# Patient Record
Sex: Male | Born: 1940
Health system: Southern US, Community
[De-identification: ages and names within clinical notes are randomized; demographics above are authoritative.]

## PROBLEM LIST (undated history)

## (undated) DIAGNOSIS — M1990 Unspecified osteoarthritis, unspecified site: Secondary | ICD-10-CM

## (undated) DIAGNOSIS — N200 Calculus of kidney: Secondary | ICD-10-CM

## (undated) DIAGNOSIS — C801 Malignant (primary) neoplasm, unspecified: Secondary | ICD-10-CM

## (undated) DIAGNOSIS — R112 Nausea with vomiting, unspecified: Secondary | ICD-10-CM

## (undated) DIAGNOSIS — I639 Cerebral infarction, unspecified: Secondary | ICD-10-CM

## (undated) DIAGNOSIS — E789 Disorder of lipoprotein metabolism, unspecified: Secondary | ICD-10-CM

## (undated) DIAGNOSIS — R4701 Aphasia: Secondary | ICD-10-CM

## (undated) DIAGNOSIS — Z9889 Other specified postprocedural states: Secondary | ICD-10-CM

## (undated) HISTORY — PX: EYE SURGERY: SHX253

---

## 2003-07-16 ENCOUNTER — Ambulatory Visit (HOSPITAL_COMMUNITY): Admission: RE | Admit: 2003-07-16 | Discharge: 2003-07-16 | Payer: Self-pay | Admitting: Gastroenterology

## 2004-12-30 ENCOUNTER — Ambulatory Visit (HOSPITAL_COMMUNITY): Admission: RE | Admit: 2004-12-30 | Discharge: 2004-12-30 | Payer: Self-pay | Admitting: Neurosurgery

## 2009-11-25 ENCOUNTER — Encounter: Admission: RE | Admit: 2009-11-25 | Discharge: 2009-11-25 | Payer: Self-pay | Admitting: Family Medicine

## 2010-09-18 NOTE — Op Note (Signed)
NAME:  Antonio Manning, Antonio Manning                            ACCOUNT NO.:  [id number]   MEDICAL RECORD NO.:  [id number]                   PATIENT TYPE:  AMB   LOCATION:  ENDO                                 FACILITY:  [facility]   PHYSICIAN:  John C. Madilyn Fireman, M.D.                 DATE OF BIRTH:  1940-05-26   DATE OF PROCEDURE:  07/16/2003  DATE OF DISCHARGE:                                 OPERATIVE REPORT   PROCEDURE:  Colonoscopy.   INDICATIONS FOR PROCEDURE:  History of adenomatous colon polyps, due for  surveillance with last colonoscopy in 1998.   DESCRIPTION OF PROCEDURE:  The patient was placed in the left lateral  decubitus position and placed on the pulse monitor with continuous low-flow  oxygen delivered by nasal cannula.  He was sedated with 50 mcg IV fentanyl  and 6 mg IV Versed.  The Olympus video colonoscope was inserted into the  rectum and advanced to the cecum, confirmed by transillumination of  McBurney's point and visualization of the ileocecal valve and appendiceal  orifice.  Prep was excellent.  The cecum, ascending, transverse, descending,  and sigmoid colon all appeared normal with no masses, polyps, diverticula,  or other mucosal abnormalities.  The rectum likewise appeared normal and  retroflexed view of the anus revealed no obvious internal hemorrhoids.  The  scope was then withdrawn and the patient returned to the recovery room in  stable condition.  He tolerated the procedure well and there were no  immediate complications.   IMPRESSION:  Normal colonoscopy.   PLAN:  Repeat next surveillance colonoscopy in five years.                                               John C. Madilyn Fireman, M.D.    JCH/MEDQ  D:  07/16/2003  T:  07/16/2003  Job:  [id number]   cc:   Caryn Bee Manning. Little, M.D.  [address]  [place]  [place] [address]  Fax: [phone]

## 2010-12-09 ENCOUNTER — Ambulatory Visit: Payer: Medicare Other | Attending: Orthopedic Surgery | Admitting: Physical Therapy

## 2010-12-09 DIAGNOSIS — M545 Low back pain, unspecified: Secondary | ICD-10-CM | POA: Insufficient documentation

## 2010-12-09 DIAGNOSIS — M25559 Pain in unspecified hip: Secondary | ICD-10-CM | POA: Insufficient documentation

## 2010-12-09 DIAGNOSIS — M25659 Stiffness of unspecified hip, not elsewhere classified: Secondary | ICD-10-CM | POA: Insufficient documentation

## 2010-12-09 DIAGNOSIS — IMO0001 Reserved for inherently not codable concepts without codable children: Secondary | ICD-10-CM | POA: Insufficient documentation

## 2010-12-09 DIAGNOSIS — R262 Difficulty in walking, not elsewhere classified: Secondary | ICD-10-CM | POA: Insufficient documentation

## 2010-12-15 ENCOUNTER — Encounter: Payer: Self-pay | Admitting: Physical Therapy

## 2010-12-18 ENCOUNTER — Ambulatory Visit: Payer: Medicare Other | Admitting: Physical Therapy

## 2010-12-22 ENCOUNTER — Ambulatory Visit: Payer: Medicare Other | Admitting: Physical Therapy

## 2010-12-24 ENCOUNTER — Ambulatory Visit: Payer: Medicare Other | Admitting: Physical Therapy

## 2010-12-29 ENCOUNTER — Ambulatory Visit: Payer: Medicare Other | Admitting: Physical Therapy

## 2011-01-01 ENCOUNTER — Encounter: Payer: Self-pay | Admitting: Physical Therapy

## 2011-01-05 ENCOUNTER — Encounter: Payer: Self-pay | Admitting: Physical Therapy

## 2011-01-08 ENCOUNTER — Encounter: Payer: Self-pay | Admitting: Physical Therapy

## 2011-05-05 ENCOUNTER — Encounter (HOSPITAL_COMMUNITY): Payer: Self-pay | Admitting: Pharmacy Technician

## 2011-05-13 ENCOUNTER — Encounter (HOSPITAL_COMMUNITY): Payer: Self-pay

## 2011-05-13 ENCOUNTER — Encounter (HOSPITAL_COMMUNITY)
Admission: RE | Admit: 2011-05-13 | Discharge: 2011-05-13 | Disposition: A | Discharge status: Home / Self Care | Payer: Medicare Other | Source: Ambulatory Visit | Attending: Orthopedic Surgery | Admitting: Orthopedic Surgery

## 2011-05-13 DIAGNOSIS — M169 Osteoarthritis of hip, unspecified: Secondary | ICD-10-CM | POA: Diagnosis not present

## 2011-05-13 DIAGNOSIS — N200 Calculus of kidney: Secondary | ICD-10-CM

## 2011-05-13 DIAGNOSIS — C801 Malignant (primary) neoplasm, unspecified: Secondary | ICD-10-CM

## 2011-05-13 DIAGNOSIS — M25559 Pain in unspecified hip: Secondary | ICD-10-CM | POA: Diagnosis not present

## 2011-05-13 DIAGNOSIS — M1990 Unspecified osteoarthritis, unspecified site: Secondary | ICD-10-CM

## 2011-05-13 DIAGNOSIS — R112 Nausea with vomiting, unspecified: Secondary | ICD-10-CM

## 2011-05-13 DIAGNOSIS — E789 Disorder of lipoprotein metabolism, unspecified: Secondary | ICD-10-CM

## 2011-05-13 HISTORY — DX: Nausea with vomiting, unspecified: R11.2

## 2011-05-13 HISTORY — DX: Malignant (primary) neoplasm, unspecified: C80.1

## 2011-05-13 HISTORY — DX: Unspecified osteoarthritis, unspecified site: M19.90

## 2011-05-13 HISTORY — PX: BACK SURGERY: SHX140

## 2011-05-13 HISTORY — DX: Calculus of kidney: N20.0

## 2011-05-13 HISTORY — DX: Disorder of lipoprotein metabolism, unspecified: E78.9

## 2011-05-13 HISTORY — DX: Other specified postprocedural states: Z98.890

## 2011-05-13 HISTORY — PX: CATARACT EXTRACTION W/ INTRAOCULAR LENS IMPLANT: SHX1309

## 2011-05-13 LAB — DIFFERENTIAL
Basophils Absolute: 0 10*3/uL (ref 0.0–0.1)
Basophils Relative: 0 % (ref 0–1)
Eosinophils Absolute: 0.1 10*3/uL (ref 0.0–0.7)
Eosinophils Relative: 2 % (ref 0–5)
Lymphocytes Relative: 28 % (ref 12–46)

## 2011-05-13 LAB — APTT: aPTT: 32 seconds (ref 24–37)

## 2011-05-13 LAB — SURGICAL PCR SCREEN
MRSA, PCR: NEGATIVE
Staphylococcus aureus: POSITIVE — AB

## 2011-05-13 LAB — URINALYSIS, ROUTINE W REFLEX MICROSCOPIC
Bilirubin Urine: NEGATIVE
Glucose, UA: NEGATIVE mg/dL
Hgb urine dipstick: NEGATIVE
Ketones, ur: NEGATIVE mg/dL
Protein, ur: NEGATIVE mg/dL
Urobilinogen, UA: 0.2 mg/dL (ref 0.0–1.0)

## 2011-05-13 LAB — CBC
MCHC: 33.3 g/dL (ref 30.0–36.0)
MCV: 89.1 fL (ref 78.0–100.0)
Platelets: 258 10*3/uL (ref 150–400)
RDW: 13.2 % (ref 11.5–15.5)
WBC: 4.5 10*3/uL (ref 4.0–10.5)

## 2011-05-13 LAB — BASIC METABOLIC PANEL
CO2: 27 mEq/L (ref 19–32)
Calcium: 9.7 mg/dL (ref 8.4–10.5)
Glucose, Bld: 106 mg/dL — ABNORMAL HIGH (ref 70–99)

## 2011-05-13 LAB — PROTIME-INR: Prothrombin Time: 13 seconds (ref 11.6–15.2)

## 2011-05-13 MED ORDER — CHLORHEXIDINE GLUCONATE 4 % EX LIQD: 60.0000 mL | Freq: Once | CUTANEOUS | Status: DC

## 2011-05-13 NOTE — Patient Instructions (Signed)
20 Antonio Manning  05/13/2011   Your procedure is scheduled on:  05-20-11  Report to [facility] at  0515 AM.  Call this number if you have problems the morning of surgery: [phone]   Remember:   Do not eat food:After Midnight.  May have clear liquids:until Midnight .  Clear liquids include soda, tea, black coffee, apple or grape juice, broth.  Take these medicines the morning of surgery with A SIP OF WATER: none  Do not wear jewelry, make-up or nail polish.  Do not wear lotions, powders, or perfumes. You may wear deodorant.  Do not shave 48 hours prior to surgery.  Do not bring valuables to the hospital.  Contacts, dentures or bridgework may not be worn into surgery.  Leave suitcase in the car. After surgery it may be brought to your room.  For patients admitted to the hospital, checkout time is 11:00 AM the day of discharge.   Patients discharged the day of surgery will not be allowed to drive home.  Name and phone number of your driver: Antonio Manning ZOXW,[phone]  Special Instructions: Incentive Spirometry - Practice and bring it with you on the day of surgery. and CHG Shower Use Special Wash: 1/2 bottle night before surgery and 1/2 bottle morning of surgery.   Please read over the following fact sheets that you were given: Blood Transfusion Information and MRSA Information

## 2011-05-13 NOTE — Pre-Procedure Instructions (Signed)
05-13-11 EKG 05-06-11 with chart, surgical clearance note with chart-Dr. Kirtland Bouchard. Little.

## 2011-05-13 NOTE — H&P (Signed)
Antonio Manning is an 71 y.o. male.    Chief Complaint: left hip OA and pain   HPI: Pt is a 71 y.o. male complaining of left hip pain for a couple of years years. Pain had continually increased since the beginning. X-rays in the clinic show end-stage arthritic changes of the left hip. Pt has tried various conservative treatments which have failed to alleviate their symptoms including PT and NSAIDs. Various options are discussed with the patient. Risks, benefits and expectations were discussed with the patient. Patient understand the risks, benefits and expectations and wishes to proceed with surgery.   PCP:  Dr. Catha Gosselin  D/C Plans:  Home with HHPT  Post-op Meds:  Rx given for  ASA, Robaxin, Celebrex, Iron, Colace and MiraLax  Tranexamic Acid:  To be given  PMH: Past Medical History  Diagnosis Date  . Cholesterol serum elevated 2011-05-23    tx. Lipitor  . Cataract May 23, 2011    right  . PONV (postoperative nausea and vomiting) May 23, 2011    with cataract surgery  . Kidney calculi 05/23/2011    past hx. x1-passed on own, mild prostate issues-no meds  . Arthritis 05-23-2011    osteoarthritis,lt. hip, and neck area  . Cancer 05-23-2011    skin cancer lesions-multiple  . Hearing loss 05/23/11    wears hearing aids bilaterally    PSH: Past Surgical History  Procedure Date  . Back surgery 23-May-2011    Lumbar disc surgery-no hardware  . Cataract extraction w/ intraocular lens implant 2011-05-23    left eye    Social History:  reports that he quit smoking about 23 years ago. His smoking use included Cigarettes. He does not have any smokeless tobacco history on file. He reports that he does not drink alcohol or use illicit drugs.  Allergies:  Allergies  Allergen Reactions  . Codeine Nausea And Vomiting    Medications: 1. Lipitor 10 mg one PO daily 2. Celebrex 200 mg one PO daily 3. Aspirin 81 mg one PO daily 4. Multivitamin one PO daily 5. Calcium +D one PO daily 6. Glucosamine and  chondroitin one PO daily     Results for orders placed during the hospital encounter of 2011-05-23 (from the past 48 hour(s))  SURGICAL PCR SCREEN     Status: Abnormal   Collection Time   05-23-11  9:29 AM      Component Value Range Comment   MRSA, PCR NEGATIVE  NEGATIVE     Staphylococcus aureus POSITIVE (*) NEGATIVE    URINALYSIS, ROUTINE W REFLEX MICROSCOPIC     Status: Normal   Collection Time   2011/05/23  9:30 AM      Component Value Range Comment   Color, Urine YELLOW  YELLOW     APPearance CLEAR  CLEAR     Specific Gravity, Urine 1.021  1.005 - 1.030     pH 5.0  5.0 - 8.0     Glucose, UA NEGATIVE  NEGATIVE (mg/dL)    Hgb urine dipstick NEGATIVE  NEGATIVE     Bilirubin Urine NEGATIVE  NEGATIVE     Ketones, ur NEGATIVE  NEGATIVE (mg/dL)    Protein, ur NEGATIVE  NEGATIVE (mg/dL)    Urobilinogen, UA 0.2  0.0 - 1.0 (mg/dL)    Nitrite NEGATIVE  NEGATIVE     Leukocytes, UA NEGATIVE  NEGATIVE  MICROSCOPIC NOT DONE ON URINES WITH NEGATIVE PROTEIN, BLOOD, LEUKOCYTES, NITRITE, OR GLUCOSE <1000 mg/dL.  APTT     Status: Normal  Collection Time   05/13/11 10:10 AM      Component Value Range Comment   aPTT 32  24 - 37 (seconds)   BASIC METABOLIC PANEL     Status: Abnormal   Collection Time   05/13/11 10:10 AM      Component Value Range Comment   Sodium 139  135 - 145 (mEq/L)    Potassium 4.4  3.5 - 5.1 (mEq/L)    Chloride 104  96 - 112 (mEq/L)    CO2 27  19 - 32 (mEq/L)    Glucose, Bld 106 (*) 70 - 99 (mg/dL)    BUN 18  6 - 23 (mg/dL)    Creatinine, Ser 9.60  0.50 - 1.35 (mg/dL)    Calcium 9.7  8.4 - 10.5 (mg/dL)    GFR calc non Af Amer 87 (*) >90 (mL/min)    GFR calc Af Amer >90  >90 (mL/min)   CBC     Status: Normal   Collection Time   05/13/11 10:10 AM      Component Value Range Comment   WBC 4.5  4.0 - 10.5 (K/uL)    RBC 4.59  4.22 - 5.81 (MIL/uL)    Hemoglobin 13.6  13.0 - 17.0 (g/dL)    HCT 45.4  09.8 - 11.9 (%)    MCV 89.1  78.0 - 100.0 (fL)    MCH 29.6  26.0 - 34.0  (pg)    MCHC 33.3  30.0 - 36.0 (g/dL)    RDW 14.7  82.9 - 56.2 (%)    Platelets 258  150 - 400 (K/uL)   DIFFERENTIAL     Status: Normal   Collection Time   05/13/11 10:10 AM      Component Value Range Comment   Neutrophils Relative 62  43 - 77 (%)    Neutro Abs 2.8  1.7 - 7.7 (K/uL)    Lymphocytes Relative 28  12 - 46 (%)    Lymphs Abs 1.2  0.7 - 4.0 (K/uL)    Monocytes Relative 8  3 - 12 (%)    Monocytes Absolute 0.4  0.1 - 1.0 (K/uL)    Eosinophils Relative 2  0 - 5 (%)    Eosinophils Absolute 0.1  0.0 - 0.7 (K/uL)    Basophils Relative 0  0 - 1 (%)    Basophils Absolute 0.0  0.0 - 0.1 (K/uL)   PROTIME-INR     Status: Normal   Collection Time   05/13/11 10:10 AM      Component Value Range Comment   Prothrombin Time 13.0  11.6 - 15.2 (seconds)    INR 0.96  0.00 - 1.49       ROS: Review of Systems  Constitutional: Negative.   HENT: Positive for hearing loss.   Eyes: Negative.   Respiratory: Positive for shortness of breath (on exertion).   Cardiovascular: Negative.   Gastrointestinal: Positive for heartburn and diarrhea.  Genitourinary: Positive for urgency and frequency.  Musculoskeletal: Positive for myalgias, back pain and joint pain. Negative for falls.  Skin: Negative.   Neurological: Positive for dizziness and headaches.  Endo/Heme/Allergies: Negative.   Psychiatric/Behavioral: Negative.      Physican Exam: BP : 130/84 ; Pulse : 60 ; Resp : 14 ; Physical Exam  Constitutional: He is oriented to person, place, and time and well-developed, well-nourished, and in no distress.  HENT:  Head: Normocephalic and atraumatic.  Nose: Nose normal.  Mouth/Throat: Oropharynx is clear and moist.  Eyes: Pupils  are equal, round, and reactive to light.  Neck: Neck supple. No JVD present. No tracheal deviation present. No thyromegaly present.  Cardiovascular: Normal rate, regular rhythm, normal heart sounds and intact distal pulses.   Pulmonary/Chest: Effort normal and breath  sounds normal. No stridor. No respiratory distress. He has no wheezes. He has no rales. He exhibits no tenderness.  Abdominal: Soft. There is no tenderness. There is no guarding.  Musculoskeletal:       Right shoulder: He exhibits decreased range of motion (mild decrease with flexion and abduction; significant decrease with internal and external rotation), bony tenderness, pain and decreased strength. He exhibits no tenderness, no swelling, no effusion, no deformity, no laceration and normal pulse.  Lymphadenopathy:    He has no cervical adenopathy.  Neurological: He is alert and oriented to person, place, and time.  Skin: Skin is warm and dry. No rash noted. No erythema. No pallor.  Psychiatric: Affect normal.      Assessment/Plan Assessment: left hip OA and pain   Plan: Patient will undergo a left total hip arthroplasty, anterior approach on 05/20/2011. Risks benefits and expectation were discussed with the patient. Patient understand risks, benefits and expectation and wishes to proceed.   Anastasio Auerbach Chesney Suares   PAC  05/13/2011, 4:37 PM

## 2011-05-20 ENCOUNTER — Encounter (HOSPITAL_COMMUNITY)
Admission: RE | Disposition: A | Discharge status: Home Health | Payer: Self-pay | Source: Ambulatory Visit | Attending: Orthopedic Surgery

## 2011-05-20 ENCOUNTER — Inpatient Hospital Stay (HOSPITAL_COMMUNITY)
Admission: RE | Admit: 2011-05-20 | Discharge: 2011-05-22 | DRG: 470 | Disposition: A | Discharge status: Home Health | Payer: Medicare Other | Source: Ambulatory Visit | Attending: Orthopedic Surgery | Admitting: Orthopedic Surgery

## 2011-05-20 ENCOUNTER — Inpatient Hospital Stay (HOSPITAL_COMMUNITY): Payer: Medicare Other | Admitting: Anesthesiology

## 2011-05-20 ENCOUNTER — Encounter (HOSPITAL_COMMUNITY): Payer: Self-pay

## 2011-05-20 ENCOUNTER — Inpatient Hospital Stay (HOSPITAL_COMMUNITY): Payer: Medicare Other

## 2011-05-20 ENCOUNTER — Encounter (HOSPITAL_COMMUNITY): Payer: Self-pay | Admitting: Anesthesiology

## 2011-05-20 DIAGNOSIS — M25559 Pain in unspecified hip: Secondary | ICD-10-CM | POA: Diagnosis not present

## 2011-05-20 DIAGNOSIS — E7800 Pure hypercholesterolemia, unspecified: Secondary | ICD-10-CM | POA: Diagnosis not present

## 2011-05-20 DIAGNOSIS — M169 Osteoarthritis of hip, unspecified: Secondary | ICD-10-CM | POA: Diagnosis not present

## 2011-05-20 DIAGNOSIS — M1610 Unilateral primary osteoarthritis, unspecified hip: Principal | ICD-10-CM | POA: Diagnosis present

## 2011-05-20 DIAGNOSIS — Z01812 Encounter for preprocedural laboratory examination: Secondary | ICD-10-CM | POA: Diagnosis not present

## 2011-05-20 DIAGNOSIS — Z471 Aftercare following joint replacement surgery: Secondary | ICD-10-CM | POA: Diagnosis not present

## 2011-05-20 DIAGNOSIS — Z96649 Presence of unspecified artificial hip joint: Secondary | ICD-10-CM | POA: Diagnosis not present

## 2011-05-20 HISTORY — PX: TOTAL HIP ARTHROPLASTY: SHX124

## 2011-05-20 LAB — ABO/RH: ABO/RH(D): A POS

## 2011-05-20 SURGERY — ARTHROPLASTY, HIP, TOTAL, ANTERIOR APPROACH
Anesthesia: General | Site: Hip | Laterality: Left | Wound class: Clean

## 2011-05-20 MED ORDER — RIVAROXABAN 10 MG PO TABS
10.0000 mg | ORAL_TABLET | Freq: Every day | ORAL | Status: DC
  Administered 2011-05-21 – 2011-05-22 (×2): 10 mg via ORAL
  Filled 2011-05-20 (×2): qty 1

## 2011-05-20 MED ORDER — TRANEXAMIC ACID 100 MG/ML IV SOLN
1110.0000 mg | Freq: Once | INTRAVENOUS | Status: AC
  Administered 2011-05-20: 1110 mg via INTRAVENOUS
  Filled 2011-05-20: qty 11.1

## 2011-05-20 MED ORDER — GLYCOPYRROLATE 0.2 MG/ML IJ SOLN
INTRAMUSCULAR | Status: DC | PRN
  Administered 2011-05-20: .8 mg via INTRAVENOUS

## 2011-05-20 MED ORDER — NEOSTIGMINE METHYLSULFATE 1 MG/ML IJ SOLN
INTRAMUSCULAR | Status: DC | PRN
  Administered 2011-05-20: 5 mg via INTRAVENOUS

## 2011-05-20 MED ORDER — METOCLOPRAMIDE HCL 5 MG/ML IJ SOLN
5.0000 mg | Freq: Three times a day (TID) | INTRAMUSCULAR | Status: DC | PRN
  Administered 2011-05-20: 10 mg via INTRAVENOUS

## 2011-05-20 MED ORDER — PHENOL 1.4 % MT LIQD
1.0000 | OROMUCOSAL | Status: DC | PRN
  Filled 2011-05-20: qty 177

## 2011-05-20 MED ORDER — ACETAMINOPHEN 650 MG RE SUPP: 650.0000 mg | Freq: Four times a day (QID) | RECTAL | Status: DC | PRN

## 2011-05-20 MED ORDER — ACETAMINOPHEN 325 MG PO TABS
650.0000 mg | ORAL_TABLET | Freq: Four times a day (QID) | ORAL | Status: DC | PRN
  Administered 2011-05-21 (×2): 650 mg via ORAL
  Filled 2011-05-20 (×2): qty 2

## 2011-05-20 MED ORDER — SIMVASTATIN 20 MG PO TABS
20.0000 mg | ORAL_TABLET | Freq: Every day | ORAL | Status: DC
  Administered 2011-05-21 – 2011-05-22 (×2): 20 mg via ORAL
  Filled 2011-05-20 (×3): qty 1

## 2011-05-20 MED ORDER — POLYETHYLENE GLYCOL 3350 17 G PO PACK
17.0000 g | PACK | Freq: Every day | ORAL | Status: DC
  Administered 2011-05-22: 17 g via ORAL
  Filled 2011-05-20 (×3): qty 1

## 2011-05-20 MED ORDER — FENTANYL CITRATE 0.05 MG/ML IJ SOLN
INTRAMUSCULAR | Status: DC | PRN
  Administered 2011-05-20 (×2): 100 ug via INTRAVENOUS
  Administered 2011-05-20: 50 ug via INTRAVENOUS

## 2011-05-20 MED ORDER — SCOPOLAMINE 1 MG/3DAYS TD PT72
MEDICATED_PATCH | TRANSDERMAL | Status: DC | PRN
  Administered 2011-05-20: 1.5 mg via TRANSDERMAL

## 2011-05-20 MED ORDER — CALCIUM CARBONATE-VITAMIN D 500-200 MG-UNIT PO TABS
1.0000 | ORAL_TABLET | Freq: Two times a day (BID) | ORAL | Status: DC
  Administered 2011-05-20 – 2011-05-22 (×4): 1 via ORAL
  Filled 2011-05-20 (×6): qty 1

## 2011-05-20 MED ORDER — PROPOFOL 10 MG/ML IV EMUL
INTRAVENOUS | Status: DC | PRN
  Administered 2011-05-20: 150 mg via INTRAVENOUS

## 2011-05-20 MED ORDER — CEFAZOLIN SODIUM 1-5 GM-% IV SOLN
1.0000 g | INTRAVENOUS | Status: AC
  Administered 2011-05-20: 1 g via INTRAVENOUS

## 2011-05-20 MED ORDER — CEFAZOLIN SODIUM 1-5 GM-% IV SOLN
1.0000 g | Freq: Four times a day (QID) | INTRAVENOUS | Status: AC
  Administered 2011-05-20 – 2011-05-21 (×3): 1 g via INTRAVENOUS
  Filled 2011-05-20 (×3): qty 50

## 2011-05-20 MED ORDER — HYDROCODONE-ACETAMINOPHEN 5-325 MG PO TABS
1.0000 | ORAL_TABLET | ORAL | Status: DC | PRN
  Administered 2011-05-20 (×2): 1 via ORAL
  Filled 2011-05-20: qty 2
  Filled 2011-05-20: qty 1

## 2011-05-20 MED ORDER — ACETAMINOPHEN 10 MG/ML IV SOLN
INTRAVENOUS | Status: DC | PRN
  Administered 2011-05-20: 1000 mg via INTRAVENOUS

## 2011-05-20 MED ORDER — ALUM & MAG HYDROXIDE-SIMETH 200-200-20 MG/5ML PO SUSP
30.0000 mL | ORAL | Status: DC | PRN
  Administered 2011-05-21: 30 mL via ORAL
  Filled 2011-05-20: qty 30

## 2011-05-20 MED ORDER — ROCURONIUM BROMIDE 100 MG/10ML IV SOLN
INTRAVENOUS | Status: DC | PRN
  Administered 2011-05-20: 50 mg via INTRAVENOUS
  Administered 2011-05-20: 20 mg via INTRAVENOUS
  Administered 2011-05-20: 10 mg via INTRAVENOUS

## 2011-05-20 MED ORDER — LIDOCAINE HCL (CARDIAC) 20 MG/ML IV SOLN
INTRAVENOUS | Status: DC | PRN
  Administered 2011-05-20: 100 mg via INTRAVENOUS

## 2011-05-20 MED ORDER — HYDROMORPHONE HCL PF 1 MG/ML IJ SOLN
INTRAMUSCULAR | Status: DC | PRN
Start: 2011-05-20 — End: 2011-05-20
  Administered 2011-05-20 (×2): 1 mg via INTRAVENOUS

## 2011-05-20 MED ORDER — PROMETHAZINE HCL 25 MG/ML IJ SOLN: 6.2500 mg | INTRAMUSCULAR | Status: DC | PRN

## 2011-05-20 MED ORDER — ADULT MULTIVITAMIN W/MINERALS CH
1.0000 | ORAL_TABLET | Freq: Every day | ORAL | Status: DC
  Administered 2011-05-21 – 2011-05-22 (×2): 1 via ORAL
  Filled 2011-05-20 (×3): qty 1

## 2011-05-20 MED ORDER — DEXAMETHASONE SODIUM PHOSPHATE 10 MG/ML IJ SOLN: 10.0000 mg | Freq: Once | INTRAMUSCULAR | Status: DC

## 2011-05-20 MED ORDER — METHOCARBAMOL 100 MG/ML IJ SOLN
500.0000 mg | Freq: Four times a day (QID) | INTRAMUSCULAR | Status: DC | PRN
  Administered 2011-05-20: 500 mg via INTRAVENOUS
  Filled 2011-05-20 (×2): qty 5

## 2011-05-20 MED ORDER — ASPIRIN EC 81 MG PO TBEC
81.0000 mg | DELAYED_RELEASE_TABLET | Freq: Every day | ORAL | Status: DC
  Administered 2011-05-20 – 2011-05-21 (×2): 81 mg via ORAL
  Filled 2011-05-20 (×3): qty 1

## 2011-05-20 MED ORDER — LACTATED RINGERS IV SOLN: INTRAVENOUS | Status: DC

## 2011-05-20 MED ORDER — HETASTARCH-ELECTROLYTES 6 % IV SOLN
INTRAVENOUS | Status: DC | PRN
  Administered 2011-05-20: 09:00:00 via INTRAVENOUS

## 2011-05-20 MED ORDER — ONDANSETRON HCL 4 MG/2ML IJ SOLN
INTRAMUSCULAR | Status: DC | PRN
  Administered 2011-05-20: 4 mg via INTRAVENOUS

## 2011-05-20 MED ORDER — HYDROMORPHONE HCL PF 1 MG/ML IJ SOLN
0.2500 mg | INTRAMUSCULAR | Status: DC | PRN
  Administered 2011-05-20 (×3): 0.25 mg via INTRAVENOUS

## 2011-05-20 MED ORDER — ONDANSETRON HCL 4 MG PO TABS
4.0000 mg | ORAL_TABLET | Freq: Four times a day (QID) | ORAL | Status: DC | PRN
  Administered 2011-05-22: 4 mg via ORAL
  Filled 2011-05-20: qty 1

## 2011-05-20 MED ORDER — METHOCARBAMOL 500 MG PO TABS
500.0000 mg | ORAL_TABLET | Freq: Four times a day (QID) | ORAL | Status: DC | PRN
  Administered 2011-05-20: 500 mg via ORAL
  Filled 2011-05-20: qty 1

## 2011-05-20 MED ORDER — MENTHOL 3 MG MT LOZG
1.0000 | LOZENGE | OROMUCOSAL | Status: DC | PRN
  Filled 2011-05-20: qty 9

## 2011-05-20 MED ORDER — METOCLOPRAMIDE HCL 5 MG PO TABS
5.0000 mg | ORAL_TABLET | Freq: Three times a day (TID) | ORAL | Status: DC | PRN
  Filled 2011-05-20: qty 2

## 2011-05-20 MED ORDER — SODIUM CHLORIDE 0.9 % IV SOLN
INTRAVENOUS | Status: DC
  Administered 2011-05-20: 22:00:00 via INTRAVENOUS
  Filled 2011-05-20 (×7): qty 1000

## 2011-05-20 MED ORDER — FERROUS SULFATE 325 (65 FE) MG PO TABS
325.0000 mg | ORAL_TABLET | Freq: Three times a day (TID) | ORAL | Status: DC
  Administered 2011-05-21 – 2011-05-22 (×4): 325 mg via ORAL
  Filled 2011-05-20 (×8): qty 1

## 2011-05-20 MED ORDER — MIDAZOLAM HCL 5 MG/5ML IJ SOLN
INTRAMUSCULAR | Status: DC | PRN
  Administered 2011-05-20: 2 mg via INTRAVENOUS

## 2011-05-20 MED ORDER — DOCUSATE SODIUM 100 MG PO CAPS
100.0000 mg | ORAL_CAPSULE | Freq: Two times a day (BID) | ORAL | Status: DC
  Administered 2011-05-20 – 2011-05-22 (×4): 100 mg via ORAL
  Filled 2011-05-20 (×6): qty 1

## 2011-05-20 MED ORDER — METOCLOPRAMIDE HCL 5 MG/ML IJ SOLN
INTRAMUSCULAR | Status: AC
  Filled 2011-05-20: qty 2

## 2011-05-20 MED ORDER — ONDANSETRON HCL 4 MG/2ML IJ SOLN
4.0000 mg | Freq: Four times a day (QID) | INTRAMUSCULAR | Status: DC | PRN
  Administered 2011-05-21: 4 mg via INTRAVENOUS
  Filled 2011-05-20: qty 2

## 2011-05-20 MED ORDER — LACTATED RINGERS IV SOLN
INTRAVENOUS | Status: DC | PRN
  Administered 2011-05-20 (×2): via INTRAVENOUS

## 2011-05-20 MED ORDER — DEXAMETHASONE SODIUM PHOSPHATE 10 MG/ML IJ SOLN
INTRAMUSCULAR | Status: DC | PRN
  Administered 2011-05-20: 10 mg via INTRAVENOUS

## 2011-05-20 SURGICAL SUPPLY — 42 items
ADH SKN CLS APL DERMABOND .7 (GAUZE/BANDAGES/DRESSINGS) ×1
BAG SPEC THK2 15X12 ZIP CLS (MISCELLANEOUS) ×1
BAG ZIPLOCK 12X15 (MISCELLANEOUS) ×3 IMPLANT
BLADE SAW SGTL 18X1.27X75 (BLADE) ×2 IMPLANT
CELLS DAT CNTRL 66122 CELL SVR (MISCELLANEOUS) ×1 IMPLANT
CLOTH BEACON ORANGE TIMEOUT ST (SAFETY) ×2 IMPLANT
DERMABOND ADVANCED (GAUZE/BANDAGES/DRESSINGS) ×1
DERMABOND ADVANCED .7 DNX12 (GAUZE/BANDAGES/DRESSINGS) ×1 IMPLANT
DRAPE C-ARM 42X72 X-RAY (DRAPES) ×2 IMPLANT
DRAPE STERI IOBAN 125X83 (DRAPES) ×2 IMPLANT
DRAPE U-SHAPE 47X51 STRL (DRAPES) ×6 IMPLANT
DRSG AQUACEL AG ADV 3.5X10 (GAUZE/BANDAGES/DRESSINGS) ×2 IMPLANT
DRSG TEGADERM 4X4.75 (GAUZE/BANDAGES/DRESSINGS) ×1 IMPLANT
DURAPREP 26ML APPLICATOR (WOUND CARE) ×2 IMPLANT
ELECT BLADE TIP CTD 4 INCH (ELECTRODE) ×2 IMPLANT
ELECT REM PT RETURN 9FT ADLT (ELECTROSURGICAL) ×2
ELECTRODE REM PT RTRN 9FT ADLT (ELECTROSURGICAL) ×1 IMPLANT
EVACUATOR 1/8 PVC DRAIN (DRAIN) ×1 IMPLANT
FACESHIELD LNG OPTICON STERILE (SAFETY) ×8 IMPLANT
GAUZE SPONGE 2X2 8PLY STRL LF (GAUZE/BANDAGES/DRESSINGS) ×1 IMPLANT
GLOVE BIOGEL PI IND STRL 7.5 (GLOVE) ×1 IMPLANT
GLOVE BIOGEL PI IND STRL 8 (GLOVE) ×1 IMPLANT
GLOVE BIOGEL PI INDICATOR 7.5 (GLOVE) ×1
GLOVE BIOGEL PI INDICATOR 8 (GLOVE) ×1
GLOVE ECLIPSE 8.0 STRL XLNG CF (GLOVE) ×2 IMPLANT
GLOVE ORTHO TXT STRL SZ7.5 (GLOVE) ×4 IMPLANT
GOWN STRL NON-REIN LRG LVL3 (GOWN DISPOSABLE) ×2 IMPLANT
IMMOBILIZER KNEE 20 (SOFTGOODS) ×2
IMMOBILIZER KNEE 20 THIGH 36 (SOFTGOODS) IMPLANT
KIT BASIN OR (CUSTOM PROCEDURE TRAY) ×2 IMPLANT
PACK TOTAL JOINT (CUSTOM PROCEDURE TRAY) ×2 IMPLANT
PADDING CAST COTTON 6X4 STRL (CAST SUPPLIES) ×2 IMPLANT
RETRACTOR WND ALEXIS 18 MED (MISCELLANEOUS) ×1 IMPLANT
RTRCTR WOUND ALEXIS 18CM MED (MISCELLANEOUS) ×2
SPONGE GAUZE 2X2 STER 10/PKG (GAUZE/BANDAGES/DRESSINGS) ×1
SUCTION FRAZIER 12FR DISP (SUCTIONS) ×2 IMPLANT
SUT MNCRL AB 4-0 PS2 18 (SUTURE) ×2 IMPLANT
SUT VIC AB 1 CT1 36 (SUTURE) ×9 IMPLANT
SUT VIC AB 2-0 CT1 27 (SUTURE) ×4
SUT VIC AB 2-0 CT1 TAPERPNT 27 (SUTURE) ×2 IMPLANT
TOWEL OR 17X26 10 PK STRL BLUE (TOWEL DISPOSABLE) ×4 IMPLANT
TRAY FOLEY CATH 14FRSI W/METER (CATHETERS) ×2 IMPLANT

## 2011-05-20 NOTE — Op Note (Signed)
NAME:  JOSEL KEO                ACCOUNT NO.: [id number]      MEDICAL RECORD NO.: [id number]      FACILITY:  [facility]      PHYSICIAN:  Durene Romans D  DATE OF BIRTH:  [birth year]     DATE OF PROCEDURE:  05/20/2011                                 OPERATIVE REPORT         PREOPERATIVE DIAGNOSIS: Left  hip osteoarthritis.      POSTOPERATIVE DIAGNOSIS:  Left osteoarthritis.      PROCEDURE:  Left total hip replacement through an anterior approach   utilizing DePuy THR system, component size 58 pinnacle cup, a size 36+4 neutral   Altrex liner, a size 6Hi Tri Lock stem with a 36+1.5 delta ceramic   ball.      SURGEON:  Madlyn Frankel. Charlann Boxer, M.D.      ASSISTANT:  Lanney Gins, PA      ANESTHESIA:  Regional.      SPECIMENS:  None.      COMPLICATIONS:  None.      BLOOD LOSS:  500 cc     DRAINS:  One Hemovac.      INDICATION OF THE PROCEDURE:  CAP MASSI is a 71 y.o. male who had   presented to office for evaluation of left hip pain.  Radiographs revealed   progressive degenerative changes with bone-on-bone   articulation to the  hip joint.  The patient had painful limited range of   motion significantly affecting their overall quality of life.  The patient was failing to    respond to conservative measures, and at this point was ready   to proceed with more definitive measures.  The patient has noted progressive   degenerative changes in his hip, progressive problems and dysfunction   with regarding the hip prior to surgery.  Consent was obtained for   benefit of pain relief.  Specific risk of infection, DVT, component   failure, dislocation, need for revision surgery, as well discussion of   the anterior versus posterior approach were reviewed.  Consent was   obtained for benefit of anterior pain relief through an anterior   approach.      PROCEDURE IN DETAIL:  The patient was brought to operative theater.   Once adequate anesthesia, preoperative antibiotics, 2 gm Ancef  administered.   The patient was positioned supine on the OSI Hanna table.  Once adequate   padding of boney process was carried out, we had predraped out the hip, and  used fluoroscopy to confirm orientation of the pelvis and position.      The left hip was then prepped and draped from proximal iliac crest to   mid thigh with shower curtain technique.      Time-out was performed identifying the patient, planned procedure, and   extremity.     An incision was then made 2 cm distal and lateral to the   anterior superior iliac spine extending over the orientation of the   tensor fascia lata muscle and sharp dissection was carried down to the   fascia of the muscle and protractor placed in the soft tissues.      The fascia was then incised.  The muscle belly was identified and swept  laterally and retractor placed along the superior neck.  Following   cauterization of the circumflex vessels and removing some pericapsular   fat, a second cobra retractor was placed on the inferior neck.  A third   retractor was placed on the anterior acetabulum after elevating the   anterior rectus.  A L-capsulotomy was along the line of the   superior neck to the trochanteric fossa, then extended proximally and   distally.  Tag sutures were placed and the retractors were then placed   intracapsular.  We then identified the trochanteric fossa and   orientation of my neck cut, confirmed this radiographically   and then made a neck osteotomy with the femur on traction.  The femoral   head was removed without difficulty or complication.  Traction was let   off and retractors were placed posterior and anterior around the   acetabulum.      The labrum and foveal tissue were debrided.  I began reaming with a 48mm   reamer and reamed up to 57mm reamer with good bony bed preparation and a 58   cup was chosen.  The final 58 Pinnacle cup was then impacted under fluoroscopy  to confirm the depth of penetration and  orientation with respect to   abduction.  A screw was placed followed by the hole eliminator.  The final   36+4 Altrex liner was impacted with good visualized rim fit.  The cup was positioned anatomically within the acetabular portion of the pelvis.      At this point, the femur was rolled at 80 degrees.  Further capsule was   released off the inferior aspect of the femoral neck.  I then   released the superior capsule proximally.  The hook was placed laterally   along the femur and elevated manually and held in position with the bed   hook.  The leg was then extended and adducted with the leg rolled to 100   degrees of external rotation.  Once the proximal femur was fully   exposed, I used a box osteotome to set orientation.  I then began   broaching with the starting chili pepper broach and passed this by hand and then broached up to 6.  With the 6 broach in place I chose a high offset neck and did a trial reduction.  The offset was appropriate, leg lengths   appeared to be equal, confirmed radiographically.   Given these findings, I went ahead and dislocated the hip, repositioned all   retractors and positioned the right hip in the extended and abducted position.  The final 6Hi  Tri Lock stem was   chosen and it was impacted down to the level of neck cut.  Based on this   and the trial reduction, a 36+1.5 delta ceramic ball was chosen and   impacted onto a clean and dry trunnion, and the hip was reduced.  The   hip had been irrigated throughout the case again at this point.  I did   reapproximate the superior capsular leaflet to the anterior leaflet   using #1 Vicryl, placed a medium Hemovac drain deep.  The fascia of the   tensor fascia lata muscle was then reapproximated using #1 Vicryl.  The   remaining wound was closed with 2-0 Vicryl and running 4-0 Monocryl.   The hip was cleaned, dried, and dressed sterilely using Dermabond and   Aquacel dressing.  Drain site dressed separately.   She was then  brought   to recovery room in stable condition tolerating the procedure well.    Lanney Gins, PA-C was present for the entirety of the case involved from   preoperative positioning, perioperative retractor management, general   facilitation of the case, as well as primary wound closure as assistant.            Madlyn Frankel Charlann Boxer, M.D.            MDO/MEDQ  D:  02/23/2011  T:  02/23/2011  Job:  [id number]      Electronically Signed by Durene Romans M.D. on 03/01/2011 09:15:38 AM

## 2011-05-20 NOTE — Anesthesia Postprocedure Evaluation (Signed)
  Anesthesia Post-op Note  Patient: Antonio Manning  Procedure(s) Performed:  TOTAL HIP ARTHROPLASTY ANTERIOR APPROACH  Patient Location: PACU  Anesthesia Type: General  Level of Consciousness: oriented and sedated  Airway and Oxygen Therapy: Patient Spontanous Breathing and Patient connected to nasal cannula oxygen  Post-op Pain: mild  Post-op Assessment: Post-op Vital signs reviewed, Patient's Cardiovascular Status Stable, Respiratory Function Stable, Patent Airway and No signs of Nausea or vomiting  Post-op Vital Signs: stable  Complications: No apparent anesthesia complications

## 2011-05-20 NOTE — Anesthesia Preprocedure Evaluation (Addendum)
Anesthesia Evaluation  Patient identified by MRN, date of birth, ID band Patient awake    Reviewed: Allergy & Precautions, H&P , NPO status , Patient's Chart, lab work & pertinent test results, reviewed documented beta blocker date and time   History of Anesthesia Complications (+) PONV  Airway Mallampati: II  Neck ROM: Full  Mouth opening: Limited Mouth Opening  Dental  (+) Upper Dentures and Partial Lower   Pulmonary neg pulmonary ROS,  clear to auscultation        Cardiovascular neg cardio ROS Regular Normal Denies cardiac symptoms Given CV clearance   Neuro/Psych Negative Neurological ROS  Negative Psych ROS   GI/Hepatic negative GI ROS, Neg liver ROS,   Endo/Other  Negative Endocrine ROS  Renal/GU negative Renal ROS  Genitourinary negative   Musculoskeletal negative musculoskeletal ROS (+)   Abdominal   Peds negative pediatric ROS (+)  Hematology negative hematology ROS (+)   Anesthesia Other Findings   Reproductive/Obstetrics negative OB ROS                          Anesthesia Physical Anesthesia Plan  ASA: I  Anesthesia Plan: General   Post-op Pain Management:    Induction: Intravenous  Airway Management Planned: Oral ETT  Additional Equipment:   Intra-op Plan:   Post-operative Plan: Extubation in OR  Informed Consent: I have reviewed the patients History and Physical, chart, labs and discussed the procedure including the risks, benefits and alternatives for the proposed anesthesia with the patient or authorized representative who has indicated his/her understanding and acceptance.   Dental advisory given  Plan Discussed with: CRNA and Surgeon  Anesthesia Plan Comments:         Anesthesia Quick Evaluation

## 2011-05-20 NOTE — Progress Notes (Signed)
X-RAY RESULTS NOTED- LEFT HIP PROSTHESIS IN ANATOMIC ALIGNMENT.

## 2011-05-20 NOTE — Preoperative (Signed)
Beta Blockers   Reason not to administer Beta Blockers:Not Applicable 

## 2011-05-20 NOTE — Progress Notes (Signed)
PORTABLE AP PELVIS AND LATERAL LEFT HIP X-RAYS DONE

## 2011-05-20 NOTE — Transfer of Care (Signed)
Immediate Anesthesia Transfer of Care Note  Patient: Antonio Manning  Procedure(s) Performed:  TOTAL HIP ARTHROPLASTY ANTERIOR APPROACH  Patient Location: PACU  Anesthesia Type: General  Level of Consciousness: awake and confused  Airway & Oxygen Therapy: Patient Spontanous Breathing and Patient connected to face mask oxygen  Post-op Assessment: Report given to PACU RN and Post -op Vital signs reviewed and stable  Post vital signs: Reviewed and stable Filed Vitals:   05/20/11 0527  BP: 136/91  Pulse: 88  Temp: 36.1 C  Resp: 20    Complications: No apparent anesthesia complications

## 2011-05-20 NOTE — Interval H&P Note (Signed)
History and Physical Interval Note:  05/20/2011 6:51 AM  Antonio Manning  has presented today for surgery, with the diagnosis of left hip osteoarthritis  The various methods of treatment have been discussed with the patient and family. After consideration of risks, benefits and other options for treatment, the patient has consented to  Procedure(s): LEFT TOTAL HIP ARTHROPLASTY ANTERIOR APPROACH as a surgical intervention .  The patients' history has been reviewed, patient examined, no change in status, stable for surgery.  I have reviewed the patients' chart and labs.  Questions were answered to the patient's satisfaction.     [name]

## 2011-05-21 DIAGNOSIS — Z96649 Presence of unspecified artificial hip joint: Secondary | ICD-10-CM

## 2011-05-21 LAB — CBC
HCT: 28.5 % — ABNORMAL LOW (ref 39.0–52.0)
Hemoglobin: 9.7 g/dL — ABNORMAL LOW (ref 13.0–17.0)
RBC: 3.21 MIL/uL — ABNORMAL LOW (ref 4.22–5.81)
RDW: 13.2 % (ref 11.5–15.5)
WBC: 10 10*3/uL (ref 4.0–10.5)

## 2011-05-21 LAB — BASIC METABOLIC PANEL
BUN: 14 mg/dL (ref 6–23)
CO2: 27 mEq/L (ref 19–32)
Chloride: 104 mEq/L (ref 96–112)
GFR calc Af Amer: >90 mL/min (ref >90)
Glucose, Bld: 109 mg/dL — ABNORMAL HIGH (ref 70–99)
Potassium: 4 mEq/L (ref 3.5–5.1)

## 2011-05-21 MED ORDER — POLYETHYLENE GLYCOL 3350 17 G PO PACK: 17.0000 g | PACK | Freq: Every day | ORAL | Status: AC

## 2011-05-21 MED ORDER — FERROUS SULFATE 325 (65 FE) MG PO TABS: 325.0000 mg | ORAL_TABLET | Freq: Three times a day (TID) | ORAL | Status: DC

## 2011-05-21 MED ORDER — ACETAMINOPHEN 325 MG PO TABS: 650.0000 mg | ORAL_TABLET | Freq: Four times a day (QID) | ORAL | Status: AC | PRN

## 2011-05-21 MED ORDER — METHOCARBAMOL 500 MG PO TABS: 500.0000 mg | ORAL_TABLET | Freq: Four times a day (QID) | ORAL | Status: AC | PRN

## 2011-05-21 MED ORDER — ASPIRIN EC 325 MG PO TBEC: 325.0000 mg | DELAYED_RELEASE_TABLET | Freq: Two times a day (BID) | ORAL | Status: AC

## 2011-05-21 MED ORDER — DSS 100 MG PO CAPS: 100.0000 mg | ORAL_CAPSULE | Freq: Two times a day (BID) | ORAL | Status: AC

## 2011-05-21 MED ORDER — TRAMADOL HCL 50 MG PO TABS: 50.0000 mg | ORAL_TABLET | Freq: Four times a day (QID) | ORAL | Status: AC | PRN

## 2011-05-21 MED ORDER — TRAMADOL HCL 50 MG PO TABS
50.0000 mg | ORAL_TABLET | Freq: Four times a day (QID) | ORAL | Status: DC | PRN
  Administered 2011-05-21 – 2011-05-22 (×3): 50 mg via ORAL
  Filled 2011-05-21 (×3): qty 1

## 2011-05-21 NOTE — Progress Notes (Signed)
  CARE MANAGEMENT NOTE 05/21/2011  Patient:  Antonio Manning, Antonio Manning   Account Number:  [id number]  Date Initiated:  05/21/2011  Documentation initiated by:  Colleen Can  Subjective/Objective Assessment:   DX osteoarthritis hip-left; Total hip replacemnt-anterior approach     Action/Plan:   CM spoke with patient and spouse regarding discharge plans. Patient plans to go to his home in [place] [facility] where his spouse will be cargeiver.   Anticipated DC Date:  05/23/2011   Anticipated DC Plan:  HOME W HOME HEALTH SERVICES  In-house referral  NA      DC Planning Services  CM consult      [facility]  HOME HEALTH   Choice offered to / List presented to:  C-1 Patient   DME arranged  NA      DME agency  NA     HH arranged  HH-2 PT      [facility] agency  [facility]   Status of service:  In process, will continue to follow Medicare Important Message given?  NA - LOS <3 / Initial given by admissions (If response is "NO", the following Medicare IM given date fields will be blank) Date Medicare IM given:   Date Additional Medicare IM given:    Discharge Disposition:    Per UR Regulation:    Comments:  05/21/2011 Raynelle Bring BSN CCM [phone] Pt will need RW and 3N1 equipment. Choice offered for [facility] agency; spouse chose [place] for hh services. Copy of HH agencies placed in shadow chart. [facility] Norlander will provide HHpt and will arrrange for patient's needed DME. -

## 2011-05-21 NOTE — Clinical Documentation Improvement (Signed)
Anemia Blood Loss Clarification  THIS DOCUMENT IS NOT A PERMANENT PART OF THE MEDICAL RECORD  RESPOND TO THE THIS QUERY, FOLLOW THE INSTRUCTIONS BELOW:  1. If needed, update documentation for the patient's encounter via the notes activity.  2. Access this query again and click edit on the In [name].  3. After updating, or not, click F2 to complete all highlighted (required) fields concerning your review. Select "additional documentation in the medical record" OR "no additional documentation provided".  4. Click Sign note button.  5. The deficiency will fall out of your In Basket *Please let us know if you are not able to complete this workflow by phone or e-mail (listed below).        05/21/11  Dear Dr. Charlann Boxer or Humberto Leep PA Marton Redwood  In an effort to better capture your patient's severity of illness, reflect appropriate length of stay and utilization of resources, a review of the patient medical record has revealed the following indicators.    Based on your clinical judgment, please clarify and document in a progress note and/or discharge summary the clinical condition associated with the following supporting information:  In responding to this query please exercise your independent judgment.  The fact that a query is asked, does not imply that any particular answer is desired or expected.  According to lab pt's post op H/H=9.7/28.5. Please clarify based on abnormal H/H whether or not post op H/H can be further specified as one of the diagnoses listed below and document in pn or d/c summary.   Possible Clinical Conditions?   " Expected Acute Blood Loss Anemia  " Acute Blood Loss Anemia  " Acute on chronic blood loss anemia   " Other Condition________________  " Cannot Clinically Determine  Risk Factors: (recent surgery, pre op anemia, EBL in OR)  Supporting Information: L THR,   Signs and Symptoms  Abn H/H  Diagnostics: Op note : 05/20/11 BLOOD LOSS:  500  cc   Component     Latest Ref Rng 05/21/2011  HGB     13.0 - 17.0 g/dL 9.7 (L)  HCT     96.0 - 52.0 % 28.5 (L)  Treatments: IV fluids lactated ringers infusion  Serial H&H monitoring Medications  ferrous sulfate tablet    Reviewed:  no additional documentation provided 1/21/2013ljh  Thank You,  Enis Slipper  RN, BSN, CCDS Clinical Documentation Specialist [facility] Dept Pager: [phone] / E-mail: [email or web address]  Health Information Management [place]

## 2011-05-21 NOTE — Progress Notes (Signed)
Physical Therapy Treatment Patient Details Name: SUTTER AHLGREN MRN: [id number] DOB: 25-Jul-1940 Today's Date: 05/21/2011 1340 - 1355; GT PT Assessment/Plan  PT - Assessment/Plan PT Plan: Discharge plan remains appropriate PT Frequency: 7X/week Recommendations for Other Services: OT consult Follow Up Recommendations: Home health PT Equipment Recommended: Rolling walker with 5" wheels PT Goals  Acute Rehab PT Goals PT Goal Formulation: With patient Time For Goal Achievement: 7 days Pt will go Supine/Side to Sit: with supervision PT Goal: Supine/Side to Sit - Progress: Progressing toward goal Pt will go Sit to Supine/Side: with supervision PT Goal: Sit to Supine/Side - Progress: Progressing toward goal Pt will go Sit to Stand: with supervision PT Goal: Sit to Stand - Progress: Progressing toward goal Pt will go Stand to Sit: with supervision PT Goal: Stand to Sit - Progress: Progressing toward goal Pt will Ambulate: 51 - 150 feet;with supervision;with rolling walker PT Goal: Ambulate - Progress: Goal set today  PT Treatment Precautions/Restrictions  Precautions Precautions: Anterior Hip Restrictions Weight Bearing Restrictions: No Other Position/Activity Restrictions: WBAT Mobility (including Balance) Bed Mobility Sit to Supine: 5: Supervision;4: Min assist Sit to Supine - Details (indicate cue type and reason): cues for technique Transfers Sit to Stand: 4: Min assist;With armrests;From chair/3-in-1;With upper extremity assist Sit to Stand Details (indicate cue type and reason): cues for UE use and LE position Stand to Sit: 4: Min assist;With upper extremity assist;To bed Stand to Sit Details: cues for UE use and LE position Ambulation/Gait Ambulation/Gait Assistance: 4: Min assist;5: Supervision Ambulation/Gait Assistance Details (indicate cue type and reason): cues for position from RW and posture Ambulation Distance (Feet): 200 Feet Assistive device: Rolling walker Gait  Pattern: Step-to pattern    Exercise    End of Session PT - End of Session Activity Tolerance: Patient tolerated treatment well Patient left: in bed;with call bell in reach Nurse Communication: Mobility status for transfers;Mobility status for ambulation General Behavior During Session: [facility] for tasks performed Cognition: [facility] for tasks performed  Phenix Grein 05/21/2011, 2:04 PM

## 2011-05-21 NOTE — Progress Notes (Signed)
Physical Therapy Evaluation Patient Details Name: Antonio Manning MRN: [id number] DOB: 05/23/1940 Today's Date: 05/21/2011 0945 - 1010; EVAL Problem List: There is no problem list on file for this patient.   Past Medical History:  Past Medical History  Diagnosis Date  . Cholesterol serum elevated 05-26-2011    tx. Lipitor  . Cataract May 26, 2011    right  . PONV (postoperative nausea and vomiting) 2011/05/26    with cataract surgery  . Kidney calculi 2011/05/26    past hx. x1-passed on own, mild prostate issues-no meds  . Arthritis 05/26/11    osteoarthritis,lt. hip, and neck area  . Cancer May 26, 2011    skin cancer lesions-multiple  . Hearing loss 05/26/2011    wears hearing aids bilaterally   Past Surgical History:  Past Surgical History  Procedure Date  . Back surgery 2011-05-26    Lumbar disc surgery-no hardware  . Cataract extraction w/ intraocular lens implant May 26, 2011    left eye    PT Assessment/Plan/Recommendation PT Assessment Clinical Impression Statement: Pt with L THR presents with decreased L LE ROM/strength and decreased functional mobility.  PT will benefit from skilled PT intervention to maximize IND for d/c home PT Recommendation/Assessment: Patient will need skilled PT in the acute care venue PT Problem List: Decreased strength;Decreased range of motion;Decreased activity tolerance;Decreased mobility;Decreased knowledge of use of DME;Pain PT Therapy Diagnosis : Difficulty walking PT Plan PT Frequency: 7X/week PT Treatment/Interventions: DME instruction;Gait training;Stair training;Functional mobility training;Therapeutic activities;Therapeutic exercise;Patient/family education PT Recommendation Recommendations for Other Services: OT consult Follow Up Recommendations: Home health PT Equipment Recommended: Rolling walker with 5" wheels PT Goals  Acute Rehab PT Goals PT Goal Formulation: With patient Time For Goal Achievement: 7 days Pt will go Supine/Side to Sit: with  supervision PT Goal: Supine/Side to Sit - Progress: Goal set today Pt will go Sit to Supine/Side: with supervision PT Goal: Sit to Supine/Side - Progress: Goal set today Pt will go Sit to Stand: with supervision PT Goal: Sit to Stand - Progress: Goal set today Pt will go Stand to Sit: with supervision PT Goal: Stand to Sit - Progress: Goal set today Pt will Ambulate: 51 - 150 feet;with supervision;with rolling walker PT Goal: Ambulate - Progress: Goal set today Pt will Go Up / Down Stairs: 1-2 stairs;with min assist;with least restrictive assistive device PT Goal: Up/Down Stairs - Progress: Goal set today  PT Evaluation Precautions/Restrictions  Precautions Precautions: Anterior Hip Restrictions Weight Bearing Restrictions: No Other Position/Activity Restrictions: WBAT Prior Functioning  Home Living Lives With: Spouse Receives Help From: Family Type of Home: House Home Layout: One level Home Access: Stairs to enter Entrance Stairs-Rails: None Entrance Stairs-Number of Steps: 1 (X2) Prior Function Level of Independence: Independent with basic ADLs;Independent with gait;Independent with transfers Able to Take Stairs?: Yes Driving: Yes Cognition Cognition Arousal/Alertness: Awake/alert Overall Cognitive Status: Appears within functional limits for tasks assessed Orientation Level: Oriented X4 Sensation/Coordination Coordination Gross Motor Movements are Fluid and Coordinated: Yes Extremity Assessment RUE Assessment RUE Assessment: Within Functional Limits LUE Assessment LUE Assessment: Within Functional Limits RLE Assessment RLE Assessment: Within Functional Limits LLE Assessment LLE Assessment: Exceptions to [facility] Mobility (including Balance) Bed Mobility Bed Mobility: Yes Supine to Sit: 4: Min assist Supine to Sit Details (indicate cue type and reason): Cues for sequence, min assist for L LE Transfers Transfers: Yes Sit to Stand: 4: Min assist;From bed;With upper  extremity assist Sit to Stand Details (indicate cue type and reason): cues for use of UEs and for LE  position Stand to Sit: 4: Min assist Stand to Sit Details: cues for use of UEs and for LE position Ambulation/Gait Ambulation/Gait: Yes Ambulation/Gait Assistance: 4: Min assist Ambulation/Gait Assistance Details (indicate cue type and reason): cues for posture, sequence, and position from RW Ambulation Distance (Feet): 100 Feet Assistive device: Rolling walker Gait Pattern: Step-to pattern    Exercise  Total Joint Exercises Ankle Circles/Pumps: AROM;10 reps;Supine;Both Heel Slides: AAROM;10 reps;Supine;Left Hip ABduction/ADduction: AAROM;10 reps;Left;Supine End of Session PT - End of Session Equipment Utilized During Treatment: Gait belt Activity Tolerance: Patient tolerated treatment well Patient left: in chair Nurse Communication: Mobility status for transfers;Mobility status for ambulation General Behavior During Session: [facility] for tasks performed Cognition: [facility] for tasks performed  Antonio Manning 05/21/2011, 1:10 PM

## 2011-05-21 NOTE — Progress Notes (Signed)
Patient ID: Antonio Manning, male   DOB: 1941/02/02, 71 y.o.   MRN: [id number] Subjective: 1 Day Post-Op Procedure(s) (LRB): TOTAL HIP ARTHROPLASTY ANTERIOR APPROACH (Left)    Patient reports pain as mild. No events, not much pain, feels hydrocodone may be cause of some nausea Objective:   VITALS:   Filed Vitals:   05/21/11 0424  BP: 126/69  Pulse: 63  Temp: 98.8 F (37.1 C)  Resp: 14    Neurovascular intact Left hip dressing dry, HV drain removed   LABS  Basename 05/21/11 0436  HGB 9.7*  HCT 28.5*  WBC 10.0  PLT 98*     Basename 05/21/11 0436  NA 138  K 4.0  BUN 14  CREATININE 0.83  GLUCOSE 109*    No results found for this basename: LABPT:2,INR:2 in the last 72 hours   Assessment/Plan: 1 Day Post-Op Procedure(s) (LRB): TOTAL HIP ARTHROPLASTY ANTERIOR APPROACH (Left)   Advance diet Up with therapy, WBAT LLE Home tomorrow with HHPT Change to tramadol for pain control, may be ok with just tylenol and robaxin

## 2011-05-22 LAB — BASIC METABOLIC PANEL
BUN: 13 mg/dL (ref 6–23)
Chloride: 102 mEq/L (ref 96–112)
GFR calc Af Amer: >90 mL/min (ref >90)
GFR calc non Af Amer: 84 mL/min — ABNORMAL LOW (ref >90)
Potassium: 3.7 mEq/L (ref 3.5–5.1)
Sodium: 136 mEq/L (ref 135–145)

## 2011-05-22 LAB — CBC
MCHC: 34.2 g/dL (ref 30.0–36.0)
Platelets: 108 10*3/uL — ABNORMAL LOW (ref 150–400)
RDW: 13.2 % (ref 11.5–15.5)
WBC: 8 10*3/uL (ref 4.0–10.5)

## 2011-05-22 NOTE — Progress Notes (Signed)
Physical Therapy Treatment Patient Details Name: Antonio Manning MRN: [id number] DOB: 1941-02-28 Today's Date: 05/22/2011 0845 - 0915; TE, TA PT Assessment/Plan  PT - Assessment/Plan Comments on Treatment Session: Pt ltd by nausea - RN aware PT Plan: Discharge plan remains appropriate PT Frequency: 7X/week Follow Up Recommendations: Home health PT Equipment Recommended: None recommended by OT PT Goals  Acute Rehab PT Goals Time For Goal Achievement: 7 days Pt will go Supine/Side to Sit: with supervision PT Goal: Supine/Side to Sit - Progress: Progressing toward goal Pt will go Sit to Supine/Side: with supervision PT Goal: Sit to Supine/Side - Progress: Progressing toward goal Pt will go Sit to Stand: with supervision PT Goal: Sit to Stand - Progress: Progressing toward goal Pt will go Stand to Sit: with supervision PT Goal: Stand to Sit - Progress: Progressing toward goal  PT Treatment Precautions/Restrictions  Precautions Precautions: Anterior Hip Restrictions Weight Bearing Restrictions: No Other Position/Activity Restrictions: WBAT Mobility (including Balance) Bed Mobility Supine to Sit: 4: Min assist Supine to Sit Details (indicate cue type and reason): cues for sequence and options for assist Transfers Sit to Stand: Other (comment);With upper extremity assist;From chair/3-in-1 (Minguard A w/RW) Sit to Stand Details (indicate cue type and reason): cues for use of UEs Stand to Sit: To chair/3-in-1;With upper extremity assist;With armrests;Other (comment) (Minguard A w/RW) Stand to Sit Details: cues for use of UES and LE position Ambulation/Gait Ambulation/Gait Assistance: 4: Min assist;5: Supervision Ambulation/Gait Assistance Details (indicate cue type and reason): cues for position from RW Ambulation Distance (Feet): 5 Feet Assistive device: Rolling walker Gait Pattern: Step-to pattern    Exercise  Total Joint Exercises Ankle Circles/Pumps: AROM;20  reps;Both;Supine Gluteal Sets: AROM;10 reps;Both;Supine Short Arc Quad: AAROM;AROM;10 reps;5 reps;Supine;Left Heel Slides: AAROM;20 reps;Supine;Left Hip ABduction/ADduction: AAROM;20 reps;Left;Supine End of Session PT - End of Session Activity Tolerance: Other (comment) (pt ltd by nausea with OOB activity - RN alerted) Patient left: with call bell in reach;in chair;with family/visitor present Nurse Communication: Mobility status for transfers;Mobility status for ambulation (pt nausea) General Behavior During Session: [facility] for tasks performed Cognition: [facility] for tasks performed  Antonio Manning 05/22/2011, 11:36 AM

## 2011-05-22 NOTE — Progress Notes (Signed)
Occupational Therapy Evaluation Patient Details Name: Antonio Manning MRN: [id number] DOB: 07/20/1940 Today's Date: 05/22/2011 EV2 [id number] Problem List:  Patient Active Problem List  Diagnoses  . S/P left hip replacement    Past Medical History:  Past Medical History  Diagnosis Date  . Cholesterol serum elevated 05-17-2011    tx. Lipitor  . Cataract 05/17/2011    right  . PONV (postoperative nausea and vomiting) 05/17/11    with cataract surgery  . Kidney calculi 05/17/2011    past hx. x1-passed on own, mild prostate issues-no meds  . Arthritis 2011-05-17    osteoarthritis,lt. hip, and neck area  . Cancer 17-May-2011    skin cancer lesions-multiple  . Hearing loss 2011/05/17    wears hearing aids bilaterally   Past Surgical History:  Past Surgical History  Procedure Date  . Back surgery 05-17-2011    Lumbar disc surgery-no hardware  . Cataract extraction w/ intraocular lens implant May 17, 2011    left eye    OT Assessment/Plan/Recommendation OT Assessment Clinical Impression Statement: All education completed. Pt will have necessary level of A from family upon d/c. OT Recommendation/Assessment: Patient does not need any further OT services OT Recommendation Follow Up Recommendations: No OT follow up Equipment Recommended: None recommended by OT OT Goals    OT Evaluation Precautions/Restrictions  Precautions Precautions: Anterior Hip Restrictions Weight Bearing Restrictions: No Other Position/Activity Restrictions: WBAT Prior Functioning Home Living Lives With: Spouse Receives Help From: Family Type of Home: House Home Layout: One level Home Access: Stairs to enter Entrance Stairs-Rails: None Entrance Stairs-Number of Steps: 1 x2 Bathroom Shower/Tub: Health visitor: Standard Home Adaptive Equipment: Shower chair without back;Bedside commode/3-in-1;Walker - rolling Prior Function Level of Independence: Independent with basic ADLs;Independent with transfers;Needs  assistance with gait Driving: Yes Vocation: Full time employment ADL ADL Grooming: Simulated;Supervision/safety Where Assessed - Grooming: Standing at sink Upper Body Bathing: Simulated;Supervision/safety Where Assessed - Upper Body Bathing: Standing at sink Lower Body Bathing: Simulated;Minimal assistance Where Assessed - Lower Body Bathing: Sit to stand from chair Upper Body Dressing: Simulated;Supervision/safety Where Assessed - Upper Body Dressing: Standing Lower Body Dressing: Simulated;Minimal assistance Where Assessed - Lower Body Dressing: Sit to stand from chair Toilet Transfer: Performed;Supervision/safety Toilet Transfer Method: Proofreader: Raised toilet seat with arms (or 3-in-1 over toilet) Toileting - Clothing Manipulation: Simulated;Supervision/safety Where Assessed - Toileting Clothing Manipulation: Sit to stand from 3-in-1 or toilet Toileting - Hygiene: Simulated;Supervision/safety Where Assessed - Toileting Hygiene: Sit to stand from 3-in-1 or toilet Tub/Shower Transfer: Performed;Other (comment) (Minguard A posterior method.) Equipment Used: Rolling walker;Other (comment) (3:1) ADL Comments: Pt w/ c/o nausea Vision/Perception  Vision - History Patient Visual Report: No change from baseline Vision - Assessment Vision Assessment: Vision not tested Cognition Cognition Arousal/Alertness: Awake/alert Overall Cognitive Status: Appears within functional limits for tasks assessed Orientation Level: Oriented X4 Sensation/Coordination   Extremity Assessment RUE Assessment RUE Assessment: Within Functional Limits LUE Assessment LUE Assessment: Within Functional Limits Mobility  Transfers Transfers: Yes Sit to Stand: Other (comment);With upper extremity assist;From chair/3-in-1 (Minguard A w/RW) Stand to Sit: To chair/3-in-1;With upper extremity assist;With armrests;Other (comment) (Minguard A w/RW) Exercises   End of Session OT - End  of Session Equipment Utilized During Treatment: Other (comment) (RW, 3:1) Activity Tolerance: Patient tolerated treatment well Patient left: in chair;with call bell in reach;with family/visitor present General Behavior During Session: [facility] for tasks performed Cognition: [facility] for tasks performed   Barret Esquivel A, OTR/L [phone] 05/22/2011, 10:53 AM

## 2011-05-22 NOTE — Progress Notes (Signed)
Physical Therapy Treatment Patient Details Name: Antonio Manning MRN: [id number] DOB: 03-27-1941 Today's Date: 05/22/2011 1100 - 1125; GT< TA PT Assessment/Plan  PT - Assessment/Plan Comments on Treatment Session: Reviewed car transfers with pt and wife PT Plan: Discharge plan remains appropriate PT Frequency: 7X/week Follow Up Recommendations: Home health PT Equipment Recommended: None recommended by OT PT Goals  Acute Rehab PT Goals PT Goal Formulation: With patient Time For Goal Achievement: 7 days Pt will go Supine/Side to Sit: with supervision PT Goal: Supine/Side to Sit - Progress: Progressing toward goal Pt will go Sit to Supine/Side: with supervision PT Goal: Sit to Supine/Side - Progress: Progressing toward goal Pt will go Sit to Stand: with supervision PT Goal: Sit to Stand - Progress: Met Pt will go Stand to Sit: with supervision PT Goal: Stand to Sit - Progress: Met Pt will Ambulate: 51 - 150 feet;with supervision;with rolling walker PT Goal: Ambulate - Progress: Progressing toward goal Pt will Go Up / Down Stairs: 1-2 stairs;with min assist;with least restrictive assistive device PT Goal: Up/Down Stairs - Progress: Progressing toward goal  PT Treatment Precautions/Restrictions  Precautions Precautions: Anterior Hip Restrictions Weight Bearing Restrictions: No Other Position/Activity Restrictions: WBAT Mobility (including Balance) Bed Mobility Supine to Sit: 4: Min assist Supine to Sit Details (indicate cue type and reason): cues for sequence and options for assist Transfers Sit to Stand: 5: Supervision;With upper extremity assist;With armrests;From chair/3-in-1 Sit to Stand Details (indicate cue type and reason): cues for use of UEs Stand to Sit: 5: Supervision;With armrests;With upper extremity assist;To chair/3-in-1 Stand to Sit Details: cues for LE positioni Ambulation/Gait Ambulation/Gait Assistance: 5: Supervision Ambulation/Gait Assistance Details  (indicate cue type and reason): cues for stride length, posture and position from RW. Ambulation Distance (Feet): 100 Feet Assistive device: Rolling walker Gait Pattern: Step-to pattern Stairs: Yes Stairs Assistance: 4: Min assist Stairs Assistance Details (indicate cue type and reason): cues for sequence and foot/RW placement Stair Management Technique: With walker;Backwards;Step to pattern Number of Stairs: 1  (x2)     End of Session PT - End of Session Equipment Utilized During Treatment: Gait belt Activity Tolerance: Patient tolerated treatment well (Initially tolerated well but increased nausea with activity) Patient left: with call bell in reach;in chair;with family/visitor present Nurse Communication: Mobility status for transfers;Mobility status for ambulation General Behavior During Session: [facility] for tasks performed Cognition: [facility] for tasks performed  Antonio Manning 05/22/2011, 12:04 PM

## 2011-05-22 NOTE — Progress Notes (Signed)
Cm spoke with pt concerning d/c planning. Pt has DME in room. Per pt choice [facility] to provide HHPT. Wife at bedside. No other needs stated. Pt agrees with plan.  Erskine Speed [phone]

## 2011-05-22 NOTE — Progress Notes (Signed)
Pt stable; d/c instructions, equipment, and scripts given.  No questions/concerns voiced by patient/wife.  Pt transported out via wheelchair by NT and wife to private vehicle.

## 2011-05-22 NOTE — Progress Notes (Signed)
Patient ID: Antonio Manning, male   DOB: 07-10-1940, 71 y.o.   MRN: [id number] Subjective: 2 Days Post-Op Procedure(s) (LRB): TOTAL HIP ARTHROPLASTY ANTERIOR APPROACH (Left)    Patient reports pain as mild. Notes some discomfort at drain site he thinks  Objective:   VITALS:   Filed Vitals:   05/22/11 0619  BP: 121/74  Pulse: 58  Temp: 99.5 F (37.5 C)  Resp: 14    Neurovascular intact Incision: dressing C/D/I Compartment soft  No obvious findings at the drain site, no drainage, no erythema, non tender, no fluctuance  LABS  Basename 05/22/11 0446 05/21/11 0436  HGB 9.5* 9.7*  HCT 27.8* 28.5*  WBC 8.0 10.0  PLT 108* 98*     Basename 05/22/11 0446 05/21/11 0436  NA 136 138  K 3.7 4.0  BUN 13 14  CREATININE 0.87 0.83  GLUCOSE 96 109*    No results found for this basename: LABPT:2,INR:2 in the last 72 hours   Assessment/Plan: 2 Days Post-Op Procedure(s) (LRB): TOTAL HIP ARTHROPLASTY ANTERIOR APPROACH (Left)   Up with therapy D/C IV D/C to home today with HHPT May shower Reviewed dressing care, give gauze and tape at D/C

## 2011-05-23 DIAGNOSIS — Z96649 Presence of unspecified artificial hip joint: Secondary | ICD-10-CM | POA: Diagnosis not present

## 2011-05-23 DIAGNOSIS — M5137 Other intervertebral disc degeneration, lumbosacral region: Secondary | ICD-10-CM | POA: Diagnosis not present

## 2011-05-23 DIAGNOSIS — IMO0001 Reserved for inherently not codable concepts without codable children: Secondary | ICD-10-CM | POA: Diagnosis not present

## 2011-05-23 DIAGNOSIS — Z471 Aftercare following joint replacement surgery: Secondary | ICD-10-CM | POA: Diagnosis not present

## 2011-05-23 DIAGNOSIS — M6281 Muscle weakness (generalized): Secondary | ICD-10-CM | POA: Diagnosis not present

## 2011-05-23 DIAGNOSIS — Z7901 Long term (current) use of anticoagulants: Secondary | ICD-10-CM | POA: Diagnosis not present

## 2011-05-23 NOTE — Discharge Summary (Signed)
Physician Discharge Summary  Patient ID: Antonio Manning MRN: [id number] DOB/AGE: August 15, 1940 71 y.o.  Admit date: 05/20/2011 Discharge date: 05/22/2011  Procedures:  Procedure(s) (LRB): TOTAL HIP ARTHROPLASTY ANTERIOR APPROACH (Left)  Attending Physician: Dr. Charlann Boxer  Admission Diagnoses: Left hip OA and pain    Discharge Diagnoses:  Active Problems:  S/P left hip replacement Cataract Arthritis Hypercholesteremia Hearing loss Cancer - multiple skin lesions  HPI: Pt is a 71 y.o. male complaining of left hip pain for a couple of years years. Pain had continually increased since the beginning. X-rays in the clinic show end-stage arthritic changes of the left hip. Pt has tried various conservative treatments which have failed to alleviate their symptoms including PT and NSAIDs. Various options are discussed with the patient. Risks, benefits and expectations were discussed with the patient. Patient understand the risks, benefits and expectations and wishes to proceed with surgery.   PCP: No primary provider on file.   Discharged Condition: good  Hospital Course:  Patient underwent the above stated procedure on 05/20/2011. Patient tolerated the procedure well and brought to the recovery room in good condition and subsequently to the floor.  POD #1 BP: 126/69 ; Pulse: 63 ; Temp: 98.8 F (37.1 C) ; Resp: 14  Pt's foley was removed, as well as the hemovac drain removed. IV was changed to a saline lock. Patient reports pain as mild. No events, not much pain, feels hydrocodone may be cause of some nausea. Neurovascular intact, dorsiflexion/plantar flexion intact, incision: dressing C/D/I, no cellulitis present and compartment soft.   LABS  Basename  05/21/11 0436   HGB  9.7*   HCT  28.5*    POD #2  BP: 121/74 ; Pulse: 58 ; Temp: 99.5 F (37.5 C) ; Resp: 14  Patient reports pain as mild. Notes some discomfort at drain site he thinks No obvious findings at the drain site, no drainage, no  erythema, non tender, no fluctuance   LABS  Basename  05/22/11 0446    HGB  9.5*    HCT  27.8*      Discharge Exam: Extremities: Homans sign is negative, no sign of DVT, no edema, redness or tenderness in the calves or thighs and no ulcers, gangrene or trophic changes  Disposition: Home-Health Care Svc. Follow up in 2 weeks at [facility]  Discharge Orders    Future Orders Please Complete By Expires   Call MD / Call 911      Comments:   If you experience chest pain or shortness of breath, CALL 911 and be transported to the hospital emergency room.  If you develope a fever above 101 F, pus (white drainage) or increased drainage or redness at the wound, or calf pain, call your surgeon's office.   Discharge instructions      Comments:   Maintain surgical dressing for 8 days, then replace with gauze and tape. Keep the area dry and clean until follow up. Follow up in 2 weeks at [facility]. Call with any questions or concerns.     Constipation Prevention      Comments:   Drink plenty of fluids.  Prune juice may be helpful.  You may use a stool softener, such as Colace (over the counter) 100 mg twice a day.  Use MiraLax (over the counter) for constipation as needed.   Increase activity slowly as tolerated      Weight Bearing as taught in Physical Therapy      Comments:   Use a  walker or crutches as instructed.   Driving restrictions      Comments:   No driving for 4 weeks   Change dressing      Comments:   Maintain surgical dressing for 8 days, then replace with 4x4 guaze and tape. Keep the area dry and clean.   TED hose      Comments:   Use stockings (TED hose) for 2 weeks on both leg(s).  You may remove them at night for sleeping.      Discharge Medication List as of 05/22/2011 12:00 PM    START taking these medications   Details  acetaminophen (TYLENOL) 325 MG tablet Take 2 tablets (650 mg total) by mouth every 6 (six) hours as needed (or Fever >/=  101)., Starting 05/21/2011, Until Sat 05/20/12, No Print    docusate sodium 100 MG CAPS Take 100 mg by mouth 2 (two) times daily., Starting 05/21/2011, Until Mon 05/31/11, No Print    ferrous sulfate 325 (65 FE) MG tablet Take 1 tablet (325 mg total) by mouth 3 (three) times daily after meals., Starting 05/21/2011, Until Sat 05/20/12, No Print    methocarbamol (ROBAXIN) 500 MG tablet Take 1 tablet (500 mg total) by mouth every 6 (six) hours as needed., Starting 05/21/2011, Until Mon 05/31/11, No Print    polyethylene glycol (MIRALAX / GLYCOLAX) packet Take 17 g by mouth daily., Starting 05/21/2011, Until Mon 05/24/11, No Print    traMADol (ULTRAM) 50 MG tablet Take 1-2 tablets (50-100 mg total) by mouth every 6 (six) hours as needed., Starting 05/21/2011, Until Mon 05/31/11, Print      CONTINUE these medications which have CHANGED   Details  aspirin EC 325 MG tablet Take 1 tablet (325 mg total) by mouth 2 (two) times daily. X 4 weeks, Starting 05/21/2011, Until Mon 05/31/11, No Print      CONTINUE these medications which have NOT CHANGED   Details  atorvastatin (LIPITOR) 10 MG tablet Take 10 mg by mouth at bedtime. , Until Discontinued, Historical Med    Calcium Carbonate-Vitamin D (CALCIUM + D PO) Take 1 tablet by mouth 2 (two) times daily.  , Until Discontinued, Historical Med    celecoxib (CELEBREX) 100 MG capsule Take 100 mg by mouth at bedtime.  , Until Discontinued, Historical Med    Glucosamine HCl-Glucosamin SO4 (GLUCOSAMINE COMPLEX PO) Take 1 tablet by mouth 2 (two) times daily.  , Until Discontinued, Historical Med    Multiple Vitamin (MULITIVITAMIN WITH MINERALS) TABS Take 1 tablet by mouth daily.  , Until Discontinued, Historical Med      STOP taking these medications     acetaminophen (TYLENOL) 650 MG CR tablet Comments:  Reason for Stopping:          Follow-up Information    Follow up with [facility]. (Home Physical Therapy)    Contact information:   [phone]          Signed: Anastasio Auerbach. Chaska Hagger   PAC  05/23/2011, 12:49 PM

## 2011-05-24 ENCOUNTER — Encounter (HOSPITAL_COMMUNITY): Payer: Self-pay | Admitting: Orthopedic Surgery

## 2011-05-25 DIAGNOSIS — Z471 Aftercare following joint replacement surgery: Secondary | ICD-10-CM | POA: Diagnosis not present

## 2011-05-25 DIAGNOSIS — M6281 Muscle weakness (generalized): Secondary | ICD-10-CM | POA: Diagnosis not present

## 2011-05-25 DIAGNOSIS — Z96649 Presence of unspecified artificial hip joint: Secondary | ICD-10-CM | POA: Diagnosis not present

## 2011-05-25 DIAGNOSIS — IMO0001 Reserved for inherently not codable concepts without codable children: Secondary | ICD-10-CM | POA: Diagnosis not present

## 2011-05-25 DIAGNOSIS — Z7901 Long term (current) use of anticoagulants: Secondary | ICD-10-CM | POA: Diagnosis not present

## 2011-05-26 DIAGNOSIS — Z96649 Presence of unspecified artificial hip joint: Secondary | ICD-10-CM | POA: Diagnosis not present

## 2011-05-26 DIAGNOSIS — IMO0001 Reserved for inherently not codable concepts without codable children: Secondary | ICD-10-CM | POA: Diagnosis not present

## 2011-05-26 DIAGNOSIS — Z7901 Long term (current) use of anticoagulants: Secondary | ICD-10-CM | POA: Diagnosis not present

## 2011-05-26 DIAGNOSIS — Z471 Aftercare following joint replacement surgery: Secondary | ICD-10-CM | POA: Diagnosis not present

## 2011-05-26 DIAGNOSIS — M6281 Muscle weakness (generalized): Secondary | ICD-10-CM | POA: Diagnosis not present

## 2011-05-28 DIAGNOSIS — IMO0001 Reserved for inherently not codable concepts without codable children: Secondary | ICD-10-CM | POA: Diagnosis not present

## 2011-05-28 DIAGNOSIS — M6281 Muscle weakness (generalized): Secondary | ICD-10-CM | POA: Diagnosis not present

## 2011-05-28 DIAGNOSIS — Z96649 Presence of unspecified artificial hip joint: Secondary | ICD-10-CM | POA: Diagnosis not present

## 2011-05-28 DIAGNOSIS — Z471 Aftercare following joint replacement surgery: Secondary | ICD-10-CM | POA: Diagnosis not present

## 2011-05-28 DIAGNOSIS — Z7901 Long term (current) use of anticoagulants: Secondary | ICD-10-CM | POA: Diagnosis not present

## 2011-05-31 DIAGNOSIS — Z96649 Presence of unspecified artificial hip joint: Secondary | ICD-10-CM | POA: Diagnosis not present

## 2011-05-31 DIAGNOSIS — Z7901 Long term (current) use of anticoagulants: Secondary | ICD-10-CM | POA: Diagnosis not present

## 2011-05-31 DIAGNOSIS — IMO0001 Reserved for inherently not codable concepts without codable children: Secondary | ICD-10-CM | POA: Diagnosis not present

## 2011-05-31 DIAGNOSIS — M6281 Muscle weakness (generalized): Secondary | ICD-10-CM | POA: Diagnosis not present

## 2011-05-31 DIAGNOSIS — Z471 Aftercare following joint replacement surgery: Secondary | ICD-10-CM | POA: Diagnosis not present

## 2011-06-02 DIAGNOSIS — IMO0001 Reserved for inherently not codable concepts without codable children: Secondary | ICD-10-CM | POA: Diagnosis not present

## 2011-06-02 DIAGNOSIS — Z471 Aftercare following joint replacement surgery: Secondary | ICD-10-CM | POA: Diagnosis not present

## 2011-06-02 DIAGNOSIS — Z96649 Presence of unspecified artificial hip joint: Secondary | ICD-10-CM | POA: Diagnosis not present

## 2011-06-02 DIAGNOSIS — Z7901 Long term (current) use of anticoagulants: Secondary | ICD-10-CM | POA: Diagnosis not present

## 2011-06-02 DIAGNOSIS — M6281 Muscle weakness (generalized): Secondary | ICD-10-CM | POA: Diagnosis not present

## 2011-06-03 DIAGNOSIS — M6281 Muscle weakness (generalized): Secondary | ICD-10-CM | POA: Diagnosis not present

## 2011-06-03 DIAGNOSIS — Z7901 Long term (current) use of anticoagulants: Secondary | ICD-10-CM | POA: Diagnosis not present

## 2011-06-03 DIAGNOSIS — IMO0001 Reserved for inherently not codable concepts without codable children: Secondary | ICD-10-CM | POA: Diagnosis not present

## 2011-06-03 DIAGNOSIS — Z471 Aftercare following joint replacement surgery: Secondary | ICD-10-CM | POA: Diagnosis not present

## 2011-06-03 DIAGNOSIS — Z96649 Presence of unspecified artificial hip joint: Secondary | ICD-10-CM | POA: Diagnosis not present

## 2011-06-11 DIAGNOSIS — L578 Other skin changes due to chronic exposure to nonionizing radiation: Secondary | ICD-10-CM | POA: Diagnosis not present

## 2011-06-11 DIAGNOSIS — L570 Actinic keratosis: Secondary | ICD-10-CM | POA: Diagnosis not present

## 2011-06-30 DIAGNOSIS — H2510 Age-related nuclear cataract, unspecified eye: Secondary | ICD-10-CM | POA: Diagnosis not present

## 2011-06-30 DIAGNOSIS — H524 Presbyopia: Secondary | ICD-10-CM | POA: Diagnosis not present

## 2011-06-30 DIAGNOSIS — H35019 Changes in retinal vascular appearance, unspecified eye: Secondary | ICD-10-CM | POA: Diagnosis not present

## 2011-07-07 DIAGNOSIS — M169 Osteoarthritis of hip, unspecified: Secondary | ICD-10-CM | POA: Diagnosis not present

## 2011-08-10 DIAGNOSIS — M5137 Other intervertebral disc degeneration, lumbosacral region: Secondary | ICD-10-CM | POA: Diagnosis not present

## 2011-08-11 DIAGNOSIS — M5137 Other intervertebral disc degeneration, lumbosacral region: Secondary | ICD-10-CM | POA: Diagnosis not present

## 2011-08-13 DIAGNOSIS — M5137 Other intervertebral disc degeneration, lumbosacral region: Secondary | ICD-10-CM | POA: Diagnosis not present

## 2011-08-16 DIAGNOSIS — M5137 Other intervertebral disc degeneration, lumbosacral region: Secondary | ICD-10-CM | POA: Diagnosis not present

## 2011-08-16 DIAGNOSIS — H353190 Nonexudative age-related macular degeneration, unspecified eye, stage unspecified: Secondary | ICD-10-CM | POA: Diagnosis not present

## 2011-08-16 DIAGNOSIS — H5340 Unspecified visual field defects: Secondary | ICD-10-CM | POA: Diagnosis not present

## 2011-08-19 DIAGNOSIS — M5137 Other intervertebral disc degeneration, lumbosacral region: Secondary | ICD-10-CM | POA: Diagnosis not present

## 2011-08-23 DIAGNOSIS — M5137 Other intervertebral disc degeneration, lumbosacral region: Secondary | ICD-10-CM | POA: Diagnosis not present

## 2011-08-26 DIAGNOSIS — M5137 Other intervertebral disc degeneration, lumbosacral region: Secondary | ICD-10-CM | POA: Diagnosis not present

## 2011-09-28 DIAGNOSIS — H2510 Age-related nuclear cataract, unspecified eye: Secondary | ICD-10-CM | POA: Diagnosis not present

## 2011-10-04 DIAGNOSIS — H269 Unspecified cataract: Secondary | ICD-10-CM | POA: Diagnosis not present

## 2011-10-04 DIAGNOSIS — H2510 Age-related nuclear cataract, unspecified eye: Secondary | ICD-10-CM | POA: Diagnosis not present

## 2012-02-08 DIAGNOSIS — R509 Fever, unspecified: Secondary | ICD-10-CM | POA: Diagnosis not present

## 2012-02-08 DIAGNOSIS — J029 Acute pharyngitis, unspecified: Secondary | ICD-10-CM | POA: Diagnosis not present

## 2012-03-10 DIAGNOSIS — R7301 Impaired fasting glucose: Secondary | ICD-10-CM | POA: Diagnosis not present

## 2012-03-10 DIAGNOSIS — E7800 Pure hypercholesterolemia, unspecified: Secondary | ICD-10-CM | POA: Diagnosis not present

## 2012-03-10 DIAGNOSIS — Z79899 Other long term (current) drug therapy: Secondary | ICD-10-CM | POA: Diagnosis not present

## 2012-06-07 DIAGNOSIS — Z96649 Presence of unspecified artificial hip joint: Secondary | ICD-10-CM | POA: Diagnosis not present

## 2012-10-27 DIAGNOSIS — D1801 Hemangioma of skin and subcutaneous tissue: Secondary | ICD-10-CM | POA: Diagnosis not present

## 2012-10-27 DIAGNOSIS — D239 Other benign neoplasm of skin, unspecified: Secondary | ICD-10-CM | POA: Diagnosis not present

## 2012-10-27 DIAGNOSIS — L819 Disorder of pigmentation, unspecified: Secondary | ICD-10-CM | POA: Diagnosis not present

## 2012-10-27 DIAGNOSIS — L820 Inflamed seborrheic keratosis: Secondary | ICD-10-CM | POA: Diagnosis not present

## 2012-10-27 DIAGNOSIS — D692 Other nonthrombocytopenic purpura: Secondary | ICD-10-CM | POA: Diagnosis not present

## 2012-10-27 DIAGNOSIS — L821 Other seborrheic keratosis: Secondary | ICD-10-CM | POA: Diagnosis not present

## 2012-10-27 DIAGNOSIS — L578 Other skin changes due to chronic exposure to nonionizing radiation: Secondary | ICD-10-CM | POA: Diagnosis not present

## 2012-12-05 DIAGNOSIS — M722 Plantar fascial fibromatosis: Secondary | ICD-10-CM | POA: Diagnosis not present

## 2013-01-23 DIAGNOSIS — D692 Other nonthrombocytopenic purpura: Secondary | ICD-10-CM | POA: Diagnosis not present

## 2013-01-23 DIAGNOSIS — Z85828 Personal history of other malignant neoplasm of skin: Secondary | ICD-10-CM | POA: Diagnosis not present

## 2013-01-23 DIAGNOSIS — L570 Actinic keratosis: Secondary | ICD-10-CM | POA: Diagnosis not present

## 2013-01-23 DIAGNOSIS — L821 Other seborrheic keratosis: Secondary | ICD-10-CM | POA: Diagnosis not present

## 2013-08-17 DIAGNOSIS — M79609 Pain in unspecified limb: Secondary | ICD-10-CM | POA: Diagnosis not present

## 2013-08-22 DIAGNOSIS — M25539 Pain in unspecified wrist: Secondary | ICD-10-CM | POA: Diagnosis not present

## 2013-09-13 DIAGNOSIS — R5381 Other malaise: Secondary | ICD-10-CM | POA: Diagnosis not present

## 2013-09-13 DIAGNOSIS — R079 Chest pain, unspecified: Secondary | ICD-10-CM | POA: Diagnosis not present

## 2013-10-02 DIAGNOSIS — H02409 Unspecified ptosis of unspecified eyelid: Secondary | ICD-10-CM | POA: Diagnosis not present

## 2013-10-25 ENCOUNTER — Ambulatory Visit: Payer: Medicare Other | Admitting: Cardiology

## 2013-10-30 ENCOUNTER — Ambulatory Visit (INDEPENDENT_AMBULATORY_CARE_PROVIDER_SITE_OTHER): Payer: Medicare Other | Admitting: Internal Medicine

## 2013-10-30 ENCOUNTER — Encounter: Payer: Self-pay | Admitting: Internal Medicine

## 2013-10-30 ENCOUNTER — Encounter (HOSPITAL_COMMUNITY): Payer: Self-pay | Admitting: *Deleted

## 2013-10-30 VITALS — BP 132/72 | HR 52 | Ht 71.0 in | Wt 166.0 lb

## 2013-10-30 DIAGNOSIS — R001 Bradycardia, unspecified: Secondary | ICD-10-CM | POA: Insufficient documentation

## 2013-10-30 DIAGNOSIS — R5381 Other malaise: Secondary | ICD-10-CM

## 2013-10-30 DIAGNOSIS — R5383 Other fatigue: Secondary | ICD-10-CM | POA: Insufficient documentation

## 2013-10-30 DIAGNOSIS — I498 Other specified cardiac arrhythmias: Secondary | ICD-10-CM | POA: Diagnosis not present

## 2013-10-30 NOTE — Patient Instructions (Signed)
Your physician has requested that you have an exercise tolerance test. For further information please visit [email or web address]. Please also follow instruction sheet, as given.  Your physician recommends that you schedule a follow-up appointment after your stress test with Dr. Debara Pickett.

## 2013-10-30 NOTE — Progress Notes (Signed)
OFFICE NOTE  Chief Complaint:  Fatigue  Primary Care Physician: Gennette Pac, MD  HPI:  Antonio Manning is a pleasant 73 year old male who is kindly referred to me by Dr. Rex Kras. He's been complaining of significant fatigue which is worse over the winter. He reports his symptoms have gotten somewhat better. He denies any chest pain or shortness of breath however he does tire very easily. 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 He was not uncommon for him to do 10 hour days however now he has much difficulty doing only a few hours of work outside in the hot weather. He does have a family history of heart disease although no personal history of heart disease. He reports he probably had a exercise treadmill stress test about 5 years ago which was normal. He is noted to be bradycardic today with an unclear whether he has a history of bradycardia. He does have dyslipidemia as well. He reports his primary care doctor has done numerous other tests evaluating his fatigue and there is apparently some concern for sleep apnea. His wife reports that he has had some trouble with snoring at night and occasionally stops breathing, she thinks, and this may been going on for several years. It is unusual, however that he is now having fatigue symptoms if he's been having significant apnea for years. These are not temporally related.  PMHx:  Past Medical History  Diagnosis Date  . Cholesterol serum elevated 05/20/2011    tx. Lipitor  . Cataract 2011/05/20    right  . PONV (postoperative nausea and vomiting) 2011/05/20    with cataract surgery  . Kidney calculi 05-20-11    past hx. x1-passed on own, mild prostate issues-no meds  . Arthritis 05-20-2011    osteoarthritis,lt. hip, and neck area  . Cancer 05-20-11    skin cancer lesions-multiple  . Hearing loss May 20, 2011    wears hearing aids bilaterally    Past Surgical History  Procedure Laterality Date  . Back surgery  05-20-11    Lumbar  disc surgery-no hardware  . Cataract extraction w/ intraocular lens implant  05-20-11    left eye  . Total hip arthroplasty  05/20/2011    Procedure: TOTAL HIP ARTHROPLASTY ANTERIOR APPROACH;  Surgeon: Mauri Pole, MD;  Location: [facility];  Service: Orthopedics;  Laterality: Left;    FAMHx:  Family History  Problem Relation Age of Onset  . Heart Problems Mother   . Cancer Father   . Cancer Brother   . Heart Problems Brother   . Cancer Brother   . Heart Problems Brother   . Heart Problems Sister   . Hyperlipidemia Sister   . Birth defects Child   . Other Child     one kidney    SOCHx:   reports that he quit smoking about 25 years ago. His smoking use included Cigarettes. He smoked 0.00 packs per day for 25 years. He has never used smokeless tobacco. He reports that he does not drink alcohol or use illicit drugs.  ALLERGIES:  Allergies  Allergen Reactions  . Codeine Nausea And Vomiting    ROS: A comprehensive review of systems was negative except for: Constitutional: positive for fatigue Cardiovascular: positive for bradycardia  HOME MEDS: Current Outpatient Prescriptions  Medication Sig Dispense Refill  . aspirin 81 MG tablet Take 81 mg by mouth daily.      Marland Kitchen atorvastatin (LIPITOR) 10 MG tablet Take 10 mg by  mouth at bedtime.       . Calcium Carbonate-Vitamin D (CALCIUM + D PO) Take 1 tablet by mouth 2 (two) times daily.        . Glucosamine HCl-Glucosamin SO4 (GLUCOSAMINE COMPLEX PO) Take 1 tablet by mouth 2 (two) times daily.        . Multiple Vitamin (MULITIVITAMIN WITH MINERALS) TABS Take 1 tablet by mouth daily.        . nabumetone (RELAFEN) 500 MG tablet Take 500 mg by mouth daily as needed.       No current facility-administered medications for this visit.    LABS/IMAGING: No results found for this or any previous visit (from the past 48 hour(s)). No results found.  VITALS: BP 132/72  Pulse 52  Ht 5\' 11"  (1.803 m)  Wt 166 lb (75.297 kg)  BMI 23.16  kg/m2  EXAM: General appearance: alert and no distress Neck: no carotid bruit and no JVD Lungs: clear to auscultation bilaterally Heart: regular rate and rhythm, S1, S2 normal, no murmur, click, rub or gallop Abdomen: soft, non-tender; bowel sounds normal; no masses,  no organomegaly Extremities: extremities normal, atraumatic, no cyanosis or edema Pulses: 2+ and symmetric Skin: Skin color, texture, turgor normal. No rashes or lesions Neurologic: Grossly normal Psych: Normal mood, affect  EKG: Sinus bradycardia 52, nonspecific interventricular conduction delay  ASSESSMENT: 1. Fatigue 2. Bradycardia  PLAN: 1.   Mr. Segal has fatigue which is possibly multifactorial.  Workup so far has been negative. It would be reasonable to consider sleep testing however it does not seem to temporally relate with his symptoms. He says that his fatigue is improving and was worse this winter. He denies any chest pain or shortness of breath with exertion. It is noted that his heart rate is low at rest. I wonder if he could have chronotropic incompetence. I think it would be reasonable for him to undergo exercise treadmill stress testing to see if he has any signs of ischemia and/or if he has a normal response to exercise with regards to his heart rate.  Thank you again for the kind referral. I will be in contact with the results of his treadmill stress test.  Pixie Casino, MD, [facility] Attending Cardiologist [facility]  HILTY,Kenneth C 10/30/2013, 9:19 AM

## 2013-11-01 ENCOUNTER — Telehealth (HOSPITAL_COMMUNITY): Payer: Self-pay

## 2013-11-01 NOTE — Telephone Encounter (Signed)
Pt wife given detailed instructions for ETT on Wednesday. Pt wife did RBV same.

## 2013-11-07 ENCOUNTER — Ambulatory Visit (HOSPITAL_COMMUNITY)
Admission: RE | Admit: 2013-11-07 | Discharge: 2013-11-07 | Disposition: A | Discharge status: Home / Self Care | Payer: Medicare Other | Source: Ambulatory Visit | Attending: Cardiology | Admitting: Cardiology

## 2013-11-07 DIAGNOSIS — R5383 Other fatigue: Secondary | ICD-10-CM | POA: Diagnosis not present

## 2013-11-07 DIAGNOSIS — I498 Other specified cardiac arrhythmias: Secondary | ICD-10-CM | POA: Insufficient documentation

## 2013-11-07 DIAGNOSIS — R001 Bradycardia, unspecified: Secondary | ICD-10-CM

## 2013-11-07 DIAGNOSIS — R5381 Other malaise: Secondary | ICD-10-CM | POA: Insufficient documentation

## 2013-11-07 NOTE — Procedures (Signed)
Exercise Treadmill Test  Test  Exercise Tolerance Test Ordering MD: Pixie Casino    Unique Test No: 1   Treadmill:  1  Indication for ETT: fatigue  Contraindication to ETT: No   Stress Modality: exercise - treadmill  Cardiac Imaging Performed: non   Protocol: standard Bruce - maximal  Max BP:  194/98  Max MPHR (bpm):  145 85% MPR (bpm):  123  MPHR obtained (bpm):  150 % MPHR obtained:  103  Reached 85% MPHR (min:sec):  4:30 Total Exercise Time (min-sec):  8  Workload in METS:  10.1 Borg Scale: 15  Reason ETT Terminated:  fatigue    ST Segment Analysis At Rest: Sinus rhythm, biatrial enlargement, first degree AV block; no ST changes. With Exercise: no evidence of significant ST depression  Other Information Arrhythmia:  Occasional PVC  Angina during ETT:  absent (0) Quality of ETT:  diagnostic  ETT Interpretation:  normal - no evidence of ischemia by ST analysis  Comments: ETT with good exercise tolerance; no chest pain; normal BP response; no ST changes; negative adequate ETT. [name]'

## 2013-11-23 DIAGNOSIS — Z09 Encounter for follow-up examination after completed treatment for conditions other than malignant neoplasm: Secondary | ICD-10-CM | POA: Diagnosis not present

## 2013-11-23 DIAGNOSIS — Z86010 Personal history of colonic polyps: Secondary | ICD-10-CM | POA: Diagnosis not present

## 2013-11-29 ENCOUNTER — Ambulatory Visit: Payer: Medicare Other | Admitting: Internal Medicine

## 2013-12-27 DIAGNOSIS — Z961 Presence of intraocular lens: Secondary | ICD-10-CM | POA: Diagnosis not present

## 2013-12-27 DIAGNOSIS — H02409 Unspecified ptosis of unspecified eyelid: Secondary | ICD-10-CM | POA: Diagnosis not present

## 2013-12-28 DIAGNOSIS — L570 Actinic keratosis: Secondary | ICD-10-CM | POA: Diagnosis not present

## 2013-12-28 DIAGNOSIS — C44319 Basal cell carcinoma of skin of other parts of face: Secondary | ICD-10-CM | POA: Diagnosis not present

## 2013-12-28 DIAGNOSIS — C4441 Basal cell carcinoma of skin of scalp and neck: Secondary | ICD-10-CM | POA: Diagnosis not present

## 2013-12-28 DIAGNOSIS — D485 Neoplasm of uncertain behavior of skin: Secondary | ICD-10-CM | POA: Diagnosis not present

## 2013-12-28 DIAGNOSIS — Z85828 Personal history of other malignant neoplasm of skin: Secondary | ICD-10-CM | POA: Diagnosis not present

## 2013-12-31 DIAGNOSIS — M25559 Pain in unspecified hip: Secondary | ICD-10-CM | POA: Diagnosis not present

## 2013-12-31 DIAGNOSIS — Z471 Aftercare following joint replacement surgery: Secondary | ICD-10-CM | POA: Diagnosis not present

## 2014-01-16 DIAGNOSIS — M76899 Other specified enthesopathies of unspecified lower limb, excluding foot: Secondary | ICD-10-CM | POA: Diagnosis not present

## 2014-01-23 DIAGNOSIS — M76899 Other specified enthesopathies of unspecified lower limb, excluding foot: Secondary | ICD-10-CM | POA: Diagnosis not present

## 2014-01-25 DIAGNOSIS — M76899 Other specified enthesopathies of unspecified lower limb, excluding foot: Secondary | ICD-10-CM | POA: Diagnosis not present

## 2014-01-30 DIAGNOSIS — M76899 Other specified enthesopathies of unspecified lower limb, excluding foot: Secondary | ICD-10-CM | POA: Diagnosis not present

## 2014-02-01 DIAGNOSIS — M7062 Trochanteric bursitis, left hip: Secondary | ICD-10-CM | POA: Diagnosis not present

## 2014-02-21 DIAGNOSIS — H02403 Unspecified ptosis of bilateral eyelids: Secondary | ICD-10-CM | POA: Diagnosis not present

## 2014-02-21 DIAGNOSIS — Z23 Encounter for immunization: Secondary | ICD-10-CM | POA: Diagnosis not present

## 2014-04-03 DIAGNOSIS — M545 Low back pain: Secondary | ICD-10-CM | POA: Diagnosis not present

## 2014-04-03 DIAGNOSIS — R0602 Shortness of breath: Secondary | ICD-10-CM | POA: Diagnosis not present

## 2014-04-03 DIAGNOSIS — R61 Generalized hyperhidrosis: Secondary | ICD-10-CM | POA: Diagnosis not present

## 2014-04-04 ENCOUNTER — Encounter: Payer: Self-pay | Admitting: Cardiovascular Disease

## 2014-04-04 ENCOUNTER — Ambulatory Visit: Payer: Medicare Other | Admitting: Cardiovascular Disease

## 2014-04-04 ENCOUNTER — Ambulatory Visit (INDEPENDENT_AMBULATORY_CARE_PROVIDER_SITE_OTHER): Payer: Medicare Other | Admitting: Cardiovascular Disease

## 2014-04-04 VITALS — BP 122/70 | HR 65 | Resp 16 | Ht 72.0 in | Wt 169.0 lb

## 2014-04-04 DIAGNOSIS — E785 Hyperlipidemia, unspecified: Secondary | ICD-10-CM | POA: Diagnosis not present

## 2014-04-04 DIAGNOSIS — R0602 Shortness of breath: Secondary | ICD-10-CM | POA: Diagnosis not present

## 2014-04-04 DIAGNOSIS — I700 Atherosclerosis of aorta: Secondary | ICD-10-CM

## 2014-04-04 NOTE — Progress Notes (Signed)
Patient ID: Antonio Manning, male   DOB: December 28, 1940, 73 y.o.   MRN: [id number]      Reason for office visit Diaphoresis, back pain  Antonio Manning does not have known cardiac disease in his only coronary risk factor is hyperlipidemia which is satisfactorily treated with a statin. Earlier this year he had a normal treadmill stress test (ordered to evaluate for chronotropic incompetence/bradycardia rather than chest pain).  He just saw his primary care provider after waking up in the middle of the night and experiencing severe diaphoresis associated with a "stinging" pain in his lower back and a very dry mouth. He denies any chest pain but had a hard time catching his breath. The episode lasted for about 10-15 minutes and resolve spontaneously. He felt better after drinking water. He has not had recent limb injuries, immobilization or prolonged car/plane trips. He does not have a history of pulmonary embolism. He has not been coughing and has not had hemoptysis or pleuritic chest pain.  Prior to the acute symptoms he was very active, mowing several lawns. He appears to be very physically fit. He has planned surgery for eyelid ptosis at [facility] next week.  Right now he is completely asymptomatic.  Lab tests and a chest x-ray ordered after he was seen yesterday. His white blood cell count is normal as is his d-dimer. The chest x-ray was just interpreted early this morning and shows "findings worrisome for developing left lower lung pneumonia superimposed on emphysematous change". His primary care provider has not had a chance to contact him about this result yet.  His electrocardiogram in the office today shows sinus rhythm with left axis deviation in a pattern consistent with left anterior fascicular block. This is very similar to the June electrocardiogram A single PVC is noted.   Allergies  Allergen Reactions  . Codeine Nausea And Vomiting    Current Outpatient Prescriptions    Medication Sig Dispense Refill  . aspirin 81 MG tablet Take 81 mg by mouth daily.    Marland Kitchen atorvastatin (LIPITOR) 10 MG tablet Take 10 mg by mouth at bedtime.     . Calcium Carbonate-Vitamin D (CALCIUM + D PO) Take 1 tablet by mouth 2 (two) times daily.      . Glucosamine HCl-Glucosamin SO4 (GLUCOSAMINE COMPLEX PO) Take 1 tablet by mouth 2 (two) times daily.      . Multiple Vitamin (MULITIVITAMIN WITH MINERALS) TABS Take 1 tablet by mouth daily.       No current facility-administered medications for this visit.    Past Medical History  Diagnosis Date  . Cholesterol serum elevated 06-10-2011    tx. Lipitor  . Cataract 06-10-2011    right  . PONV (postoperative nausea and vomiting) 06-10-2011    with cataract surgery  . Kidney calculi 06/10/2011    past hx. x1-passed on own, mild prostate issues-no meds  . Arthritis 2011/06/10    osteoarthritis,lt. hip, and neck area  . Cancer Jun 10, 2011    skin cancer lesions-multiple  . Hearing loss 2011-06-10    wears hearing aids bilaterally    Past Surgical History  Procedure Laterality Date  . Back surgery  06-10-2011    Lumbar disc surgery-no hardware  . Cataract extraction w/ intraocular lens implant  06-10-2011    left eye  . Total hip arthroplasty  05/20/2011    Procedure: TOTAL HIP ARTHROPLASTY ANTERIOR APPROACH;  Surgeon: Mauri Pole, MD;  Location: [facility];  Service: Orthopedics;  Laterality: Left;  Family History  Problem Relation Age of Onset  . Heart Problems Mother   . Cancer Father   . Cancer Brother   . Heart Problems Brother   . Cancer Brother   . Heart Problems Brother   . Heart Problems Sister   . Hyperlipidemia Sister   . Birth defects Child   . Other Child     one kidney    History   Social History  . Marital Status: Married    Spouse Name: N/A    Number of Children: 3  . Years of Education: 12   Occupational History  . lawn service - retired    Social History Main Topics  . Smoking status: Former Smoker -- 25 years     Types: Cigarettes    Quit date: 05/12/1988  . Smokeless tobacco: Never Used  . Alcohol Use: No  . Drug Use: No  . Sexual Activity: Yes   Other Topics Concern  . Not on file   Social History Narrative    Review of systems: Other than described above, he denies any chest pain at rest or with exertion, dyspnea at rest or with exertion, orthopnea, paroxysmal nocturnal dyspnea, syncope, palpitations, focal neurological deficits, intermittent claudication, lower extremity edema, unexplained weight gain, cough, hemoptysis or wheezing.  The patient also denies abdominal pain, nausea, vomiting, dysphagia, diarrhea, constipation, polyuria, polydipsia, dysuria, hematuria, frequency, urgency, abnormal bleeding or bruising, fever, chills, unexpected weight changes, mood swings, change in skin or hair texture, change in voice quality, auditory or visual problems, allergic reactions or rashes, new musculoskeletal complaints other than usual "aches and pains".   PHYSICAL EXAM BP 122/70 mmHg  Pulse 65  Resp 16  Ht 6' (1.829 m)  Wt 169 lb (76.658 kg)  BMI 22.92 kg/m2  General: Alert, oriented x3, no distress. Appears younger than the stated age Head: no evidence of trauma, PERRL, EOMI, no exophtalmos or lid lag, no myxedema, no xanthelasma; normal ears, nose and oropharynx Neck: normal jugular venous pulsations and no hepatojugular reflux; brisk carotid pulses without delay and no carotid bruits Chest: clear to auscultation, no signs of consolidation by percussion or palpation, normal fremitus, symmetrical and full respiratory excursions Cardiovascular: normal position and quality of the apical impulse, regular rhythm, normal first and second heart sounds, no murmurs, rubs or gallops Abdomen: no tenderness or distention, no masses by palpation, no abnormal pulsatility or arterial bruits, normal bowel sounds, no hepatosplenomegaly Extremities: no clubbing, cyanosis or edema; 2+ radial, ulnar and  brachial pulses bilaterally; 2+ right femoral, posterior tibial and dorsalis pedis pulses; 2+ left femoral, posterior tibial and dorsalis pedis pulses; no subclavian or femoral bruits Neurological: grossly nonfocal   EKG: Normal sinus rhythm with a single PVC, left anterior fascicular block, otherwise normal  Lipid Panel  No results found for: CHOL, TRIG, HDL, CHOLHDL, VLDL, LDLCALC, LDLDIRECT  BMET    Component Value Date/Time   NA 136 05/22/2011 0446   K 3.7 05/22/2011 0446   CL 102 05/22/2011 0446   CO2 30 05/22/2011 0446   GLUCOSE 96 05/22/2011 0446   BUN 13 05/22/2011 0446   CREATININE 0.87 05/22/2011 0446   CALCIUM 8.7 05/22/2011 0446   GFRNONAA 84* 05/22/2011 0446   GFRAA >90 05/22/2011 0446     ASSESSMENT AND PLAN  I don't think his symptoms are an expression of an acute coronary event. He does have hyperlipidemia and his chest x-ray documents aortic atherosclerosis, but otherwise he has no signs or symptoms of coronary disease.  His chest x-rays concerning for pneumonia. I think Dr. Eddie Dibbles office is trying to contact him with treatment recommendations. I recommended that he call me back in case he has any difficulty getting in touch with Dr. Eddie Dibbles office.  It may be worthwhile delaying his eyelid surgery until he recovers from, although today he looks great. From a cardiac point of view I think his risk of serious complications with the eyelid surgery is very low. He will follow-up in the cardiology office as needed.  Orders Placed This Encounter  Procedures  . EKG 12-Lead   No orders of the defined types were placed in this encounter.    Holli Humbles, MD, [place] [phone] office [phone] pager

## 2014-04-04 NOTE — Patient Instructions (Signed)
Your physician recommends that you schedule a follow-up appointment in: AS NEEDED  

## 2014-04-09 DIAGNOSIS — M25562 Pain in left knee: Secondary | ICD-10-CM | POA: Diagnosis not present

## 2014-04-15 DIAGNOSIS — J189 Pneumonia, unspecified organism: Secondary | ICD-10-CM | POA: Diagnosis not present

## 2014-05-01 DIAGNOSIS — Z9841 Cataract extraction status, right eye: Secondary | ICD-10-CM | POA: Diagnosis not present

## 2014-05-01 DIAGNOSIS — E785 Hyperlipidemia, unspecified: Secondary | ICD-10-CM | POA: Diagnosis not present

## 2014-05-01 DIAGNOSIS — Z96649 Presence of unspecified artificial hip joint: Secondary | ICD-10-CM | POA: Diagnosis not present

## 2014-05-01 DIAGNOSIS — Z7982 Long term (current) use of aspirin: Secondary | ICD-10-CM | POA: Diagnosis not present

## 2014-05-01 DIAGNOSIS — Z9842 Cataract extraction status, left eye: Secondary | ICD-10-CM | POA: Diagnosis not present

## 2014-05-01 DIAGNOSIS — H02403 Unspecified ptosis of bilateral eyelids: Secondary | ICD-10-CM | POA: Diagnosis not present

## 2014-05-01 DIAGNOSIS — Z885 Allergy status to narcotic agent status: Secondary | ICD-10-CM | POA: Diagnosis not present

## 2014-05-01 DIAGNOSIS — Z9889 Other specified postprocedural states: Secondary | ICD-10-CM | POA: Diagnosis not present

## 2014-05-01 DIAGNOSIS — Z87891 Personal history of nicotine dependence: Secondary | ICD-10-CM | POA: Diagnosis not present

## 2014-05-01 DIAGNOSIS — Z85828 Personal history of other malignant neoplasm of skin: Secondary | ICD-10-CM | POA: Diagnosis not present

## 2014-07-02 DIAGNOSIS — C44311 Basal cell carcinoma of skin of nose: Secondary | ICD-10-CM | POA: Diagnosis not present

## 2014-07-02 DIAGNOSIS — Z85828 Personal history of other malignant neoplasm of skin: Secondary | ICD-10-CM | POA: Diagnosis not present

## 2014-07-02 DIAGNOSIS — C44319 Basal cell carcinoma of skin of other parts of face: Secondary | ICD-10-CM | POA: Diagnosis not present

## 2014-07-02 DIAGNOSIS — L570 Actinic keratosis: Secondary | ICD-10-CM | POA: Diagnosis not present

## 2014-07-02 DIAGNOSIS — L308 Other specified dermatitis: Secondary | ICD-10-CM | POA: Diagnosis not present

## 2014-07-02 DIAGNOSIS — D170 Benign lipomatous neoplasm of skin and subcutaneous tissue of head, face and neck: Secondary | ICD-10-CM | POA: Diagnosis not present

## 2014-07-02 DIAGNOSIS — L438 Other lichen planus: Secondary | ICD-10-CM | POA: Diagnosis not present

## 2014-07-02 DIAGNOSIS — D485 Neoplasm of uncertain behavior of skin: Secondary | ICD-10-CM | POA: Diagnosis not present

## 2014-12-02 DIAGNOSIS — R05 Cough: Secondary | ICD-10-CM | POA: Diagnosis not present

## 2015-01-03 DIAGNOSIS — Z0000 Encounter for general adult medical examination without abnormal findings: Secondary | ICD-10-CM | POA: Diagnosis not present

## 2015-02-12 DIAGNOSIS — I714 Abdominal aortic aneurysm, without rupture: Secondary | ICD-10-CM | POA: Diagnosis not present

## 2015-02-12 DIAGNOSIS — Z23 Encounter for immunization: Secondary | ICD-10-CM | POA: Diagnosis not present

## 2015-02-12 DIAGNOSIS — R001 Bradycardia, unspecified: Secondary | ICD-10-CM | POA: Diagnosis not present

## 2015-02-12 DIAGNOSIS — R9431 Abnormal electrocardiogram [ECG] [EKG]: Secondary | ICD-10-CM | POA: Diagnosis not present

## 2015-03-11 DIAGNOSIS — M7541 Impingement syndrome of right shoulder: Secondary | ICD-10-CM | POA: Diagnosis not present

## 2015-05-07 DIAGNOSIS — R350 Frequency of micturition: Secondary | ICD-10-CM | POA: Diagnosis not present

## 2015-05-07 DIAGNOSIS — K469 Unspecified abdominal hernia without obstruction or gangrene: Secondary | ICD-10-CM | POA: Diagnosis not present

## 2015-07-16 DIAGNOSIS — H16223 Keratoconjunctivitis sicca, not specified as Sjogren's, bilateral: Secondary | ICD-10-CM | POA: Diagnosis not present

## 2015-08-04 DIAGNOSIS — L821 Other seborrheic keratosis: Secondary | ICD-10-CM | POA: Diagnosis not present

## 2015-08-04 DIAGNOSIS — L812 Freckles: Secondary | ICD-10-CM | POA: Diagnosis not present

## 2015-08-04 DIAGNOSIS — L723 Sebaceous cyst: Secondary | ICD-10-CM | POA: Diagnosis not present

## 2015-08-04 DIAGNOSIS — C44519 Basal cell carcinoma of skin of other part of trunk: Secondary | ICD-10-CM | POA: Diagnosis not present

## 2015-08-04 DIAGNOSIS — D1801 Hemangioma of skin and subcutaneous tissue: Secondary | ICD-10-CM | POA: Diagnosis not present

## 2015-08-04 DIAGNOSIS — L570 Actinic keratosis: Secondary | ICD-10-CM | POA: Diagnosis not present

## 2015-08-04 DIAGNOSIS — Z85828 Personal history of other malignant neoplasm of skin: Secondary | ICD-10-CM | POA: Diagnosis not present

## 2015-08-04 DIAGNOSIS — D0472 Carcinoma in situ of skin of left lower limb, including hip: Secondary | ICD-10-CM | POA: Diagnosis not present

## 2015-08-04 DIAGNOSIS — D170 Benign lipomatous neoplasm of skin and subcutaneous tissue of head, face and neck: Secondary | ICD-10-CM | POA: Diagnosis not present

## 2015-08-04 DIAGNOSIS — D485 Neoplasm of uncertain behavior of skin: Secondary | ICD-10-CM | POA: Diagnosis not present

## 2015-08-04 DIAGNOSIS — C44319 Basal cell carcinoma of skin of other parts of face: Secondary | ICD-10-CM | POA: Diagnosis not present

## 2015-08-13 DIAGNOSIS — H1013 Acute atopic conjunctivitis, bilateral: Secondary | ICD-10-CM | POA: Diagnosis not present

## 2015-09-04 DIAGNOSIS — S46111A Strain of muscle, fascia and tendon of long head of biceps, right arm, initial encounter: Secondary | ICD-10-CM | POA: Diagnosis not present

## 2015-09-22 DIAGNOSIS — S46112D Strain of muscle, fascia and tendon of long head of biceps, left arm, subsequent encounter: Secondary | ICD-10-CM | POA: Diagnosis not present

## 2015-10-02 DIAGNOSIS — R4701 Aphasia: Secondary | ICD-10-CM

## 2015-10-02 DIAGNOSIS — I639 Cerebral infarction, unspecified: Secondary | ICD-10-CM

## 2015-10-02 HISTORY — DX: Aphasia: R47.01

## 2015-10-02 HISTORY — DX: Cerebral infarction, unspecified: I63.9

## 2015-10-27 ENCOUNTER — Observation Stay (HOSPITAL_BASED_OUTPATIENT_CLINIC_OR_DEPARTMENT_OTHER): Payer: Medicare Other

## 2015-10-27 ENCOUNTER — Emergency Department (HOSPITAL_COMMUNITY): Payer: Medicare Other

## 2015-10-27 ENCOUNTER — Inpatient Hospital Stay (HOSPITAL_COMMUNITY)
Admission: EM | Admit: 2015-10-27 | Discharge: 2015-10-29 | DRG: 041 | Disposition: A | Discharge status: Home Health | Payer: Medicare Other | Attending: Internal Medicine | Admitting: Internal Medicine

## 2015-10-27 ENCOUNTER — Other Ambulatory Visit: Payer: Self-pay

## 2015-10-27 ENCOUNTER — Observation Stay (HOSPITAL_COMMUNITY): Payer: Medicare Other

## 2015-10-27 ENCOUNTER — Encounter (HOSPITAL_COMMUNITY): Payer: Self-pay | Admitting: Radiology

## 2015-10-27 DIAGNOSIS — Q2112 Patent foramen ovale: Secondary | ICD-10-CM

## 2015-10-27 DIAGNOSIS — Z85828 Personal history of other malignant neoplasm of skin: Secondary | ICD-10-CM

## 2015-10-27 DIAGNOSIS — Q211 Atrial septal defect: Secondary | ICD-10-CM | POA: Diagnosis not present

## 2015-10-27 DIAGNOSIS — Z96642 Presence of left artificial hip joint: Secondary | ICD-10-CM | POA: Diagnosis present

## 2015-10-27 DIAGNOSIS — G459 Transient cerebral ischemic attack, unspecified: Secondary | ICD-10-CM | POA: Diagnosis not present

## 2015-10-27 DIAGNOSIS — I5032 Chronic diastolic (congestive) heart failure: Secondary | ICD-10-CM | POA: Diagnosis present

## 2015-10-27 DIAGNOSIS — H9190 Unspecified hearing loss, unspecified ear: Secondary | ICD-10-CM | POA: Diagnosis present

## 2015-10-27 DIAGNOSIS — Z87891 Personal history of nicotine dependence: Secondary | ICD-10-CM

## 2015-10-27 DIAGNOSIS — Z7982 Long term (current) use of aspirin: Secondary | ICD-10-CM

## 2015-10-27 DIAGNOSIS — Z885 Allergy status to narcotic agent status: Secondary | ICD-10-CM

## 2015-10-27 DIAGNOSIS — I6330 Cerebral infarction due to thrombosis of unspecified cerebral artery: Secondary | ICD-10-CM | POA: Diagnosis not present

## 2015-10-27 DIAGNOSIS — I639 Cerebral infarction, unspecified: Secondary | ICD-10-CM | POA: Diagnosis not present

## 2015-10-27 DIAGNOSIS — Z809 Family history of malignant neoplasm, unspecified: Secondary | ICD-10-CM | POA: Diagnosis not present

## 2015-10-27 DIAGNOSIS — I340 Nonrheumatic mitral (valve) insufficiency: Secondary | ICD-10-CM | POA: Diagnosis not present

## 2015-10-27 DIAGNOSIS — R4701 Aphasia: Secondary | ICD-10-CM | POA: Diagnosis present

## 2015-10-27 DIAGNOSIS — I6340 Cerebral infarction due to embolism of unspecified cerebral artery: Secondary | ICD-10-CM | POA: Diagnosis not present

## 2015-10-27 DIAGNOSIS — I712 Thoracic aortic aneurysm, without rupture: Secondary | ICD-10-CM | POA: Diagnosis present

## 2015-10-27 DIAGNOSIS — R29704 NIHSS score 4: Secondary | ICD-10-CM | POA: Diagnosis present

## 2015-10-27 DIAGNOSIS — E785 Hyperlipidemia, unspecified: Secondary | ICD-10-CM | POA: Diagnosis not present

## 2015-10-27 DIAGNOSIS — Z79899 Other long term (current) drug therapy: Secondary | ICD-10-CM | POA: Diagnosis not present

## 2015-10-27 DIAGNOSIS — R29810 Facial weakness: Secondary | ICD-10-CM | POA: Diagnosis not present

## 2015-10-27 DIAGNOSIS — Z8673 Personal history of transient ischemic attack (TIA), and cerebral infarction without residual deficits: Secondary | ICD-10-CM | POA: Diagnosis present

## 2015-10-27 DIAGNOSIS — R2990 Unspecified symptoms and signs involving the nervous system: Secondary | ICD-10-CM

## 2015-10-27 DIAGNOSIS — R4781 Slurred speech: Secondary | ICD-10-CM | POA: Diagnosis not present

## 2015-10-27 DIAGNOSIS — I6789 Other cerebrovascular disease: Secondary | ICD-10-CM | POA: Diagnosis not present

## 2015-10-27 HISTORY — DX: Aphasia: R47.01

## 2015-10-27 HISTORY — DX: Cerebral infarction, unspecified: I63.9

## 2015-10-27 LAB — CBC
HCT: 39.9 % (ref 39.0–52.0)
HCT: 38.4 % — ABNORMAL LOW (ref 39.0–52.0)
HEMOGLOBIN: 13 g/dL (ref 13.0–17.0)
Hemoglobin: 12.8 g/dL — ABNORMAL LOW (ref 13.0–17.0)
MCH: 29.3 pg (ref 26.0–34.0)
MCH: 30.3 pg (ref 26.0–34.0)
MCHC: 32.6 g/dL (ref 30.0–36.0)
MCHC: 33.3 g/dL (ref 30.0–36.0)
MCV: 90.1 fL (ref 78.0–100.0)
MCV: 91 fL (ref 78.0–100.0)
PLATELETS: 205 10*3/uL (ref 150–400)
Platelets: 229 10*3/uL (ref 150–400)
RBC: 4.43 MIL/uL (ref 4.22–5.81)
RBC: 4.22 MIL/uL (ref 4.22–5.81)
RDW: 13.7 % (ref 11.5–15.5)
RDW: 13.7 % (ref 11.5–15.5)
WBC: 5.9 10*3/uL (ref 4.0–10.5)
WBC: 4.6 10*3/uL (ref 4.0–10.5)

## 2015-10-27 LAB — LIPID PANEL
CHOL/HDL RATIO: 3 ratio
CHOLESTEROL: 151 mg/dL (ref 0–200)
HDL: 50 mg/dL (ref >40)
LDL Cholesterol: 88 mg/dL (ref 0–99)
Triglycerides: 66 mg/dL (ref <150)
VLDL: 13 mg/dL (ref 0–40)

## 2015-10-27 LAB — CBG MONITORING, ED: GLUCOSE-CAPILLARY: 96 mg/dL (ref 65–99)

## 2015-10-27 LAB — GLUCOSE, CAPILLARY: Glucose-Capillary: 109 mg/dL — ABNORMAL HIGH (ref 65–99)

## 2015-10-27 LAB — ECHOCARDIOGRAM COMPLETE
EERAT: 7.33
EWDT: 289 ms
FS: 32 % (ref 28–44)
Height: 72 in
IVS/LV PW RATIO, ED: 0.92
LA ID, A-P, ES: 43 mm
LA diam end sys: 43 mm
LA diam index: 2.22 cm/m2
LA vol A4C: 49.4 ml
LV E/e'average: 7.33
LV TDI E'LATERAL: 7.29
LV e' LATERAL: 7.29 cm/s
LVEEMED: 7.33
LVOT area: 4.91 cm2
LVOTD: 25 mm
MV Dec: 289
MV pk A vel: 96 m/s
MVPKEVEL: 53.4 m/s
PW: 13 mm — AB (ref 0.6–1.1)
RV TAPSE: 22.2 mm
Reg peak vel: 225 cm/s
TDI e' medial: 3.48
TR max vel: 225 cm/s
Weight: 2560 oz

## 2015-10-27 LAB — COMPREHENSIVE METABOLIC PANEL
ALBUMIN: 4 g/dL (ref 3.5–5.0)
ALT: 16 U/L — ABNORMAL LOW (ref 17–63)
ANION GAP: 6 (ref 5–15)
AST: 21 U/L (ref 15–41)
Alkaline Phosphatase: 56 U/L (ref 38–126)
BILIRUBIN TOTAL: 1.2 mg/dL (ref 0.3–1.2)
BUN: 25 mg/dL — ABNORMAL HIGH (ref 6–20)
CHLORIDE: 109 mmol/L (ref 101–111)
CO2: 27 mmol/L (ref 22–32)
Calcium: 9.4 mg/dL (ref 8.9–10.3)
Creatinine, Ser: 1.07 mg/dL (ref 0.61–1.24)
GFR calc Af Amer: >60 mL/min (ref >60)
Glucose, Bld: 104 mg/dL — ABNORMAL HIGH (ref 65–99)
POTASSIUM: 4 mmol/L (ref 3.5–5.1)
Sodium: 142 mmol/L (ref 135–145)
TOTAL PROTEIN: 6.4 g/dL — AB (ref 6.5–8.1)

## 2015-10-27 LAB — DIFFERENTIAL
BASOS ABS: 0 10*3/uL (ref 0.0–0.1)
BASOS PCT: 0 %
EOS PCT: 4 %
Eosinophils Absolute: 0.2 10*3/uL (ref 0.0–0.7)
LYMPHS ABS: 2.4 10*3/uL (ref 0.7–4.0)
Lymphocytes Relative: 40 %
MONOS PCT: 11 %
Monocytes Absolute: 0.7 10*3/uL (ref 0.1–1.0)
NEUTROS PCT: 45 %
Neutro Abs: 2.6 10*3/uL (ref 1.7–7.7)

## 2015-10-27 LAB — I-STAT CHEM 8, ED
BUN: 29 mg/dL — ABNORMAL HIGH (ref 6–20)
CALCIUM ION: 1.14 mmol/L (ref 1.13–1.30)
Chloride: 106 mmol/L (ref 101–111)
Creatinine, Ser: 1 mg/dL (ref 0.61–1.24)
Glucose, Bld: 98 mg/dL (ref 65–99)
HEMATOCRIT: 40 % (ref 39.0–52.0)
HEMOGLOBIN: 13.6 g/dL (ref 13.0–17.0)
Potassium: 3.9 mmol/L (ref 3.5–5.1)
SODIUM: 143 mmol/L (ref 135–145)
TCO2: 28 mmol/L (ref 0–100)

## 2015-10-27 LAB — I-STAT TROPONIN, ED: TROPONIN I, POC: 0 ng/mL (ref 0.00–0.08)

## 2015-10-27 LAB — CREATININE, SERUM: CREATININE: 0.89 mg/dL (ref 0.61–1.24)

## 2015-10-27 LAB — TROPONIN I: Troponin I: <0.03 ng/mL (ref <0.031)

## 2015-10-27 LAB — APTT: APTT: 29 s (ref 24–37)

## 2015-10-27 LAB — PROTIME-INR
INR: 1.06 (ref 0.00–1.49)
Prothrombin Time: 14 seconds (ref 11.6–15.2)

## 2015-10-27 MED ORDER — SENNOSIDES-DOCUSATE SODIUM 8.6-50 MG PO TABS: 1.0000 | ORAL_TABLET | Freq: Every evening | ORAL | Status: DC | PRN

## 2015-10-27 MED ORDER — ENOXAPARIN SODIUM 40 MG/0.4ML ~~LOC~~ SOLN
40.0000 mg | SUBCUTANEOUS | Status: DC
  Administered 2015-10-27 – 2015-10-29 (×3): 40 mg via SUBCUTANEOUS
  Filled 2015-10-27 (×3): qty 0.4

## 2015-10-27 MED ORDER — LORAZEPAM 2 MG/ML IJ SOLN
INTRAMUSCULAR | Status: AC
  Filled 2015-10-27: qty 1

## 2015-10-27 MED ORDER — ASPIRIN 300 MG RE SUPP: 300.0000 mg | Freq: Every day | RECTAL | Status: DC

## 2015-10-27 MED ORDER — LORAZEPAM 2 MG/ML IJ SOLN
1.0000 mg | Freq: Once | INTRAMUSCULAR | Status: AC
  Administered 2015-10-27: 1 mg via INTRAVENOUS

## 2015-10-27 MED ORDER — LORAZEPAM 2 MG/ML IJ SOLN: 1.0000 mg | Freq: Once | INTRAMUSCULAR | Status: DC

## 2015-10-27 MED ORDER — ACETAMINOPHEN 325 MG PO TABS
650.0000 mg | ORAL_TABLET | ORAL | Status: DC | PRN
  Filled 2015-10-27: qty 2

## 2015-10-27 MED ORDER — STROKE: EARLY STAGES OF RECOVERY BOOK
Freq: Once | Status: DC
  Filled 2015-10-27 (×2): qty 1

## 2015-10-27 MED ORDER — ASPIRIN 325 MG PO TABS
325.0000 mg | ORAL_TABLET | Freq: Every day | ORAL | Status: DC
  Administered 2015-10-27 – 2015-10-29 (×3): 325 mg via ORAL
  Filled 2015-10-27 (×4): qty 1

## 2015-10-27 MED ORDER — ATORVASTATIN CALCIUM 10 MG PO TABS
10.0000 mg | ORAL_TABLET | Freq: Every day | ORAL | Status: DC
Start: 2015-10-27 — End: 2015-10-29
  Administered 2015-10-27 – 2015-10-28 (×2): 10 mg via ORAL
  Filled 2015-10-27 (×2): qty 1

## 2015-10-27 MED ORDER — LORAZEPAM 2 MG/ML IJ SOLN
1.0000 mg | Freq: Once | INTRAMUSCULAR | Status: AC
  Administered 2015-10-27: 1 mg via INTRAVENOUS
  Filled 2015-10-27: qty 1

## 2015-10-27 MED ORDER — ACETAMINOPHEN 650 MG RE SUPP: 650.0000 mg | RECTAL | Status: DC | PRN

## 2015-10-27 NOTE — Care Management Note (Signed)
Case Management Note  Patient Details  Name: Antonio Manning MRN: [id number] Date of Birth: 19-Aug-1940  Subjective/Objective:   Pt admitted with CVA. He is from home with his spouse.                  Action/Plan: Awaiting PT/OT recommendations. CM following for discharge disposition.   Expected Discharge Date:                  Expected Discharge Plan:     In-House Referral:     Discharge planning Services     Post Acute Care Choice:    Choice offered to:     DME Arranged:    DME Agency:     HH Arranged:    HH Agency:     Status of Service:  In process, will continue to follow  If discussed at Long Length of Stay Meetings, dates discussed:    Additional Comments:  Pollie Friar, RN 10/27/2015, 11:36 AM

## 2015-10-27 NOTE — Consult Note (Signed)
Admission H&P    Chief Complaint: Acute onset of speech difficulty.  HPI: Antonio Manning is an 75 y.o. male with a history of hyperlipidemia, arthritis and hearing loss, brought to the emergency room and code stroke status following acute onset of expressive aphasia. Patient was last seen well at 9:30 PM. He woke up at 2 AM and was unable to express himself verbally. He can understand well what was being said to him. Right facial droop was noted by his wife. No weakness of extremities was noted. CT scan of his head showed no acute intracranial abnormality. Patient has been taking aspirin daily. NIH stroke score was 4. Patient was beyond time window for treatment with IV TPA when he arrived in the emergency room.  LSN: 9:30 PM on 10/26/2015 tPA Given: No: Beyond time window for treatment mRankin:  Past Medical History  Diagnosis Date  . Cholesterol serum elevated 2011-06-06    tx. Lipitor  . Cataract 06-06-11    right  . PONV (postoperative nausea and vomiting) 06/06/2011    with cataract surgery  . Kidney calculi 06-Jun-2011    past hx. x1-passed on own, mild prostate issues-no meds  . Arthritis 2011-06-06    osteoarthritis,lt. hip, and neck area  . Cancer ([place]) 2011-06-06    skin cancer lesions-multiple  . Hearing loss Jun 06, 2011    wears hearing aids bilaterally    Past Surgical History  Procedure Laterality Date  . Back surgery  Jun 06, 2011    Lumbar disc surgery-no hardware  . Cataract extraction w/ intraocular lens implant  06/06/2011    left eye  . Total hip arthroplasty  05/20/2011    Procedure: TOTAL HIP ARTHROPLASTY ANTERIOR APPROACH;  Surgeon: Mauri Pole, MD;  Location: [facility];  Service: Orthopedics;  Laterality: Left;    Family History  Problem Relation Age of Onset  . Heart Problems Mother   . Cancer Father   . Cancer Brother   . Heart Problems Brother   . Cancer Brother   . Heart Problems Brother   . Heart Problems Sister   . Hyperlipidemia Sister   . Birth defects Child   . Other  Child     one kidney   Social History:  reports that he quit smoking about 27 years ago. His smoking use included Cigarettes. He quit after 25 years of use. He has never used smokeless tobacco. He reports that he does not drink alcohol or use illicit drugs.  Allergies:  Allergies  Allergen Reactions  . Codeine Nausea And Vomiting    Medications: Preadmission medications were reviewed by me.  ROS: History obtained from spouse  General ROS: negative for - chills, fatigue, fever, night sweats, weight gain or weight loss Psychological ROS: negative for - behavioral disorder, hallucinations, memory difficulties, mood swings or suicidal ideation Ophthalmic ROS: negative for - blurry vision, double vision, eye pain or loss of vision ENT ROS: negative for - epistaxis, nasal discharge, oral lesions, sore throat, tinnitus or vertigo Allergy and Immunology ROS: negative for - hives or itchy/watery eyes Hematological and Lymphatic ROS: negative for - bleeding problems, bruising or swollen lymph nodes Endocrine ROS: negative for - galactorrhea, hair pattern changes, polydipsia/polyuria or temperature intolerance Respiratory ROS: negative for - cough, hemoptysis, shortness of breath or wheezing Cardiovascular ROS: negative for - chest pain, dyspnea on exertion, edema or irregular heartbeat Gastrointestinal ROS: negative for - abdominal pain, diarrhea, hematemesis, nausea/vomiting or stool incontinence Genito-Urinary ROS: negative for - dysuria, hematuria, incontinence or urinary frequency/urgency  Musculoskeletal ROS: negative for - joint swelling or muscular weakness Neurological ROS: as noted in HPI Dermatological ROS: negative for rash and skin lesion changes  Physical Examination: Blood pressure 149/101, pulse 68, temperature 98.2 F (36.8 C), temperature source Oral, resp. rate 13, height 6' (1.829 m), weight 72.576 kg (160 lb), SpO2 97 %.  HEENT-  Normocephalic, no lesions, without obvious  abnormality.  Normal external eye and conjunctiva.  Normal TM's bilaterally.  Normal auditory canals and external ears. Normal external nose, mucus membranes and septum.  Normal pharynx. Neck supple with no masses, nodes, nodules or enlargement. Cardiovascular - regular rate and rhythm, S1, S2 normal, no murmur, click, rub or gallop Lungs - chest clear, no wheezing, rales, normal symmetric air entry Abdomen - soft, non-tender; bowel sounds normal; no masses,  no organomegaly Extremities - no joint deformities, effusion, or inflammation  Neurologic Examination: Mental Status: Alert, active expressive aphasia with severe naming and word finding difficulty. Patient appeared to have no receptive aphasia. Able to follow commands without difficulty. Cranial Nerves: II-Visual fields were normal. III/IV/VI-Pupils were equal and reacted normally to light. Extraocular movements were full and conjugate.    V/VII-no facial numbness; mild right lower facial weakness. VIII-normal. X-normal speech and symmetrical palatal movement. XI: trapezius strength/neck flexion strength normal bilaterally XII-midline tongue extension with normal strength. Motor: 5/5 bilaterally with normal tone and bulk Sensory: Normal throughout. Deep Tendon Reflexes: 2+ and symmetric. Plantars: Flexor bilaterally Cerebellar: Normal finger-to-nose testing. Carotid auscultation: Normal  Results for orders placed or performed during the hospital encounter of 10/27/15 (from the past 48 hour(s))  CBC     Status: None   Collection Time: 10/27/15  2:35 AM  Result Value Ref Range   WBC 5.9 4.0 - 10.5 K/uL   RBC 4.43 4.22 - 5.81 MIL/uL   Hemoglobin 13.0 13.0 - 17.0 g/dL   HCT 39.9 39.0 - 52.0 %   MCV 90.1 78.0 - 100.0 fL   MCH 29.3 26.0 - 34.0 pg   MCHC 32.6 30.0 - 36.0 g/dL   RDW 13.7 11.5 - 15.5 %   Platelets 229 150 - 400 K/uL  Differential     Status: None   Collection Time: 10/27/15  2:35 AM  Result Value Ref Range    Neutrophils Relative % 45 %   Neutro Abs 2.6 1.7 - 7.7 K/uL   Lymphocytes Relative 40 %   Lymphs Abs 2.4 0.7 - 4.0 K/uL   Monocytes Relative 11 %   Monocytes Absolute 0.7 0.1 - 1.0 K/uL   Eosinophils Relative 4 %   Eosinophils Absolute 0.2 0.0 - 0.7 K/uL   Basophils Relative 0 %   Basophils Absolute 0.0 0.0 - 0.1 K/uL  I-Stat Chem 8, ED     Status: Abnormal   Collection Time: 10/27/15  2:42 AM  Result Value Ref Range   Sodium 143 135 - 145 mmol/L   Potassium 3.9 3.5 - 5.1 mmol/L   Chloride 106 101 - 111 mmol/L   BUN 29 (H) 6 - 20 mg/dL   Creatinine, Ser 1.00 0.61 - 1.24 mg/dL   Glucose, Bld 98 65 - 99 mg/dL   Calcium, Ion 1.14 1.13 - 1.30 mmol/L   TCO2 28 0 - 100 mmol/L   Hemoglobin 13.6 13.0 - 17.0 g/dL   HCT 40.0 39.0 - 52.0 %   Ct Head Code Stroke W/o Cm  10/27/2015  CLINICAL DATA:  Slurred speech. EXAM: CT HEAD WITHOUT CONTRAST TECHNIQUE: Contiguous axial images were obtained from  the base of the skull through the vertex without intravenous contrast. COMPARISON:  11/25/2009 FINDINGS: Negative for acute intracranial hemorrhage, mass or evidence of acute infarction. Multifocal patchy white matter hypodensities are present, likely due to chronic small vessel disease. Mild generalized atrophy. No extra-axial fluid collection. No acute findings are evident. No significant bony abnormality. The visible paranasal sinuses are clear. IMPRESSION: Chronic white matter hypodensities and mild generalized atrophy. No acute findings are evident. These results were called by telephone at the time of interpretation on 10/27/2015 at 2:47 am to Dr. Wallie Char, who verbally acknowledged these results. Electronically Signed   By: Andreas Newport M.D.   On: 10/27/2015 02:47    Assessment: 75 y.o. male with multiple risk factors for stroke presenting with probable acute left MCA territory ischemic infarction. However, it's unclear whether patient is left-handed a right-handed. As such, acute stroke  could I'll involve his right hemisphere rather than the left.  Stroke Risk Factors - family history and hyperlipidemia  Plan: 1. HgbA1c, fasting lipid panel 2. MRI, MRA  of the brain without contrast 3. Speech consult 4. Echocardiogram 5. Carotid dopplers 6. Prophylactic therapy-Antiplatelet med: Aspirin  7. Risk factor modification 8. Telemetry monitoring  C.R. Nicole Kindred, MD [facility] [phone]  10/27/2015, 3:01 AM

## 2015-10-27 NOTE — ED Provider Notes (Signed)
CSN: [id number]     Arrival date & time 10/27/15  [id number] History  By signing my name below, I, Antonio Manning, attest that this documentation has been prepared under the direction and in the presence of Jola Schmidt, MD.  Electronically Signed: Julien Nordmann, ED Scribe. 10/27/2015. 3:15 AM.    Chief Complaint  Patient presents with  . Code Stroke      The history is provided by the spouse and the EMS personnel. No language interpreter was used.   HPI Comments: Antonio Manning is a 75 y.o. male brought in by ambulance, who has a PMHx of PONV, skin cancer and hearing loss presents to the Emergency Department presenting with stroke-like symptoms onset around 0200. According to pt's wife, pt's last known normal was around 2130 last night. EMS states pt woke up at 0200 with obvious aphasia and slurred speech. His wife notes she did not notice any facial droop on pt. EMS states pt is able to respond to commands and has no weakness or motor deficits. Pt has no prior hx of strokes.    Past Medical History  Diagnosis Date  . Cholesterol serum elevated June 12, 2011    tx. Lipitor  . Cataract 06/12/2011    right  . PONV (postoperative nausea and vomiting) June 12, 2011    with cataract surgery  . Kidney calculi Jun 12, 2011    past hx. x1-passed on own, mild prostate issues-no meds  . Arthritis June 12, 2011    osteoarthritis,lt. hip, and neck area  . Cancer 06/12/2011    skin cancer lesions-multiple  . Hearing loss 12-Jun-2011    wears hearing aids bilaterally   Past Surgical History  Procedure Laterality Date  . Back surgery  06/12/11    Lumbar disc surgery-no hardware  . Cataract extraction w/ intraocular lens implant  2011-06-12    left eye  . Total hip arthroplasty  05/20/2011    Procedure: TOTAL HIP ARTHROPLASTY ANTERIOR APPROACH;  Surgeon: Mauri Pole, MD;  Location: [facility];  Service: Orthopedics;  Laterality: Left;   Family History  Problem Relation Age of Onset  . Heart Problems Mother   . Cancer Father   .  Cancer Brother   . Heart Problems Brother   . Cancer Brother   . Heart Problems Brother   . Heart Problems Sister   . Hyperlipidemia Sister   . Birth defects Child   . Other Child     one kidney   Social History  Substance Use Topics  . Smoking status: Former Smoker -- 25 years    Types: Cigarettes    Quit date: 1988-06-11  . Smokeless tobacco: Never Used  . Alcohol Use: No    Review of Systems  A complete 10 system review of systems was obtained and all systems are negative except as noted in the HPI and PMH.    Allergies  Codeine  Home Medications   Prior to Admission medications   Medication Sig Start Date End Date Taking? Authorizing Provider  aspirin 81 MG tablet Take 81 mg by mouth daily.    Historical Provider, MD  atorvastatin (LIPITOR) 10 MG tablet Take 10 mg by mouth at bedtime.     Historical Provider, MD  Calcium Carbonate-Vitamin D (CALCIUM + D PO) Take 1 tablet by mouth 2 (two) times daily.      Historical Provider, MD  Glucosamine HCl-Glucosamin SO4 (GLUCOSAMINE COMPLEX PO) Take 1 tablet by mouth 2 (two) times daily.      Historical Provider, MD  Multiple  Vitamin (MULITIVITAMIN WITH MINERALS) TABS Take 1 tablet by mouth daily.      Historical Provider, MD   BP 149/101 mmHg  Pulse 68  Temp(Src) 98.2 F (36.8 C) (Oral)  Resp 13  Ht 6' (1.829 m)  Wt 160 lb (72.576 kg)  BMI 21.70 kg/m2  SpO2 97% Physical Exam  Constitutional: He appears well-developed and well-nourished.  HENT:  Head: Normocephalic and atraumatic.  Eyes: EOM are normal.  Neck: Normal range of motion.  Cardiovascular: Normal rate, regular rhythm, normal heart sounds and intact distal pulses.   Pulmonary/Chest: Effort normal and breath sounds normal. No respiratory distress.  Abdominal: Soft. He exhibits no distension. There is no tenderness.  Musculoskeletal: Normal range of motion.  Neurological: He is alert.  Expressive aphasia.  Moves all 4 extremities equally.  No gross deficit in  upper or lower extremity major muscle groups  Skin: Skin is warm and dry.  Psychiatric: He has a normal mood and affect. Judgment normal.  Nursing note and vitals reviewed.   ED Course  Procedures  DIAGNOSTIC STUDIES: Oxygen Saturation is 97% on RA, normal by my interpretation.  COORDINATION OF CARE:  3:15 AM Discussed treatment plan with pt at bedside and pt agreed to plan.   +++++++++++++++++++++++++++++++++++++++++++++++++++++++++  CRITICAL CARE Performed by: Hoy Morn Total critical care time: 30 minutes Critical care time was exclusive of separately billable procedures and treating other patients. Critical care was necessary to treat or prevent imminent or life-threatening deterioration. Critical care was time spent personally by me on the following activities: development of treatment plan with patient and/or surrogate as well as nursing, discussions with consultants, evaluation of patient's response to treatment, examination of patient, obtaining history from patient or surrogate, ordering and performing treatments and interventions, ordering and review of laboratory studies, ordering and review of radiographic studies, pulse oximetry and re-evaluation of patient's condition.   +++++++++++++++++++++++++++++++++++++++++++++++++++++++++++++  Labs Review  Labs Reviewed  COMPREHENSIVE METABOLIC PANEL - Abnormal; Notable for the following:    Glucose, Bld 104 (*)    BUN 25 (*)    Total Protein 6.4 (*)    ALT 16 (*)    All other components within normal limits  I-STAT CHEM 8, ED - Abnormal; Notable for the following:    BUN 29 (*)    All other components within normal limits  PROTIME-INR  APTT  CBC  DIFFERENTIAL  I-STAT TROPOININ, ED  CBG MONITORING, ED    Imaging Review Ct Head Code Stroke W/o Cm  10/27/2015  CLINICAL DATA:  Slurred speech. EXAM: CT HEAD WITHOUT CONTRAST TECHNIQUE: Contiguous axial images were obtained from the base of the skull through the  vertex without intravenous contrast. COMPARISON:  11/25/2009 FINDINGS: Negative for acute intracranial hemorrhage, mass or evidence of acute infarction. Multifocal patchy white matter hypodensities are present, likely due to chronic small vessel disease. Mild generalized atrophy. No extra-axial fluid collection. No acute findings are evident. No significant bony abnormality. The visible paranasal sinuses are clear. IMPRESSION: Chronic white matter hypodensities and mild generalized atrophy. No acute findings are evident. These results were called by telephone at the time of interpretation on 10/27/2015 at 2:47 am to Dr. Wallie Char, who verbally acknowledged these results. Electronically Signed   By: Andreas Newport M.D.   On: 10/27/2015 02:47   I have personally reviewed and evaluated these images and lab results as part of my medical decision-making.   EKG Interpretation   Date/Time:  Monday October 27 2015 02:51:17 EDT Ventricular Rate:  73 PR Interval:    QRS Duration: 109 QT Interval:  413 QTC Calculation: 456 R Axis:   -78 Text Interpretation:  Sinus rhythm Multiform ventricular premature  complexes Prolonged PR interval Left anterior fascicular block  Anteroseptal infarct, age indeterminate No old tracing to compare  Confirmed by Cieanna Stormes  MD, Lennette Bihari ([id number]) on 10/27/2015 3:23:28 AM      MDM   Final diagnoses:  Stroke-like symptoms  Expressive aphasia    Patient presents to emergency department as an acute code stroke.  Last known well at 9:30 PM.  Patient with expressive aphasia.  No obvious deficit of his arms or legs at this time.  Seen emergently on arrival to emergency department.  Airway clear.  Stat head CT.  Seen by neurology in consultation.  At this time the patient is not a candidate for TPA per neurology.  Will admit to medicine for ongoing workup.  Will need an MRI   I personally performed the services described in this documentation, which was scribed in my presence.  The recorded information has been reviewed and is accurate.      Jola Schmidt, MD 10/27/15 [phone]

## 2015-10-27 NOTE — Progress Notes (Signed)
Patient seen and examined  75 y.o. male with a history of hyperlipidemia, arthritis and hearing loss, brought to the emergency room and code stroke status following acute onset of expressive aphasia, with probable acute left MCA territory ischemic infarction.  Assessment and plan 1. Acute Stroke in patient with expressive aphasia:  This is new. MRI Small acute LEFT frontal lobe nonhemorrhagic infarct -Continue telemetry-shows normal sinus rhythm -Neuro checks, NIHSS per protocol -Daily aspirin 325 mg from 81 mg , patient on aspirin 81 mg a day at home -Lipids LDL 88, triglycerides 66, hemoglobin A1c pending -Carotid doppler, MRA per Neurology, does not show any significant extracranial stenosis -Echocardiogram pending -PT/SLP consultation -pending -Consult to Neurology, appreciate recommendations    2. Hyperlipidemia:  -Continue statin, LDL 88

## 2015-10-27 NOTE — ED Notes (Signed)
CBG is 96.

## 2015-10-27 NOTE — Progress Notes (Signed)
  Echocardiogram 2D Echocardiogram has been performed.  Donata Clay 10/27/2015, 10:56 AM

## 2015-10-27 NOTE — H&P (Signed)
History and Physical  Patient Name: Antonio Manning     [id number]    DOB: November 12, 1940    DOA: 10/27/2015 PCP: Gennette Pac, MD   Patient coming from: Home     Chief Complaint: Aphasia  HPI: Antonio Manning is a 75 y.o. male with a past medical history significant for hyperlipidemia only who presents with aphasia.  The patient was in his normal state of health until tonight, went to bed around 9:30 PM, and woke up at 2 AM unable to speak. His wife brought him straight to the emergency room where code stroke was called, but TPA was not administered because of last known normal greater than 5 hours prior to arrival.  Patient was evaluated by neurology, had no weakness, numbness, cranial nerve deficits, and was able to write short sentences, but could not speak  ED course: -Afebrile, hemodynamically stable, mildly hypertensive -Sodium 142, potassium 4.0, creatinine 1.07, WBC 6K, hemoglobin 13, CT head normal, coags normal, troponin negative, CBG normal -ECG showed sinus rhythm, rate 73, QTC 456, old LAFB, first-degree AV block, and PVCs -Likely stroke,  TRH were asked to admit for stroke workup     Review of Systems:  All other systems negative except as just noted or noted in the history of present illness.  Past Medical History  Diagnosis Date  . Cholesterol serum elevated May 27, 2011    tx. Lipitor  . Cataract 05/27/11    right  . PONV (postoperative nausea and vomiting) May 27, 2011    with cataract surgery  . Kidney calculi 2011/05/27    past hx. x1-passed on own, mild prostate issues-no meds  . Arthritis May 27, 2011    osteoarthritis,lt. hip, and neck area  . Cancer ([place]) 05-27-11    skin cancer lesions-multiple  . Hearing loss 05-27-2011    wears hearing aids bilaterally    Past Surgical History  Procedure Laterality Date  . Back surgery  27-May-2011    Lumbar disc surgery-no hardware  . Cataract extraction w/ intraocular lens implant  05/27/11    left eye  . Total hip arthroplasty   05/20/2011    Procedure: TOTAL HIP ARTHROPLASTY ANTERIOR APPROACH;  Surgeon: Mauri Pole, MD;  Location: [facility];  Service: Orthopedics;  Laterality: Left;    Social History: Patient lives with his wife.  Patient walks unassisted.  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  Plans to golf in father-son invitational in July with his son in [place].  Former smoker, quit many years ago.  From [place].  Went to [employer]. Denies alcohol.   Allergies  Allergen Reactions  . Codeine Nausea And Vomiting    Family history: family history includes Birth defects in his child; Cancer in his brother, brother, and father; Heart Problems in his brother, brother, mother, and sister; Hyperlipidemia in his sister; Other in his child; Stroke in his brother.  Prior to Admission medications   Medication Sig Start Date End Date Taking? Authorizing Provider  aspirin 81 MG tablet Take 81 mg by mouth daily.    Historical Provider, MD  atorvastatin (LIPITOR) 10 MG tablet Take 10 mg by mouth at bedtime.     Historical Provider, MD  Calcium Carbonate-Vitamin D (CALCIUM + D PO) Take 1 tablet by mouth 2 (two) times daily.      Historical Provider, MD  Glucosamine HCl-Glucosamin SO4 (GLUCOSAMINE COMPLEX PO) Take 1 tablet by mouth 2 (two) times daily.      Historical Provider,  MD  Multiple Vitamin (MULITIVITAMIN WITH MINERALS) TABS Take 1 tablet by mouth daily.      Historical Provider, MD     Physical Exam: BP 131/89 mmHg  Pulse 57  Temp(Src) 98.2 F (36.8 C) (Oral)  Resp 16  Ht 6' (1.829 m)  Wt 72.576 kg (160 lb)  BMI 21.70 kg/m2  SpO2 98% General appearance: Well-developed, adult male, alert and in no acute distress.   Eyes: Anicteric, conjunctiva pink, lids and lashes normal.     ENT: No nasal deformity, discharge, or epistaxis.  OP moist without lesions.   Lymph: No cervical, supraclavicular or axillary lymphadenopathy. Skin: Warm and dry.  No jaundice.  No suspicious  rashes or lesions. Cardiac: RRR, nl S1-S2, no murmurs appreciated.  Capillary refill is brisk.  JVP normal.  No LE edema.  Radial and DP pulses 2+ and symmetric. Respiratory: Normal respiratory rate and rhythm.  CTAB without rales or wheezes. Abdomen: Abdomen soft without rigidity.  No TTP. No ascites, distension.   MSK: No deformities or effusions. Neuro: Pupils are 4 mm and reactive to 3 mm. Extraocular movements are intact, without nystagmus. Cranial nerve 5 is within normal limits. Cranial nerve 7 is symmetrical. Cranial nerve 8 is within normal limits. Cranial nerves 9 and 10 reveal equal palate elevation. Cranial nerve 11 reveals sternocleidomastoid strong. Cranial nerve 12 is midline. I do not note a deficit in motor strength testing in the upper and lower extremities bilaterally with normal motor, tone and bulk.  Gait testing deferred. Finger-to-nose testing is within normal limits. The patient is oriented to time, place and person. Speech is slurred and most words he cannot get out.  Receptive language is intact and he is able to write single words or short phrases. Oriented.  Attention span and concentration are within normal limits.   Psych: Behavior appropriate.  Affect pleasant.  No evidence of aural or visual hallucinations or delusions.       Labs on Admission:  I have personally reviewed following labs and imaging studies: CBC:  Recent Labs Lab 10/27/15 0235 10/27/15 0242  WBC 5.9  --   NEUTROABS 2.6  --   HGB 13.0 13.6  HCT 39.9 40.0  MCV 90.1  --   PLT 229  --    Basic Metabolic Panel:  Recent Labs Lab 10/27/15 0235 10/27/15 0242  NA 142 143  K 4.0 3.9  CL 109 106  CO2 27  --   GLUCOSE 104* 98  BUN 25* 29*  CREATININE 1.07 1.00  CALCIUM 9.4  --    GFR: Estimated Creatinine Clearance: 63.5 mL/min (by C-G formula based on Cr of 1).  Liver Function Tests:  Recent Labs Lab 10/27/15 0235  AST 21  ALT 16*  ALKPHOS 56  BILITOT 1.2  PROT 6.4*    ALBUMIN 4.0   Coagulation Profile:  Recent Labs Lab 10/27/15 0235  INR 1.06    CBG:  Recent Labs Lab 10/27/15 0310  GLUCAP 96       Radiological Exams on Admission: CT head without contrast 10/27/2015  NAICP    EKG: Independently reviewed. Rate 73, QTc 453, LAFB and 1st deg AVB, PVCs present, no ST changes.    Assessment/Plan 1. Acute Stroke or TIA in patient with expressive aphasia:  This is new.  MRI pending.   -Admit to telemetry -Neuro checks, NIHSS per protocol -Daily aspirin 325 mg from 81 mg -Lipids, hemoglobin A1c ordered -Carotid doppler, MRA per Neurology, ordered -Echocardiogram ordered -PT/SLP consultation -  Consult to Neurology, appreciate recommendations   2. Hyperlipidemia:  -Continue statin       DVT prophylaxis: Lovenox  Code Status: FULL  Family Communication: Wife at bedside  Disposition Plan: Anticipate Stroke work up as above and consult to ancillary services.  Expect discharge within 1-2 days, depending on findings on MRI and resolution of symptoms. Consults called: Neurology, Dr. Nicole Kindred has seen patient. Admission status: Telemetry, OBS status at present  Core measures: -VTE prophylaxis ordered at time of admission -Aspirin ordered at admission -Atrial fibrillation: not present -tPA not given because of outside TPA window -Dysphagia screen completed in ER -Lipids ordered -PT eval ordered -Former smoker, not presently    Medical decision making: Patient seen at 3:53 AM on 10/27/2015.  The patient was discussed with Dr. Venora Maples. What exists of the patient's chart and outside records from [facility] was reviewed in depth.  Clinical condition: stable.       Edwin Dada [facility] Pager [phone]

## 2015-10-27 NOTE — ED Notes (Signed)
PER [facility]: Patient to ED from home - per wife, patient woke up at 0200 this morning with expressive aphasia and slurred speech. No facial droop or musculoskeletal deficits noted. Patient went to sleep at 2130 last night and was normal. EMS VS: 160/76, HR 68, 97% RA, CBG 87. Patient A&O x 4.

## 2015-10-27 NOTE — Progress Notes (Signed)
STROKE TEAM PROGRESS NOTE   HISTORY OF PRESENT ILLNESS (per record) Antonio Manning is an 75 y.o. male with a history of hyperlipidemia, arthritis and hearing loss, brought to the emergency room and code stroke status following acute onset of expressive aphasia. Patient was last known well at 9:30 PM 10/26/2015. He woke up at 2 AM and was unable to express himself verbally. He can understand well what was being said to him. Right facial droop was noted by his wife. No weakness of extremities was noted. CT scan of his head showed no acute intracranial abnormality. Patient has been taking aspirin daily. NIH stroke score was 4. Patient was beyond time window for treatment with IV TPA when he arrived in the emergency room. He was admitted for further evaluation and treatment.   SUBJECTIVE (INTERVAL HISTORY) His wife is at the bedside.  Overall he feels his aphasia is improving but not back to baseline.    OBJECTIVE Temp:  [98.2 F (36.8 C)] 98.2 F (36.8 C) (06/26 0353) Pulse Rate:  [54-68] 60 (06/26 0945) Cardiac Rhythm:  [-] Heart block (06/26 0749) Resp:  [11-23] 18 (06/26 0945) BP: (107-156)/(60-101) 110/60 mmHg (06/26 0945) SpO2:  [95 %-99 %] 98 % (06/26 0945) Weight:  [72.576 kg (160 lb)] 72.576 kg (160 lb) (06/26 0257)  CBC:  Recent Labs Lab 10/27/15 0235 10/27/15 0242 10/27/15 0831  WBC 5.9  --  4.6  NEUTROABS 2.6  --   --   HGB 13.0 13.6 12.8*  HCT 39.9 40.0 38.4*  MCV 90.1  --  91.0  PLT 229  --  99991111    Basic Metabolic Panel:  Recent Labs Lab 10/27/15 0235 10/27/15 0242 10/27/15 0831  NA 142 143  --   K 4.0 3.9  --   CL 109 106  --   CO2 27  --   --   GLUCOSE 104* 98  --   BUN 25* 29*  --   CREATININE 1.07 1.00 0.89  CALCIUM 9.4  --   --     Lipid Panel:    Component Value Date/Time   CHOL 151 10/27/2015 0831   TRIG 66 10/27/2015 0831   HDL 50 10/27/2015 0831   CHOLHDL 3.0 10/27/2015 0831   VLDL 13 10/27/2015 0831   LDLCALC 88 10/27/2015 0831   HgbA1c: No  results found for: HGBA1C Urine Drug Screen: No results found for: LABOPIA, COCAINSCRNUR, LABBENZ, AMPHETMU, THCU, LABBARB    IMAGING  [name]  10/27/2015  CLINICAL DATA:  Expressive aphasia, last seen normal at 9:30 p.m. RIGHT facial droop. History of cancer. EXAM: MRI HEAD WITHOUT CONTRAST MRA HEAD WITHOUT CONTRAST TECHNIQUE: Multiplanar, multiecho pulse sequences of the brain and surrounding structures were obtained without intravenous contrast. Angiographic images of the head were obtained using MRA technique without contrast. COMPARISON:  CT HEAD October 27, 2015 at 0236 hours FINDINGS: MRI HEAD FINDINGS INTRACRANIAL CONTENTS: Patchy reduced diffusion LEFT posterior frontal lobe measuring up to 13 x 10 mm lobe corresponding low ADC value. No susceptibility artifact to suggest hemorrhage. The ventricles and sulci are normal for patient's age. No suspicious parenchymal signal, masses or mass effect. Patchy supratentorial white matter FLAIR T2 hyperintensities. No abnormal extra-axial fluid collections. No extra-axial masses though, contrast enhanced sequences would be more sensitive. Normal major intracranial vascular flow voids present at skull base. ORBITS: The included ocular globes and orbital contents are non-suspicious. Status post bilateral ocular lens implant. SINUSES: The mastoid air-cells and included paranasal  sinuses are well-aerated. SKULL/SOFT TISSUES: No abnormal sellar expansion. No suspicious calvarial bone marrow signal. Craniocervical junction maintained. MRA HEAD FINDINGS (Moderately motion degraded examination) ANTERIOR CIRCULATION: Normal flow related enhancement of the included cervical, petrous, cavernous and supraclinoid internal carotid arteries. Patent anterior communicating artery. Normal flow related enhancement of the anterior and middle cerebral arteries, including distal segments. No large vessel occlusion, high-grade stenosis,  aneurysm. POSTERIOR  CIRCULATION: Codominant vertebral arteries. Basilar artery is patent, with normal flow related enhancement of the main branch vessels. Normal flow related enhancement of the posterior cerebral arteries. No large vessel occlusion, high-grade stenosis, aneurysm. IMPRESSION: MRI HEAD: Small acute LEFT frontal lobe nonhemorrhagic infarct. Mild to moderate white matter changes compatible with chronic small vessel ischemic disease. MRA HEAD: Moderately motion degraded examination without emergent large vessel occlusion though, limited assessment of mid to distal vessels. Electronically Signed   By: Elon Alas M.D.   On: 10/27/2015 06:29   [name]  10/27/2015  CLINICAL DATA:  Expressive aphasia, last seen normal at 9:30 p.m. RIGHT facial droop. History of cancer. EXAM: MRI HEAD WITHOUT CONTRAST MRA HEAD WITHOUT CONTRAST TECHNIQUE: Multiplanar, multiecho pulse sequences of the brain and surrounding structures were obtained without intravenous contrast. Angiographic images of the head were obtained using MRA technique without contrast. COMPARISON:  CT HEAD October 27, 2015 at 0236 hours FINDINGS: MRI HEAD FINDINGS INTRACRANIAL CONTENTS: Patchy reduced diffusion LEFT posterior frontal lobe measuring up to 13 x 10 mm lobe corresponding low ADC value. No susceptibility artifact to suggest hemorrhage. The ventricles and sulci are normal for patient's age. No suspicious parenchymal signal, masses or mass effect. Patchy supratentorial white matter FLAIR T2 hyperintensities. No abnormal extra-axial fluid collections. No extra-axial masses though, contrast enhanced sequences would be more sensitive. Normal major intracranial vascular flow voids present at skull base. ORBITS: The included ocular globes and orbital contents are non-suspicious. Status post bilateral ocular lens implant. SINUSES: The mastoid air-cells and included paranasal sinuses are well-aerated. SKULL/SOFT TISSUES: No abnormal sellar expansion.  No suspicious calvarial bone marrow signal. Craniocervical junction maintained. MRA HEAD FINDINGS (Moderately motion degraded examination) ANTERIOR CIRCULATION: Normal flow related enhancement of the included cervical, petrous, cavernous and supraclinoid internal carotid arteries. Patent anterior communicating artery. Normal flow related enhancement of the anterior and middle cerebral arteries, including distal segments. No large vessel occlusion, high-grade stenosis,  aneurysm. POSTERIOR CIRCULATION: Codominant vertebral arteries. Basilar artery is patent, with normal flow related enhancement of the main branch vessels. Normal flow related enhancement of the posterior cerebral arteries. No large vessel occlusion, high-grade stenosis, aneurysm. IMPRESSION: MRI HEAD: Small acute LEFT frontal lobe nonhemorrhagic infarct. Mild to moderate white matter changes compatible with chronic small vessel ischemic disease. MRA HEAD: Moderately motion degraded examination without emergent large vessel occlusion though, limited assessment of mid to distal vessels. Electronically Signed   By: Elon Alas M.D.   On: 10/27/2015 06:29   Ct Head Code Stroke W/o Cm  10/27/2015  CLINICAL DATA:  Slurred speech. EXAM: CT HEAD WITHOUT CONTRAST TECHNIQUE: Contiguous axial images were obtained from the base of the skull through the vertex without intravenous contrast. COMPARISON:  11/25/2009 FINDINGS: Negative for acute intracranial hemorrhage, mass or evidence of acute infarction. Multifocal patchy white matter hypodensities are present, likely due to chronic small vessel disease. Mild generalized atrophy. No extra-axial fluid collection. No acute findings are evident. No significant bony abnormality. The visible paranasal sinuses are clear. IMPRESSION: Chronic white matter hypodensities and mild generalized atrophy. No acute findings are  evident. These results were called by telephone at the time of interpretation on 10/27/2015 at  2:47 am to Dr. Wallie Char, who verbally acknowledged these results. Electronically Signed   By: Andreas Newport M.D.   On: 10/27/2015 02:47   2D Echocardiogram  - Left ventricle: The cavity size was normal. There was mild concentric hypertrophy. Systolic function was vigorous. The estimated ejection fraction was in the range of 65% to 70%. Wall motion was normal; there were no regional wall motion abnormalities. Doppler parameters are consistent with abnormal left ventricular relaxation (grade 1 diastolic dysfunction). Doppler parameters are consistent with elevated ventricular end-diastolic filling pressure. - Aortic valve: Trileaflet; normal thickness leaflets. There was mild regurgitation. - Mitral valve: Structurally normal valve. There was mild regurgitation. - Left atrium: The atrium was mildly dilated. - Right ventricle: Systolic function was normal. - Tricuspid valve: There was mild regurgitation. - Pulmonic valve: There was no regurgitation. - Pulmonary arteries: Systolic pressure was within the normal range. - Inferior vena cava: The vessel was normal in size. Impressions:   No cardiac source of emboli was indentified.   PHYSICAL EXAM Pleasant elderly male currently not in distress. . Afebrile. Head is nontraumatic. Neck is supple without bruit.    Cardiac exam no murmur or gallop. Lungs are clear to auscultation. Distal pulses are well felt. Neurological Exam ;  Awake alert mild expressive aphasia with paraphasic errors and word finding difficulties. Good comprehension and naming and repetition. Follows three-step commands. Pupils equal and reactive. Vision acuity and fields seem adequate. Fundi were not visualized. Extraocular moments are full range without nystagmus. Face is symmetric without weakness. Tongue midline. Motor system exam no upper or lower extremity drift. Symmetric and equal strength in all 4 extremities. No focal weakness. Sensation is intact. Coordination is  slow but accurate. Gait was not tested. ASSESSMENT/PLAN [name] is a 75 y.o. male with history of hyperlipidemia, arthritis and hearing loss presenting with expressive aphasia. He did not receive IV t-PA due to delay in arrival.   Stroke:  Small left frontal lobe infarct embolic secondary to unknown source  Resultant  Expressive aphaisa  MRI  Small L frontal lobe infarct. small vessel disease   MRA  No emergent large vessel occlusion  Carotid Doppler  pending   2D Echo  EF 65-70%. No source of embolus   TEE to look for embolic source. Arranged with [facility] for Wednesday (schedule full tomorrow).  If positive for PFO (patent foramen ovale), check bilateral lower extremity venous dopplers to rule out DVT as possible source of stroke. (I have made patient NPO after midnight Wednesday).   If TEE negative, a [place] electrophysiologist will consult and consider placement of an implantable loop recorder to evaluate for atrial fibrillation as etiology of stroke. This has been explained to patient/family by Dr. Leonie Man and they are agreeable.   LDL 88  HgbA1c pending  Lovenox 40 mg sq daily for VTE prophylaxis  Diet Heart Room service appropriate?: Yes; Fluid consistency:: Thin  aspirin 81 mg daily prior to admission, now on aspirin 325 mg daily. Patient concerned about bruising and thin skin. Discussed, but will not change to plavix at this time.   Patient counseled to be compliant with his antithrombotic medications  Ongoing aggressive stroke risk factor management  Therapy recommendations:  No PT needs, OP ST  Disposition:  Return home with wife  Hyperlipidemia  Home meds:  lipitor 10, resumed in hospital  LDL 88, goal < 70  Continue statin at discharge  Other Stroke Risk Factors  Advanced age  Former Cigarette smoker  Hospital day #   [place] for Pager  information 10/27/2015 2:01 PM  I have personally examined this patient, reviewed notes, independently viewed imaging studies, participated in medical decision making and plan of care. I have made any additions or clarifications directly to the above note. Agree with note above. He presented with expressive aphasia due to left MCA branch infarct likely of embolic etiology. He remains at risk for neurological worsening, recurrent stroke, TIA and needs ongoing stroke evaluation and aggressive risk factor modification. Recommend TEE and loop recorder insertion. Long discussion with the bedside with the patient and his wife and answered questions. Greater than 50% time during this 35 minute visit was spent on counseling and coordination of care about stroke risk and risk reduction and stroke prevention  Antony Contras, MD Medical Director [facility] Pager: [phone] 10/27/2015 4:18 PM    To contact Stroke Continuity provider, please refer to [email or web address] After hours, contact General Neurology

## 2015-10-27 NOTE — Evaluation (Signed)
Physical Therapy Evaluation Patient Details Name: Antonio Manning MRN: [id number] DOB: 1940-06-14 Today's Date: 10/27/2015   History of Present Illness  Antonio Manning is a 75 y.o. male brought in by ambulance, who has a PMHx of PONV, skin cancer and hearing loss presents to the Emergency Department presenting with stroke-like symptoms onset around 0200. According to pt's wife, pt's last known normal was around 2130 last night. EMS states pt woke up at 0200 with obvious aphasia and slurred speech.  MRI shows small acute L frontal lobe infarct.  Clinical Impression  Pt scored 22 on Dynamic Gait Index and was able to ambulate in hall without difficulty.  Pt attempting to speak throughout session and was able to get several slurred statements out.  Pt does report intermittent double vision which was not present at beginning of session, but was at end.  Feel pt could benefit from OT consult for more in depth vision assessment.  At this time, from a PT standpoint he does not need skilled services and will d/c.  If this changes, please re-order.    Follow Up Recommendations No PT follow up    Equipment Recommendations  None recommended by PT    Recommendations for Other Services OT consult (more indepth vision assessment)     Precautions / Restrictions Precautions Precautions: None Precaution Comments: expressive aphasia Restrictions Weight Bearing Restrictions: No      Mobility  Bed Mobility Overal bed mobility: Independent                Transfers Overall transfer level: Independent                  Ambulation/Gait Ambulation/Gait assistance: Modified independent (Device/Increase time) Ambulation Distance (Feet): 240 Feet Assistive device: None Gait Pattern/deviations: Step-through pattern     General Gait Details: Pt ambulated hallway without difficulty and performed DGI with score of 22.  In room, pt ambulating backwards.  Tendency to stay on R side of hall, almost  bumping into items (but didn't), but with further assessment, feel this was more due to just wanting to stay on side of hall and [facility] when PT cued him.  Stairs Stairs: Yes Stairs assistance: Supervision Stair Management: Alternating pattern;One rail Left;Forwards Number of Stairs: 10 General stair comments: S only due to pt only has 2 small steps to enter.  Wheelchair Mobility    Modified Rankin (Stroke Patients Only) Modified Rankin (Stroke Patients Only) Pre-Morbid Rankin Score: No symptoms Modified Rankin: Slight disability     Balance                                 Standardized Balance Assessment Standardized Balance Assessment : Dynamic Gait Index   Dynamic Gait Index Level Surface: Normal Change in Gait Speed: Normal Gait with Horizontal Head Turns: Mild Impairment Gait with Vertical Head Turns: Normal Gait and Pivot Turn: Normal Step Over Obstacle: Normal Step Around Obstacles: Normal Steps:  (uses rail) Total Score: 22       Pertinent Vitals/Pain Pain Assessment: No/denies pain    Home Living Family/patient expects to be discharged to:: Private residence Living Arrangements: Spouse/significant other Available Help at Discharge: Family;Available 24 hours/day Type of Home: House Home Access: Stairs to enter Entrance Stairs-Rails: None Entrance Stairs-Number of Steps: 2 (small) Home Layout: One level Home Equipment: Bedside commode;Walker - 2 wheels;Shower seat      Prior Function Level of Independence: Independent  Comments: works some days 7 days/week with [employer]. Enjoys golf.     Hand Dominance        Extremity/Trunk Assessment   Upper Extremity Assessment: Overall WFL for tasks assessed (Pt able to do thumb to finger tip opposition without difficulty)           Lower Extremity Assessment: Overall WFL for tasks assessed      Cervical / Trunk Assessment: Normal  Communication   Communication:  Expressive difficulties;HOH  Cognition Arousal/Alertness: Awake/alert Behavior During Therapy: WFL for tasks assessed/performed Overall Cognitive Status: Within Functional Limits for tasks assessed                      General Comments General comments (skin integrity, edema, etc.): vision screen with peripheral vision and finger to finger and finger to nose without any difficulty. Pt c/o intermittent double vision.  None at beginning of session, but at end reports having double vision. When covered one eye it goes away.      Exercises        Assessment/Plan    PT Assessment Patent does not need any further PT services  PT Diagnosis Difficulty walking   PT Problem List    PT Treatment Interventions     PT Goals (Current goals can be found in the Care Plan section) Acute Rehab PT Goals Patient Stated Goal: To play golf PT Goal Formulation: All assessment and education complete, DC therapy    Frequency     Barriers to discharge        Co-evaluation               End of Session Equipment Utilized During Treatment: Gait belt Activity Tolerance: Patient tolerated treatment well Patient left: in chair;with call bell/phone within reach;with chair alarm set Nurse Communication: Mobility status    Functional Assessment Tool Used: clinical judgement and objective findings Functional Limitation: Mobility: Walking and moving around Mobility: Walking and Moving Around Current Status [phone]): 0 percent impaired, limited or restricted Mobility: Walking and Moving Around Goal Status [phone]): 0 percent impaired, limited or restricted Mobility: Walking and Moving Around Discharge Status [phone]): 0 percent impaired, limited or restricted    Time: 1101-1135 PT Time Calculation (min) (ACUTE ONLY): 34 min   Charges:   PT Evaluation $PT Eval Low Complexity: 1 Procedure PT Treatments $Gait Training: 8-22 mins   PT G Codes:   PT G-Codes **NOT FOR INPATIENT  CLASS** Functional Assessment Tool Used: clinical judgement and objective findings Functional Limitation: Mobility: Walking and moving around Mobility: Walking and Moving Around Current Status (H4742): 0 percent impaired, limited or restricted Mobility: Walking and Moving Around Goal Status (V9563): 0 percent impaired, limited or restricted Mobility: Walking and Moving Around Discharge Status (O7564): 0 percent impaired, limited or restricted    Elania Crowl LUBECK 10/27/2015, 11:58 AM

## 2015-10-27 NOTE — ED Notes (Signed)
Hospitalist at bedside 

## 2015-10-27 NOTE — Evaluation (Signed)
Speech Language Pathology Evaluation Patient Details Name: Antonio Manning MRN: [id number] DOB: [birth year] Today's Date: 10/27/2015 Time: [id number] SLP Time Calculation (min) (ACUTE ONLY): 22 min  Problem List:  Patient Active Problem List   Diagnosis Date Noted  . Expressive aphasia 10/27/2015  . Cerebral thrombosis with cerebral infarction 10/27/2015  . Aphasia 10/27/2015  . Thoracic aorta atherosclerosis ([place]) 04/04/2014  . Hyperlipidemia 04/04/2014  . Fatigue 10/30/2013  . Bradycardia 10/30/2013  . S/P left hip replacement 05/21/2011   Past Medical History:  Past Medical History  Diagnosis Date  . Cholesterol serum elevated 05-17-11    tx. Lipitor  . Cataract 2011-05-17    right  . PONV (postoperative nausea and vomiting) 05-17-11    with cataract surgery  . Kidney calculi 05-17-2011    past hx. x1-passed on own, mild prostate issues-no meds  . Arthritis 05-17-2011    osteoarthritis,lt. hip, and neck area  . Cancer ([place]) May 17, 2011    skin cancer lesions-multiple  . Hearing loss May 17, 2011    wears hearing aids bilaterally  . Expressive aphasia 10/2015  . Stroke [facility]) 10/2015   Past Surgical History:  Past Surgical History  Procedure Laterality Date  . Back surgery  2011-05-17    Lumbar disc surgery-no hardware  . Cataract extraction w/ intraocular lens implant  May 17, 2011    left eye  . Total hip arthroplasty  05/20/2011    Procedure: TOTAL HIP ARTHROPLASTY ANTERIOR APPROACH;  Surgeon: Mauri Pole, MD;  Location: [facility] ORS;  Service: Orthopedics;  Laterality: Left;  Mar[facility] Kitchen Eye surgery      eye lid lift    HPI:  Pt is a 75 y.o. male with PMH significant for hyperlipidemia, presented to ED 6/26 with aphasia. In ED pt was without weakness, able to write short sentences but unable to speak; when able to get words out speech is slurred. MRI showed small acute L posterior frontal lobe infarct. Pt passed RN stroke swallow screen but bedside swallow eval still ordered along with speech/ language eval  as part of stroke workup.    Assessment / Plan / Recommendation Clinical Impression  Pt currently presenting with a moderate expressive aphasia with additional speech characteristics consistent with an oral motor apraxia. Pt demonstrated some oral motor groping/ difficulties initiating speech for automatic speech tasks. Once speech initiated, pt was noted to omit some sounds/ syllables in multisyllable words. Pt able to name 4 out of 6 items in room independently for confrontation naming tasks. Word finding difficulties persist in conversation which consisted of mostly short phrases but is including grammatical markers. Once speech initiated, pt had a tendency to start sentences but then get "stuck" midway through. Pt using writing as a compensatory strategy and is occasionally able to write to respond to questions when unable to find words. Pt benefited from phonemic/ articulatory cues to assist with word finding. No oral motor weakness or sensory deficits noted. Cognition seemed within functional limits for tasks assessed, although difficult to thoroughly assess given aphasia. Pt will benefit from continued speech therapy at next level of care to increase functional communication skills. Pt concerned about how speech/ language skills will impact ability to return to work and very motivated to improve. Wife requesting home health at this time, reporting that it may be difficult to drive to an outpatient rehab. Will continue to follow while pt is in acute care.  Noted that bedside swallow eval was ordered; however, pt passed RN stroke swallow screen. Pt expressed not having difficulties swallowing  and family at bedside confirm; pt currently on regular diet/ thin liquids. No motor/ sensory deficits present to suggest a dysphagia. Please contact SLP if felt that bedside swallow evaluation still needs to be completed.    SLP Assessment  Patient needs continued Speech Lanaguage Pathology Services    Follow Up  Recommendations  Home health SLP     Frequency and Duration min 1 x/week  2 weeks      SLP Evaluation Prior Functioning  Cognitive/Linguistic Baseline: Within functional limits  Lives With: Spouse Available Help at Discharge: Family Vocation:  (lawn care service)   Cognition  Overall Cognitive Status: Within Functional Limits for tasks assessed Arousal/Alertness: Awake/alert Orientation Level: Oriented to person;Oriented to place;Other (comment) (unable to answer time/ situation questions due to aphasia) Attention: Selective;Alternating Selective Attention: Appears intact Alternating Attention: Appears intact Memory: Appears intact Awareness: Appears intact Problem Solving: Appears intact Safety/Judgment: Appears intact    Comprehension  Auditory Comprehension Overall Auditory Comprehension: Appears within functional limits for tasks assessed Yes/No Questions: Within Functional Limits Commands: Within Functional Limits Conversation: Simple    Expression Expression Primary Mode of Expression: Verbal Verbal Expression Overall Verbal Expression: Impaired Initiation: Impaired Automatic Speech: Counting;Month of year;Name Level of Generative/Spontaneous Verbalization: Sentence Repetition: No impairment Naming: Impairment Confrontation: Impaired Convergent: 50-74% accurate Verbal Errors: Phonemic paraphasias;Neologisms;Aware of errors Pragmatics: No impairment Effective Techniques: Phonemic cues;Articulatory cues;Sentence completion Non-Verbal Means of Communication: Writing Other Verbal Expression Comments: sometimes able to write responses, other times same expressive language deficits existed in written modality Written Expression Written Expression: Exceptions to [facility] Self Formulation Ability: Phrase   Oral / Motor  Oral Motor/Sensory Function Overall Oral Motor/Sensory Function: Within functional limits Motor Speech Overall Motor Speech: Impaired Respiration:  Within functional limits Phonation: Normal Resonance: Within functional limits Articulation: Impaired Level of Impairment: Sentence Intelligibility: Intelligibility reduced Word: 75-100% accurate Sentence: 50-74% accurate Conversation: 50-74% accurate Motor Planning: Impaired Level of Impairment: Word Motor Speech Errors: Aware;Groping for words;Inconsistent Effective Techniques: Pacing   GO          Functional Assessment Tool Used: clinical judgment Functional Limitations: Spoken language expressive Spoken Language Expression Current Status [phone]): At least 40 percent but less than 60 percent impaired, limited or restricted Spoken Language Expression Goal Status [phone]): At least 20 percent but less than 40 percent impaired, limited or restricted         Kern Reap, [place], CCC-SLP 10/27/2015, 10:26 AM [phone]

## 2015-10-27 NOTE — ED Notes (Signed)
Hearing aid device given to patients wife.

## 2015-10-27 NOTE — Progress Notes (Signed)
  Echocardiogram 2D Echocardiogram has been performed.  Antonio Manning 10/27/2015, 10:55 AM

## 2015-10-28 ENCOUNTER — Inpatient Hospital Stay (HOSPITAL_COMMUNITY): Payer: Medicare Other

## 2015-10-28 DIAGNOSIS — G459 Transient cerebral ischemic attack, unspecified: Secondary | ICD-10-CM

## 2015-10-28 LAB — HEMOGLOBIN A1C
Hgb A1c MFr Bld: 5.6 % (ref 4.8–5.6)
Mean Plasma Glucose: 114 mg/dL

## 2015-10-28 LAB — GLUCOSE, CAPILLARY
GLUCOSE-CAPILLARY: 124 mg/dL — AB (ref 65–99)
Glucose-Capillary: 103 mg/dL — ABNORMAL HIGH (ref 65–99)
Glucose-Capillary: 91 mg/dL (ref 65–99)
Glucose-Capillary: 89 mg/dL (ref 65–99)

## 2015-10-28 LAB — VAS US CAROTID
LCCADDIAS: -28 cm/s
LCCADSYS: -86 cm/s
LEFT ECA DIAS: -23 cm/s
LEFT VERTEBRAL DIAS: -21 cm/s
LICADSYS: -96 cm/s
Left CCA prox dias: 24 cm/s
Left CCA prox sys: 75 cm/s
Left ICA dist dias: -40 cm/s
Left ICA prox dias: -16 cm/s
Left ICA prox sys: -48 cm/s
RCCADSYS: -78 cm/s
RCCAPDIAS: -23 cm/s
RCCAPSYS: -84 cm/s
RIGHT ECA DIAS: -17 cm/s
RIGHT VERTEBRAL DIAS: -21 cm/s

## 2015-10-28 NOTE — Progress Notes (Addendum)
[facility] PROGRESS NOTE  Antonio Manning [id number] DOB: 25-Aug-1940 DOA: 10/27/2015   PCP: Gennette Pac, MD     Assessment/Plan: Principal Problem:   Cerebral thrombosis with cerebral infarction Active Problems:   Hyperlipidemia   Expressive aphasia   Aphasia   Acute CVA (cerebrovascular accident) [facility])   75 y.o. male with a history of hyperlipidemia, arthritis and hearing loss, brought to the emergency room and code stroke status following acute onset of expressive aphasia. Patient was last known well at 9:30 PM 10/26/2015. He woke up at 2 AM and was unable to express himself verbally. He can understand well what was being said to him. Right facial droop was noted by his wife. No weakness of extremities was noted. CT scan of his head showed no acute intracranial abnormality. Patient has been taking aspirin daily   Assessment and plan 1. Acute Stroke in patient with expressive aphasia:  This is new. MRI Small acute LEFT frontal lobe nonhemorrhagic infarct  -Continue telemetry-shows normal sinus rhythm -Neuro checks, NIHSS per protocol -Continue Daily aspirin 325 mg   , patient on aspirin 81 mg a day at home -Lipids LDL 88, triglycerides 66, hemoglobin A1c  5.6, discontinue Accu-Cheks -Carotid doppler, MRA per Neurology, does not show any significant extracranial stenosis -Echocardiogram EF [id number], grade 1 diastolic dysfunction -PT/SLP consultation -no PT follow-up needed -Neurology Recommend TEE and loop recorder insertion, TEE to be done on Wednesday, venous Doppler to rule out DVT,consider placement of an implantable loop recorder   2. Hyperlipidemia:  -Continue statin, LDL 88  3. Chronic diastolic heart failure without any signs of exacerbation   DVT prophylaxsis Lovenox  Code Status:  Full code     Family Communication: Discussed in detail with the patient, all imaging results, lab results explained to the patient   Disposition Plan:   TEE/ILR tomorrow      Consultants:  Neurology  Cardiology  Procedures:  None  Antibiotics: Anti-infectives    None         HPI/Subjective: No new gross focal neurologic deficits  Objective: Filed Vitals:   10/27/15 2046 10/28/15 0142 10/28/15 0534 10/28/15 0821  BP: 129/85 116/70 121/81 122/74  Pulse: 56 62 56 71  Temp:  98.2 F (36.8 C) 98.6 F (37 C) 98.6 F (37 C)  TempSrc:  Axillary Oral Oral  Resp:  16 18 16   Height:      Weight:      SpO2: 99% 96% 95% 98%    Intake/Output Summary (Last 24 hours) at 10/28/15 1025 Last data filed at 10/28/15 0759  Gross per 24 hour  Intake    240 ml  Output      0 ml  Net    240 ml    Exam:  Examination:  General exam: Appears calm and comfortable  Respiratory system: Clear to auscultation. Respiratory effort normal. Cardiovascular system: S1 & S2 heard, RRR. No JVD, murmurs, rubs, gallops or clicks. No pedal edema. Gastrointestinal system: Abdomen is nondistended, soft and nontender. No organomegaly or masses felt. Normal bowel sounds heard. Central nervous system: Alert and oriented. No focal neurological deficits. Extremities: Symmetric 5 x 5 power. Skin: No rashes, lesions or ulcers Psychiatry: Judgement and insight appear normal. Mood & affect appropriate.     Data Reviewed: I have personally reviewed following labs and imaging studies  Micro Results No results found for this or any previous visit (from the past 240 hour(s)).  Radiology Reports Mr Angiogram Head  Wo Contrast  10/27/2015  CLINICAL DATA:  Expressive aphasia, last seen normal at 9:30 p.m. RIGHT facial droop. History of cancer. EXAM: MRI HEAD WITHOUT CONTRAST MRA HEAD WITHOUT CONTRAST TECHNIQUE: Multiplanar, multiecho pulse sequences of the brain and surrounding structures were obtained without intravenous contrast. Angiographic images of the head were obtained using MRA technique without contrast. COMPARISON:  CT HEAD October 27, 2015 at 0236  hours FINDINGS: MRI HEAD FINDINGS INTRACRANIAL CONTENTS: Patchy reduced diffusion LEFT posterior frontal lobe measuring up to 13 x 10 mm lobe corresponding low ADC value. No susceptibility artifact to suggest hemorrhage. The ventricles and sulci are normal for patient's age. No suspicious parenchymal signal, masses or mass effect. Patchy supratentorial white matter FLAIR T2 hyperintensities. No abnormal extra-axial fluid collections. No extra-axial masses though, contrast enhanced sequences would be more sensitive. Normal major intracranial vascular flow voids present at skull base. ORBITS: The included ocular globes and orbital contents are non-suspicious. Status post bilateral ocular lens implant. SINUSES: The mastoid air-cells and included paranasal sinuses are well-aerated. SKULL/SOFT TISSUES: No abnormal sellar expansion. No suspicious calvarial bone marrow signal. Craniocervical junction maintained. MRA HEAD FINDINGS (Moderately motion degraded examination) ANTERIOR CIRCULATION: Normal flow related enhancement of the included cervical, petrous, cavernous and supraclinoid internal carotid arteries. Patent anterior communicating artery. Normal flow related enhancement of the anterior and middle cerebral arteries, including distal segments. No large vessel occlusion, high-grade stenosis,  aneurysm. POSTERIOR CIRCULATION: Codominant vertebral arteries. Basilar artery is patent, with normal flow related enhancement of the main branch vessels. Normal flow related enhancement of the posterior cerebral arteries. No large vessel occlusion, high-grade stenosis, aneurysm. IMPRESSION: MRI HEAD: Small acute LEFT frontal lobe nonhemorrhagic infarct. Mild to moderate white matter changes compatible with chronic small vessel ischemic disease. MRA HEAD: Moderately motion degraded examination without emergent large vessel occlusion though, limited assessment of mid to distal vessels. Electronically Signed   By: Elon Alas M.D.   On: 10/27/2015 06:29   [name]  10/27/2015  CLINICAL DATA:  Expressive aphasia, last seen normal at 9:30 p.m. RIGHT facial droop. History of cancer. EXAM: MRI HEAD WITHOUT CONTRAST MRA HEAD WITHOUT CONTRAST TECHNIQUE: Multiplanar, multiecho pulse sequences of the brain and surrounding structures were obtained without intravenous contrast. Angiographic images of the head were obtained using MRA technique without contrast. COMPARISON:  CT HEAD October 27, 2015 at 0236 hours FINDINGS: MRI HEAD FINDINGS INTRACRANIAL CONTENTS: Patchy reduced diffusion LEFT posterior frontal lobe measuring up to 13 x 10 mm lobe corresponding low ADC value. No susceptibility artifact to suggest hemorrhage. The ventricles and sulci are normal for patient's age. No suspicious parenchymal signal, masses or mass effect. Patchy supratentorial white matter FLAIR T2 hyperintensities. No abnormal extra-axial fluid collections. No extra-axial masses though, contrast enhanced sequences would be more sensitive. Normal major intracranial vascular flow voids present at skull base. ORBITS: The included ocular globes and orbital contents are non-suspicious. Status post bilateral ocular lens implant. SINUSES: The mastoid air-cells and included paranasal sinuses are well-aerated. SKULL/SOFT TISSUES: No abnormal sellar expansion. No suspicious calvarial bone marrow signal. Craniocervical junction maintained. MRA HEAD FINDINGS (Moderately motion degraded examination) ANTERIOR CIRCULATION: Normal flow related enhancement of the included cervical, petrous, cavernous and supraclinoid internal carotid arteries. Patent anterior communicating artery. Normal flow related enhancement of the anterior and middle cerebral arteries, including distal segments. No large vessel occlusion, high-grade stenosis,  aneurysm. POSTERIOR CIRCULATION: Codominant vertebral arteries. Basilar artery is patent, with normal flow related enhancement of the  main branch vessels. Normal  flow related enhancement of the posterior cerebral arteries. No large vessel occlusion, high-grade stenosis, aneurysm. IMPRESSION: MRI HEAD: Small acute LEFT frontal lobe nonhemorrhagic infarct. Mild to moderate white matter changes compatible with chronic small vessel ischemic disease. MRA HEAD: Moderately motion degraded examination without emergent large vessel occlusion though, limited assessment of mid to distal vessels. Electronically Signed   By: Elon Alas M.D.   On: 10/27/2015 06:29   Ct Head Code Stroke W/o Cm  10/27/2015  CLINICAL DATA:  Slurred speech. EXAM: CT HEAD WITHOUT CONTRAST TECHNIQUE: Contiguous axial images were obtained from the base of the skull through the vertex without intravenous contrast. COMPARISON:  11/25/2009 FINDINGS: Negative for acute intracranial hemorrhage, mass or evidence of acute infarction. Multifocal patchy white matter hypodensities are present, likely due to chronic small vessel disease. Mild generalized atrophy. No extra-axial fluid collection. No acute findings are evident. No significant bony abnormality. The visible paranasal sinuses are clear. IMPRESSION: Chronic white matter hypodensities and mild generalized atrophy. No acute findings are evident. These results were called by telephone at the time of interpretation on 10/27/2015 at 2:47 am to Dr. Wallie Char, who verbally acknowledged these results. Electronically Signed   By: Andreas Newport M.D.   On: 10/27/2015 02:47     CBC  Recent Labs Lab 10/27/15 0235 10/27/15 0242 10/27/15 0831  WBC 5.9  --  4.6  HGB 13.0 13.6 12.8*  HCT 39.9 40.0 38.4*  PLT 229  --  205  MCV 90.1  --  91.0  MCH 29.3  --  30.3  MCHC 32.6  --  33.3  RDW 13.7  --  13.7  LYMPHSABS 2.4  --   --   MONOABS 0.7  --   --   EOSABS 0.2  --   --   BASOSABS 0.0  --   --     Chemistries   Recent Labs Lab 10/27/15 0235 10/27/15 0242 10/27/15 0831  NA 142 143  --   K 4.0 3.9  --    CL 109 106  --   CO2 27  --   --   GLUCOSE 104* 98  --   BUN 25* 29*  --   CREATININE 1.07 1.00 0.89  CALCIUM 9.4  --   --   AST 21  --   --   ALT 16*  --   --   ALKPHOS 56  --   --   BILITOT 1.2  --   --    ------------------------------------------------------------------------------------------------------------------ estimated creatinine clearance is 71.4 mL/min (by C-G formula based on Cr of 0.89). ------------------------------------------------------------------------------------------------------------------  Recent Labs  10/27/15 0831  HGBA1C 5.6   ------------------------------------------------------------------------------------------------------------------  Recent Labs  10/27/15 0831  CHOL 151  HDL 50  LDLCALC 88  TRIG 66  CHOLHDL 3.0   ------------------------------------------------------------------------------------------------------------------ No results for input(s): TSH, T4TOTAL, T3FREE, THYROIDAB in the last 72 hours.  Invalid input(s): FREET3 ------------------------------------------------------------------------------------------------------------------ No results for input(s): VITAMINB12, FOLATE, FERRITIN, TIBC, IRON, RETICCTPCT in the last 72 hours.  Coagulation profile  Recent Labs Lab 10/27/15 0235  INR 1.06    No results for input(s): DDIMER in the last 72 hours.  Cardiac Enzymes  Recent Labs Lab 10/27/15 1000  TROPONINI <0.03   ------------------------------------------------------------------------------------------------------------------ Invalid input(s): POCBNP   CBG:  Recent Labs Lab 10/27/15 0310 10/27/15 2150 10/28/15 0627  GLUCAP 96 109* 103*       Studies: [name]  10/27/2015  CLINICAL DATA:  Expressive aphasia, last seen normal at 9:30  p.m. RIGHT facial droop. History of cancer. EXAM: MRI HEAD WITHOUT CONTRAST MRA HEAD WITHOUT CONTRAST TECHNIQUE: Multiplanar, multiecho pulse  sequences of the brain and surrounding structures were obtained without intravenous contrast. Angiographic images of the head were obtained using MRA technique without contrast. COMPARISON:  CT HEAD October 27, 2015 at 0236 hours FINDINGS: MRI HEAD FINDINGS INTRACRANIAL CONTENTS: Patchy reduced diffusion LEFT posterior frontal lobe measuring up to 13 x 10 mm lobe corresponding low ADC value. No susceptibility artifact to suggest hemorrhage. The ventricles and sulci are normal for patient's age. No suspicious parenchymal signal, masses or mass effect. Patchy supratentorial white matter FLAIR T2 hyperintensities. No abnormal extra-axial fluid collections. No extra-axial masses though, contrast enhanced sequences would be more sensitive. Normal major intracranial vascular flow voids present at skull base. ORBITS: The included ocular globes and orbital contents are non-suspicious. Status post bilateral ocular lens implant. SINUSES: The mastoid air-cells and included paranasal sinuses are well-aerated. SKULL/SOFT TISSUES: No abnormal sellar expansion. No suspicious calvarial bone marrow signal. Craniocervical junction maintained. MRA HEAD FINDINGS (Moderately motion degraded examination) ANTERIOR CIRCULATION: Normal flow related enhancement of the included cervical, petrous, cavernous and supraclinoid internal carotid arteries. Patent anterior communicating artery. Normal flow related enhancement of the anterior and middle cerebral arteries, including distal segments. No large vessel occlusion, high-grade stenosis,  aneurysm. POSTERIOR CIRCULATION: Codominant vertebral arteries. Basilar artery is patent, with normal flow related enhancement of the main branch vessels. Normal flow related enhancement of the posterior cerebral arteries. No large vessel occlusion, high-grade stenosis, aneurysm. IMPRESSION: MRI HEAD: Small acute LEFT frontal lobe nonhemorrhagic infarct. Mild to moderate white matter changes compatible with  chronic small vessel ischemic disease. MRA HEAD: Moderately motion degraded examination without emergent large vessel occlusion though, limited assessment of mid to distal vessels. Electronically Signed   By: Elon Alas M.D.   On: 10/27/2015 06:29   [name]  10/27/2015  CLINICAL DATA:  Expressive aphasia, last seen normal at 9:30 p.m. RIGHT facial droop. History of cancer. EXAM: MRI HEAD WITHOUT CONTRAST MRA HEAD WITHOUT CONTRAST TECHNIQUE: Multiplanar, multiecho pulse sequences of the brain and surrounding structures were obtained without intravenous contrast. Angiographic images of the head were obtained using MRA technique without contrast. COMPARISON:  CT HEAD October 27, 2015 at 0236 hours FINDINGS: MRI HEAD FINDINGS INTRACRANIAL CONTENTS: Patchy reduced diffusion LEFT posterior frontal lobe measuring up to 13 x 10 mm lobe corresponding low ADC value. No susceptibility artifact to suggest hemorrhage. The ventricles and sulci are normal for patient's age. No suspicious parenchymal signal, masses or mass effect. Patchy supratentorial white matter FLAIR T2 hyperintensities. No abnormal extra-axial fluid collections. No extra-axial masses though, contrast enhanced sequences would be more sensitive. Normal major intracranial vascular flow voids present at skull base. ORBITS: The included ocular globes and orbital contents are non-suspicious. Status post bilateral ocular lens implant. SINUSES: The mastoid air-cells and included paranasal sinuses are well-aerated. SKULL/SOFT TISSUES: No abnormal sellar expansion. No suspicious calvarial bone marrow signal. Craniocervical junction maintained. MRA HEAD FINDINGS (Moderately motion degraded examination) ANTERIOR CIRCULATION: Normal flow related enhancement of the included cervical, petrous, cavernous and supraclinoid internal carotid arteries. Patent anterior communicating artery. Normal flow related enhancement of the anterior and middle cerebral  arteries, including distal segments. No large vessel occlusion, high-grade stenosis,  aneurysm. POSTERIOR CIRCULATION: Codominant vertebral arteries. Basilar artery is patent, with normal flow related enhancement of the main branch vessels. Normal flow related enhancement of the posterior cerebral arteries. No large vessel occlusion, high-grade stenosis, aneurysm.  IMPRESSION: MRI HEAD: Small acute LEFT frontal lobe nonhemorrhagic infarct. Mild to moderate white matter changes compatible with chronic small vessel ischemic disease. MRA HEAD: Moderately motion degraded examination without emergent large vessel occlusion though, limited assessment of mid to distal vessels. Electronically Signed   By: Elon Alas M.D.   On: 10/27/2015 06:29   Ct Head Code Stroke W/o Cm  10/27/2015  CLINICAL DATA:  Slurred speech. EXAM: CT HEAD WITHOUT CONTRAST TECHNIQUE: Contiguous axial images were obtained from the base of the skull through the vertex without intravenous contrast. COMPARISON:  11/25/2009 FINDINGS: Negative for acute intracranial hemorrhage, mass or evidence of acute infarction. Multifocal patchy white matter hypodensities are present, likely due to chronic small vessel disease. Mild generalized atrophy. No extra-axial fluid collection. No acute findings are evident. No significant bony abnormality. The visible paranasal sinuses are clear. IMPRESSION: Chronic white matter hypodensities and mild generalized atrophy. No acute findings are evident. These results were called by telephone at the time of interpretation on 10/27/2015 at 2:47 am to Dr. Wallie Char, who verbally acknowledged these results. Electronically Signed   By: Andreas Newport M.D.   On: 10/27/2015 02:47      Lab Results  Component Value Date   HGBA1C 5.6 10/27/2015   Lab Results  Component Value Date   LDLCALC 88 10/27/2015   CREATININE 0.89 10/27/2015       Scheduled Meds: .  stroke: mapping our early stages of recovery  book   Does not apply Once  . aspirin  300 mg Rectal Daily   Or  . aspirin  325 mg Oral Daily  . atorvastatin  10 mg Oral QHS  . enoxaparin (LOVENOX) injection  40 mg Subcutaneous Q24H   Continuous Infusions:    LOS: 1 day    Time spent: >30 MINS    [facility]  [facility] Pager [phone]. If 7PM-7AM, please contact night-coverage at [email or web address], password [email or web address] 10/28/2015, 10:25 AM  LOS: 1 day

## 2015-10-28 NOTE — Progress Notes (Signed)
Preliminary results by tech - Carotid Duplex Completed. No evidence of a significant stenosis in bilateral carotid arteries. Vertebral arteries demonstrate antegrade flow.  Oda Cogan, BS, RDMS, RVT

## 2015-10-28 NOTE — Progress Notes (Signed)
STROKE TEAM PROGRESS NOTE   SUBJECTIVE (INTERVAL HISTORY) His wife is at the bedside.  His language is improving, able to name better today than yesterday   OBJECTIVE Temp:  [98.2 F (36.8 C)-98.6 F (37 C)] 98.6 F (37 C) (06/27 0821) Pulse Rate:  [52-71] 71 (06/27 0821) Cardiac Rhythm:  [-] Heart block (06/27 0700) Resp:  [16-18] 16 (06/27 0821) BP: (102-129)/(70-85) 122/74 mmHg (06/27 0821) SpO2:  [95 %-100 %] 98 % (06/27 0821)  CBC:   Recent Labs Lab 10/27/15 0235 10/27/15 0242 10/27/15 0831  WBC 5.9  --  4.6  NEUTROABS 2.6  --   --   HGB 13.0 13.6 12.8*  HCT 39.9 40.0 38.4*  MCV 90.1  --  91.0  PLT 229  --  99991111    Basic Metabolic Panel:   Recent Labs Lab 10/27/15 0235 10/27/15 0242 10/27/15 0831  NA 142 143  --   K 4.0 3.9  --   CL 109 106  --   CO2 27  --   --   GLUCOSE 104* 98  --   BUN 25* 29*  --   CREATININE 1.07 1.00 0.89  CALCIUM 9.4  --   --     Lipid Panel:     Component Value Date/Time   CHOL 151 10/27/2015 0831   TRIG 66 10/27/2015 0831   HDL 50 10/27/2015 0831   CHOLHDL 3.0 10/27/2015 0831   VLDL 13 10/27/2015 0831   LDLCALC 88 10/27/2015 0831   HgbA1c:  Lab Results  Component Value Date   HGBA1C 5.6 10/27/2015   Urine Drug Screen: No results found for: LABOPIA, COCAINSCRNUR, LABBENZ, AMPHETMU, THCU, LABBARB    IMAGING  [name]  10/27/2015  CLINICAL DATA:  Expressive aphasia, last seen normal at 9:30 p.m. RIGHT facial droop. History of cancer. EXAM: MRI HEAD WITHOUT CONTRAST MRA HEAD WITHOUT CONTRAST TECHNIQUE: Multiplanar, multiecho pulse sequences of the brain and surrounding structures were obtained without intravenous contrast. Angiographic images of the head were obtained using MRA technique without contrast. COMPARISON:  CT HEAD October 27, 2015 at 0236 hours FINDINGS: MRI HEAD FINDINGS INTRACRANIAL CONTENTS: Patchy reduced diffusion LEFT posterior frontal lobe measuring up to 13 x 10 mm lobe corresponding  low ADC value. No susceptibility artifact to suggest hemorrhage. The ventricles and sulci are normal for patient's age. No suspicious parenchymal signal, masses or mass effect. Patchy supratentorial white matter FLAIR T2 hyperintensities. No abnormal extra-axial fluid collections. No extra-axial masses though, contrast enhanced sequences would be more sensitive. Normal major intracranial vascular flow voids present at skull base. ORBITS: The included ocular globes and orbital contents are non-suspicious. Status post bilateral ocular lens implant. SINUSES: The mastoid air-cells and included paranasal sinuses are well-aerated. SKULL/SOFT TISSUES: No abnormal sellar expansion. No suspicious calvarial bone marrow signal. Craniocervical junction maintained. MRA HEAD FINDINGS (Moderately motion degraded examination) ANTERIOR CIRCULATION: Normal flow related enhancement of the included cervical, petrous, cavernous and supraclinoid internal carotid arteries. Patent anterior communicating artery. Normal flow related enhancement of the anterior and middle cerebral arteries, including distal segments. No large vessel occlusion, high-grade stenosis,  aneurysm. POSTERIOR CIRCULATION: Codominant vertebral arteries. Basilar artery is patent, with normal flow related enhancement of the main branch vessels. Normal flow related enhancement of the posterior cerebral arteries. No large vessel occlusion, high-grade stenosis, aneurysm. IMPRESSION: MRI HEAD: Small acute LEFT frontal lobe nonhemorrhagic infarct. Mild to moderate white matter changes compatible with chronic small vessel ischemic disease. MRA HEAD: Moderately motion degraded  examination without emergent large vessel occlusion though, limited assessment of mid to distal vessels. Electronically Signed   By: Elon Alas M.D.   On: 10/27/2015 06:29   [name]  10/27/2015  CLINICAL DATA:  Expressive aphasia, last seen normal at 9:30 p.m. RIGHT facial droop.  History of cancer. EXAM: MRI HEAD WITHOUT CONTRAST MRA HEAD WITHOUT CONTRAST TECHNIQUE: Multiplanar, multiecho pulse sequences of the brain and surrounding structures were obtained without intravenous contrast. Angiographic images of the head were obtained using MRA technique without contrast. COMPARISON:  CT HEAD October 27, 2015 at 0236 hours FINDINGS: MRI HEAD FINDINGS INTRACRANIAL CONTENTS: Patchy reduced diffusion LEFT posterior frontal lobe measuring up to 13 x 10 mm lobe corresponding low ADC value. No susceptibility artifact to suggest hemorrhage. The ventricles and sulci are normal for patient's age. No suspicious parenchymal signal, masses or mass effect. Patchy supratentorial white matter FLAIR T2 hyperintensities. No abnormal extra-axial fluid collections. No extra-axial masses though, contrast enhanced sequences would be more sensitive. Normal major intracranial vascular flow voids present at skull base. ORBITS: The included ocular globes and orbital contents are non-suspicious. Status post bilateral ocular lens implant. SINUSES: The mastoid air-cells and included paranasal sinuses are well-aerated. SKULL/SOFT TISSUES: No abnormal sellar expansion. No suspicious calvarial bone marrow signal. Craniocervical junction maintained. MRA HEAD FINDINGS (Moderately motion degraded examination) ANTERIOR CIRCULATION: Normal flow related enhancement of the included cervical, petrous, cavernous and supraclinoid internal carotid arteries. Patent anterior communicating artery. Normal flow related enhancement of the anterior and middle cerebral arteries, including distal segments. No large vessel occlusion, high-grade stenosis,  aneurysm. POSTERIOR CIRCULATION: Codominant vertebral arteries. Basilar artery is patent, with normal flow related enhancement of the main branch vessels. Normal flow related enhancement of the posterior cerebral arteries. No large vessel occlusion, high-grade stenosis, aneurysm. IMPRESSION: MRI  HEAD: Small acute LEFT frontal lobe nonhemorrhagic infarct. Mild to moderate white matter changes compatible with chronic small vessel ischemic disease. MRA HEAD: Moderately motion degraded examination without emergent large vessel occlusion though, limited assessment of mid to distal vessels. Electronically Signed   By: Elon Alas M.D.   On: 10/27/2015 06:29   Ct Head Code Stroke W/o Cm  10/27/2015  CLINICAL DATA:  Slurred speech. EXAM: CT HEAD WITHOUT CONTRAST TECHNIQUE: Contiguous axial images were obtained from the base of the skull through the vertex without intravenous contrast. COMPARISON:  11/25/2009 FINDINGS: Negative for acute intracranial hemorrhage, mass or evidence of acute infarction. Multifocal patchy white matter hypodensities are present, likely due to chronic small vessel disease. Mild generalized atrophy. No extra-axial fluid collection. No acute findings are evident. No significant bony abnormality. The visible paranasal sinuses are clear. IMPRESSION: Chronic white matter hypodensities and mild generalized atrophy. No acute findings are evident. These results were called by telephone at the time of interpretation on 10/27/2015 at 2:47 am to Dr. Wallie Char, who verbally acknowledged these results. Electronically Signed   By: Andreas Newport M.D.   On: 10/27/2015 02:47   2D Echocardiogram  - Left ventricle: The cavity size was normal. There was mild concentric hypertrophy. Systolic function was vigorous. The estimated ejection fraction was in the range of 65% to 70%. Wall motion was normal; there were no regional wall motion abnormalities. Doppler parameters are consistent with abnormal left ventricular relaxation (grade 1 diastolic dysfunction). Doppler parameters are consistent with elevated ventricular end-diastolic filling pressure. - Aortic valve: Trileaflet; normal thickness leaflets. There was mild regurgitation. - Mitral valve: Structurally normal valve. There was mild  regurgitation. -  Left atrium: The atrium was mildly dilated. - Right ventricle: Systolic function was normal. - Tricuspid valve: There was mild regurgitation. - Pulmonic valve: There was no regurgitation. - Pulmonary arteries: Systolic pressure was within the normal range. - Inferior vena cava: The vessel was normal in size. Impressions:   No cardiac source of emboli was indentified.   PHYSICAL EXAM Pleasant elderly male currently not in distress. . Afebrile. Head is nontraumatic. Neck is supple without bruit.    Cardiac exam no murmur or gallop. Lungs are clear to auscultation. Distal pulses are well felt. Neurological Exam ;  Awake alert mild expressive aphasia with paraphasic errors and word finding difficulties. Good comprehension and naming and repetition. Follows three-step commands. Pupils equal and reactive. Vision acuity and fields seem adequate. Fundi were not visualized. Extraocular moments are full range without nystagmus. Face is symmetric without weakness. Tongue midline. Motor system exam no upper or lower extremity drift. Symmetric and equal strength in all 4 extremities. No focal weakness. Sensation is intact. Coordination is slow but accurate. Gait was not tested.   ASSESSMENT/PLAN Antonio Manning is a 75 y.o. male with history of hyperlipidemia, arthritis and hearing loss presenting with expressive aphasia. He did not receive IV t-PA due to delay in arrival.   Stroke:  Small left frontal lobe infarct embolic secondary to unknown source  Resultant  Expressive aphaisa  MRI  Small L frontal lobe infarct. small vessel disease   MRA  No emergent large vessel occlusion  Carotid Doppler  pending   2D Echo  EF 65-70%. No source of embolus   TEE to look for embolic source. Arranged with [place] for Wednesday (schedule full tomorrow).  If positive for PFO (patent foramen ovale), check bilateral lower extremity venous dopplers to rule out DVT as  possible source of stroke. (I have made patient NPO after midnight Wednesday).   If TEE negative, a [place] electrophysiologist will consult and consider placement of an implantable loop recorder to evaluate for atrial fibrillation as etiology of stroke. This has been explained to patient/family by Dr. Leonie Man and they are agreeable.   LDL 88  HgbA1c 5.6  Lovenox 40 mg sq daily for VTE prophylaxis Diet Heart Room service appropriate?: Yes; Fluid consistency:: Thin Diet NPO time specified  aspirin 81 mg daily prior to admission, now on aspirin 325 mg daily. Patient concerned about bruising and thin skin. Discussed, but will not change to plavix at this time.   Patient counseled to be compliant with his antithrombotic medications  Ongoing aggressive stroke risk factor management  Therapy recommendations:  No PT needs, OP ST  Disposition:  Return home with wife  Hyperlipidemia  Home meds:  lipitor 10, resumed in hospital  LDL 88, goal < 70  Continue statin at discharge  Other Stroke Risk Factors  Advanced age  Former Cigarette smoker  Hospital day # [place] for Pager information 10/28/2015 12:10 PM  I have personally examined this patient, reviewed notes, independently viewed imaging studies, participated in medical decision making and plan of care. I have made any additions or clarifications directly to the above note. Agree with note above.  Patient has mild expressive aphasia which is persistent. Await TEE and loop recorder tomorrow. Discussed with wife and answered questions  Antony Contras, MD Medical Director [place] Pager: [phone] 10/28/2015 4:08 PM    To contact Stroke Continuity provider, please  refer to [email or web address] After hours, contact General Neurology

## 2015-10-28 NOTE — Care Management Important Message (Signed)
Important Message  Patient Details  Name: Antonio Manning MRN: [id number] Date of Birth: 18-Jul-1940   Medicare Important Message Given:  Yes    [name] 10/28/2015, 8:11 AM

## 2015-10-29 ENCOUNTER — Encounter (HOSPITAL_COMMUNITY)
Admission: EM | Disposition: A | Discharge status: Home Health | Payer: Self-pay | Source: Home / Self Care | Attending: Internal Medicine

## 2015-10-29 ENCOUNTER — Inpatient Hospital Stay (HOSPITAL_COMMUNITY): Payer: Medicare Other

## 2015-10-29 ENCOUNTER — Encounter (HOSPITAL_COMMUNITY): Payer: Self-pay | Admitting: *Deleted

## 2015-10-29 DIAGNOSIS — I639 Cerebral infarction, unspecified: Secondary | ICD-10-CM

## 2015-10-29 DIAGNOSIS — I340 Nonrheumatic mitral (valve) insufficiency: Secondary | ICD-10-CM

## 2015-10-29 DIAGNOSIS — I6330 Cerebral infarction due to thrombosis of unspecified cerebral artery: Secondary | ICD-10-CM

## 2015-10-29 DIAGNOSIS — Q211 Atrial septal defect: Secondary | ICD-10-CM

## 2015-10-29 HISTORY — PX: TEE WITHOUT CARDIOVERSION: SHX5443

## 2015-10-29 HISTORY — PX: EP IMPLANTABLE DEVICE: SHX172B

## 2015-10-29 SURGERY — LOOP RECORDER INSERTION

## 2015-10-29 SURGERY — ECHOCARDIOGRAM, TRANSESOPHAGEAL
Anesthesia: Moderate Sedation

## 2015-10-29 MED ORDER — MIDAZOLAM HCL 5 MG/ML IJ SOLN
INTRAMUSCULAR | Status: AC
  Filled 2015-10-29: qty 2

## 2015-10-29 MED ORDER — ACETAMINOPHEN 325 MG PO TABS: 325.0000 mg | ORAL_TABLET | ORAL | Status: DC | PRN

## 2015-10-29 MED ORDER — FENTANYL CITRATE (PF) 100 MCG/2ML IJ SOLN
INTRAMUSCULAR | Status: AC
  Filled 2015-10-29: qty 2

## 2015-10-29 MED ORDER — MIDAZOLAM HCL 10 MG/2ML IJ SOLN
INTRAMUSCULAR | Status: DC | PRN
  Administered 2015-10-29 (×2): 2 mg via INTRAVENOUS

## 2015-10-29 MED ORDER — BUTAMBEN-TETRACAINE-BENZOCAINE 2-2-14 % EX AERO
INHALATION_SPRAY | CUTANEOUS | Status: DC | PRN
  Administered 2015-10-29: 1 via TOPICAL
  Administered 2015-10-29: 2 via TOPICAL

## 2015-10-29 MED ORDER — HEPARIN SODIUM (PORCINE) 5000 UNIT/ML IJ SOLN: 5000.0000 [IU] | Freq: Three times a day (TID) | INTRAMUSCULAR | Status: DC

## 2015-10-29 MED ORDER — IBUPROFEN 200 MG PO TABS: 600.0000 mg | ORAL_TABLET | Freq: Four times a day (QID) | ORAL | Status: DC | PRN

## 2015-10-29 MED ORDER — LIDOCAINE-EPINEPHRINE 1 %-1:100000 IJ SOLN
INTRAMUSCULAR | Status: AC
  Filled 2015-10-29: qty 1

## 2015-10-29 MED ORDER — LIDOCAINE-EPINEPHRINE 1 %-1:100000 IJ SOLN
INTRAMUSCULAR | Status: DC | PRN
  Administered 2015-10-29: 20 mL

## 2015-10-29 MED ORDER — SODIUM CHLORIDE 0.9 % IV SOLN: INTRAVENOUS | Status: DC

## 2015-10-29 MED ORDER — ASPIRIN 325 MG PO TABS: 325.0000 mg | ORAL_TABLET | Freq: Every day | ORAL | Status: DC

## 2015-10-29 MED ORDER — ONDANSETRON HCL 4 MG/2ML IJ SOLN: 4.0000 mg | Freq: Four times a day (QID) | INTRAMUSCULAR | Status: DC | PRN

## 2015-10-29 MED ORDER — FENTANYL CITRATE (PF) 100 MCG/2ML IJ SOLN
INTRAMUSCULAR | Status: DC | PRN
  Administered 2015-10-29: 12.5 ug via INTRAVENOUS
  Administered 2015-10-29: 25 ug via INTRAVENOUS

## 2015-10-29 SURGICAL SUPPLY — 2 items
LOOP REVEAL LINQSYS (Prosthesis & Implant Heart) ×2 IMPLANT
PACK LOOP INSERTION (CUSTOM PROCEDURE TRAY) ×3 IMPLANT

## 2015-10-29 NOTE — Progress Notes (Signed)
*  PRELIMINARY RESULTS* Echocardiogram Echocardiogram Transesophageal has been performed.  Philipp Deputy 10/29/2015, 12:29 PM

## 2015-10-29 NOTE — Progress Notes (Signed)
*  PRELIMINARY RESULTS* Vascular Ultrasound Lower extremity venous duplex has been completed.  Preliminary findings: No evidence of DVT or baker's cyst.   Landry Mellow, RDMS, RVT  10/29/2015, 2:15 PM

## 2015-10-29 NOTE — Progress Notes (Signed)
STROKE TEAM PROGRESS NOTE   SUBJECTIVE (INTERVAL HISTORY) He is presently in Echo lab having TEE   OBJECTIVE Temp:  [97.7 F (36.5 C)-98.9 F (37.2 C)] 98 F (36.7 C) (06/28 1224) Pulse Rate:  [53-92] 57 (06/28 1250) Cardiac Rhythm:  [-] Heart block (06/28 0737) Resp:  [14-26] 19 (06/28 1250) BP: (102-182)/(52-115) 102/69 mmHg (06/28 1250) SpO2:  [93 %-100 %] 94 % (06/28 1250)  CBC:   Recent Labs Lab 10/27/15 0235 10/27/15 0242 10/27/15 0831  WBC 5.9  --  4.6  NEUTROABS 2.6  --   --   HGB 13.0 13.6 12.8*  HCT 39.9 40.0 38.4*  MCV 90.1  --  91.0  PLT 229  --  99991111    Basic Metabolic Panel:   Recent Labs Lab 10/27/15 0235 10/27/15 0242 10/27/15 0831  NA 142 143  --   K 4.0 3.9  --   CL 109 106  --   CO2 27  --   --   GLUCOSE 104* 98  --   BUN 25* 29*  --   CREATININE 1.07 1.00 0.89  CALCIUM 9.4  --   --     Lipid Panel:     Component Value Date/Time   CHOL 151 10/27/2015 0831   TRIG 66 10/27/2015 0831   HDL 50 10/27/2015 0831   CHOLHDL 3.0 10/27/2015 0831   VLDL 13 10/27/2015 0831   LDLCALC 88 10/27/2015 0831   HgbA1c:  Lab Results  Component Value Date   HGBA1C 5.6 10/27/2015   Urine Drug Screen: No results found for: LABOPIA, COCAINSCRNUR, LABBENZ, AMPHETMU, THCU, LABBARB    IMAGING  No results found. 2D Echocardiogram  - Left ventricle: The cavity size was normal. There was mild concentric hypertrophy. Systolic function was vigorous. The estimated ejection fraction was in the range of 65% to 70%. Wall motion was normal; there were no regional wall motion abnormalities. Doppler parameters are consistent with abnormal left ventricular relaxation (grade 1 diastolic dysfunction). Doppler parameters are consistent with elevated ventricular end-diastolic filling pressure. - Aortic valve: Trileaflet; normal thickness leaflets. There was mild regurgitation. - Mitral valve: Structurally normal valve. There was mild regurgitation. - Left atrium: The  atrium was mildly dilated. - Right ventricle: Systolic function was normal. - Tricuspid valve: There was mild regurgitation. - Pulmonic valve: There was no regurgitation. - Pulmonary arteries: Systolic pressure was within the normal range. - Inferior vena cava: The vessel was normal in size. Impressions:   No cardiac source of emboli was indentified.   PHYSICAL EXAM Pleasant elderly male currently not in distress. . Afebrile. Head is nontraumatic. Neck is supple without bruit.    Cardiac exam no murmur or gallop. Lungs are clear to auscultation. Distal pulses are well felt. Neurological Exam ;  Awake alert mild expressive aphasia with paraphasic errors and word finding difficulties. Good comprehension and naming and repetition. Follows three-step commands. Pupils equal and reactive. Vision acuity and fields seem adequate. Fundi were not visualized. Extraocular moments are full range without nystagmus. Face is symmetric without weakness. Tongue midline. Motor system exam no upper or lower extremity drift. Symmetric and equal strength in all 4 extremities. No focal weakness. Sensation is intact. Coordination is slow but accurate. Gait was not tested.   ASSESSMENT/PLAN [name] is a 75 y.o. male with history of hyperlipidemia, arthritis and hearing loss presenting with expressive aphasia. He did not receive IV t-PA due to delay in arrival.   Stroke:  Small left frontal lobe  infarct embolic secondary to unknown source  Resultant  Expressive aphaisa  MRI  Small L frontal lobe infarct. small vessel disease   MRA  No emergent large vessel occlusion  Carotid Doppler  pending   2D Echo  EF 65-70%. No source of embolus   TEE   shows no cardiac source of embolism. PFO found. Lower extremity venous Doppler pending  LDL 88  HgbA1c 5.6  Lovenox 40 mg sq daily for VTE prophylaxis Diet Heart Room service appropriate?: Yes; Fluid consistency:: Thin  aspirin 81 mg daily prior to  admission, now on aspirin 325 mg daily. Patient concerned about bruising and thin skin. Discussed, but will not change to plavix at this time.   Patient counseled to be compliant with his antithrombotic medications  Ongoing aggressive stroke risk factor management  Therapy recommendations:  No PT needs, OP ST  Disposition:  Return home with wife  Hyperlipidemia  Home meds:  lipitor 10, resumed in hospital  LDL 88, goal < 70  Continue statin at discharge  Other Stroke Risk Factors  Advanced age  Former Cigarette smoker  Hospital day # [place] for Pager information 10/29/2015 2:04 PM  I have personally examined this patient, reviewed notes, independently viewed imaging studies, participated in medical decision making and plan of care. I have made any additions or clarifications directly to the above note. Agree with note above.  Patient has mild expressive aphasia which is persistent.   TEE shows PFO and lower extremity venous Dopplers are pending.  and loop recorder today if no DVT found . Discussed with wife and answered questions.Greater than 50% time during this 25 minute visit was spent on counseling and coordination of care about stroke risk in stroke prevention and treatment   Antony Contras, MD Medical Director [place] Pager: [phone] 10/29/2015 2:04 PM    To contact Stroke Continuity provider, please refer to [email or web address] After hours, contact General Neurology

## 2015-10-29 NOTE — Interval H&P Note (Signed)
History and Physical Interval Note:  10/29/2015 3:31 PM  Early Chars Patin  has presented today for surgery, with the diagnosis of afib  The various methods of treatment have been discussed with the patient and family. After consideration of risks, benefits and other options for treatment, the patient has consented to  Procedure(s): Loop Recorder Insertion (N/A) as a surgical intervention .  The patient's history has been reviewed, patient examined, no change in status, stable for surgery.  I have reviewed the patient's chart and labs.  Questions were answered to the patient's satisfaction.     Cristopher Peru

## 2015-10-29 NOTE — Discharge Instructions (Signed)
Wound care: Keep the wound clean and dry for 3 days, you may shower on 11/02/15.  Ischemic Stroke Treated Without Warfarin An ischemic stroke (cerebrovascular accident) is the sudden death of brain tissue. It is a medical emergency. An ischemic stroke can cause permanent loss of brain function. This can cause problems with different parts of your body. CAUSES An ischemic stroke is caused by a decrease of oxygen supply to an area of your brain. It is usually the result of a small blood clot (embolus) or collection of cholesterol or fat (plaque) that blocks blood flow in the brain. An ischemic stroke can also be caused by blocked or damaged carotid arteries. RISK FACTORS  High blood pressure (hypertension).  High cholesterol.  Diabetes mellitus.  Heart disease.  The buildup of plaque in the blood vessels (peripheral artery disease or atherosclerosis).  The buildup of plaque in the blood vessels that provide blood and oxygen to the brain (carotid artery stenosis).  An abnormal heart rhythm (atrial fibrillation).  Obesity.  Smoking cigarettes.  Taking oral contraceptives, especially in combination with using tobacco.  Physical inactivity.  A diet that is high in fats, salt (sodium), and calories.  Excessive alcohol use.  Use of illegal drugs, especially cocaine and methamphetamine.  Being African American.  Being over the age of 49 years.  Family history of stroke.  Previous history of blood clots, stroke, TIA (transient ischemic attack), or heart attack.  Sickle cell disease. SIGNS AND SYMPTOMS These symptoms usually develop suddenly, or you may notice them after waking up from sleep. Symptoms may include sudden:  Weakness or numbness in your face, arm, or leg, especially on one side of your body.  Confusion.  Trouble speaking (aphasia) or understanding speech.  Trouble seeing with one or both eyes.  Trouble walking or difficulty moving your arms or  legs.  Dizziness.  Loss of balance or coordination.  Severe headache with no known cause. The headache is often described as the worst headache ever experienced. DIAGNOSIS Your health care provider can often determine the presence or absence of an ischemic stroke based on your symptoms, history, and physical exam. CT (computed tomography) of the brain is usually performed to confirm the stroke, determine causes, and determine stroke severity. Other tests may be done to find the cause of the stroke. These tests may include:  ECG (electrocardiogram).  Continuous heart monitoring.  Echocardiogram.  Carotid ultrasound.  MRI.  A scan of the brain circulation.  Blood tests. TREATMENT It is very important to seek treatment at the first sign of stroke symptoms. Your health care provider may perform the following treatments within 6 hours of the onset of stroke symptoms:  Medicine to dissolve the blood clot (thrombolytic).  Inserting a device into the affected artery to remove the blood clot. These treatments may not be effective if too much time has passed since your stroke symptoms began. Even if you do not know when your symptoms began, get treatment as soon as possible. There are other treatment options that may be given, such as:  Oxygen.  IV fluids.  Medicines to thin the blood (anticoagulants).  A procedure to widen blocked arteries. Your treatment will depend on how long you have had your symptoms, the severity of your symptoms, and the cause of your symptoms. Your health care provider will take measures to prevent short-term and long-term complications of stroke, such as:  Breathing foreign material into the lungs (aspiration pneumonia).  Blood clots in the legs.  Bedsores.  Falls. Medicines and dietary changes may be used to help treat and manage risk factors for stroke, such as diabetes and high blood pressure. If any of your body's functions were impaired by  stroke, you may work with physical, speech, or occupational therapists to help you recover. HOME CARE INSTRUCTIONS  Take medicines only as directed by your health care provider. Follow the directions carefully. Medicines may be used to control risk factors for a stroke. Be sure that you understand all your medicine instructions.  If swallow studies have determined that your swallowing reflex is present, you should eat healthy foods. Foods may need to be a soft or pureed consistency, or you may need to take small bites in order to avoid aspirating or choking.  Follow physical activity guidelines as directed by your health care team.  Do not use any tobacco products, including cigarettes, chewing tobacco, or electronic cigarettes. If you smoke, quit. If you need help quitting, ask your health care provider.  Limit or stop alcohol use.  A safe home environment is important to reduce the risk of falls. Your health care provider may arrange for specialists to evaluate your home. Having grab bars in the bedroom and bathroom is often important. Your health care provider may arrange for equipment to be used at home, such as raised toilets and a seat for the shower.  Ongoing physical, occupational, and speech therapy may be needed to maximize your recovery after a stroke. If you have been advised to use a walker or a cane, use it at all times. Be sure to keep your therapy appointments.  Keep all follow-up visits with your health care provider. This is very important. This includes any referrals, therapy, rehabilitation, and lab tests. Proper follow-up can prevent another stroke from occurring. PREVENTION The risk of a stroke can be decreased by appropriately treating high blood pressure, high cholesterol, diabetes, heart disease, and obesity. It can also be decreased by quitting smoking, limiting alcohol, and staying physically active. SEEK IMMEDIATE MEDICAL CARE IF:  You have sudden weakness or  numbness in your face, arm, or leg, especially on one side of your body.  You have sudden confusion.  You have sudden trouble speaking (aphasia) or understanding.  You have sudden trouble seeing with one or both eyes.  You have sudden trouble walking or difficulty moving your arms or legs.  You have sudden dizziness.  You have a sudden loss of balance or coordination.  You have a sudden, severe headache with no known cause.  You have a partial or total loss of consciousness. Any of these symptoms may represent a serious problem that is an emergency. Do not wait to see if the symptoms will go away. Get medical help right away. Call your local emergency services (911 in [place] Do not drive yourself to the hospital.   This information is not intended to replace advice given to you by your health care provider. Make sure you discuss any questions you have with your health care provider.   Document Released: 02/01/2014 Document Reviewed: 02/01/2014 Elsevier Interactive Patient Education [employer].

## 2015-10-29 NOTE — Interval H&P Note (Signed)
History and Physical Interval Note:  10/29/2015 11:59 AM  Antonio Manning  has presented today for surgery, with the diagnosis of STROKE  The various methods of treatment have been discussed with the patient and family. After consideration of risks, benefits and other options for treatment, the patient has consented to  Procedure(s): TRANSESOPHAGEAL ECHOCARDIOGRAM (TEE) (N/A) as a surgical intervention .  The patient's history has been reviewed, patient examined, no change in status, stable for surgery.  I have reviewed the patient's chart and labs.  Questions were answered to the patient's satisfaction.     [name] [employer]

## 2015-10-29 NOTE — Progress Notes (Signed)
ELECTROPHYSIOLOGY CONSULT NOTE  Patient ID: Antonio Manning MRN: [id number], DOB/AGE: [birth year]   Admit date: 10/27/2015 Date of Consult: 10/29/2015  Primary Physician: Gennette Pac, MD Primary Cardiologist: Dr. Debara Pickett Reason for Consultation: Cryptogenic stroke ; recommendations regarding Implantable Loop Recorder  History of Present Illness Antonio Manning was admitted on 10/27/2015 with acute aphasia.  They first developed symptoms while at home, woke with the inability to speak, denies any weakness or c/o otherwise.  He has PMHx only of HLD, no cardiac history, he saw Dr. Debara Pickett 2015 for vague symptoms of fatigue, had an ETT that was normal.  Imaging demonstratedSmall left frontal lobe infarct embolic secondary to unknown source.  he has undergone workup for stroke including echocardiogram and carotid dopplers.  The patient has been monitored on telemetry which has demonstrated sinus rhythm with a single brief PAT.  Inpatient stroke work-up is to be completed with a TEE.   Echocardiogram this admission demonstrated  Study Conclusions - Left ventricle: The cavity size was normal. There was mild  concentric hypertrophy. Systolic function was vigorous. The  estimated ejection fraction was in the range of 65% to 70%. Wall  motion was normal; there were no regional wall motion  abnormalities. Doppler parameters are consistent with abnormal  left ventricular relaxation (grade 1 diastolic dysfunction).  Doppler parameters are consistent with elevated ventricular  end-diastolic filling pressure. - Aortic valve: Trileaflet; normal thickness leaflets. There was  mild regurgitation. - Mitral valve: Structurally normal valve. There was mild  regurgitation. - Left atrium: The atrium was mildly dilated. - Right ventricle: Systolic function was normal. - Tricuspid valve: There was mild regurgitation. - Pulmonic valve: There was no regurgitation. - Pulmonary arteries: Systolic pressure  was within the normal  range. - Inferior vena cava: The vessel was normal in size. Impressions: - No cardiac source of emboli was indentified.   Lab work is reviewed.  Prior to admission, the patient denies chest pain, shortness of breath, dizziness, palpitations, or syncope.  They are recovering from their stroke with plans to home at discharge.  EP has been asked to evaluate for placement of an implantable loop recorder to monitor for atrial fibrillation.     Past Medical History  Diagnosis Date  . Cholesterol serum elevated June 12, 2011    tx. Lipitor  . Cataract 12-Jun-2011    right  . PONV (postoperative nausea and vomiting) June 12, 2011    with cataract surgery  . Kidney calculi 06/12/11    past hx. x1-passed on own, mild prostate issues-no meds  . Arthritis 06-12-2011    osteoarthritis,lt. hip, and neck area  . Cancer ([place]) 2011/06/12    skin cancer lesions-multiple  . Hearing loss 06-12-2011    wears hearing aids bilaterally  . Expressive aphasia 10/2015  . Stroke [facility]) 10/2015     Surgical History:  Past Surgical History  Procedure Laterality Date  . Back surgery  06-12-2011    Lumbar disc surgery-no hardware  . Cataract extraction w/ intraocular lens implant  12-Jun-2011    left eye  . Total hip arthroplasty  05/20/2011    Procedure: TOTAL HIP ARTHROPLASTY ANTERIOR APPROACH;  Surgeon: Mauri Pole, MD;  Location: [facility] ORS;  Service: Orthopedics;  Laterality: Left;  Marland Kitchen Eye surgery      eye lid lift      Prescriptions prior to admission  Medication Sig Dispense Refill Last Dose  . atorvastatin (LIPITOR) 10 MG tablet Take 10 mg by mouth at bedtime.    10/26/2015  at Unknown time  . Calcium Carbonate-Vitamin D (CALCIUM + D PO) Take 1 tablet by mouth at bedtime.    10/26/2015 at Unknown time  . Glucosamine HCl-Glucosamin SO4 (GLUCOSAMINE COMPLEX PO) Take 1 tablet by mouth at bedtime.    10/26/2015 at Unknown time  . ibuprofen (ADVIL,MOTRIN) 200 MG tablet Take 200-600 mg by mouth every 6 (six)  hours as needed for moderate pain.   Past Month at Unknown time  . Multiple Vitamin (MULITIVITAMIN WITH MINERALS) TABS Take 1 tablet by mouth daily.     10/26/2015 at Unknown time    Inpatient Medications:  .  stroke: mapping our early stages of recovery book   Does not apply Once  . aspirin  300 mg Rectal Daily   Or  . aspirin  325 mg Oral Daily  . atorvastatin  10 mg Oral QHS  . enoxaparin (LOVENOX) injection  40 mg Subcutaneous Q24H    Allergies:  Allergies  Allergen Reactions  . Codeine Nausea And Vomiting    Social History   Social History  . Marital Status: Married    Spouse Name: N/A  . Number of Children: 3  . Years of Education: 12   Occupational History  . lawn service - retired    Social History Main Topics  . Smoking status: Former Smoker -- 25 years    Types: Cigarettes    Quit date: 05/12/1988  . Smokeless tobacco: Never Used  . Alcohol Use: No  . Drug Use: No  . Sexual Activity: Yes   Other Topics Concern  . Not on file   Social History Narrative     Family History  Problem Relation Age of Onset  . Heart Problems Mother   . Cancer Father   . Cancer Brother   . Stroke Brother   . Heart Problems Brother   . Cancer Brother   . Heart Problems Brother   . Heart Problems Sister   . Hyperlipidemia Sister   . Birth defects Child   . Other Child     one kidney      Review of Systems: All other systems reviewed and are otherwise negative except as noted above.  Physical Exam: Filed Vitals:   10/28/15 1711 10/28/15 2158 10/29/15 0125 10/29/15 0546  BP: 144/89 136/71 113/52 151/83  Pulse: 55 61 54 53  Temp: 97.9 F (36.6 C) 98.3 F (36.8 C) 98.2 F (36.8 C) 98.4 F (36.9 C)  TempSrc: Oral Oral Oral Oral  Resp: 20 20 18 16   Height:      Weight:      SpO2: 96% 97% 93% 99%    GEN- The patient is well appearing, alert and oriented x 3 today.   Head- normocephalic, atraumatic Eyes-  Sclera clear, conjunctiva pink Ears- hearing  intact Oropharynx- clear Neck- supple Lungs- Clear to ausculation bilaterally, normal work of breathing Heart- Regular rate and rhythm, no murmurs, rubs or gallops  GI- soft, NT, ND Extremities- no clubbing, cyanosis, or edema MS- no significant deformity or atrophy Skin- no rash or lesion Psych- euthymic mood, full affect   Labs:   Lab Results  Component Value Date   WBC 4.6 10/27/2015   HGB 12.8* 10/27/2015   HCT 38.4* 10/27/2015   MCV 91.0 10/27/2015   PLT 205 10/27/2015    Recent Labs Lab 10/27/15 0235 10/27/15 0242 10/27/15 0831  NA 142 143  --   K 4.0 3.9  --   CL 109 106  --  CO2 27  --   --   BUN 25* 29*  --   CREATININE 1.07 1.00 0.89  CALCIUM 9.4  --   --   PROT 6.4*  --   --   BILITOT 1.2  --   --   ALKPHOS 56  --   --   ALT 16*  --   --   AST 21  --   --   GLUCOSE 104* 98  --    Lab Results  Component Value Date   TROPONINI <0.03 10/27/2015   Lab Results  Component Value Date   CHOL 151 10/27/2015   Lab Results  Component Value Date   HDL 50 10/27/2015   Lab Results  Component Value Date   LDLCALC 88 10/27/2015   Lab Results  Component Value Date   TRIG 66 10/27/2015   Lab Results  Component Value Date   CHOLHDL 3.0 10/27/2015   No results found for: LDLDIRECT  No results found for: DDIMER   Radiology/Studies:  [name] 10/27/2015  CLINICAL DATA:  Expressive aphasia, last seen normal at 9:30 p.m. RIGHT facial droop. History of cancer. EXAM: MRI HEAD WITHOUT CONTRAST MRA HEAD WITHOUT CONTRAST TECHNIQUE: Multiplanar, multiecho pulse sequences of the brain and surrounding structures were obtained without intravenous contrast. Angiographic images of the head were obtained using MRA technique without contrast. COMPARISON:  CT HEAD October 27, 2015 at 0236 hours FINDINGS: MRI HEAD FINDINGS INTRACRANIAL CONTENTS: Patchy reduced diffusion LEFT posterior frontal lobe measuring up to 13 x 10 mm lobe corresponding low ADC value.  No susceptibility artifact to suggest hemorrhage. The ventricles and sulci are normal for patient's age. No suspicious parenchymal signal, masses or mass effect. Patchy supratentorial white matter FLAIR T2 hyperintensities. No abnormal extra-axial fluid collections. No extra-axial masses though, contrast enhanced sequences would be more sensitive. Normal major intracranial vascular flow voids present at skull base. ORBITS: The included ocular globes and orbital contents are non-suspicious. Status post bilateral ocular lens implant. SINUSES: The mastoid air-cells and included paranasal sinuses are well-aerated. SKULL/SOFT TISSUES: No abnormal sellar expansion. No suspicious calvarial bone marrow signal. Craniocervical junction maintained. MRA HEAD FINDINGS (Moderately motion degraded examination) ANTERIOR CIRCULATION: Normal flow related enhancement of the included cervical, petrous, cavernous and supraclinoid internal carotid arteries. Patent anterior communicating artery. Normal flow related enhancement of the anterior and middle cerebral arteries, including distal segments. No large vessel occlusion, high-grade stenosis,  aneurysm. POSTERIOR CIRCULATION: Codominant vertebral arteries. Basilar artery is patent, with normal flow related enhancement of the main branch vessels. Normal flow related enhancement of the posterior cerebral arteries. No large vessel occlusion, high-grade stenosis, aneurysm. IMPRESSION: MRI HEAD: Small acute LEFT frontal lobe nonhemorrhagic infarct. Mild to moderate white matter changes compatible with chronic small vessel ischemic disease. MRA HEAD: Moderately motion degraded examination without emergent large vessel occlusion though, limited assessment of mid to distal vessels. Electronically Signed   By: Elon Alas M.D.   On: 10/27/2015 06:29    Ct Head Code Stroke W/o Cm 10/27/2015  CLINICAL DATA:  Slurred speech. EXAM: CT HEAD WITHOUT CONTRAST TECHNIQUE: Contiguous axial  images were obtained from the base of the skull through the vertex without intravenous contrast. COMPARISON:  11/25/2009 FINDINGS: Negative for acute intracranial hemorrhage, mass or evidence of acute infarction. Multifocal patchy white matter hypodensities are present, likely due to chronic small vessel disease. Mild generalized atrophy. No extra-axial fluid collection. No acute findings are evident. No significant bony abnormality. The visible paranasal sinuses  are clear. IMPRESSION: Chronic white matter hypodensities and mild generalized atrophy. No acute findings are evident. These results were called by telephone at the time of interpretation on 10/27/2015 at 2:47 am to Dr. Wallie Char, who verbally acknowledged these results. Electronically Signed   By: Andreas Newport M.D.   On: 10/27/2015 02:47    12-lead ECG SR, 1st degree AVblock All prior EKG's in EPIC reviewed with no documented atrial fibrillation  Telemetry SB/SR, 1st degree AVblock, occ PACs/PVCs, one brief PAT  Assessment and Plan:  1. Cryptogenic stroke The patient presents with cryptogenic stroke.  The patient has a TEE planned for this AM.  I spoke at length with the patient about monitoring for afib with either a 30 day event monitor or an implantable loop recorder.  Risks, benefits, and alteratives to implantable loop recorder were discussed with the patient today.   At this time, the patient is undecided, and would like to give it some thought, he will discuss further with Dr. Lovena Le.   Wound care was reviewed with the patient (keep incision clean and dry for 3 days).  Wound check scheduled for him should he decide to proceed.  Please call with questions.   Renee Dyane Dustman, PA-C 10/29/2015  EP Attending  Patient seen and examined. Agree with above. The patient has had a cryptogenic stroke and has improved with some residual expressive aphasia. I have discussed the indications for ILR insertion with the patient and  his wife and if no cause of the stroke identified on TEE, will plan to proceed with ILR insertion.   Mikle Bosworth.D.

## 2015-10-29 NOTE — Care Management Note (Signed)
Case Management Note  Patient Details  Name: Antonio Manning MRN: [id number] Date of Birth: 1940/10/02  Subjective/Objective:                    Action/Plan: Patient discharging home with orders for home speech therapy. CM met with the patient and his wife and provided them a list of [place] agencies in the [place] area. They selected [facility]. Information faxed to [facility]. Bedside RN updated. CM will follow up with [facility] in the am.   Expected Discharge Date:                  Expected Discharge Plan:  [name]  In-House Referral:     Discharge planning Services  CM Consult  Post Acute Care Choice:  Home Health Choice offered to:  Patient  DME Arranged:    DME Agency:     HH Arranged:  Speech Therapy [place] Agency:  [place]  Status of Service:  Completed, signed off  If discussed at [place] of Stay Meetings, dates discussed:    Additional Comments:  Pollie Friar, RN 10/29/2015, 5:02 PM

## 2015-10-29 NOTE — H&P (View-Only) (Signed)
ELECTROPHYSIOLOGY CONSULT NOTE  Patient ID: Antonio Manning MRN: [id number], DOB/AGE: 1941/04/23   Admit date: 10/27/2015 Date of Consult: 10/29/2015  Primary Physician: Antonio Pac, MD Primary Cardiologist: Dr. Debara Manning Reason for Consultation: Cryptogenic stroke ; recommendations regarding Implantable Loop Recorder  History of Present Illness Antonio Manning was admitted on 10/27/2015 with acute aphasia.  They first developed symptoms while at home, woke with the inability to speak, denies any weakness or c/o otherwise.  He has PMHx only of HLD, no cardiac history, he saw Dr. Debara Manning 2015 for vague symptoms of fatigue, had an ETT that was normal.  Imaging demonstratedSmall left frontal lobe infarct embolic secondary to unknown source.  he has undergone workup for stroke including echocardiogram and carotid dopplers.  The patient has been monitored on telemetry which has demonstrated sinus rhythm with a single brief PAT.  Inpatient stroke work-up is to be completed with a TEE.   Echocardiogram this admission demonstrated  Study Conclusions - Left ventricle: The cavity size was normal. There was mild  concentric hypertrophy. Systolic function was vigorous. The  estimated ejection fraction was in the range of 65% to 70%. Wall  motion was normal; there were no regional wall motion  abnormalities. Doppler parameters are consistent with abnormal  left ventricular relaxation (grade 1 diastolic dysfunction).  Doppler parameters are consistent with elevated ventricular  end-diastolic filling pressure. - Aortic valve: Trileaflet; normal thickness leaflets. There was  mild regurgitation. - Mitral valve: Structurally normal valve. There was mild  regurgitation. - Left atrium: The atrium was mildly dilated. - Right ventricle: Systolic function was normal. - Tricuspid valve: There was mild regurgitation. - Pulmonic valve: There was no regurgitation. - Pulmonary arteries: Systolic pressure  was within the normal  range. - Inferior vena cava: The vessel was normal in size. Impressions: - No cardiac source of emboli was indentified.   Lab work is reviewed.  Prior to admission, the patient denies chest pain, shortness of breath, dizziness, palpitations, or syncope.  They are recovering from their stroke with plans to home at discharge.  EP has been asked to evaluate for placement of an implantable loop recorder to monitor for atrial fibrillation.     Past Medical History  Diagnosis Date  . Cholesterol serum elevated 2011-05-30    tx. Lipitor  . Cataract May 30, 2011    right  . PONV (postoperative nausea and vomiting) May 30, 2011    with cataract surgery  . Kidney calculi 05-30-2011    past hx. x1-passed on own, mild prostate issues-no meds  . Arthritis May 30, 2011    osteoarthritis,lt. hip, and neck area  . Cancer ([place]) 05/30/2011    skin cancer lesions-multiple  . Hearing loss 05-30-2011    wears hearing aids bilaterally  . Expressive aphasia 10/2015  . Stroke [facility]) 10/2015     Surgical History:  Past Surgical History  Procedure Laterality Date  . Back surgery  May 30, 2011    Lumbar disc surgery-no hardware  . Cataract extraction w/ intraocular lens implant  2011/05/30    left eye  . Total hip arthroplasty  05/20/2011    Procedure: TOTAL HIP ARTHROPLASTY ANTERIOR APPROACH;  Surgeon: Antonio Pole, MD;  Location: [facility];  Service: Orthopedics;  Laterality: Left;  Marland Kitchen Eye surgery      eye lid lift      Prescriptions prior to admission  Medication Sig Dispense Refill Last Dose  . atorvastatin (LIPITOR) 10 MG tablet Take 10 mg by mouth at bedtime.    10/26/2015  at Unknown time  . Calcium Carbonate-Vitamin D (CALCIUM + D PO) Take 1 tablet by mouth at bedtime.    10/26/2015 at Unknown time  . Glucosamine HCl-Glucosamin SO4 (GLUCOSAMINE COMPLEX PO) Take 1 tablet by mouth at bedtime.    10/26/2015 at Unknown time  . ibuprofen (ADVIL,MOTRIN) 200 MG tablet Take 200-600 mg by mouth every 6 (six)  hours as needed for moderate pain.   Past Month at Unknown time  . Multiple Vitamin (MULITIVITAMIN WITH MINERALS) TABS Take 1 tablet by mouth daily.     10/26/2015 at Unknown time    Inpatient Medications:  .  stroke: mapping our early stages of recovery book   Does not apply Once  . aspirin  300 mg Rectal Daily   Or  . aspirin  325 mg Oral Daily  . atorvastatin  10 mg Oral QHS  . enoxaparin (LOVENOX) injection  40 mg Subcutaneous Q24H    Allergies:  Allergies  Allergen Reactions  . Codeine Nausea And Vomiting    Social History   Social History  . Marital Status: Married    Spouse Name: N/A  . Number of Children: 3  . Years of Education: 12   Occupational History  . lawn service - retired    Social History Main Topics  . Smoking status: Former Smoker -- 25 years    Types: Cigarettes    Quit date: 05/12/1988  . Smokeless tobacco: Never Used  . Alcohol Use: No  . Drug Use: No  . Sexual Activity: Yes   Other Topics Concern  . Not on file   Social History Narrative     Family History  Problem Relation Age of Onset  . Heart Problems Mother   . Cancer Father   . Cancer Brother   . Stroke Brother   . Heart Problems Brother   . Cancer Brother   . Heart Problems Brother   . Heart Problems Sister   . Hyperlipidemia Sister   . Birth defects Child   . Other Child     one kidney      Review of Systems: All other systems reviewed and are otherwise negative except as noted above.  Physical Exam: Filed Vitals:   10/28/15 1711 10/28/15 2158 10/29/15 0125 10/29/15 0546  BP: 144/89 136/71 113/52 151/83  Pulse: 55 61 54 53  Temp: 97.9 F (36.6 C) 98.3 F (36.8 C) 98.2 F (36.8 C) 98.4 F (36.9 C)  TempSrc: Oral Oral Oral Oral  Resp: 20 20 18 16   Height:      Weight:      SpO2: 96% 97% 93% 99%    GEN- The patient is well appearing, alert and oriented x 3 today.   Head- normocephalic, atraumatic Eyes-  Sclera clear, conjunctiva pink Ears- hearing  intact Oropharynx- clear Neck- supple Lungs- Clear to ausculation bilaterally, normal work of breathing Heart- Regular rate and rhythm, no murmurs, rubs or gallops  GI- soft, NT, ND Extremities- no clubbing, cyanosis, or edema MS- no significant deformity or atrophy Skin- no rash or lesion Psych- euthymic mood, full affect   Labs:   Lab Results  Component Value Date   WBC 4.6 10/27/2015   HGB 12.8* 10/27/2015   HCT 38.4* 10/27/2015   MCV 91.0 10/27/2015   PLT 205 10/27/2015    Recent Labs Lab 10/27/15 0235 10/27/15 0242 10/27/15 0831  NA 142 143  --   K 4.0 3.9  --   CL 109 106  --  CO2 27  --   --   BUN 25* 29*  --   CREATININE 1.07 1.00 0.89  CALCIUM 9.4  --   --   PROT 6.4*  --   --   BILITOT 1.2  --   --   ALKPHOS 56  --   --   ALT 16*  --   --   AST 21  --   --   GLUCOSE 104* 98  --    Lab Results  Component Value Date   TROPONINI <0.03 10/27/2015   Lab Results  Component Value Date   CHOL 151 10/27/2015   Lab Results  Component Value Date   HDL 50 10/27/2015   Lab Results  Component Value Date   LDLCALC 88 10/27/2015   Lab Results  Component Value Date   TRIG 66 10/27/2015   Lab Results  Component Value Date   CHOLHDL 3.0 10/27/2015   No results found for: LDLDIRECT  No results found for: DDIMER   Radiology/Studies:  [name] 10/27/2015  CLINICAL DATA:  Expressive aphasia, last seen normal at 9:30 p.m. RIGHT facial droop. History of cancer. EXAM: MRI HEAD WITHOUT CONTRAST MRA HEAD WITHOUT CONTRAST TECHNIQUE: Multiplanar, multiecho pulse sequences of the brain and surrounding structures were obtained without intravenous contrast. Angiographic images of the head were obtained using MRA technique without contrast. COMPARISON:  CT HEAD October 27, 2015 at 0236 hours FINDINGS: MRI HEAD FINDINGS INTRACRANIAL CONTENTS: Patchy reduced diffusion LEFT posterior frontal lobe measuring up to 13 x 10 mm lobe corresponding low ADC value.  No susceptibility artifact to suggest hemorrhage. The ventricles and sulci are normal for patient's age. No suspicious parenchymal signal, masses or mass effect. Patchy supratentorial white matter FLAIR T2 hyperintensities. No abnormal extra-axial fluid collections. No extra-axial masses though, contrast enhanced sequences would be more sensitive. Normal major intracranial vascular flow voids present at skull base. ORBITS: The included ocular globes and orbital contents are non-suspicious. Status post bilateral ocular lens implant. SINUSES: The mastoid air-cells and included paranasal sinuses are well-aerated. SKULL/SOFT TISSUES: No abnormal sellar expansion. No suspicious calvarial bone marrow signal. Craniocervical junction maintained. MRA HEAD FINDINGS (Moderately motion degraded examination) ANTERIOR CIRCULATION: Normal flow related enhancement of the included cervical, petrous, cavernous and supraclinoid internal carotid arteries. Patent anterior communicating artery. Normal flow related enhancement of the anterior and middle cerebral arteries, including distal segments. No large vessel occlusion, high-grade stenosis,  aneurysm. POSTERIOR CIRCULATION: Codominant vertebral arteries. Basilar artery is patent, with normal flow related enhancement of the main branch vessels. Normal flow related enhancement of the posterior cerebral arteries. No large vessel occlusion, high-grade stenosis, aneurysm. IMPRESSION: MRI HEAD: Small acute LEFT frontal lobe nonhemorrhagic infarct. Mild to moderate white matter changes compatible with chronic small vessel ischemic disease. MRA HEAD: Moderately motion degraded examination without emergent large vessel occlusion though, limited assessment of mid to distal vessels. Electronically Signed   By: Elon Alas M.D.   On: 10/27/2015 06:29    Ct Head Code Stroke W/o Cm 10/27/2015  CLINICAL DATA:  Slurred speech. EXAM: CT HEAD WITHOUT CONTRAST TECHNIQUE: Contiguous axial  images were obtained from the base of the skull through the vertex without intravenous contrast. COMPARISON:  11/25/2009 FINDINGS: Negative for acute intracranial hemorrhage, mass or evidence of acute infarction. Multifocal patchy white matter hypodensities are present, likely due to chronic small vessel disease. Mild generalized atrophy. No extra-axial fluid collection. No acute findings are evident. No significant bony abnormality. The visible paranasal sinuses  are clear. IMPRESSION: Chronic white matter hypodensities and mild generalized atrophy. No acute findings are evident. These results were called by telephone at the time of interpretation on 10/27/2015 at 2:47 am to Dr. Wallie Char, who verbally acknowledged these results. Electronically Signed   By: Andreas Newport M.D.   On: 10/27/2015 02:47    12-lead ECG SR, 1st degree AVblock All prior EKG's in EPIC reviewed with no documented atrial fibrillation  Telemetry SB/SR, 1st degree AVblock, occ PACs/PVCs, one brief PAT  Assessment and Plan:  1. Cryptogenic stroke The patient presents with cryptogenic stroke.  The patient has a TEE planned for this AM.  I spoke at length with the patient about monitoring for afib with either a 30 day event monitor or an implantable loop recorder.  Risks, benefits, and alteratives to implantable loop recorder were discussed with the patient today.   At this time, the patient is undecided, and would like to give it some thought, he will discuss further with Dr. Lovena Le.   Wound care was reviewed with the patient (keep incision clean and dry for 3 days).  Wound check scheduled for him should he decide to proceed.  Please call with questions.   Renee Dyane Dustman, PA-C 10/29/2015  EP Attending  Patient seen and examined. Agree with above. The patient has had a cryptogenic stroke and has improved with some residual expressive aphasia. I have discussed the indications for ILR insertion with the patient and  his wife and if no cause of the stroke identified on TEE, will plan to proceed with ILR insertion.   Mikle Bosworth.D.

## 2015-10-29 NOTE — Progress Notes (Signed)
    [facility] has been requested to perform a transesophageal echocardiogram on Early Chars Berkowitz for evaluation of cryptogenic stroke.  After careful review of history and examination, the risks and benefits of transesophageal echocardiogram have been explained including risks of esophageal damage, perforation (1:10,000 risk), bleeding, pharyngeal hematoma as well as other potential complications associated with conscious sedation including aspiration, arrhythmia, respiratory failure and death. Alternatives to treatment were discussed, questions were answered. Patient is willing to proceed.   Baldwin Jamaica,  10/29/2015 10:54 AM

## 2015-10-29 NOTE — CV Procedure (Signed)
Procedure: TEE  Sedation:Versed 4 mg IV, Fentanyl 37.5 mcg IV  Indication: CVA  Findings: Please see echo section for full report.  Normal LV size with mild LV hypertrophy.  EF 55%.  Normal wall motion.  Normal RV size and systolic function.  No significant TR.  Mild MR.  Trileaflet aortic valve with no stenosis and trivial regurgitation.  Normal left atrial size with no LA appendage thrombus.  Normal right atrial size. PFO was noted by bubble study.  The aortic root was dilated to 5.0 cm and the ascending aorta measured up to 4.6 cm.   Impression:  1. PFO 2. Aneurysmal aortic root/ascending aorta.   Antonio Manning 10/29/2015 12:15 PM

## 2015-10-29 NOTE — Discharge Summary (Signed)
Physician Discharge Summary  Antonio Manning [id number] DOB: Jun 09, 1940 DOA: 10/27/2015  PCP: Gennette Pac, MD  Admit date: 10/27/2015 Discharge date: 10/29/2015  Admitted From: home Disposition:  home  Recommendations for Outpatient Follow-up:  1. Follow up with PCP in 1-2 weeks 2. Please obtain BMP/CBC in one week  Home Health: yes, SLP Equipment/Devices: no  Discharge Condition:  stable  CODE STATUS:  Full code Diet recommendation: Heart Healthy  Brief/Interim Summary: 75 y.o. male with a history of hyperlipidemia, arthritis and hearing loss, brought to the emergency room and code stroke status following acute onset of expressive aphasia. Patient was last known well at 9:30 PM 10/26/2015.  He woke up at 2 AM and was unable to express himself verbally. He can understand well what was being said to him. Right facial droop was noted by his wife. No weakness of extremities was noted. CT scan of his head showed no acute intracranial abnormality. Patient has been taking aspirin daily  Discharge Diagnoses:  Principal Problem:   Cerebral thrombosis with cerebral infarction Active Problems:   Hyperlipidemia   Expressive aphasia   Aphasia   Acute CVA (cerebrovascular accident) ([place])  Small acute LEFT frontal lobe nonhemorrhagic infarct which has resulted in expressive aphasia.  Neurology was consulted and felt location suggested embolic etiology.  Telemetry demonstrated no evidence of atrial fibrillation   - aspirin was increased from 81mg  to 325mg  daily -Lipids LDL 88, triglycerides 66, continued current statin but PCP could consider increasing slightly if patient tolerates - hemoglobin A1c 5.6 -Carotid doppler without evidence of flow limiting stenosis - MRA without significant extracranial stenosis -Echocardiogram EF [id number], grade 1 diastolic dysfunction.   - Transesophageal ECHO:  PFO present -  Duplex lower extremity negative for PE -  Loop recorder placed, follow up for  wound check scheduled  -  Home health SLP ordered  Hyperlipidemia:  Age > 29 yo.  LDL 88.  Recommended healthy heart diet and ongoing exercise  Chronic diastolic heart failure without any signs of exacerbation   Discharge Instructions  Discharge Instructions    (HEART FAILURE PATIENTS) Call MD:  Anytime you have any of the following symptoms: 1) 3 pound weight gain in 24 hours or 5 pounds in 1 week 2) shortness of breath, with or without a dry hacking cough 3) swelling in the hands, feet or stomach 4) if you have to sleep on extra pillows at night in order to breathe.    Complete by:  As directed      Call MD for:  difficulty breathing, headache or visual disturbances    Complete by:  As directed      Call MD for:  extreme fatigue    Complete by:  As directed      Call MD for:  hives    Complete by:  As directed      Call MD for:  persistant dizziness or light-headedness    Complete by:  As directed      Call MD for:  persistant nausea and vomiting    Complete by:  As directed      Call MD for:  severe uncontrolled pain    Complete by:  As directed      Call MD for:  temperature >100.4    Complete by:  As directed      Diet - low sodium heart healthy    Complete by:  As directed      Increase activity slowly    Complete by:  As directed             Medication List    TAKE these medications        aspirin 325 MG tablet  Take 1 tablet (325 mg total) by mouth daily.     atorvastatin 10 MG tablet  Commonly known as:  LIPITOR  Take 10 mg by mouth at bedtime.     CALCIUM + D PO  Take 1 tablet by mouth at bedtime.     GLUCOSAMINE COMPLEX PO  Take 1 tablet by mouth at bedtime.     ibuprofen 200 MG tablet  Commonly known as:  ADVIL,MOTRIN  Take 200-600 mg by mouth every 6 (six) hours as needed for moderate pain.     multivitamin with minerals Tabs tablet  Take 1 tablet by mouth daily.           Follow-up Information    Follow [facility] with [facility] On 11/10/2015.   Specialty:  Cardiology   Why:  3:00PM, wound check   Contact information:   [address] [place] [phone]      Follow up with SETHI,PRAMOD, MD. Schedule an appointment as soon as possible for a visit in 1 month.   Specialties:  Neurology, Radiology   Contact information:   [address] [place] [address] [phone]       Follow up with Cristopher Peru, MD.   Specialty:  Cardiology   Why:  loop recorder   Contact information:   [address] [place] [address] [phone]      Allergies  Allergen Reactions  . Codeine Nausea And Vomiting    Consultations:  Neurology, Dr. Leonie Man   Procedures/Studies: [name]  10/27/2015  CLINICAL DATA:  Expressive aphasia, last seen normal at 9:30 p.m. RIGHT facial droop. History of cancer. EXAM: MRI HEAD WITHOUT CONTRAST MRA HEAD WITHOUT CONTRAST TECHNIQUE: Multiplanar, multiecho pulse sequences of the brain and surrounding structures were obtained without intravenous contrast. Angiographic images of the head were obtained using MRA technique without contrast. COMPARISON:  CT HEAD October 27, 2015 at 0236 hours FINDINGS: MRI HEAD FINDINGS INTRACRANIAL CONTENTS: Patchy reduced diffusion LEFT posterior frontal lobe measuring up to 13 x 10 mm lobe corresponding low ADC value. No susceptibility artifact to suggest hemorrhage. The ventricles and sulci are normal for patient's age. No suspicious parenchymal signal, masses or mass effect. Patchy supratentorial white matter FLAIR T2 hyperintensities. No abnormal extra-axial fluid collections. No extra-axial masses though, contrast enhanced sequences would be more sensitive. Normal major intracranial vascular flow voids present at skull base. ORBITS: The included ocular globes and orbital contents are non-suspicious. Status post bilateral ocular lens implant. SINUSES: The mastoid  air-cells and included paranasal sinuses are well-aerated. SKULL/SOFT TISSUES: No abnormal sellar expansion. No suspicious calvarial bone marrow signal. Craniocervical junction maintained. MRA HEAD FINDINGS (Moderately motion degraded examination) ANTERIOR CIRCULATION: Normal flow related enhancement of the included cervical, petrous, cavernous and supraclinoid internal carotid arteries. Patent anterior communicating artery. Normal flow related enhancement of the anterior and middle cerebral arteries, including distal segments. No large vessel occlusion, high-grade stenosis,  aneurysm. POSTERIOR CIRCULATION: Codominant vertebral arteries. Basilar artery is patent, with normal flow related enhancement of the main branch vessels. Normal flow related enhancement of the posterior cerebral arteries. No large vessel occlusion, high-grade stenosis, aneurysm. IMPRESSION: MRI HEAD: Small acute LEFT frontal lobe nonhemorrhagic infarct. Mild to moderate white matter  changes compatible with chronic small vessel ischemic disease. MRA HEAD: Moderately motion degraded examination without emergent large vessel occlusion though, limited assessment of mid to distal vessels. Electronically Signed   By: Elon Alas M.D.   On: 10/27/2015 06:29   [name]  10/27/2015  CLINICAL DATA:  Expressive aphasia, last seen normal at 9:30 p.m. RIGHT facial droop. History of cancer. EXAM: MRI HEAD WITHOUT CONTRAST MRA HEAD WITHOUT CONTRAST TECHNIQUE: Multiplanar, multiecho pulse sequences of the brain and surrounding structures were obtained without intravenous contrast. Angiographic images of the head were obtained using MRA technique without contrast. COMPARISON:  CT HEAD October 27, 2015 at 0236 hours FINDINGS: MRI HEAD FINDINGS INTRACRANIAL CONTENTS: Patchy reduced diffusion LEFT posterior frontal lobe measuring up to 13 x 10 mm lobe corresponding low ADC value. No susceptibility artifact to suggest hemorrhage. The ventricles  and sulci are normal for patient's age. No suspicious parenchymal signal, masses or mass effect. Patchy supratentorial white matter FLAIR T2 hyperintensities. No abnormal extra-axial fluid collections. No extra-axial masses though, contrast enhanced sequences would be more sensitive. Normal major intracranial vascular flow voids present at skull base. ORBITS: The included ocular globes and orbital contents are non-suspicious. Status post bilateral ocular lens implant. SINUSES: The mastoid air-cells and included paranasal sinuses are well-aerated. SKULL/SOFT TISSUES: No abnormal sellar expansion. No suspicious calvarial bone marrow signal. Craniocervical junction maintained. MRA HEAD FINDINGS (Moderately motion degraded examination) ANTERIOR CIRCULATION: Normal flow related enhancement of the included cervical, petrous, cavernous and supraclinoid internal carotid arteries. Patent anterior communicating artery. Normal flow related enhancement of the anterior and middle cerebral arteries, including distal segments. No large vessel occlusion, high-grade stenosis,  aneurysm. POSTERIOR CIRCULATION: Codominant vertebral arteries. Basilar artery is patent, with normal flow related enhancement of the main branch vessels. Normal flow related enhancement of the posterior cerebral arteries. No large vessel occlusion, high-grade stenosis, aneurysm. IMPRESSION: MRI HEAD: Small acute LEFT frontal lobe nonhemorrhagic infarct. Mild to moderate white matter changes compatible with chronic small vessel ischemic disease. MRA HEAD: Moderately motion degraded examination without emergent large vessel occlusion though, limited assessment of mid to distal vessels. Electronically Signed   By: Elon Alas M.D.   On: 10/27/2015 06:29   Ct Head Code Stroke W/o Cm  10/27/2015  CLINICAL DATA:  Slurred speech. EXAM: CT HEAD WITHOUT CONTRAST TECHNIQUE: Contiguous axial images were obtained from the base of the skull through the vertex  without intravenous contrast. COMPARISON:  11/25/2009 FINDINGS: Negative for acute intracranial hemorrhage, mass or evidence of acute infarction. Multifocal patchy white matter hypodensities are present, likely due to chronic small vessel disease. Mild generalized atrophy. No extra-axial fluid collection. No acute findings are evident. No significant bony abnormality. The visible paranasal sinuses are clear. IMPRESSION: Chronic white matter hypodensities and mild generalized atrophy. No acute findings are evident. These results were called by telephone at the time of interpretation on 10/27/2015 at 2:47 am to Dr. Wallie Char, who verbally acknowledged these results. Electronically Signed   By: Andreas Newport M.D.   On: 10/27/2015 02:47    Carotid duplex:  No evidence of a significant stenosis in bilateral carotid arteries. Vertebral arteries demonstrate antegrade flow.  ECHO:  Left ventricle: The cavity size was normal. There was mild  concentric hypertrophy. Systolic function was vigorous. The  estimated ejection fraction was in the range of 65% to 70%. Wall  motion was normal; there were no regional wall motion  abnormalities. Doppler parameters are consistent with abnormal  left ventricular relaxation (grade  1 diastolic dysfunction).  Doppler parameters are consistent with elevated ventricular  end-diastolic filling pressure. - Aortic valve: Trileaflet; normal thickness leaflets. There was  mild regurgitation. - Mitral valve: Structurally normal valve. There was mild  regurgitation. - Left atrium: The atrium was mildly dilated. - Right ventricle: Systolic function was normal. - Tricuspid valve: There was mild regurgitation. - Pulmonic valve: There was no regurgitation. - Pulmonary arteries: Systolic pressure was within the normal  range. - Inferior vena cava: The vessel was normal in size.    Subjective:   Discharge Exam: Filed Vitals:   10/29/15 1240 10/29/15 1250   BP: 105/66 102/69  Pulse: 56 57  Temp:    Resp: 14 19   Filed Vitals:   10/29/15 1224 10/29/15 1230 10/29/15 1240 10/29/15 1250  BP: 112/59 104/74 105/66 102/69  Pulse: 62 59 56 57  Temp: 98 F (36.7 C)     TempSrc: Oral     Resp: 19 18 14 19   Height:      Weight:      SpO2: 95% 95% 94% 94%    General: Pt is alert, awake, not in acute distress Cardiovascular: RRR, S1/S2 +, no rubs, no gallops Respiratory: CTA bilaterally, no wheezing, no rhonchi Abdominal: Soft, NT, ND, bowel sounds + Extremities: no edema, no cyanosis Neuro:  Difficulty articulating "S" sound and some vowels.  Able to count to ten and do simple calcultations.  Stated months of year and could repeat phrases.  Word formation seems to be most difficult, but comprehension appears intact.  Strength 5/5 throughout, sensation intact to light touch, no dysmetria.  No facial droop.      The results of significant diagnostics from this hospitalization (including imaging, microbiology, ancillary and laboratory) are listed below for reference.     Microbiology: No results found for this or any previous visit (from the past 240 hour(s)).   Labs: BNP (last 3 results) No results for input(s): BNP in the last 8760 hours. Basic Metabolic Panel:  Recent Labs Lab 10/27/15 0235 10/27/15 0242 10/27/15 0831  NA 142 143  --   K 4.0 3.9  --   CL 109 106  --   CO2 27  --   --   GLUCOSE 104* 98  --   BUN 25* 29*  --   CREATININE 1.07 1.00 0.89  CALCIUM 9.4  --   --    Liver Function Tests:  Recent Labs Lab 10/27/15 0235  AST 21  ALT 16*  ALKPHOS 56  BILITOT 1.2  PROT 6.4*  ALBUMIN 4.0   No results for input(s): LIPASE, AMYLASE in the last 168 hours. No results for input(s): AMMONIA in the last 168 hours. CBC:  Recent Labs Lab 10/27/15 0235 10/27/15 0242 10/27/15 0831  WBC 5.9  --  4.6  NEUTROABS 2.6  --   --   HGB 13.0 13.6 12.8*  HCT 39.9 40.0 38.4*  MCV 90.1  --  91.0  PLT 229  --  205    Cardiac Enzymes:  Recent Labs Lab 10/27/15 1000  TROPONINI <0.03   BNP: Invalid input(s): POCBNP CBG:  Recent Labs Lab 10/27/15 2150 10/28/15 0627 10/28/15 1109 10/28/15 1629 10/28/15 2204  GLUCAP 109* 103* 91 89 124*   D-Dimer No results for input(s): DDIMER in the last 72 hours. Hgb A1c  Recent Labs  10/27/15 0831  HGBA1C 5.6   Lipid Profile  Recent Labs  10/27/15 0831  CHOL 151  HDL 50  LDLCALC 88  TRIG 66  CHOLHDL 3.0   Thyroid function studies No results for input(s): TSH, T4TOTAL, T3FREE, THYROIDAB in the last 72 hours.  Invalid input(s): FREET3 Anemia work up No results for input(s): VITAMINB12, FOLATE, FERRITIN, TIBC, IRON, RETICCTPCT in the last 72 hours. Urinalysis    Component Value Date/Time   COLORURINE YELLOW 05/13/2011 0930   APPEARANCEUR CLEAR 05/13/2011 0930   LABSPEC 1.021 05/13/2011 0930   PHURINE 5.0 05/13/2011 0930   GLUCOSEU NEGATIVE 05/13/2011 0930   HGBUR NEGATIVE 05/13/2011 0930   BILIRUBINUR NEGATIVE 05/13/2011 0930   KETONESUR NEGATIVE 05/13/2011 0930   PROTEINUR NEGATIVE 05/13/2011 0930   UROBILINOGEN 0.2 05/13/2011 0930   NITRITE NEGATIVE 05/13/2011 0930   LEUKOCYTESUR NEGATIVE 05/13/2011 0930   Sepsis Labs Invalid input(s): PROCALCITONIN,  WBC,  LACTICIDVEN Microbiology No results found for this or any previous visit (from the past 240 hour(s)).   Time coordinating discharge: Over 30 minutes  SIGNED:   Janece Canterbury, MD  [facility] 10/29/2015, 4:43 PM Pager   If 7PM-7AM, please contact night-coverage [email or web address] Password [email or web address]

## 2015-10-29 NOTE — Progress Notes (Signed)
RN discussed discharge instructions with patient's family. Patient and his family vocalized understanding of discharge instructions including f/u appointments listed on AVS, follow up with pcp. Patient and family aware he cannot drive or go to work until cleared by PCP or neurologist, per Dr. Sheran Fava. Discharge instructions given to patient. Patient aware his rx are at pharmacy. Neuro assessment unchanged, IV taken out, tele removed. Patient refuses wheelchair, will be escorted by nurse tech to car.

## 2015-10-30 ENCOUNTER — Telehealth: Payer: Self-pay | Admitting: Internal Medicine

## 2015-10-30 ENCOUNTER — Telehealth: Payer: Self-pay | Admitting: Neurology

## 2015-10-30 ENCOUNTER — Encounter (HOSPITAL_COMMUNITY): Payer: Self-pay | Admitting: Internal Medicine

## 2015-10-30 NOTE — Telephone Encounter (Signed)
Pt's wife called and scheduled f/u appt for 8/18. She wanted to know is there a window of time that he could have another stroke since a clot was not found.  sts she is "afraid to let him out of her sight, afraid he will have another stroke". Please call. Thank you

## 2015-10-30 NOTE — Telephone Encounter (Signed)
New Message:   Pt had a loop recorder put in on yesterday.  He was told he could take a shower on Sunday.Can he tae the bandage off at this time? His bandage is soaked with blood,can he get it changed by his primary doctor or does someone there need to do it? Please call to advise.

## 2015-10-30 NOTE — Telephone Encounter (Signed)
Mr. Student had a hip replacement by Dr. Alvan Dame and was advised that any time he has a surgical procedure or dental work that he should get prophylactic antibiotics.  He had a loop recorder placed on the date of admission and she wanted to verify that he had received antibiotics.  He had not.  Case discussed with Dr. Linus Salmons who reminded that prophylactic antibiotics are no longer recommended after hip replacements.  This information was relayed to patient's wife who was understanding and grateful.   No antibiotics were prescribed.

## 2015-10-31 DIAGNOSIS — Z96642 Presence of left artificial hip joint: Secondary | ICD-10-CM | POA: Diagnosis not present

## 2015-10-31 DIAGNOSIS — Z87891 Personal history of nicotine dependence: Secondary | ICD-10-CM | POA: Diagnosis not present

## 2015-10-31 DIAGNOSIS — E785 Hyperlipidemia, unspecified: Secondary | ICD-10-CM | POA: Diagnosis not present

## 2015-10-31 DIAGNOSIS — I69320 Aphasia following cerebral infarction: Secondary | ICD-10-CM | POA: Diagnosis not present

## 2015-10-31 DIAGNOSIS — I700 Atherosclerosis of aorta: Secondary | ICD-10-CM | POA: Diagnosis not present

## 2015-10-31 NOTE — Telephone Encounter (Signed)
I spoke to the patient's wife and answered her questions about stroke risk and recurrence she voiced understanding

## 2015-11-01 DIAGNOSIS — Z4801 Encounter for change or removal of surgical wound dressing: Secondary | ICD-10-CM | POA: Diagnosis not present

## 2015-11-01 DIAGNOSIS — S2190XS Unspecified open wound of unspecified part of thorax, sequela: Secondary | ICD-10-CM | POA: Diagnosis not present

## 2015-11-05 DIAGNOSIS — I700 Atherosclerosis of aorta: Secondary | ICD-10-CM | POA: Diagnosis not present

## 2015-11-05 DIAGNOSIS — Z96642 Presence of left artificial hip joint: Secondary | ICD-10-CM | POA: Diagnosis not present

## 2015-11-05 DIAGNOSIS — Z87891 Personal history of nicotine dependence: Secondary | ICD-10-CM | POA: Diagnosis not present

## 2015-11-05 DIAGNOSIS — E785 Hyperlipidemia, unspecified: Secondary | ICD-10-CM | POA: Diagnosis not present

## 2015-11-05 DIAGNOSIS — I69320 Aphasia following cerebral infarction: Secondary | ICD-10-CM | POA: Diagnosis not present

## 2015-11-06 DIAGNOSIS — I499 Cardiac arrhythmia, unspecified: Secondary | ICD-10-CM | POA: Diagnosis not present

## 2015-11-06 DIAGNOSIS — E785 Hyperlipidemia, unspecified: Secondary | ICD-10-CM | POA: Diagnosis not present

## 2015-11-06 DIAGNOSIS — Z8673 Personal history of transient ischemic attack (TIA), and cerebral infarction without residual deficits: Secondary | ICD-10-CM | POA: Diagnosis not present

## 2015-11-10 ENCOUNTER — Other Ambulatory Visit: Payer: Self-pay

## 2015-11-10 ENCOUNTER — Ambulatory Visit: Payer: Medicare Other

## 2015-11-10 DIAGNOSIS — Z87891 Personal history of nicotine dependence: Secondary | ICD-10-CM | POA: Diagnosis not present

## 2015-11-10 DIAGNOSIS — I69320 Aphasia following cerebral infarction: Secondary | ICD-10-CM | POA: Diagnosis not present

## 2015-11-10 DIAGNOSIS — I700 Atherosclerosis of aorta: Secondary | ICD-10-CM | POA: Diagnosis not present

## 2015-11-10 DIAGNOSIS — E785 Hyperlipidemia, unspecified: Secondary | ICD-10-CM | POA: Diagnosis not present

## 2015-11-10 DIAGNOSIS — Z96642 Presence of left artificial hip joint: Secondary | ICD-10-CM | POA: Diagnosis not present

## 2015-11-10 NOTE — Patient Outreach (Signed)
[facility]) Care Management  11/10/2015  Valjean Or Lukens [birth year] [id number]  [facility] Stroke [facility] Referral Date:  11/07/15 Issue:  Red Alert:  Feeling worse all over? Yes  Subjective: Patient and wife state [facility] Program questions on automation is not working correctly and feeling just fine.  Patient completed follow-up appointment with Dr. Rex Kras on 11/06/2015.  MD increased Lipitor from 10 mg to 20 mg daily.  Aspirin increased from 81 mg to 325 mg daily.  Patient released to start driving again.   [place] with [facility] to end today 11/10/15 due to patient is not home bound status.  States ST will discuss OP ST options if patient still needs therapy.     Providers: Primary MD:  Dr. Hulan Fess / [facility]  -  last appt: 11/06/15    Cardiologist:  Dr. Lovena Le - next appt 11/11/15 Neurologist:  Dr. Leonie Man - next appt 12/19/15  HH: ST ([place])   Insurance:  Medicare and [employer].   Social: Patient lives in his home with his wife, [name].   Mobility: Active and requires no assistive devices. Falls: none Pain: none  Depression: none  Transportation: wife and patient.  Caregiver: wife Advance Directive: none  Consent:  [facility]  DME: blood pressure cuff, hearing aides  [facility] Stroke Program  Admissions:  (1) 10/27/2015 - 10/29/2015  Cerebral thrombosis with cerebral infarction, Hyperlipidemia, Expressive aphasia, Aphasia, Acute CVA (cerebrovascular accident) ER visits: (0)   Medications:  Patient taking 6 medications Co-pay cost issues: none  Medication Reconciliation completed 11/10/15  Objective:   Encounter Medications:  Outpatient Encounter Prescriptions as of 11/10/2015  Medication Sig Note  . aspirin 325 MG tablet Take 1 tablet (325 mg total) by mouth daily.   Marland Kitchen atorvastatin (LIPITOR) 10 MG tablet Take 10 mg by mouth at bedtime.  11/10/2015: Patient taking 20 mg daily (Dosage increased from 10 mg to 20 mg on 11/06/15)  . Calcium  Carbonate-Vitamin D (CALCIUM + D PO) Take 1 tablet by mouth at bedtime.    . Glucosamine HCl-Glucosamin SO4 (GLUCOSAMINE COMPLEX PO) Take 1 tablet by mouth at bedtime.    Marland Kitchen ibuprofen (ADVIL,MOTRIN) 200 MG tablet Take 200-600 mg by mouth every 6 (six) hours as needed for moderate pain.   . Multiple Vitamin (MULITIVITAMIN WITH MINERALS) TABS Take 1 tablet by mouth daily.      No facility-administered encounter medications on file as of 11/10/2015.    Functional Status:  In your present state of health, do you have any difficulty performing the following activities: 11/10/2015 10/27/2015  Hearing? [place]? N -  Difficulty concentrating or making decisions? N -  Walking or climbing stairs? N -  Dressing or bathing? N -  Doing errands, shopping? N Y  Conservation officer, nature and eating ? N -  Using the Toilet? N -  In the past six months, have you accidently leaked urine? N -  Do you have problems with loss of bowel control? N -  Managing your Medications? N -  Managing your Finances? N -  Housekeeping or managing your Housekeeping? N -    Fall/Depression Screening: PHQ 2/9 Scores 11/10/2015 11/10/2015  PHQ - 2 Score 0 0    Fall Risk  11/10/2015  Falls in the past year? No    Assessment / Plan:  Referral Date: 11/07/15 [facility] 11/10/15 - 11/10/15  Patient with no physical changes; only speech.  Patient / Wife reports only  some noted issues with word finding and patient needs to work harder on self management interventions he has been taught to use.  Denies any further needs at this time.  RN CM provided education on option of OP ST if needed. RN CM discussed importance of heart healthy diet, hydration.  RN CM provided education on signs, symptoms of stroke.  RN CM encouraged to check BP readings daily.   [facility] Educational Mailing: -Preventing A Second Stroke:  Know The Signs Of Stroke -Dietary Fats Explained -Low-Salt Diet -What You Can Do To Prevent A Second Stroke    RN CM  advised to please notify MD of any changes in condition prior to scheduled appt's:  Elevated BP, headache, dizziness, confusion, changes in speech, weakness, balance issues.   Call 911 should stroke symptoms appear.  RN CM provided contact name and # [phone] or main office # [phone] and 24-hour nurse line # [phone].  RN CM confirmed patient is aware of 911 services for urgent emergency needs.    Nathaneil Canary, BSN, RN, [facility] Care Management Coordinator [phone] Direct [phone] Cell [phone] Office [phone] Fax Shakira [email or web address]

## 2015-11-11 ENCOUNTER — Encounter: Payer: Self-pay | Admitting: Internal Medicine

## 2015-11-11 ENCOUNTER — Ambulatory Visit (INDEPENDENT_AMBULATORY_CARE_PROVIDER_SITE_OTHER): Payer: Medicare Other | Admitting: *Deleted

## 2015-11-11 DIAGNOSIS — G459 Transient cerebral ischemic attack, unspecified: Secondary | ICD-10-CM

## 2015-11-11 LAB — CUP PACEART INCLINIC DEVICE CHECK: MDC IDC SESS DTM: 20170711161216

## 2015-11-11 NOTE — Progress Notes (Signed)
Wound check in clinic s/p ILR implant. Wound well healed without redness or edema. Incision edges approximated. Normal ILR device function. Battery status: Good. R-waves 0.37mV. 0 symptom episodes, 0 tachy episodes, 0 pause episodes, 0 brady episodes. 0 AF episodes (<0.1% burden). Monthly summary reports and ROV with GT prn.

## 2015-11-19 ENCOUNTER — Ambulatory Visit: Payer: Medicare Other | Attending: Family Medicine

## 2015-11-19 DIAGNOSIS — R482 Apraxia: Secondary | ICD-10-CM | POA: Diagnosis not present

## 2015-11-19 DIAGNOSIS — R4701 Aphasia: Secondary | ICD-10-CM | POA: Insufficient documentation

## 2015-11-19 NOTE — Patient Instructions (Signed)
Speech Exercises  Repeat each phrase 2-3 times, 2 times a day  Call the cat "Buttercup" A calendar of [place], [place] Four floors to cover Yellow oil ointment Fellow lovers of felines Catastrophe in [place]' plums The church's chimes chimed Telling time 'til eleven Five valve levers Keep the gate closed Go see that guy Fat cows give milk [employer] Gophers Fat frogs flip freely Kohl's into bed Get that game to [employer] Thick thistles stick together Cinnamon aluminum linoleum Black bugs blood Lovely lemon linament Red leather, yellow leather  Big grocery buggy    Purple baby carriage [facility] Proper copper coffee pot Ripe purple cabbage Three free throws [employer] tackled  [employer] dipped the dessert  [place] that [employer] of [employer] Shirts shrink, shells shouldn't [place] [employer]ers Take the tackle box File the flash message Give me five flapjacks Fundamental relatives Dye the pets purple Talking [place] time after time Dark chocolate chunks Political landscape of the kingdom Estate manager/land agent genius We played yo-yos yesterday

## 2015-11-20 NOTE — Therapy (Signed)
[facility] [address] [place], [place], [address] Phone: [phone]   Fax:  [phone]  Speech Language Pathology Evaluation  Patient Details  Name: Antonio Manning MRN: [id number] Date of Birth: 23-Aug-1940 Referring Provider: Hulan Fess, MD  Encounter Date: 11/19/2015      End of Session - 11/19/15 1708    Visit Number 1   Number of Visits 17   Date for SLP Re-Evaluation 01/30/16   Authorization Type no insurance information in appt notes   SLP Start Time 0805   SLP Stop Time  0850   SLP Time Calculation (min) 45 min   Activity Tolerance Patient tolerated treatment well      Past Medical History  Diagnosis Date  . Cholesterol serum elevated 06/04/11    tx. Lipitor  . Cataract 2011/06/04    right  . PONV (postoperative nausea and vomiting) 06-04-2011    with cataract surgery  . Kidney calculi 2011-06-04    past hx. x1-passed on own, mild prostate issues-no meds  . Arthritis 04-Jun-2011    osteoarthritis,lt. hip, and neck area  . Cancer ([place]) 04-Jun-2011    skin cancer lesions-multiple  . Hearing loss 2011/06/04    wears hearing aids bilaterally  . Expressive aphasia 10/2015  . Stroke [facility]) 10/2015    Past Surgical History  Procedure Laterality Date  . Back surgery  06-04-11    Lumbar disc surgery-no hardware  . Cataract extraction w/ intraocular lens implant  06-04-2011    left eye  . Total hip arthroplasty  05/20/2011    Procedure: TOTAL HIP ARTHROPLASTY ANTERIOR APPROACH;  Surgeon: Mauri Pole, MD;  Location: [facility];  Service: Orthopedics;  Laterality: Left;  Marland Kitchen Eye surgery      eye lid lift   . Ep implantable device N/A 10/29/2015    Procedure: Loop Recorder Insertion;  Surgeon: Evans Lance, MD;  Location: [facility];  Service: Cardiovascular;  Laterality: N/A;  . Tee without cardioversion N/A 10/29/2015    Procedure: TRANSESOPHAGEAL ECHOCARDIOGRAM (TEE);  Surgeon: Larey Dresser, MD;  Location: [facility];  Service: Cardiovascular;  Laterality: N/A;    There were no vitals filed for this visit.      Subjective Assessment - 11/19/15 0943    Subjective "It's forming the words." (as apposed to finding the correct name for the thought)    Currently in Pain? No/denies            SLP Evaluation [facility] - 11/20/15 0001    SLP Visit Information   SLP Received On 11/19/15   Onset Date June 2017   Medical Diagnosis CVA   Pain Assessment   Currently in Pain? No/denies   General Information   HPI Pt is a 75 y.o. male with PMH significant for hyperlipidemia, presented to ED 6/26 with aphasia. In ED pt was without weakness, able to write short sentences but unable to speak; when able to get words out speech is slurred. MRI showed small acute L posterior frontal lobe infarct. Pt passed RN stroke swallow screen and denies overt s/s aspiration with POs.   Prior Functional Status   Cognitive/Linguistic Baseline Within functional limits   Type of Home House    Lives With Spouse   Vocation Full time employment   Cognition   Overall Cognitive Status Within Functional Limits for tasks assessed   Auditory Comprehension   Overall Auditory Comprehension Appears within functional limits for tasks assessed   Verbal Expression  Overall Verbal Expression Appears within functional limits for tasks assessed   Written Expression   Written Expression Exceptions to [facility]   Dictation Ability Word;Phrase;Sentence   Self Formulation Ability --  pt unwilling to attempt spontaneous written trials, but reports deficits with written language ("spelling") since the CVA   Motor Speech   Overall Motor Speech Impaired   Respiration Within functional limits   Phonation Normal   Articulation Impaired   Level of Impairment Sentence   Intelligibility Intelligible   Motor Planning Impaired   Level of Impairment Word   Motor Speech Errors Aware;Groping for words;Inconsistent   Effective Techniques Pacing;Slow  rate                         SLP Education - 11/20/15 1707    Education provided Yes   Education Details HEP   Person(s) Educated Patient   Methods Explanation;Demonstration;Verbal cues   Comprehension Verbalized understanding;Returned demonstration;Need further instruction;Verbal cues required          SLP Short Term Goals - December 12, 2015 1711    SLP SHORT TERM GOAL #1   Title pt will perform HEP for accuracy with motor planning with occasional min A   Time 4   Period Weeks   Status New   SLP SHORT TERM GOAL #2   Title pt will demo functional 10 minute simple conversation using compensations    Time 4   Period Weeks   Status New   SLP SHORT TERM GOAL #3   Title pt will write short (2-3 sentence) texts/emails/notes with emergent awareness PRN   Time 4   Period Weeks   Status New          SLP Long Term Goals - 2015-12-12 1713    SLP LONG TERM GOAL #1   Title pt will demo HEP for motor planning with rare min A   Time 8   Period Weeks   Status New   SLP LONG TERM GOAL #2   Title pt will demo functional mod complex conversation for 10 minutes with compensation use   Time 8   Period Weeks   Status New   SLP LONG TERM GOAL #3   Title pt will demo functional written 4-5 sentence selection (email/letter/text, etc) with emergent awareness PRN   Time 8   Period Weeks   Status New          Plan - 12/12/2015 1709    Clinical Impression Statement Pt presents with mild-mod apraxia of speech without notable oral nonverbal apraxia. He reports written aphasia however minimal testing today did not show any of this. Further testing will be necessary in this area. Pt will beneift from skilled ST to address pt's motor speech deficits as well as assess and address written aphasia.   Speech Therapy Frequency 2x / week   Duration --  8 weeks   Treatment/Interventions SLP instruction and feedback;Compensatory strategies;Patient/family education;Cueing hierarchy;Functional  tasks;Language facilitation   Potential to Achieve Goals Good   Consulted and Agree with Plan of Care Patient      Patient will benefit from skilled therapeutic intervention in order to improve the following deficits and impairments:   Apraxia  Aphasia      G-Codes - 2015/12/12 1733    Functional Assessment Tool Used noms - 35%   Functional Limitations Motor speech   Motor Speech Current Status [phone]) At least 20 percent but less than 40 percent impaired, limited or restricted  Motor Speech Goal Status [phone]) At least 1 percent but less than 20 percent impaired, limited or restricted      Problem List Patient Active Problem List   Diagnosis Date Noted  . Expressive aphasia 10/27/2015  . Cerebral thrombosis with cerebral infarction 10/27/2015  . Aphasia 10/27/2015  . Acute CVA (cerebrovascular accident) ([place]) 10/27/2015  . Thoracic aorta atherosclerosis ([place]) 04/04/2014  . Hyperlipidemia 04/04/2014  . Fatigue 10/30/2013  . Bradycardia 10/30/2013  . S/P left hip replacement 05/21/2011    [facility] ,MS, CCC-SLP  11/20/2015, 5:15 PM  [facility] [address] [place], [place], [address] Phone: [phone]   Fax:  [phone]  Name: Antonio Manning MRN: [id number] Date of Birth: December 07, 1940

## 2015-11-24 ENCOUNTER — Encounter: Payer: Self-pay | Admitting: Internal Medicine

## 2015-11-25 ENCOUNTER — Telehealth: Payer: Self-pay | Admitting: *Deleted

## 2015-11-25 DIAGNOSIS — I4892 Unspecified atrial flutter: Secondary | ICD-10-CM

## 2015-11-25 NOTE — Telephone Encounter (Signed)
Reviewed tachy episode from 11/20/15 with Dr. Lovena Le.  Per Dr. Lovena Le, episode suggests paroxysmal atrial flutter/fib.  Patient and wife made aware and agreeable to seeing Dr. Lovena Le on 11/27/15 at 9:00am to discuss [place] per Dr. Tanna Furry recommendation.

## 2015-11-27 ENCOUNTER — Encounter: Payer: Self-pay | Admitting: Internal Medicine

## 2015-11-27 ENCOUNTER — Encounter (INDEPENDENT_AMBULATORY_CARE_PROVIDER_SITE_OTHER): Payer: Self-pay

## 2015-11-27 ENCOUNTER — Telehealth: Payer: Self-pay

## 2015-11-27 ENCOUNTER — Ambulatory Visit (INDEPENDENT_AMBULATORY_CARE_PROVIDER_SITE_OTHER): Payer: Medicare Other | Admitting: Internal Medicine

## 2015-11-27 VITALS — BP 120/70 | HR 57 | Ht 72.0 in | Wt 159.6 lb

## 2015-11-27 DIAGNOSIS — I639 Cerebral infarction, unspecified: Secondary | ICD-10-CM

## 2015-11-27 DIAGNOSIS — G459 Transient cerebral ischemic attack, unspecified: Secondary | ICD-10-CM | POA: Diagnosis not present

## 2015-11-27 DIAGNOSIS — I4892 Unspecified atrial flutter: Secondary | ICD-10-CM | POA: Diagnosis not present

## 2015-11-27 MED ORDER — APIXABAN 5 MG PO TABS: 5.0000 mg | ORAL_TABLET | Freq: Two times a day (BID) | ORAL | 0 refills | Status: DC

## 2015-11-27 NOTE — Telephone Encounter (Signed)
Prior auth for Eliquis 5 mg sent to [facility]. 

## 2015-11-27 NOTE — Patient Instructions (Signed)
Medication Instructions:  Your physician has recommended you make the following change in your medication:  1) Stop Aspirin 2) Start Eliquis 5 mg twice daily   Labwork: None ordered   Testing/Procedures: None ordered   Follow-Up: Your physician wants you to follow-up in: 6 months with Dr Knox Saliva will receive a reminder letter in the mail two months in advance. If you don't receive a letter, please call our office to schedule the follow-up appointment.   Any Other Special Instructions Will Be Listed Below (If Applicable).     If you need a refill on your cardiac medications before your next appointment, please call your pharmacy.

## 2015-11-27 NOTE — Progress Notes (Signed)
HPI Antonio Manning returns today to discussed atrial arrhythmias seen on his ILR. He is a pleasant 75 yo man with a recent cryptogenic stroke, who underwent ILR insertion. He has been found to be out of rhythm with both atrial flutter (mostly) and atrial fib. He is minimally symptomatic. His episodes have lasted up to 4 minutes. He feels tired. Allergies  Allergen Reactions  . Codeine Nausea And Vomiting     Current Outpatient Prescriptions  Medication Sig Dispense Refill  . acetaminophen (TYLENOL) 500 MG tablet Take 1,000 mg by mouth every 6 (six) hours as needed (pain).    Marland Kitchen aspirin 325 MG tablet Take 1 tablet (325 mg total) by mouth daily. 30 tablet 0  . atorvastatin (LIPITOR) 20 MG tablet Take 1 tablet by mouth daily.  3  . Calcium Carbonate-Vitamin D (CALCIUM + D PO) Take 1 tablet by mouth at bedtime.     Marland Kitchen GLUCOSAMINE-CHONDROITIN PO Take 1 tablet by mouth daily. Triple strength    . Multiple Vitamin (MULITIVITAMIN WITH MINERALS) TABS Take 1 tablet by mouth daily.       No current facility-administered medications for this visit.      Past Medical History:  Diagnosis Date  . Arthritis May 31, 2011   osteoarthritis,lt. hip, and neck area  . Cancer ([place]) May 31, 2011   skin cancer lesions-multiple  . Cataract May 31, 2011   right  . Cholesterol serum elevated 2011-05-31   tx. Lipitor  . Expressive aphasia 10/2015  . Hearing loss 05/31/2011   wears hearing aids bilaterally  . Kidney calculi May 31, 2011   past hx. x1-passed on own, mild prostate issues-no meds  . PONV (postoperative nausea and vomiting) 2011/05/31   with cataract surgery  . Stroke ([place]) 10/2015    ROS:   All systems reviewed and negative except as noted in the HPI.   Past Surgical History:  Procedure Laterality Date  . BACK SURGERY  31-May-2011   Lumbar disc surgery-no hardware  . CATARACT EXTRACTION W/ INTRAOCULAR LENS IMPLANT  05/31/11   left eye  . EP IMPLANTABLE DEVICE N/A 10/29/2015   Procedure: Loop Recorder Insertion;   Surgeon: Evans Lance, MD;  Location: [facility];  Service: Cardiovascular;  Laterality: N/A;  . EYE SURGERY     eye lid lift   . TEE WITHOUT CARDIOVERSION N/A 10/29/2015   Procedure: TRANSESOPHAGEAL ECHOCARDIOGRAM (TEE);  Surgeon: Larey Dresser, MD;  Location: [facility];  Service: Cardiovascular;  Laterality: N/A;  . TOTAL HIP ARTHROPLASTY  05/20/2011   Procedure: TOTAL HIP ARTHROPLASTY ANTERIOR APPROACH;  Surgeon: Mauri Pole, MD;  Location: [facility];  Service: Orthopedics;  Laterality: Left;     Family History  Problem Relation Age of Onset  . Heart Problems Mother   . Cancer Father   . Cancer Brother   . Stroke Brother   . Heart Problems Brother   . Cancer Brother   . Heart Problems Brother   . Heart Problems Sister   . Hyperlipidemia Sister   . Birth defects Child   . Other Child     one kidney     Social History   Social History  . Marital status: Married    Spouse name: N/A  . Number of children: 3  . Years of education: 12   Occupational History  . lawn service - retired    Social History Main Topics  . Smoking status: Former Smoker    Years: 25.00    Types: Cigarettes  Quit date: 05/12/1988  . Smokeless tobacco: Never Used  . Alcohol use No  . Drug use: No  . Sexual activity: Yes   Other Topics Concern  . Not on file   Social History Narrative  . No narrative on file     BP 120/70   Pulse (!) 57   Ht 6' (1.829 m)   Wt 159 lb 9.6 oz (72.4 kg)   BMI 21.65 kg/m   Physical Exam:  Well appearing NAD HEENT: Unremarkable Neck:  No JVD, no thyromegally Lymphatics:  No adenopathy Back:  No CVA tenderness Lungs:  Clear with no wheezes HEART:  Regular rate rhythm, no murmurs, no rubs, no clicks Abd:  soft, positive bowel sounds, no organomegally, no rebound, no guarding Ext:  2 plus pulses, no edema, no cyanosis, no clubbing Skin:  No rashes no nodules Neuro:  CN II through XII intact, motor grossly intact   ILR - episodes of  atrial flutter and fib with no bradycardia  Assess/Plan: 1. Cryptogenic stroke - he appears to be having atrial fib and flutter. Will start anti-coag and stop ASA 2. PAF - he looks to be having both flutter and fib. We will undergo watchful waiting. 3. Dyslipidemia - he will continue his statin therapy. 4. Fatigue - the etiology is unclear. He has normal LV function. I think he may be working too much. He is encouraged to cut back and see if he feels any better.  Cristopher Peru, M.D.

## 2015-11-28 ENCOUNTER — Ambulatory Visit (INDEPENDENT_AMBULATORY_CARE_PROVIDER_SITE_OTHER): Payer: Medicare Other | Admitting: *Deleted

## 2015-11-28 DIAGNOSIS — I4892 Unspecified atrial flutter: Secondary | ICD-10-CM

## 2015-12-02 ENCOUNTER — Telehealth: Payer: Self-pay

## 2015-12-02 NOTE — Progress Notes (Signed)
[facility] Summary Report / Loop Recorder 

## 2015-12-02 NOTE — Telephone Encounter (Signed)
Eliquis 5 mg approved by [employer]. Good through 11/26/2018

## 2015-12-08 ENCOUNTER — Encounter: Payer: Medicare Other | Admitting: *Deleted

## 2015-12-19 ENCOUNTER — Encounter: Payer: Self-pay | Admitting: Neurology

## 2015-12-19 ENCOUNTER — Ambulatory Visit (INDEPENDENT_AMBULATORY_CARE_PROVIDER_SITE_OTHER): Payer: Medicare Other | Admitting: Neurology

## 2015-12-19 VITALS — BP 124/73 | HR 54 | Ht 71.0 in | Wt 159.4 lb

## 2015-12-19 DIAGNOSIS — I63412 Cerebral infarction due to embolism of left middle cerebral artery: Secondary | ICD-10-CM

## 2015-12-19 DIAGNOSIS — I639 Cerebral infarction, unspecified: Secondary | ICD-10-CM

## 2015-12-19 NOTE — Patient Instructions (Signed)
I had a long d/w patient about his recent stroke, atrial fibrillation,risk for recurrent stroke/TIAs, personally independently reviewed imaging studies and stroke evaluation results and answered questions.Continue Eliquis (apixaban) daily  for secondary stroke prevention and maintain strict control of hypertension with blood pressure goal below 130/90, diabetes with hemoglobin A1c goal below 6.5% and lipids with LDL cholesterol goal below 70 mg/dL. I also advised the patient to eat a healthy diet with plenty of whole grains, cereals, fruits and vegetables, exercise regularly and maintain ideal body weight Followup in the future with troponins practitioner in 6 months or call earlier if necessary Stroke Prevention Some medical conditions and behaviors are associated with an increased chance of having a stroke. You may prevent a stroke by making healthy choices and managing medical conditions. HOW CAN I REDUCE MY RISK OF HAVING A STROKE?   Stay physically active. Get at least 30 minutes of activity on most or all days.  Do not smoke. It may also be helpful to avoid exposure to secondhand smoke.  Limit alcohol use. Moderate alcohol use is considered to be:  No more than 2 drinks per day for men.  No more than 1 drink per day for nonpregnant women.  Eat healthy foods. This involves:  Eating 5 or more servings of fruits and vegetables a day.  Making dietary changes that address high blood pressure (hypertension), high cholesterol, diabetes, or obesity.  Manage your cholesterol levels.  Making food choices that are high in fiber and low in saturated fat, trans fat, and cholesterol may control cholesterol levels.  Take any prescribed medicines to control cholesterol as directed by your health care provider.  Manage your diabetes.  Controlling your carbohydrate and sugar intake is recommended to manage diabetes.  Take any prescribed medicines to control diabetes as directed by your health care  provider.  Control your hypertension.  Making food choices that are low in salt (sodium), saturated fat, trans fat, and cholesterol is recommended to manage hypertension.  Ask your health care provider if you need treatment to lower your blood pressure. Take any prescribed medicines to control hypertension as directed by your health care provider.  If you are 19-39 years of age, have your blood pressure checked every 3-5 years. If you are 59 years of age or older, have your blood pressure checked every year.  Maintain a healthy weight.  Reducing calorie intake and making food choices that are low in sodium, saturated fat, trans fat, and cholesterol are recommended to manage weight.  Stop drug abuse.  Avoid taking birth control pills.  Talk to your health care provider about the risks of taking birth control pills if you are over 97 years old, smoke, get migraines, or have ever had a blood clot.  Get evaluated for sleep disorders (sleep apnea).  Talk to your health care provider about getting a sleep evaluation if you snore a lot or have excessive sleepiness.  Take medicines only as directed by your health care provider.  For some people, aspirin or blood thinners (anticoagulants) are helpful in reducing the risk of forming abnormal blood clots that can lead to stroke. If you have the irregular heart rhythm of atrial fibrillation, you should be on a blood thinner unless there is a good reason you cannot take them.  Understand all your medicine instructions.  Make sure that other conditions (such as anemia or atherosclerosis) are addressed. SEEK IMMEDIATE MEDICAL CARE IF:   You have sudden weakness or numbness of the face, arm,  or leg, especially on one side of the body.  Your face or eyelid droops to one side.  You have sudden confusion.  You have trouble speaking (aphasia) or understanding.  You have sudden trouble seeing in one or both eyes.  You have sudden trouble  walking.  You have dizziness.  You have a loss of balance or coordination.  You have a sudden, severe headache with no known cause.  You have new chest pain or an irregular heartbeat. Any of these symptoms may represent a serious problem that is an emergency. Do not wait to see if the symptoms will go away. Get medical help at once. Call your local emergency services (911 in [place] Do not drive yourself to the hospital.   This information is not intended to replace advice given to you by your health care provider. Make sure you discuss any questions you have with your health care provider.   Document Released: 05/27/2004 Document Revised: 05/10/2014 Document Reviewed: 10/20/2012 Elsevier Interactive Patient Education [employer].

## 2015-12-19 NOTE — Progress Notes (Signed)
[facility] [address] [place]. [place] [address] [phone]       OFFICE FOLLOW UP VISIT NOTE  [name] Date of Birth:  [birth year] Medical Record Number:  [id number]   Referring MD:  Antonio Manning  Reason for Referral:  stroke HPI: [name] is a 25 and a Caucasian male seen today for first office follow-up visit following hospital admission for stroke in June 2017. Antonio Manning is an 75 y.o. male with a history of hyperlipidemia, arthritis and hearing loss, brought to the emergency room and code stroke status following acute onset of expressive aphasia. Patient was last known well at 9:30 PM 10/26/2015. He woke up at 2 AM and was unable to express himself verbally. He can understand well what was being said to him. Right facial droop was noted by his wife. No weakness of extremities was noted. CT scan of his head showed no acute intracranial abnormality. Patient had been taking aspirin daily. NIH stroke score was 4. Patient was beyond time window for treatment with IV TPA when he arrived in the emergency room. He was admitted for further evaluation and treatment. CT scan that showed no acute abnormality but MRI scan showed a small left frontal MCA branch infarct and changes of small vessel disease. MRA of the brain showed no emergent large vessel occlusion on carotid ultrasound showed no significant extracranial stenosis. Transthoracic echo showed normal ejection fraction. Transesophageal echo showed no current x-rays of embolism. A small patent foramen ovale was noted. Lower extremity venous Dopplers did not show definite deep and thrombosis. LDL cholesterol of 88 mg percent. Hemoglobin A1c was 5.6. Patient had a loop recorder inserted and 15 fact paroxysmal to fibrillation was found 3 weeks later. He has been started on eliquis which is considered but tolerating well. He does work outdoors a lot with pushes and so far has not had any major problems with bruising or  bleeding. He is tolerating Lipitor well without muscle aches and pains. He states his speech has recovered completely but he does get tired more easily and minutes times times he may need to struggle with a few words. He states his blood pressure is well controlled and today it is 129/73.  ROS:   14 system review of systems is positive for  fatigue, activity and appetite change, shortness of breath, speech difficulty, tiredness and all other systems negative  PMH:  Past Medical History:  Diagnosis Date  . Arthritis 05/24/2011   osteoarthritis,lt. hip, and neck area  . Cancer ([place]) 2011/05/24   skin cancer lesions-multiple  . Cataract 24-May-2011   right  . Cholesterol serum elevated May 24, 2011   tx. Lipitor  . Expressive aphasia 10/2015  . Hearing loss 05-24-11   wears hearing aids bilaterally  . Kidney calculi 2011/05/24   past hx. x1-passed on own, mild prostate issues-no meds  . PONV (postoperative nausea and vomiting) May 24, 2011   with cataract surgery  . Stroke [facility]) 10/2015    Social History:  Social History   Social History  . Marital status: Married    Spouse name: N/A  . Number of children: 3  . Years of education: 12   Occupational History  . lawn service - retired    Social History Main Topics  . Smoking status: Former Smoker    Years: 25.00    Types: Cigarettes    Quit date: May 23, 1988  . Smokeless tobacco: Never Used  . Alcohol use No  . Drug use: No  .  Sexual activity: Yes   Other Topics Concern  . Not on file   Social History Narrative  . No narrative on file    Medications:   Current Outpatient Prescriptions on File Prior to Visit  Medication Sig Dispense Refill  . acetaminophen (TYLENOL) 500 MG tablet Take 1,000 mg by mouth every 6 (six) hours as needed (pain).    Marland Kitchen apixaban (ELIQUIS) 5 MG TABS tablet Take 1 tablet (5 mg total) by mouth 2 (two) times daily. 60 tablet 0  . atorvastatin (LIPITOR) 20 MG tablet Take 1 tablet by mouth daily.  3  . Calcium  Carbonate-Vitamin D (CALCIUM + D PO) Take 1 tablet by mouth at bedtime.     Marland Kitchen GLUCOSAMINE-CHONDROITIN PO Take 1 tablet by mouth daily. Triple strength    . Multiple Vitamin (MULITIVITAMIN WITH MINERALS) TABS Take 1 tablet by mouth daily.       No current facility-administered medications on file prior to visit.     Allergies:   Allergies  Allergen Reactions  . Codeine Nausea And Vomiting    Physical Exam General: well developed, well nourished elderly Caucasian male, seated, in no evident distress Head: head normocephalic and atraumatic.   Neck: supple with no carotid or supraclavicular bruits Cardiovascular: regular rate and rhythm, no murmurs Musculoskeletal: no deformity Skin:  no rash/petichiae Vascular:  Normal pulses all extremities  Neurologic Exam Mental Status: Awake and fully alert. Oriented to place and time. Recent and remote memory intact. Attention span, concentration and fund of knowledge appropriate. Mood and affect appropriate.  Cranial Nerves: Fundoscopic exam reveals sharp disc margins. Pupils equal, briskly reactive to light. Extraocular movements full without nystagmus. Visual fields full to confrontation. Hearing intact. Facial sensation intact. Face, tongue, palate moves normally and symmetrically.  Motor: Normal bulk and tone. Normal strength in all tested extremity muscles. Sensory.: intact to touch , pinprick , position and vibratory sensation.  Coordination: Rapid alternating movements normal in all extremities. Finger-to-nose and heel-to-shin performed accurately bilaterally. Gait and Station: Arises from chair without difficulty. Stance is normal. Gait demonstrates normal stride length and balance . Able to heel, toe and tandem walk without difficulty.  Reflexes: 1+ and symmetric. Toes downgoing.   NIHSS  0 Modified Rankin  1   ASSESSMENT: 75 year old Caucasian male with left MCA branch infarct in June 0000000 of embolic etiology due to paroxysmal atrial  fibrillation. Vascular risk factors of atrial fibrillation and hyperlipidemia and borderline diabetes    PLAN: I had a long d/w patient and his wife about his recent stroke, atrial fibrillation,risk for recurrent stroke/TIAs, personally independently reviewed imaging studies and stroke evaluation results and answered questions.Continue Eliquis (apixaban) daily  for secondary stroke prevention and maintain strict control of hypertension with blood pressure goal below 130/90, diabetes with hemoglobin A1c goal below 6.5% and lipids with LDL cholesterol goal below 70 mg/dL. I also advised the patient to eat a healthy diet with plenty of whole grains, cereals, fruits and vegetables, exercise regularly and maintain ideal body weight.greater than 50% time during this 25 minute visit was spent on counseling and coordination of care about stroke and atrial fibrillation Followup in the future with troponins practitioner in 6 months or call earlier if necessary Antony Contras, MD  [facility] [address] [place], [place] [address]  Phone [phone] Fax [phone] Note: This document was prepared with digital dictation and possible smart phrase technology. Any transcriptional errors that result from this process are unintentional.

## 2015-12-28 LAB — CUP PACEART REMOTE DEVICE CHECK: Date Time Interrogation Session: 20170728193557

## 2015-12-29 ENCOUNTER — Ambulatory Visit (INDEPENDENT_AMBULATORY_CARE_PROVIDER_SITE_OTHER): Payer: Medicare Other | Admitting: *Deleted

## 2015-12-29 DIAGNOSIS — I4892 Unspecified atrial flutter: Secondary | ICD-10-CM | POA: Diagnosis not present

## 2015-12-30 NOTE — Progress Notes (Signed)
[facility] Summary Report / Loop Recorder 

## 2016-01-17 ENCOUNTER — Emergency Department (HOSPITAL_COMMUNITY): Payer: Medicare Other

## 2016-01-17 ENCOUNTER — Observation Stay (HOSPITAL_COMMUNITY)
Admission: EM | Admit: 2016-01-17 | Discharge: 2016-01-18 | Disposition: A | Discharge status: Home / Self Care | Payer: Medicare Other | Attending: Orthopedic Surgery | Admitting: Orthopedic Surgery

## 2016-01-17 ENCOUNTER — Encounter (HOSPITAL_COMMUNITY)
Admission: EM | Disposition: A | Discharge status: Home / Self Care | Payer: Self-pay | Source: Home / Self Care | Attending: Emergency Medicine

## 2016-01-17 ENCOUNTER — Encounter (HOSPITAL_COMMUNITY): Payer: Self-pay

## 2016-01-17 ENCOUNTER — Emergency Department (HOSPITAL_COMMUNITY): Payer: Medicare Other | Admitting: Anesthesiology

## 2016-01-17 DIAGNOSIS — S81811A Laceration without foreign body, right lower leg, initial encounter: Secondary | ICD-10-CM | POA: Diagnosis not present

## 2016-01-17 DIAGNOSIS — Z9842 Cataract extraction status, left eye: Secondary | ICD-10-CM | POA: Diagnosis not present

## 2016-01-17 DIAGNOSIS — Z87891 Personal history of nicotine dependence: Secondary | ICD-10-CM | POA: Insufficient documentation

## 2016-01-17 DIAGNOSIS — Z85828 Personal history of other malignant neoplasm of skin: Secondary | ICD-10-CM | POA: Insufficient documentation

## 2016-01-17 DIAGNOSIS — Z8673 Personal history of transient ischemic attack (TIA), and cerebral infarction without residual deficits: Secondary | ICD-10-CM | POA: Diagnosis not present

## 2016-01-17 DIAGNOSIS — Z87442 Personal history of urinary calculi: Secondary | ICD-10-CM | POA: Diagnosis not present

## 2016-01-17 DIAGNOSIS — Z885 Allergy status to narcotic agent status: Secondary | ICD-10-CM | POA: Diagnosis not present

## 2016-01-17 DIAGNOSIS — Y9389 Activity, other specified: Secondary | ICD-10-CM | POA: Insufficient documentation

## 2016-01-17 DIAGNOSIS — Y998 Other external cause status: Secondary | ICD-10-CM | POA: Insufficient documentation

## 2016-01-17 DIAGNOSIS — I739 Peripheral vascular disease, unspecified: Secondary | ICD-10-CM | POA: Insufficient documentation

## 2016-01-17 DIAGNOSIS — S81819A Laceration without foreign body, unspecified lower leg, initial encounter: Secondary | ICD-10-CM | POA: Diagnosis not present

## 2016-01-17 DIAGNOSIS — I700 Atherosclerosis of aorta: Secondary | ICD-10-CM | POA: Diagnosis not present

## 2016-01-17 DIAGNOSIS — I6782 Cerebral ischemia: Secondary | ICD-10-CM | POA: Insufficient documentation

## 2016-01-17 DIAGNOSIS — M1991 Primary osteoarthritis, unspecified site: Secondary | ICD-10-CM | POA: Insufficient documentation

## 2016-01-17 DIAGNOSIS — Z96642 Presence of left artificial hip joint: Secondary | ICD-10-CM | POA: Insufficient documentation

## 2016-01-17 DIAGNOSIS — M1612 Unilateral primary osteoarthritis, left hip: Secondary | ICD-10-CM | POA: Insufficient documentation

## 2016-01-17 DIAGNOSIS — Z7901 Long term (current) use of anticoagulants: Secondary | ICD-10-CM | POA: Insufficient documentation

## 2016-01-17 DIAGNOSIS — Z961 Presence of intraocular lens: Secondary | ICD-10-CM | POA: Insufficient documentation

## 2016-01-17 DIAGNOSIS — E785 Hyperlipidemia, unspecified: Secondary | ICD-10-CM | POA: Insufficient documentation

## 2016-01-17 DIAGNOSIS — I4892 Unspecified atrial flutter: Secondary | ICD-10-CM | POA: Diagnosis not present

## 2016-01-17 DIAGNOSIS — S0990XA Unspecified injury of head, initial encounter: Secondary | ICD-10-CM | POA: Diagnosis not present

## 2016-01-17 DIAGNOSIS — S8991XA Unspecified injury of right lower leg, initial encounter: Secondary | ICD-10-CM | POA: Diagnosis not present

## 2016-01-17 DIAGNOSIS — Y9289 Other specified places as the place of occurrence of the external cause: Secondary | ICD-10-CM | POA: Diagnosis not present

## 2016-01-17 DIAGNOSIS — IMO0002 Reserved for concepts with insufficient information to code with codable children: Secondary | ICD-10-CM

## 2016-01-17 DIAGNOSIS — I4891 Unspecified atrial fibrillation: Secondary | ICD-10-CM | POA: Insufficient documentation

## 2016-01-17 DIAGNOSIS — R4182 Altered mental status, unspecified: Secondary | ICD-10-CM | POA: Insufficient documentation

## 2016-01-17 DIAGNOSIS — W228XXA Striking against or struck by other objects, initial encounter: Secondary | ICD-10-CM | POA: Insufficient documentation

## 2016-01-17 DIAGNOSIS — W28XXXA Contact with powered lawn mower, initial encounter: Secondary | ICD-10-CM | POA: Diagnosis not present

## 2016-01-17 DIAGNOSIS — Z9889 Other specified postprocedural states: Secondary | ICD-10-CM

## 2016-01-17 DIAGNOSIS — T148 Other injury of unspecified body region: Secondary | ICD-10-CM | POA: Diagnosis not present

## 2016-01-17 DIAGNOSIS — W19XXXA Unspecified fall, initial encounter: Secondary | ICD-10-CM

## 2016-01-17 HISTORY — PX: I & D EXTREMITY: SHX5045

## 2016-01-17 LAB — CBC
HCT: 38.4 % — ABNORMAL LOW (ref 39.0–52.0)
HCT: 37 % — ABNORMAL LOW (ref 39.0–52.0)
HEMOGLOBIN: 12.2 g/dL — AB (ref 13.0–17.0)
Hemoglobin: 12.4 g/dL — ABNORMAL LOW (ref 13.0–17.0)
MCH: 29.7 pg (ref 26.0–34.0)
MCH: 30.3 pg (ref 26.0–34.0)
MCHC: 32.3 g/dL (ref 30.0–36.0)
MCHC: 33 g/dL (ref 30.0–36.0)
MCV: 92.1 fL (ref 78.0–100.0)
MCV: 91.8 fL (ref 78.0–100.0)
PLATELETS: 237 10*3/uL (ref 150–400)
PLATELETS: 117 10*3/uL — AB (ref 150–400)
RBC: 4.17 MIL/uL — ABNORMAL LOW (ref 4.22–5.81)
RBC: 4.03 MIL/uL — ABNORMAL LOW (ref 4.22–5.81)
RDW: 14 % (ref 11.5–15.5)
RDW: 14 % (ref 11.5–15.5)
WBC: 5.8 10*3/uL (ref 4.0–10.5)
WBC: 3.9 10*3/uL — ABNORMAL LOW (ref 4.0–10.5)

## 2016-01-17 LAB — I-STAT CHEM 8, ED
BUN: 30 mg/dL — ABNORMAL HIGH (ref 6–20)
CALCIUM ION: 1.18 mmol/L (ref 1.15–1.40)
Chloride: 104 mmol/L (ref 101–111)
Creatinine, Ser: 1 mg/dL (ref 0.61–1.24)
Glucose, Bld: 126 mg/dL — ABNORMAL HIGH (ref 65–99)
HCT: 40 % (ref 39.0–52.0)
Hemoglobin: 13.6 g/dL (ref 13.0–17.0)
Potassium: 4.2 mmol/L (ref 3.5–5.1)
SODIUM: 143 mmol/L (ref 135–145)
TCO2: 26 mmol/L (ref 0–100)

## 2016-01-17 LAB — URINALYSIS, ROUTINE W REFLEX MICROSCOPIC
BILIRUBIN URINE: NEGATIVE
Glucose, UA: NEGATIVE mg/dL
HGB URINE DIPSTICK: NEGATIVE
Ketones, ur: NEGATIVE mg/dL
Leukocytes, UA: NEGATIVE
Nitrite: NEGATIVE
PROTEIN: NEGATIVE mg/dL
Specific Gravity, Urine: 1.03 (ref 1.005–1.030)
pH: 5 (ref 5.0–8.0)

## 2016-01-17 LAB — BASIC METABOLIC PANEL
Anion gap: 7 (ref 5–15)
BUN: 24 mg/dL — AB (ref 6–20)
CALCIUM: 8.8 mg/dL — AB (ref 8.9–10.3)
CO2: 27 mmol/L (ref 22–32)
CREATININE: 1.02 mg/dL (ref 0.61–1.24)
Chloride: 109 mmol/L (ref 101–111)
Glucose, Bld: 91 mg/dL (ref 65–99)
Potassium: 4.3 mmol/L (ref 3.5–5.1)
SODIUM: 143 mmol/L (ref 135–145)

## 2016-01-17 LAB — PROTIME-INR
INR: 1.28
PROTHROMBIN TIME: 16 s — AB (ref 11.4–15.2)

## 2016-01-17 SURGERY — IRRIGATION AND DEBRIDEMENT EXTREMITY
Anesthesia: General | Site: Leg Lower | Laterality: Right

## 2016-01-17 MED ORDER — CEFAZOLIN IN D5W 1 GM/50ML IV SOLN
INTRAVENOUS | Status: DC | PRN
  Administered 2016-01-17: 1 g via INTRAVENOUS

## 2016-01-17 MED ORDER — APIXABAN 5 MG PO TABS
5.0000 mg | ORAL_TABLET | Freq: Two times a day (BID) | ORAL | Status: DC
  Administered 2016-01-17 – 2016-01-18 (×2): 5 mg via ORAL
  Filled 2016-01-17 (×2): qty 1

## 2016-01-17 MED ORDER — ONDANSETRON HCL 4 MG PO TABS: 4.0000 mg | ORAL_TABLET | Freq: Four times a day (QID) | ORAL | Status: DC | PRN

## 2016-01-17 MED ORDER — EPHEDRINE SULFATE 50 MG/ML IJ SOLN
INTRAMUSCULAR | Status: DC | PRN
  Administered 2016-01-17: 10 mg via INTRAVENOUS

## 2016-01-17 MED ORDER — FENTANYL CITRATE (PF) 100 MCG/2ML IJ SOLN
50.0000 ug | Freq: Once | INTRAMUSCULAR | Status: AC
  Administered 2016-01-17: 50 ug via INTRAVENOUS
  Filled 2016-01-17: qty 2

## 2016-01-17 MED ORDER — MEPERIDINE HCL 25 MG/ML IJ SOLN
INTRAMUSCULAR | Status: AC
  Filled 2016-01-17: qty 1

## 2016-01-17 MED ORDER — TETANUS-DIPHTH-ACELL PERTUSSIS 5-2.5-18.5 LF-MCG/0.5 IM SUSP
0.5000 mL | Freq: Once | INTRAMUSCULAR | Status: AC
  Administered 2016-01-17: 0.5 mL via INTRAMUSCULAR
  Filled 2016-01-17: qty 0.5

## 2016-01-17 MED ORDER — ROCURONIUM BROMIDE 100 MG/10ML IV SOLN
INTRAVENOUS | Status: DC | PRN
  Administered 2016-01-17: 50 mg via INTRAVENOUS

## 2016-01-17 MED ORDER — MEPERIDINE HCL 25 MG/ML IJ SOLN
6.2500 mg | INTRAMUSCULAR | Status: DC | PRN
  Administered 2016-01-17: 12.5 mg via INTRAVENOUS

## 2016-01-17 MED ORDER — HYDROCODONE-ACETAMINOPHEN 7.5-325 MG PO TABS
1.0000 | ORAL_TABLET | Freq: Four times a day (QID) | ORAL | Status: DC
  Administered 2016-01-18: 1 via ORAL
  Filled 2016-01-17: qty 1

## 2016-01-17 MED ORDER — FENTANYL CITRATE (PF) 100 MCG/2ML IJ SOLN
INTRAMUSCULAR | Status: AC
  Filled 2016-01-17: qty 2

## 2016-01-17 MED ORDER — SUCCINYLCHOLINE CHLORIDE 200 MG/10ML IV SOSY
PREFILLED_SYRINGE | INTRAVENOUS | Status: AC
  Filled 2016-01-17: qty 10

## 2016-01-17 MED ORDER — LIDOCAINE HCL (CARDIAC) 20 MG/ML IV SOLN
INTRAVENOUS | Status: DC | PRN
  Administered 2016-01-17: 60 mg via INTRATRACHEAL

## 2016-01-17 MED ORDER — ACETAMINOPHEN 650 MG RE SUPP: 650.0000 mg | Freq: Four times a day (QID) | RECTAL | Status: DC | PRN

## 2016-01-17 MED ORDER — ATORVASTATIN CALCIUM 20 MG PO TABS
20.0000 mg | ORAL_TABLET | Freq: Every day | ORAL | Status: DC
  Administered 2016-01-17: 20 mg via ORAL
  Filled 2016-01-17: qty 1

## 2016-01-17 MED ORDER — FENTANYL CITRATE (PF) 250 MCG/5ML IJ SOLN
INTRAMUSCULAR | Status: DC | PRN
  Administered 2016-01-17 (×2): 50 ug via INTRAVENOUS

## 2016-01-17 MED ORDER — ZOLPIDEM TARTRATE 5 MG PO TABS: 5.0000 mg | ORAL_TABLET | Freq: Every evening | ORAL | Status: DC | PRN

## 2016-01-17 MED ORDER — LIDOCAINE 2% (20 MG/ML) 5 ML SYRINGE
INTRAMUSCULAR | Status: AC
  Filled 2016-01-17: qty 5

## 2016-01-17 MED ORDER — ONDANSETRON HCL 4 MG/2ML IJ SOLN
4.0000 mg | Freq: Once | INTRAMUSCULAR | Status: AC
  Administered 2016-01-17: 4 mg via INTRAVENOUS
  Filled 2016-01-17: qty 2

## 2016-01-17 MED ORDER — ROCURONIUM BROMIDE 10 MG/ML (PF) SYRINGE
PREFILLED_SYRINGE | INTRAVENOUS | Status: AC
  Filled 2016-01-17: qty 10

## 2016-01-17 MED ORDER — CEFAZOLIN IN D5W 1 GM/50ML IV SOLN
1.0000 g | Freq: Once | INTRAVENOUS | Status: AC
  Administered 2016-01-17: 1 g via INTRAVENOUS
  Filled 2016-01-17: qty 50

## 2016-01-17 MED ORDER — CHLORHEXIDINE GLUCONATE 4 % EX LIQD
60.0000 mL | Freq: Once | CUTANEOUS | Status: DC
Start: 2016-01-17 — End: 2016-01-17

## 2016-01-17 MED ORDER — ONDANSETRON HCL 4 MG/2ML IJ SOLN
INTRAMUSCULAR | Status: AC
  Filled 2016-01-17: qty 2

## 2016-01-17 MED ORDER — FLEET ENEMA 7-19 GM/118ML RE ENEM: 1.0000 | ENEMA | Freq: Once | RECTAL | Status: DC | PRN

## 2016-01-17 MED ORDER — PROPOFOL 10 MG/ML IV BOLUS
INTRAVENOUS | Status: AC
  Filled 2016-01-17: qty 40

## 2016-01-17 MED ORDER — SUGAMMADEX SODIUM 500 MG/5ML IV SOLN
INTRAVENOUS | Status: AC
  Filled 2016-01-17: qty 5

## 2016-01-17 MED ORDER — CEFAZOLIN SODIUM 1 G IJ SOLR
INTRAMUSCULAR | Status: AC
  Filled 2016-01-17: qty 10

## 2016-01-17 MED ORDER — SODIUM CHLORIDE 0.9 % IV SOLN
INTRAVENOUS | Status: DC
  Administered 2016-01-18: via INTRAVENOUS

## 2016-01-17 MED ORDER — BISACODYL 5 MG PO TBEC: 5.0000 mg | DELAYED_RELEASE_TABLET | Freq: Every day | ORAL | Status: DC | PRN

## 2016-01-17 MED ORDER — DIPHENHYDRAMINE HCL 12.5 MG/5ML PO ELIX: 12.5000 mg | ORAL_SOLUTION | ORAL | Status: DC | PRN

## 2016-01-17 MED ORDER — PROMETHAZINE HCL 25 MG/ML IJ SOLN: 6.2500 mg | INTRAMUSCULAR | Status: DC | PRN

## 2016-01-17 MED ORDER — POVIDONE-IODINE 10 % EX SWAB: 2.0000 "application " | Freq: Once | CUTANEOUS | Status: DC

## 2016-01-17 MED ORDER — ONDANSETRON HCL 4 MG/2ML IJ SOLN
INTRAMUSCULAR | Status: DC | PRN
  Administered 2016-01-17: 4 mg via INTRAVENOUS

## 2016-01-17 MED ORDER — OXYCODONE HCL 5 MG PO TABS
5.0000 mg | ORAL_TABLET | ORAL | Status: DC | PRN
  Filled 2016-01-17: qty 2

## 2016-01-17 MED ORDER — PHENYLEPHRINE 40 MCG/ML (10ML) SYRINGE FOR IV PUSH (FOR BLOOD PRESSURE SUPPORT)
PREFILLED_SYRINGE | INTRAVENOUS | Status: AC
  Filled 2016-01-17: qty 10

## 2016-01-17 MED ORDER — SENNOSIDES-DOCUSATE SODIUM 8.6-50 MG PO TABS: 1.0000 | ORAL_TABLET | Freq: Every evening | ORAL | Status: DC | PRN

## 2016-01-17 MED ORDER — CEFAZOLIN SODIUM-DEXTROSE 2-4 GM/100ML-% IV SOLN
2.0000 g | Freq: Three times a day (TID) | INTRAVENOUS | Status: DC
  Administered 2016-01-18: 2 g via INTRAVENOUS
  Filled 2016-01-17 (×2): qty 100

## 2016-01-17 MED ORDER — FENTANYL CITRATE (PF) 100 MCG/2ML IJ SOLN
25.0000 ug | INTRAMUSCULAR | Status: DC | PRN
  Administered 2016-01-17: 50 ug via INTRAVENOUS

## 2016-01-17 MED ORDER — ACETAMINOPHEN 500 MG PO TABS
1000.0000 mg | ORAL_TABLET | Freq: Four times a day (QID) | ORAL | Status: DC
  Administered 2016-01-17 – 2016-01-18 (×2): 1000 mg via ORAL
  Filled 2016-01-17 (×2): qty 2

## 2016-01-17 MED ORDER — LIDOCAINE-EPINEPHRINE (PF) 2 %-1:200000 IJ SOLN
20.0000 mL | Freq: Once | INTRAMUSCULAR | Status: AC
  Administered 2016-01-17: 20 mL via INTRADERMAL
  Filled 2016-01-17: qty 20

## 2016-01-17 MED ORDER — ACETAMINOPHEN 325 MG PO TABS
ORAL_TABLET | ORAL | Status: AC
  Filled 2016-01-17: qty 2

## 2016-01-17 MED ORDER — HYDROMORPHONE HCL 1 MG/ML IJ SOLN: 1.0000 mg | INTRAMUSCULAR | Status: DC | PRN

## 2016-01-17 MED ORDER — CEFAZOLIN SODIUM-DEXTROSE 2-4 GM/100ML-% IV SOLN: 2.0000 g | INTRAVENOUS | Status: DC

## 2016-01-17 MED ORDER — ARTIFICIAL TEARS OP OINT
TOPICAL_OINTMENT | OPHTHALMIC | Status: AC
  Filled 2016-01-17: qty 3.5

## 2016-01-17 MED ORDER — DOCUSATE SODIUM 100 MG PO CAPS
100.0000 mg | ORAL_CAPSULE | Freq: Two times a day (BID) | ORAL | Status: DC
  Administered 2016-01-18: 100 mg via ORAL
  Filled 2016-01-17 (×2): qty 1

## 2016-01-17 MED ORDER — EPHEDRINE 5 MG/ML INJ
INTRAVENOUS | Status: AC
  Filled 2016-01-17: qty 10

## 2016-01-17 MED ORDER — ACETAMINOPHEN 325 MG PO TABS
650.0000 mg | ORAL_TABLET | Freq: Four times a day (QID) | ORAL | Status: DC | PRN
  Administered 2016-01-17: 650 mg via ORAL

## 2016-01-17 MED ORDER — LACTATED RINGERS IV SOLN
INTRAVENOUS | Status: DC | PRN
  Administered 2016-01-17: 19:00:00 via INTRAVENOUS

## 2016-01-17 MED ORDER — SUGAMMADEX SODIUM 500 MG/5ML IV SOLN
INTRAVENOUS | Status: DC | PRN
  Administered 2016-01-17: 400 mg via INTRAVENOUS

## 2016-01-17 MED ORDER — ONDANSETRON HCL 4 MG/2ML IJ SOLN: 4.0000 mg | Freq: Four times a day (QID) | INTRAMUSCULAR | Status: DC | PRN

## 2016-01-17 MED ORDER — PROPOFOL 10 MG/ML IV BOLUS
INTRAVENOUS | Status: DC | PRN
  Administered 2016-01-17: 50 mg via INTRAVENOUS
  Administered 2016-01-17: 120 mg via INTRAVENOUS

## 2016-01-17 SURGICAL SUPPLY — 35 items
BANDAGE ELASTIC 4 VELCRO ST LF (GAUZE/BANDAGES/DRESSINGS) ×2 IMPLANT
BNDG GAUZE ELAST 4 BULKY (GAUZE/BANDAGES/DRESSINGS) ×2 IMPLANT
COVER SURGICAL LIGHT HANDLE (MISCELLANEOUS) ×3 IMPLANT
DRAPE EXTREMITY T 121X128X90 (DRAPE) ×3 IMPLANT
DRAPE PROXIMA HALF (DRAPES) ×3 IMPLANT
DRAPE U-SHAPE 47X51 STRL (DRAPES) ×3 IMPLANT
DRSG PAD ABDOMINAL 8X10 ST (GAUZE/BANDAGES/DRESSINGS) ×3 IMPLANT
ELECT CAUTERY BLADE 6.4 (BLADE) ×2 IMPLANT
ELECT REM PT RETURN 9FT ADLT (ELECTROSURGICAL) ×3
ELECTRODE REM PT RTRN 9FT ADLT (ELECTROSURGICAL) ×1 IMPLANT
GAUZE SPONGE 4X4 12PLY STRL (GAUZE/BANDAGES/DRESSINGS) ×3 IMPLANT
GAUZE XEROFORM 5X9 LF (GAUZE/BANDAGES/DRESSINGS) ×2 IMPLANT
GLOVE BIOGEL PI IND STRL 8 (GLOVE) IMPLANT
GLOVE BIOGEL PI IND STRL 8.5 (GLOVE) ×5 IMPLANT
GLOVE BIOGEL PI INDICATOR 8 (GLOVE) ×2
GLOVE BIOGEL PI INDICATOR 8.5 (GLOVE) ×10
GLOVE SURG ORTHO 8.0 STRL STRW (GLOVE) ×18 IMPLANT
GOWN STRL REUS W/ TWL LRG LVL3 (GOWN DISPOSABLE) ×2 IMPLANT
GOWN STRL REUS W/ TWL XL LVL3 (GOWN DISPOSABLE) ×2 IMPLANT
GOWN STRL REUS W/TWL 2XL LVL3 (GOWN DISPOSABLE) ×3 IMPLANT
GOWN STRL REUS W/TWL LRG LVL3 (GOWN DISPOSABLE) ×6
GOWN STRL REUS W/TWL XL LVL3 (GOWN DISPOSABLE) ×3
HANDPIECE INTERPULSE COAX TIP (DISPOSABLE) ×3
KIT BASIN OR (CUSTOM PROCEDURE TRAY) ×3 IMPLANT
KIT ROOM TURNOVER OR (KITS) ×3 IMPLANT
MANIFOLD NEPTUNE II (INSTRUMENTS) ×3 IMPLANT
NS IRRIG 1000ML POUR BTL (IV SOLUTION) ×3 IMPLANT
PACK GENERAL/GYN (CUSTOM PROCEDURE TRAY) ×3 IMPLANT
PAD ARMBOARD 7.5X6 YLW CONV (MISCELLANEOUS) ×6 IMPLANT
SET HNDPC FAN SPRY TIP SCT (DISPOSABLE) ×1 IMPLANT
SPONGE GAUZE 4X4 12PLY STER LF (GAUZE/BANDAGES/DRESSINGS) ×2 IMPLANT
SUT ETHILON 3 0 PS 1 (SUTURE) ×2 IMPLANT
TOWEL OR 17X24 6PK STRL BLUE (TOWEL DISPOSABLE) ×3 IMPLANT
TOWEL OR 17X26 10 PK STRL BLUE (TOWEL DISPOSABLE) ×3 IMPLANT
TRAY FOLEY CATH 16FRSI W/METER (SET/KITS/TRAYS/PACK) IMPLANT

## 2016-01-17 NOTE — H&P (Signed)
NAME: Antonio Manning MRN:   [id number] DOB:   02/05/1941   CHIEF COMPLAINT:  Painful right leg  HISTORY:   Antonio Manning a 75 y.o. male  with right  Leg pain. Patient was unloading lawnmower from back of truck when mower fell on leg.  Plan for I/D and complex closure of laceration  PAST MEDICAL HISTORY:   Past Medical History:  Diagnosis Date  . Arthritis 06/11/2011   osteoarthritis,lt. hip, and neck area  . Cancer ([facility]) 06-11-11   skin cancer lesions-multiple  . Cataract 06/11/11   right  . Cholesterol serum elevated 2011-06-11   tx. Lipitor  . Expressive aphasia 10/2015  . Hearing loss 06-11-2011   wears hearing aids bilaterally  . Kidney calculi 11-Jun-2011   past hx. x1-passed on own, mild prostate issues-no meds  . PONV (postoperative nausea and vomiting) 06/11/2011   with cataract surgery  . Stroke [facility]) 10/2015    PAST SURGICAL HISTORY:   Past Surgical History:  Procedure Laterality Date  . BACK SURGERY  Jun 11, 2011   Lumbar disc surgery-no hardware  . CATARACT EXTRACTION W/ INTRAOCULAR LENS IMPLANT  June 11, 2011   left eye  . EP IMPLANTABLE DEVICE N/A 10/29/2015   Procedure: Loop Recorder Insertion;  Surgeon: Marinus Maw, MD;  Location: [facility] INVASIVE CV LAB;  Service: Cardiovascular;  Laterality: N/A;  . EYE SURGERY     eye lid lift   . TEE WITHOUT CARDIOVERSION N/A 10/29/2015   Procedure: TRANSESOPHAGEAL ECHOCARDIOGRAM (TEE);  Surgeon: Laurey Morale, MD;  Location: [facility] ENDOSCOPY;  Service: Cardiovascular;  Laterality: N/A;  . TOTAL HIP ARTHROPLASTY  05/20/2011   Procedure: TOTAL HIP ARTHROPLASTY ANTERIOR APPROACH;  Surgeon: [name], MD;  Location: [facility] ORS;  Service: Orthopedics;  Laterality: Left;    MEDICATIONS:   (Not in a hospital admission)  ALLERGIES:   Allergies  Allergen Reactions  . Codeine Nausea And Vomiting    REVIEW OF SYSTEMS:   Negative except right leg pain  FAMILY HISTORY:   Family History  Problem Relation Age of Onset  . Heart Problems Mother   . Cancer  Father   . Cancer Brother   . Stroke Brother   . Heart Problems Brother   . Cancer Brother   . Heart Problems Brother   . Heart Problems Sister   . Hyperlipidemia Sister   . Birth defects Child   . Other Child     one kidney    SOCIAL HISTORY:   reports that he quit smoking about 27 years ago. His smoking use included Cigarettes. He quit after 25.00 years of use. He has never used smokeless tobacco. He reports that he does not drink alcohol or use drugs.  PHYSICAL EXAM:  General appearance: alert, cooperative and no distress Extremities: large full thickness laceration to right lower leg    LABORATORY STUDIES:  Recent Labs  01/17/16 1324  WBC 5.8  HGB 12.4*  HCT 38.4*  PLT 237    No results for input(s): NA, K, CL, CO2, GLUCOSE, BUN, CREATININE, CALCIUM in the last 72 hours.  STUDIES/RESULTS:  Dg Tibia/fibula Right  Result Date: 01/17/2016 CLINICAL DATA:  Patient unloading lawn mower which fell off the truck and landed on lower extremity. Right lower extremity laceration. Initial encounter. EXAM: RIGHT TIBIA AND FIBULA - 2 VIEW COMPARISON:  None. FINDINGS: Normal anatomic alignment. No evidence for acute fracture or dislocation. Ossific density within the soft tissues overlying the posterior mid tibial diaphysis. Soft tissue laceration along the  posterior distal lower extremity. IMPRESSION: Soft tissue deformity about the posterior distal lower extremity. No acute osseous abnormality. Calcific density within the soft tissues overlying the posterior mid tibial diaphysis, nonspecific. Recommend correlation to exclude the possibility of foreign body. Electronically Signed   By: Annia Belt M.D.   On: 01/17/2016 14:28    ASSESSMENT: right leg laceration  PLAN: will take to the OR for a I/D of the right leg with a complex closure.    Jazalynn Mireles,STEPHEN D 01/17/2016. 4:07 PM

## 2016-01-17 NOTE — Anesthesia Preprocedure Evaluation (Addendum)
Anesthesia Evaluation  Patient identified by MRN, date of birth, ID band Patient awake    Reviewed: Allergy & Precautions, NPO status , Patient's Chart, lab work & pertinent test results  Airway Mallampati: II  TM Distance: >3 FB Neck ROM: Full    Dental no notable dental hx. (+) Edentulous Upper, Partial Lower, Dental Advisory Given   Pulmonary neg pulmonary ROS, former smoker,    Pulmonary exam normal breath sounds clear to auscultation       Cardiovascular + Peripheral Vascular Disease  Normal cardiovascular exam+ dysrhythmias Atrial Fibrillation  Rhythm:Regular Rate:Normal  Left ventricle: The cavity size was normal. Wall thickness was   increased in a pattern of mild LVH. The estimated ejection   fraction was 55%. Wall motion was normal; there were no regional   wall motion abnormalities. - Aortic valve: There was no stenosis. There was trivial   regurgitation. - Aorta: The aortic root was dilated to 5.0 cm and the ascending   aorta measured up to 4.6 cm. - Mitral valve: There was mild regurgitation. - Left atrium: No evidence of thrombus in the atrial cavity or   appendage. - Right ventricle: The cavity size was normal. There was no   evidence of concentric hypertrophy. - Right atrium: No evidence of thrombus in the atrial cavity or   appendage. - Atrial septum: PFO was noted by bubble study. 10/2015   Neuro/Psych CVA, Residual Symptoms negative psych ROS   GI/Hepatic negative GI ROS, Neg liver ROS,   Endo/Other  negative endocrine ROS  Renal/GU negative Renal ROS  negative genitourinary   Musculoskeletal negative musculoskeletal ROS (+)   Abdominal   Peds negative pediatric ROS (+)  Hematology negative hematology ROS (+)   Anesthesia Other Findings   Reproductive/Obstetrics negative OB ROS                           Anesthesia Physical Anesthesia Plan  ASA: III  Anesthesia  Plan: General   Post-op Pain Management:    Induction: Intravenous  Airway Management Planned: Oral ETT  Additional Equipment:   Intra-op Plan:   Post-operative Plan: Extubation in OR  Informed Consent: I have reviewed the patients History and Physical, chart, labs and discussed the procedure including the risks, benefits and alternatives for the proposed anesthesia with the patient or authorized representative who has indicated his/her understanding and acceptance.   Dental advisory given  Plan Discussed with: CRNA, Surgeon and Anesthesiologist  Anesthesia Plan Comments:        Anesthesia Quick Evaluation

## 2016-01-17 NOTE — Transfer of Care (Signed)
Immediate Anesthesia Transfer of Care Note  Patient: Antonio Manning  Procedure(s) Performed: Procedure(s): IRRIGATION AND DEBRIDEMENT LEG LACERATION (Right)  Patient Location: PACU  Anesthesia Type:General  Level of Consciousness: awake  Airway & Oxygen Therapy: Patient Spontanous Breathing  Post-op Assessment: Report given to RN and Post -op Vital signs reviewed and stable  Post vital signs: Reviewed and stable  Last Vitals:  Vitals:   01/17/16 1800 01/17/16 1815  BP: 130/80 111/90  Pulse:    Resp: 19 14  Temp:      Last Pain:  Vitals:   01/17/16 1701  TempSrc:   PainSc: 3          Complications: No apparent anesthesia complications

## 2016-01-17 NOTE — ED Provider Notes (Signed)
[facility] DEPT Provider Note   CSN: [id number] Arrival date & time: 01/17/16  1249     History   Chief Complaint Chief Complaint  Patient presents with  . Extremity Laceration    HPI JAYLEON KOTZ is a 75 y.o. male.  HPI   75 year old male with past medical history of CVA, hyperlipidemia, who presents with mechanical fall. 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 He tripped and got his right leg stuck between the trailers. He then fell down to the ground. Denies any head trauma. He had immediate onset of severe sharp right leg pain that was worse with any movement. He had to lift one of the tractors up to remove his leg and then noticed a significant wound to his right anterior shin. He had moderate bleeding at the time and is on blood thinners. Currently he endorses mild numbness along the medial instep of his foot.  Past Medical History:  Diagnosis Date  . Arthritis 05/30/2011   osteoarthritis,lt. hip, and neck area  . Cancer ([place]) May 30, 2011   skin cancer lesions-multiple  . Cataract 05-30-2011   right  . Cholesterol serum elevated 2011-05-30   tx. Lipitor  . Expressive aphasia 10/2015  . Hearing loss 2011/05/30   wears hearing aids bilaterally  . Kidney calculi 05-30-11   past hx. x1-passed on own, mild prostate issues-no meds  . PONV (postoperative nausea and vomiting) 05/30/2011   with cataract surgery  . Stroke [facility]) 10/2015    Patient Active Problem List   Diagnosis Date Noted  . Paroxysmal atrial flutter ([place]) 11/25/2015  . Expressive aphasia 10/27/2015  . Cerebral thrombosis with cerebral infarction 10/27/2015  . Aphasia 10/27/2015  . Acute CVA (cerebrovascular accident) ([place]) 10/27/2015  . Thoracic aorta atherosclerosis ([place]) 04/04/2014  . Hyperlipidemia 04/04/2014  . Fatigue 10/30/2013  . Bradycardia 10/30/2013  . S/P left hip replacement 05/21/2011    Past Surgical History:  Procedure Laterality Date  . BACK  SURGERY  05/30/2011   Lumbar disc surgery-no hardware  . CATARACT EXTRACTION W/ INTRAOCULAR LENS IMPLANT  05/30/11   left eye  . EP IMPLANTABLE DEVICE N/A 10/29/2015   Procedure: Loop Recorder Insertion;  Surgeon: Evans Lance, MD;  Location: [facility];  Service: Cardiovascular;  Laterality: N/A;  . EYE SURGERY     eye lid lift   . TEE WITHOUT CARDIOVERSION N/A 10/29/2015   Procedure: TRANSESOPHAGEAL ECHOCARDIOGRAM (TEE);  Surgeon: Larey Dresser, MD;  Location: [facility];  Service: Cardiovascular;  Laterality: N/A;  . TOTAL HIP ARTHROPLASTY  05/20/2011   Procedure: TOTAL HIP ARTHROPLASTY ANTERIOR APPROACH;  Surgeon: Mauri Pole, MD;  Location: [facility];  Service: Orthopedics;  Laterality: Left;       Home Medications    Prior to Admission medications   Medication Sig Start Date End Date Taking? Authorizing Provider  acetaminophen (TYLENOL) 500 MG tablet Take 1,000 mg by mouth every 6 (six) hours as needed (pain).   Yes Historical Provider, MD  apixaban (ELIQUIS) 5 MG TABS tablet Take 1 tablet (5 mg total) by mouth 2 (two) times daily. 11/27/15  Yes Evans Lance, MD  atorvastatin (LIPITOR) 20 MG tablet Take 20 mg by mouth at bedtime.  11/06/15  Yes Historical Provider, MD  Calcium Carbonate-Vitamin D (CALCIUM + D PO) Take 1 tablet by mouth at bedtime.    Yes Historical Provider, MD  GLUCOSAMINE-CHONDROITIN PO Take 1 tablet by mouth at bedtime. Triple  strength    Yes Historical Provider, MD  Multiple Vitamin (MULITIVITAMIN WITH MINERALS) TABS Take 1 tablet by mouth at bedtime.    Yes Historical Provider, MD    Family History Family History  Problem Relation Age of Onset  . Heart Problems Mother   . Cancer Father   . Cancer Brother   . Stroke Brother   . Heart Problems Brother   . Cancer Brother   . Heart Problems Brother   . Heart Problems Sister   . Hyperlipidemia Sister   . Birth defects Child   . Other Child     one kidney    Social History Social History    Substance Use Topics  . Smoking status: Former Smoker    Years: 25.00    Types: Cigarettes    Quit date: 05/12/1988  . Smokeless tobacco: Never Used  . Alcohol use No     Allergies   Codeine   Review of Systems Review of Systems  Constitutional: Negative for chills, fatigue and fever.  HENT: Negative for congestion and rhinorrhea.   Eyes: Negative for visual disturbance.  Respiratory: Negative for cough, shortness of breath and wheezing.   Cardiovascular: Negative for chest pain and leg swelling.  Gastrointestinal: Negative for abdominal pain, diarrhea, nausea and vomiting.  Genitourinary: Negative for dysuria and flank pain.  Musculoskeletal: Positive for gait problem. Negative for neck pain and neck stiffness.  Skin: Positive for wound. Negative for rash.  Allergic/Immunologic: Negative for immunocompromised state.  Neurological: Negative for syncope, weakness and headaches.  All other systems reviewed and are negative.    Physical Exam Updated Vital Signs BP 111/90   Pulse 91   Temp 97.9 F (36.6 C) (Oral)   Resp 14   Ht 5\' 10"  (1.778 m)   Wt 160 lb (72.6 kg)   SpO2 96%   BMI 22.96 kg/m   Physical Exam  Constitutional: He is oriented to person, place, and time. He appears well-developed and well-nourished. No distress.  HENT:  Head: Normocephalic and atraumatic.  Eyes: Conjunctivae are normal.  Neck: Neck supple.  Cardiovascular: Normal rate, regular rhythm and normal heart sounds.  Exam reveals no friction rub.   No murmur heard. Pulmonary/Chest: Effort normal and breath sounds normal. No respiratory distress. He has no wheezes. He has no rales.  Abdominal: He exhibits no distension.  Musculoskeletal: He exhibits no edema.  Neurological: He is alert and oriented to person, place, and time. He exhibits normal muscle tone.  Skin: Skin is warm. Capillary refill takes less than 2 seconds.  Psychiatric: He has a normal mood and affect.  Nursing note and  vitals reviewed.    UPPER EXTREMITY EXAM: RIGHT  INSPECTION & PALPATION: Large, curvilinear laceration to anterior, medial shin. Exposed, lacerated muscle body along distal lower leg. Exposed, shaggy anterior tibialis tendon.   SENSORY: Sensation is intact to light touch in:  Superficial radial nerve distribution (dorsal first web space) Median nerve distribution (tip of index finger)   Ulnar nerve distribution (tip of small finger)     MOTOR:  + Motor posterior interosseous nerve (thumb IP extension) + Anterior interosseous nerve (thumb IP flexion, index finger DIP flexion) + Radial nerve (wrist extension) + Median nerve (palpable firing thenar mass) + Ulnar nerve (palpable firing of first dorsal interosseous muscle)  VASCULAR: 2+ radial pulse Brisk capillary refill < 2 sec, fingers warm and well-perfused  TENDONS: Tested individual flexion at MCP, PIP and DIP joints of index, long, ring and small fingers  and all are intact. Tested flexion at thumb MCP and IP joint and both intact. Tested extension of DIP/PIP joints of index, long, ring and small fingers and all intact. Tested extension and abduction of thumb and intact. Tested individual extension of index finger and small finger and both intact individually.  COMPARTMENTS: Soft, warm, well-perfused No pain with passive extension No paresthesias   ED Treatments / Results  Labs (all labs ordered are listed, but only abnormal results are displayed) Labs Reviewed  CBC - Abnormal; Notable for the following:       Result Value   RBC 4.17 (*)    Hemoglobin 12.4 (*)    HCT 38.4 (*)    All other components within normal limits  PROTIME-INR - Abnormal; Notable for the following:    Prothrombin Time 16.0 (*)    All other components within normal limits  I-STAT CHEM 8, ED - Abnormal; Notable for the following:    BUN 30 (*)    Glucose, Bld 126 (*)    All other components within normal limits  URINALYSIS, ROUTINE W REFLEX  MICROSCOPIC (NOT AT [facility])    EKG  EKG Interpretation  Date/Time:  Saturday January 17 2016 17:23:36 EDT Ventricular Rate:  95 PR Interval:    QRS Duration: 98 QT Interval:  375 QTC Calculation: 472 R Axis:   -85 Text Interpretation:  Sinus rhythm Multiple ventricular premature complexes Prolonged PR interval Abnormal R-wave progression, late transition Inferior infarct, old No significant change since last tracing Confirmed by Jeannette Maddy MD, Kensey Luepke [phone]) on 01/17/2016 6:22:18 PM       Radiology Dg Chest 2 View  Result Date: 01/17/2016 CLINICAL DATA:  Altered mental status.  History of AFib. EXAM: CHEST  2 VIEW COMPARISON:  04/15/2014 FINDINGS: Loop recorder over the left chest wall. Lungs are adequately inflated without consolidation or effusion. Flattening of the hemidiaphragms. Cardiomediastinal silhouette is within normal. There mild degenerate changes of the spine. IMPRESSION: No active cardiopulmonary disease. Electronically Signed   By: Marin Olp M.D.   On: 01/17/2016 16:55   Dg Tibia/fibula Right  Result Date: 01/17/2016 CLINICAL DATA:  Patient unloading lawn mower which fell off the truck and landed on lower extremity. Right lower extremity laceration. Initial encounter. EXAM: RIGHT TIBIA AND FIBULA - 2 VIEW COMPARISON:  None. FINDINGS: Normal anatomic alignment. No evidence for acute fracture or dislocation. Ossific density within the soft tissues overlying the posterior mid tibial diaphysis. Soft tissue laceration along the posterior distal lower extremity. IMPRESSION: Soft tissue deformity about the posterior distal lower extremity. No acute osseous abnormality. Calcific density within the soft tissues overlying the posterior mid tibial diaphysis, nonspecific. Recommend correlation to exclude the possibility of foreign body. Electronically Signed   By: Lovey Newcomer M.D.   On: 01/17/2016 14:28   Ct Head Wo Contrast  Result Date: 01/17/2016 CLINICAL DATA:  Patient status post  fall. Unsure if trauma to the calvarium. EXAM: CT HEAD WITHOUT CONTRAST TECHNIQUE: Contiguous axial images were obtained from the base of the skull through the vertex without intravenous contrast. COMPARISON:  None. FINDINGS: Brain: Ventricles and sulci are appropriate for patient's age. Periventricular and subcortical white matter hypodensity compatible with chronic microvascular ischemic changes. Vascular: No hyperdense vessel or unexpected calcification. Skull: Normal. Negative for fracture or focal lesion. Sinuses/Orbits: Paranasal sinuses are well aerated. Mastoid air cells are unremarkable. Other: None. IMPRESSION: No acute intracranial process. Chronic microvascular ischemic changes. Electronically Signed   By: Lovey Newcomer M.D.   On: 01/17/2016  16:46    Procedures Procedures (including critical care time)  Medications Ordered in ED Medications  ceFAZolin (ANCEF) IVPB 1 g/50 mL premix (0 g Intravenous Stopped 01/17/16 1352)  fentaNYL (SUBLIMAZE) injection 50 mcg (50 mcg Intravenous Given 01/17/16 1322)  lidocaine-EPINEPHrine (XYLOCAINE W/EPI) 2 %-1:200000 (PF) injection 20 mL (20 mLs Intradermal Given 01/17/16 1327)  ondansetron (ZOFRAN) injection 4 mg (4 mg Intravenous Given 01/17/16 1322)  Tdap (BOOSTRIX) injection 0.5 mL (0.5 mLs Intramuscular Given 01/17/16 1557)     Initial Impression / Assessment and Plan / ED Course  I have reviewed the triage vital signs and the nursing notes.  Pertinent labs & imaging results that were available during my care of the patient were reviewed by me and considered in my medical decision making (see chart for details).  75 year old male with past medical history of hyperlipidemia and CVA who presents with large laceration to the right lower leg after mechanical fall. Plain films show no fracture. Examination shows exposed anterior tibialis tendon, as well as extensor retinaculum injury with exposed, lacerated muscle body. The wound was irrigated extensively  and wrapped with sterile dressing. Will consult orthopedics. Given concern for tendon and muscle body injury. No evidence of vascular injury. Otherwise, will obtain CT head as patient has had multiple falls in the last week, as well as screening medical labs. Patient is in agreement with this plan.  CT head negative. Labwork is unremarkable. Orthopedics has evaluated and will take to the operating room for closure. Patient otherwise remained hemodynamically stable and is cleared for disposition per orthopedics following OR.  Final Clinical Impressions(s) / ED Diagnoses   Final diagnoses:  Laceration  Fall, initial encounter    New Prescriptions New Prescriptions   No medications on file     Duffy Bruce, MD 01/17/16 1831

## 2016-01-17 NOTE — Anesthesia Postprocedure Evaluation (Signed)
Anesthesia Post Note  Patient: Antonio Manning  Procedure(s) Performed: Procedure(s) (LRB): IRRIGATION AND DEBRIDEMENT LEG LACERATION (Right)  Patient location during evaluation: PACU Anesthesia Type: General Level of consciousness: awake and alert Pain management: pain level controlled Vital Signs Assessment: post-procedure vital signs reviewed and stable Respiratory status: spontaneous breathing, nonlabored ventilation, respiratory function stable and patient connected to nasal cannula oxygen Cardiovascular status: blood pressure returned to baseline and stable Postop Assessment: no signs of nausea or vomiting Anesthetic complications: no    Last Vitals:  Vitals:   01/17/16 2009 01/17/16 2015  BP: 109/77 132/80  Pulse: 71 69  Resp: 18 17  Temp: 36.8 C     Last Pain:  Vitals:   01/17/16 2009  TempSrc:   PainSc: 4                  Alianah Lofton S

## 2016-01-17 NOTE — Anesthesia Procedure Notes (Signed)
Procedure Name: Intubation Date/Time: 01/17/2016 7:42 PM Performed by: Maude Leriche D Pre-anesthesia Checklist: Patient identified, Emergency Drugs available, Suction available, Patient being monitored and Timeout performed Patient Re-evaluated:Patient Re-evaluated prior to inductionOxygen Delivery Method: Circle system utilized Preoxygenation: Pre-oxygenation with 100% oxygen Intubation Type: IV induction and Cricoid Pressure applied Ventilation: Mask ventilation without difficulty Laryngoscope Size: Miller and 2 Grade View: Grade I Tube type: Oral Tube size: 7.5 mm Number of attempts: 1 Airway Equipment and Method: Stylet Placement Confirmation: ETT inserted through vocal cords under direct vision,  positive ETCO2 and breath sounds checked- equal and bilateral Secured at: 22 cm Tube secured with: Tape Dental Injury: Teeth and Oropharynx as per pre-operative assessment

## 2016-01-17 NOTE — ED Triage Notes (Signed)
Pt was unloading lawnmower from back of truck when mower fell on pt right lower leg.  Pt has full thickness laceration to right lower leg.  Pedal pulses intact.

## 2016-01-18 DIAGNOSIS — S81811A Laceration without foreign body, right lower leg, initial encounter: Secondary | ICD-10-CM | POA: Diagnosis not present

## 2016-01-18 MED ORDER — TRAMADOL HCL 50 MG PO TABS: 50.0000 mg | ORAL_TABLET | Freq: Four times a day (QID) | ORAL | 0 refills | Status: DC | PRN

## 2016-01-18 MED ORDER — CLINDAMYCIN HCL 300 MG PO CAPS: 300.0000 mg | ORAL_CAPSULE | Freq: Three times a day (TID) | ORAL | 0 refills | Status: DC

## 2016-01-18 NOTE — Plan of Care (Signed)
Problem: Pain Managment: Goal: General experience of comfort will improve Outcome: Progressing Refused narcotics for pain

## 2016-01-18 NOTE — Discharge Summary (Signed)
SPORTS MEDICINE & JOINT REPLACEMENT   Antonio Mulch, MD   Antonio Shadow, PA-C Antonio Manning, Antonio Manning,   [address]                             [phone]  PATIENT ID: Antonio Manning        MRN:  [id number]          DOB/AGE: 77-Sep-1942 / 77 y.o.    DISCHARGE SUMMARY  ADMISSION DATE:    01/17/2016 DISCHARGE DATE:   01/18/2016   ADMISSION DIAGNOSIS: Laceration [T14.8] Fall, initial encounter [W19.XXXA]    DISCHARGE DIAGNOSIS:  laceration of leg    ADDITIONAL DIAGNOSIS: Active Problems:   S/P debridement  Past Medical History:  Diagnosis Date  . Arthritis 2011/05/16   osteoarthritis,lt. hip, and neck area  . Cancer ([place]) 05-16-2011   skin cancer lesions-multiple  . Cataract May 16, 2011   right  . Cholesterol serum elevated 2011/05/16   tx. Lipitor  . Expressive aphasia 10/2015  . Hearing loss 2011-05-16   wears hearing aids bilaterally  . Kidney calculi 05/16/11   past hx. x1-passed on own, mild prostate issues-no meds  . PONV (postoperative nausea and vomiting) May 16, 2011   with cataract surgery  . Stroke ([place]) 10/2015    PROCEDURE: Procedure(s): IRRIGATION AND DEBRIDEMENT LEG LACERATION on 01/17/2016  CONSULTS:    HISTORY:  See H&P in chart  HOSPITAL COURSE:  Antonio Manning is a 75 y.o. admitted on 01/17/2016 and found to have a diagnosis of laceration of leg.  After appropriate laboratory studies were obtained  they were taken to the operating room on 01/17/2016 and underwent Procedure(s): IRRIGATION AND DEBRIDEMENT LEG LACERATION.   They were given perioperative antibiotics:  Anti-infectives    Start     Dose/Rate Route Frequency Ordered Stop   01/18/16 0600  ceFAZolin (ANCEF) IVPB 2g/100 mL premix  Status:  Discontinued     2 g 200 mL/hr over 30 Minutes Intravenous On call to O.R. 01/17/16 2200 01/17/16 2201   01/18/16 0600  ceFAZolin (ANCEF) IVPB 2g/100 mL premix     2 g 200 mL/hr over 30 Minutes Intravenous Every 8 hours 01/17/16 2115 01/18/16 2159   01/18/16 0000   clindamycin (CLEOCIN) 300 MG capsule     300 mg Oral 3 times daily 01/18/16 0845     01/17/16 1315  ceFAZolin (ANCEF) IVPB 1 g/50 mL premix     1 g 100 mL/hr over 30 Minutes Intravenous  Once 01/17/16 1302 01/17/16 1352    .  Patient given tranexamic acid IV or topical and exparel intra-operatively.  Tolerated the procedure well.    POD# 1: Vital signs were stable.  Patient denied Chest pain, shortness of breath, or calf pain.  Patient was started on Lovenox 30 mg subcutaneously twice daily at 8am.  Consults to PT, OT, and care management were made.  The patient was weight bearing as tolerated.  CPM was placed on the operative leg 0-90 degrees for 6-8 hours a day. When out of the CPM, patient was placed in the foam block to achieve full extension. Incentive spirometry was taught.  Dressing was changed.       POD #2, Continued  PT for ambulation and exercise program.  IV saline locked.  O2 discontinued.    The remainder of the hospital course was dedicated to ambulation and strengthening.   The patient was discharged on 1 Day Post-Op in  Good  condition.  Blood products given:none  DIAGNOSTIC STUDIES: Recent vital signs: Patient Vitals for the past 24 hrs:  BP Temp Temp src Pulse Resp SpO2 Height Weight  01/18/16 0526 112/73 97.8 F (36.6 C) Oral (!) 57 16 96 % - -  01/18/16 0042 110/74 98.6 F (37 C) Oral 93 16 95 % - -  01/17/16 2111 120/79 98.6 F (37 C) - 87 18 99 % - -  01/17/16 2100 - 98.4 F (36.9 C) - 93 17 100 % - -  01/17/16 2059 135/90 - - 76 17 100 % - -  01/17/16 2045 139/80 - - 97 17 98 % - -  01/17/16 2030 - - - - 16 - - -  01/17/16 2015 132/80 - - 69 17 98 % - -  01/17/16 2009 109/77 98.3 F (36.8 C) - 71 18 98 % - -  01/17/16 1815 111/90 - - - 14 96 % - -  01/17/16 1800 130/80 - - - 19 95 % - -  01/17/16 1730 123/90 - - 91 19 94 % - -  01/17/16 1727 121/90 - - 98 18 94 % - -  01/17/16 1545 121/76 - - 92 (!) 27 97 % - -  01/17/16 1530 117/83 - - 97 20 100 %  - -  01/17/16 1515 134/99 - - 87 12 97 % - -  01/17/16 1430 121/74 - - (!) 56 18 99 % - -  01/17/16 1330 114/86 - - (!) 52 19 91 % - -  01/17/16 1301 - 97.9 F (36.6 C) Oral - - - - -  01/17/16 1300 118/81 - - (!) 52 13 95 % - -  01/17/16 1256 137/95 - - 66 19 96 % - -  01/17/16 1253 - - - - - - 5\' 10"  (1.778 m) 72.6 kg (160 lb)  01/17/16 1249 - - - - - 91 % - -       Recent laboratory studies:  Recent Labs  01/17/16 1324 01/17/16 1815 01/17/16 2057  WBC 5.8  --  3.9*  HGB 12.4* 13.6 12.2*  HCT 38.4* 40.0 37.0*  PLT 237  --  117*    Recent Labs  01/17/16 1815 01/17/16 2057  NA 143 143  K 4.2 4.3  CL 104 109  CO2  --  27  BUN 30* 24*  CREATININE 1.00 1.02  GLUCOSE 126* 91  CALCIUM  --  8.8*   Lab Results  Component Value Date   INR 1.28 01/17/2016   INR 1.06 10/27/2015   INR 0.96 05/13/2011     Recent Radiographic Studies :  Dg Chest 2 View  Result Date: 01/17/2016 CLINICAL DATA:  Altered mental status.  History of AFib. EXAM: CHEST  2 VIEW COMPARISON:  04/15/2014 FINDINGS: Loop recorder over the left chest wall. Lungs are adequately inflated without consolidation or effusion. Flattening of the hemidiaphragms. Cardiomediastinal silhouette is within normal. There mild degenerate changes of the spine. IMPRESSION: No active cardiopulmonary disease. Electronically Signed   By: Marin Olp M.D.   On: 01/17/2016 16:55   Dg Tibia/fibula Right  Result Date: 01/17/2016 CLINICAL DATA:  Patient unloading lawn mower which fell off the truck and landed on lower extremity. Right lower extremity laceration. Initial encounter. EXAM: RIGHT TIBIA AND FIBULA - 2 VIEW COMPARISON:  None. FINDINGS: Normal anatomic alignment. No evidence for acute fracture or dislocation. Ossific density within the soft tissues overlying the posterior mid tibial diaphysis. Soft tissue laceration along the  posterior distal lower extremity. IMPRESSION: Soft tissue deformity about the posterior distal lower  extremity. No acute osseous abnormality. Calcific density within the soft tissues overlying the posterior mid tibial diaphysis, nonspecific. Recommend correlation to exclude the possibility of foreign body. Electronically Signed   By: Lovey Newcomer M.D.   On: 01/17/2016 14:28   Ct Head Wo Contrast  Result Date: 01/17/2016 CLINICAL DATA:  Patient status post fall. Unsure if trauma to the calvarium. EXAM: CT HEAD WITHOUT CONTRAST TECHNIQUE: Contiguous axial images were obtained from the base of the skull through the vertex without intravenous contrast. COMPARISON:  None. FINDINGS: Brain: Ventricles and sulci are appropriate for patient's age. Periventricular and subcortical white matter hypodensity compatible with chronic microvascular ischemic changes. Vascular: No hyperdense vessel or unexpected calcification. Skull: Normal. Negative for fracture or focal lesion. Sinuses/Orbits: Paranasal sinuses are well aerated. Mastoid air cells are unremarkable. Other: None. IMPRESSION: No acute intracranial process. Chronic microvascular ischemic changes. Electronically Signed   By: Lovey Newcomer M.D.   On: 01/17/2016 16:46    DISCHARGE INSTRUCTIONS: Discharge Instructions    Call MD / Call 911    Complete by:  As directed    If you experience chest pain or shortness of breath, CALL 911 and be transported to the hospital emergency room.  If you develope a fever above 101 F, pus (white drainage) or increased drainage or redness at the wound, or calf pain, call your surgeon's office.   Constipation Prevention    Complete by:  As directed    Drink plenty of fluids.  Prune juice may be helpful.  You may use a stool softener, such as Colace (over the counter) 100 mg twice a day.  Use MiraLax (over the counter) for constipation as needed.   Diet - low sodium heart healthy    Complete by:  As directed    Discharge instructions    Complete by:  As directed    Keep wound clean/dry  Dressing change daily  May get wet  starting Tuesday  Follow up at office on Thursday for wound check   Increase activity slowly as tolerated    Complete by:  As directed       DISCHARGE MEDICATIONS:     Medication List    TAKE these medications   acetaminophen 500 MG tablet Commonly known as:  TYLENOL Take 1,000 mg by mouth every 6 (six) hours as needed (pain).   apixaban 5 MG Tabs tablet Commonly known as:  ELIQUIS Take 1 tablet (5 mg total) by mouth 2 (two) times daily.   atorvastatin 20 MG tablet Commonly known as:  LIPITOR Take 20 mg by mouth at bedtime.   CALCIUM + D PO Take 1 tablet by mouth at bedtime.   clindamycin 300 MG capsule Commonly known as:  CLEOCIN Take 1 capsule (300 mg total) by mouth 3 (three) times daily.   GLUCOSAMINE-CHONDROITIN PO Take 1 tablet by mouth at bedtime. Triple strength   multivitamin with minerals Tabs tablet Take 1 tablet by mouth at bedtime.   traMADol 50 MG tablet Commonly known as:  ULTRAM Take 1 tablet (50 mg total) by mouth every 6 (six) hours as needed.       FOLLOW UP VISIT:   Follow-up Information    Rudean Haskell, MD. Call on 01/22/2016.   Specialty:  Orthopedic Surgery Contact information: [address] [place] [address] [phone]           DISPOSITION: HOME VS. SNF  CONDITION:  Good   Donia Ast 01/18/2016, 8:45 AM

## 2016-01-18 NOTE — Progress Notes (Signed)
SPORTS MEDICINE AND JOINT REPLACEMENT  Lara Mulch, MD    Carlyon Shadow, PA-C [place], [place], [place]  [address]                             [phone]   PROGRESS NOTE  Subjective:  negative for Chest Pain  negative for Shortness of Breath  negative for Nausea/Vomiting   negative for Calf Pain  negative for Bowel Movement   Tolerating Diet: yes         Patient reports pain as 4 on 0-10 scale.    Objective: Vital signs in last 24 hours:   Patient Vitals for the past 24 hrs:  BP Temp Temp src Pulse Resp SpO2 Height Weight  01/18/16 0526 112/73 97.8 F (36.6 C) Oral (!) 57 16 96 % - -  01/18/16 0042 110/74 98.6 F (37 C) Oral 93 16 95 % - -  01/17/16 2111 120/79 98.6 F (37 C) - 87 18 99 % - -  01/17/16 2100 - 98.4 F (36.9 C) - 93 17 100 % - -  01/17/16 2059 135/90 - - 76 17 100 % - -  01/17/16 2045 139/80 - - 97 17 98 % - -  01/17/16 2030 - - - - 16 - - -  01/17/16 2015 132/80 - - 69 17 98 % - -  01/17/16 2009 109/77 98.3 F (36.8 C) - 71 18 98 % - -  01/17/16 1815 111/90 - - - 14 96 % - -  01/17/16 1800 130/80 - - - 19 95 % - -  01/17/16 1730 123/90 - - 91 19 94 % - -  01/17/16 1727 121/90 - - 98 18 94 % - -  01/17/16 1545 121/76 - - 92 (!) 27 97 % - -  01/17/16 1530 117/83 - - 97 20 100 % - -  01/17/16 1515 134/99 - - 87 12 97 % - -  01/17/16 1430 121/74 - - (!) 56 18 99 % - -  01/17/16 1330 114/86 - - (!) 52 19 91 % - -  01/17/16 1301 - 97.9 F (36.6 C) Oral - - - - -  01/17/16 1300 118/81 - - (!) 52 13 95 % - -  01/17/16 1256 137/95 - - 66 19 96 % - -  01/17/16 1253 - - - - - - 5\' 10"  (1.778 m) 72.6 kg (160 lb)  01/17/16 1249 - - - - - 91 % - -    @flow {1959:LAST@   Intake/Output from previous day:   09/16 0701 - 09/17 0700 In: 800 [I.V.:800] Out: 50    Intake/Output this shift:   No intake/output data recorded.   Intake/Output      09/16 0701 - 09/17 0700 09/17 0701 - 09/18 0700   I.V. (mL/kg) 800 (11)    Total Intake(mL/kg) 800  (11)    Urine (mL/kg/hr) 0    Blood 50    Total Output 50     Net +750          Urine Occurrence 500 x       LABORATORY DATA:  Recent Labs  01/17/16 1324 01/17/16 1815 01/17/16 2057  WBC 5.8  --  3.9*  HGB 12.4* 13.6 12.2*  HCT 38.4* 40.0 37.0*  PLT 237  --  117*    Recent Labs  01/17/16 1815 01/17/16 2057  NA 143 143  K 4.2 4.3  CL 104 109  CO2  --  27  BUN 30* 24*  CREATININE 1.00 1.02  GLUCOSE 126* 91  CALCIUM  --  8.8*   Lab Results  Component Value Date   INR 1.28 01/17/2016   INR 1.06 10/27/2015   INR 0.96 05/13/2011    Examination:  General appearance: alert, cooperative and no distress Extremities: extremities normal, atraumatic, no cyanosis or edema  Wound Exam: clean, dry, intact   Drainage:  None: wound tissue dry  Motor Exam: Quadriceps and Hamstrings Intact  Sensory Exam: Superficial Peroneal, Deep Peroneal and Tibial normal   Assessment:    1 Day Post-Op  Procedure(s) (LRB): IRRIGATION AND DEBRIDEMENT LEG LACERATION (Right)  ADDITIONAL DIAGNOSIS:  Active Problems:   S/P debridement  Acute Blood Loss Anemia   Plan: Physical Therapy as ordered Weight Bearing as Tolerated (WBAT)  DVT Prophylaxis:  None  DISCHARGE PLAN: Home  Patient doing well. Will D/C to home and follow up at the office for wound checks.     Donia Ast 01/18/2016, 8:41 AM

## 2016-01-19 ENCOUNTER — Encounter (HOSPITAL_COMMUNITY): Payer: Self-pay | Admitting: Orthopedic Surgery

## 2016-01-20 DIAGNOSIS — L821 Other seborrheic keratosis: Secondary | ICD-10-CM | POA: Diagnosis not present

## 2016-01-20 DIAGNOSIS — C44619 Basal cell carcinoma of skin of left upper limb, including shoulder: Secondary | ICD-10-CM | POA: Diagnosis not present

## 2016-01-20 DIAGNOSIS — L570 Actinic keratosis: Secondary | ICD-10-CM | POA: Diagnosis not present

## 2016-01-20 DIAGNOSIS — Z85828 Personal history of other malignant neoplasm of skin: Secondary | ICD-10-CM | POA: Diagnosis not present

## 2016-01-20 DIAGNOSIS — D1801 Hemangioma of skin and subcutaneous tissue: Secondary | ICD-10-CM | POA: Diagnosis not present

## 2016-01-20 DIAGNOSIS — D485 Neoplasm of uncertain behavior of skin: Secondary | ICD-10-CM | POA: Diagnosis not present

## 2016-01-20 NOTE — Addendum Note (Signed)
Addendum  created 01/20/16 1432 by Josephine Igo, CRNA   Anesthesia Intra Meds edited

## 2016-01-21 DIAGNOSIS — S81811D Laceration without foreign body, right lower leg, subsequent encounter: Secondary | ICD-10-CM | POA: Diagnosis not present

## 2016-01-21 NOTE — Op Note (Signed)
Dictation Number: [id number]

## 2016-01-22 DIAGNOSIS — S81811A Laceration without foreign body, right lower leg, initial encounter: Secondary | ICD-10-CM | POA: Diagnosis not present

## 2016-01-22 NOTE — Op Note (Signed)
NAME:  Antonio Manning, Antonio Manning                  ACCOUNT NO.:  [id number]  MEDICAL RECORD NO.:  [id number]  LOCATION:                                 FACILITY:  PHYSICIAN:  Estill Bamberg. Ronnie Derby, M.D. DATE OF BIRTH:  [birth year]  DATE OF PROCEDURE: DATE OF DISCHARGE:                              OPERATIVE REPORT   SURGEON:  Estill Bamberg. Ronnie Derby, M.D.  ASSISTANT:  Carlyon Shadow, PA-C.  ANESTHESIA:  General.  PREOPERATIVE DIAGNOSIS:  Right leg laceration (lawnmower injury).  PROCEDURE:  Right irrigation and debridement and complex closure.  INDICATION FOR PROCEDURE:  Patient is a 75 year old with lawnmower injury with blade cut to his anterior lower leg with exposed vessels, muscle tissue, and bone.  Informed consent obtained.  DESCRIPTION OF PROCEDURE:  The patient was taken to the operating room, administered general anesthesia.  Right leg prepped and draped in the usual fashion.  The large incision was washed out with the pulse lavage system with 1500 mL of lactated Ringer's.  Nonviable tissue was removed with pickups and forceps.  I then performed a complex closure using 3-0 nylon sutures along the lengthy incision requiring greater than a dozen horizontal mattress sutures.  I then dressed with Xeroform, dressing sponges, sterile Webril, and Ace wrap.  Complications none.  Drains none.    ______________________________ Estill Bamberg. Ronnie Derby, M.D.   ______________________________ Estill Bamberg. Ronnie Derby, M.D.    SDL/MEDQ  D:  01/21/2016  T:  01/22/2016  Job:  [id number]

## 2016-01-24 LAB — CUP PACEART REMOTE DEVICE CHECK: Date Time Interrogation Session: 20170827200555

## 2016-01-24 NOTE — Progress Notes (Signed)
Carelink summary report received. Battery status OK. Normal device function. No new symptom episodes, tachy episodes, brady, or pause episodes. No new AF episodes. Monthly summary reports and ROV/PRN 

## 2016-01-27 ENCOUNTER — Ambulatory Visit (INDEPENDENT_AMBULATORY_CARE_PROVIDER_SITE_OTHER): Payer: Medicare Other | Admitting: *Deleted

## 2016-01-27 DIAGNOSIS — I4892 Unspecified atrial flutter: Secondary | ICD-10-CM

## 2016-01-28 ENCOUNTER — Other Ambulatory Visit: Payer: Self-pay | Admitting: *Deleted

## 2016-01-28 MED ORDER — APIXABAN 5 MG PO TABS
5.0000 mg | ORAL_TABLET | Freq: Two times a day (BID) | ORAL | 9 refills | Status: DC
Start: 2016-01-28 — End: 2016-05-25

## 2016-01-28 NOTE — Progress Notes (Signed)
[facility] Summary Report / Loop Recorder 

## 2016-02-06 DIAGNOSIS — E785 Hyperlipidemia, unspecified: Secondary | ICD-10-CM | POA: Diagnosis not present

## 2016-02-06 DIAGNOSIS — Z79899 Other long term (current) drug therapy: Secondary | ICD-10-CM | POA: Diagnosis not present

## 2016-02-06 DIAGNOSIS — I480 Paroxysmal atrial fibrillation: Secondary | ICD-10-CM | POA: Diagnosis not present

## 2016-02-09 DIAGNOSIS — E785 Hyperlipidemia, unspecified: Secondary | ICD-10-CM | POA: Diagnosis not present

## 2016-02-09 DIAGNOSIS — Z79899 Other long term (current) drug therapy: Secondary | ICD-10-CM | POA: Diagnosis not present

## 2016-02-09 DIAGNOSIS — I480 Paroxysmal atrial fibrillation: Secondary | ICD-10-CM | POA: Diagnosis not present

## 2016-02-26 ENCOUNTER — Ambulatory Visit (INDEPENDENT_AMBULATORY_CARE_PROVIDER_SITE_OTHER): Payer: Medicare Other | Admitting: *Deleted

## 2016-02-26 DIAGNOSIS — I4892 Unspecified atrial flutter: Secondary | ICD-10-CM | POA: Diagnosis not present

## 2016-03-01 NOTE — Progress Notes (Signed)
[facility] Summary Report / Loop Recorder 

## 2016-03-27 LAB — CUP PACEART REMOTE DEVICE CHECK
Date Time Interrogation Session: 20171026213644
MDC IDC PG IMPLANT DT: 20170628

## 2016-03-27 NOTE — Progress Notes (Signed)
Carelink summary report received. Battery status OK. Normal device function. No new symptom episodes, tachy episodes, brady, or pause episodes. 1 AF 0%, ECG appears SR w/ PVCs. Monthly summary reports and ROV/PRN

## 2016-03-29 ENCOUNTER — Ambulatory Visit (INDEPENDENT_AMBULATORY_CARE_PROVIDER_SITE_OTHER): Payer: Medicare Other | Admitting: *Deleted

## 2016-03-29 DIAGNOSIS — I4892 Unspecified atrial flutter: Secondary | ICD-10-CM

## 2016-03-30 NOTE — Progress Notes (Signed)
[facility] Summary Report / Loop Recorder 

## 2016-04-27 ENCOUNTER — Ambulatory Visit (INDEPENDENT_AMBULATORY_CARE_PROVIDER_SITE_OTHER): Payer: Medicare Other | Admitting: *Deleted

## 2016-04-27 DIAGNOSIS — I4892 Unspecified atrial flutter: Secondary | ICD-10-CM

## 2016-04-28 NOTE — Progress Notes (Signed)
[facility] Summary Report / Loop Recorder 

## 2016-05-07 ENCOUNTER — Encounter: Payer: Self-pay | Admitting: *Deleted

## 2016-05-07 LAB — CUP PACEART REMOTE DEVICE CHECK
Date Time Interrogation Session: 20171125221407
MDC IDC PG IMPLANT DT: 20170628

## 2016-05-25 ENCOUNTER — Ambulatory Visit (INDEPENDENT_AMBULATORY_CARE_PROVIDER_SITE_OTHER): Payer: Medicare Other | Admitting: Internal Medicine

## 2016-05-25 ENCOUNTER — Encounter: Payer: Self-pay | Admitting: Internal Medicine

## 2016-05-25 VITALS — BP 120/88 | HR 52 | Ht 72.0 in | Wt 164.0 lb

## 2016-05-25 DIAGNOSIS — I480 Paroxysmal atrial fibrillation: Secondary | ICD-10-CM | POA: Diagnosis not present

## 2016-05-25 MED ORDER — APIXABAN 5 MG PO TABS
5.0000 mg | ORAL_TABLET | Freq: Two times a day (BID) | ORAL | 3 refills | Status: DC
Start: 2016-05-25 — End: 2017-02-22

## 2016-05-25 NOTE — Patient Instructions (Signed)

## 2016-05-25 NOTE — Progress Notes (Signed)
HPI Mr. Salon returns today to discussed atrial arrhythmias seen on his ILR. He is a pleasant 76 yo man with a recent cryptogenic stroke, who underwent ILR insertion. He has been found to be out of rhythm with both atrial flutter (mostly) and atrial fib. He is minimally symptomatic. He feels tired. He notes that he injured his lower leg several months ago and had to have surgery. He was weakened by the event. Allergies  Allergen Reactions  . Codeine Nausea And Vomiting     Current Outpatient Prescriptions  Medication Sig Dispense Refill  . acetaminophen (TYLENOL) 500 MG tablet Take 1,000 mg by mouth every 6 (six) hours as needed (pain).    Marland Kitchen apixaban (ELIQUIS) 5 MG TABS tablet Take 1 tablet (5 mg total) by mouth 2 (two) times daily. 90 tablet 3  . atorvastatin (LIPITOR) 20 MG tablet Take 20 mg by mouth at bedtime.   3  . Calcium Carbonate-Vitamin D (CALCIUM + D PO) Take 1 tablet by mouth at bedtime.     Marland Kitchen GLUCOSAMINE-CHONDROITIN PO Take 1 tablet by mouth at bedtime. Triple strength     . Multiple Vitamin (MULITIVITAMIN WITH MINERALS) TABS Take 1 tablet by mouth at bedtime.      No current facility-administered medications for this visit.      Past Medical History:  Diagnosis Date  . Arthritis 06-12-2011   osteoarthritis,lt. hip, and neck area  . Cancer ([place]) Jun 12, 2011   skin cancer lesions-multiple  . Cataract 06-12-11   right  . Cholesterol serum elevated 06/12/11   tx. Lipitor  . Expressive aphasia 10/2015  . Hearing loss 06-12-11   wears hearing aids bilaterally  . Kidney calculi 2011/06/12   past hx. x1-passed on own, mild prostate issues-no meds  . PONV (postoperative nausea and vomiting) Jun 12, 2011   with cataract surgery  . Stroke ([place]) 10/2015    ROS:   All systems reviewed and negative except as noted in the HPI.   Past Surgical History:  Procedure Laterality Date  . BACK SURGERY  12-Jun-2011   Lumbar disc surgery-no hardware  . CATARACT EXTRACTION W/ INTRAOCULAR LENS  IMPLANT  Jun 12, 2011   left eye  . EP IMPLANTABLE DEVICE N/A 10/29/2015   Procedure: Loop Recorder Insertion;  Surgeon: Evans Lance, MD;  Location: [facility];  Service: Cardiovascular;  Laterality: N/A;  . EYE SURGERY     eye lid lift   . I&D EXTREMITY Right 01/17/2016   Procedure: IRRIGATION AND DEBRIDEMENT LEG LACERATION;  Surgeon: Vickey Huger, MD;  Location: [facility];  Service: Orthopedics;  Laterality: Right;  . TEE WITHOUT CARDIOVERSION N/A 10/29/2015   Procedure: TRANSESOPHAGEAL ECHOCARDIOGRAM (TEE);  Surgeon: Larey Dresser, MD;  Location: [facility];  Service: Cardiovascular;  Laterality: N/A;  . TOTAL HIP ARTHROPLASTY  05/20/2011   Procedure: TOTAL HIP ARTHROPLASTY ANTERIOR APPROACH;  Surgeon: Mauri Pole, MD;  Location: [facility];  Service: Orthopedics;  Laterality: Left;     Family History  Problem Relation Age of Onset  . Heart Problems Mother   . Cancer Father   . Cancer Brother   . Stroke Brother   . Heart Problems Brother   . Cancer Brother   . Heart Problems Brother   . Heart Problems Sister   . Hyperlipidemia Sister   . Birth defects Child   . Other Child     one kidney     Social History   Social History  . Marital status: Married  Spouse name: N/A  . Number of children: 3  . Years of education: 12   Occupational History  . lawn service - retired    Social History Main Topics  . Smoking status: Former Smoker    Years: 25.00    Types: Cigarettes    Quit date: 05/12/1988  . Smokeless tobacco: Never Used  . Alcohol use No  . Drug use: No  . Sexual activity: Yes   Other Topics Concern  . Not on file   Social History Narrative  . No narrative on file     BP 120/88   Pulse (!) 52   Ht 6' (1.829 m)   Wt 164 lb (74.4 kg)   BMI 22.24 kg/m   Physical Exam:  Well appearing NAD HEENT: Unremarkable Neck:  No JVD, no thyromegally Lymphatics:  No adenopathy Back:  No CVA tenderness Lungs:  Clear with no wheezes HEART:  Regular rate  rhythm, no murmurs, no rubs, no clicks Abd:  soft, positive bowel sounds, no organomegally, no rebound, no guarding Ext:  2 plus pulses, no edema, no cyanosis, no clubbing Skin:  No rashes no nodules Neuro:  CN II through XII intact, motor grossly intact   ILR - episodes of atrial fib with no bradycardia  Assess/Plan: 1. Cryptogenic stroke - he appears to be having atrial fib which are very brief, lasting only a few minutes. Will start anti-coag and stop ASA 2. PAF - he looks to be having both flutter and fib. We will undergo watchful waiting. 3. Dyslipidemia - he will continue his statin therapy. 4. Fatigue - this is better but still present. Will follow.  Antonio Manning, M.D.

## 2016-05-26 ENCOUNTER — Ambulatory Visit (INDEPENDENT_AMBULATORY_CARE_PROVIDER_SITE_OTHER): Payer: Medicare Other | Admitting: *Deleted

## 2016-05-26 DIAGNOSIS — I480 Paroxysmal atrial fibrillation: Secondary | ICD-10-CM | POA: Diagnosis not present

## 2016-05-27 LAB — CUP PACEART INCLINIC DEVICE CHECK
MDC IDC PG IMPLANT DT: 20170628
MDC IDC SESS DTM: 20180123212955

## 2016-05-27 NOTE — Progress Notes (Signed)
[facility] Summary Report / Loop Recorder 

## 2016-06-14 LAB — CUP PACEART REMOTE DEVICE CHECK
Implantable Pulse Generator Implant Date: 20170628
MDC IDC SESS DTM: 20171225230559

## 2016-06-22 ENCOUNTER — Ambulatory Visit: Payer: Medicare Other | Admitting: Nurse Practitioner

## 2016-06-25 ENCOUNTER — Ambulatory Visit (INDEPENDENT_AMBULATORY_CARE_PROVIDER_SITE_OTHER): Payer: Medicare Other | Admitting: *Deleted

## 2016-06-25 DIAGNOSIS — I480 Paroxysmal atrial fibrillation: Secondary | ICD-10-CM | POA: Diagnosis not present

## 2016-06-27 LAB — CUP PACEART REMOTE DEVICE CHECK
Implantable Pulse Generator Implant Date: 20170628
MDC IDC SESS DTM: 20180124234033

## 2016-06-28 NOTE — Progress Notes (Signed)
[facility] Summary Report / Loop Recorder 

## 2016-07-07 ENCOUNTER — Ambulatory Visit: Payer: Medicare Other | Admitting: Nurse Practitioner

## 2016-07-13 DIAGNOSIS — C44219 Basal cell carcinoma of skin of left ear and external auricular canal: Secondary | ICD-10-CM | POA: Diagnosis not present

## 2016-07-13 DIAGNOSIS — L821 Other seborrheic keratosis: Secondary | ICD-10-CM | POA: Diagnosis not present

## 2016-07-13 DIAGNOSIS — L570 Actinic keratosis: Secondary | ICD-10-CM | POA: Diagnosis not present

## 2016-07-13 DIAGNOSIS — D1801 Hemangioma of skin and subcutaneous tissue: Secondary | ICD-10-CM | POA: Diagnosis not present

## 2016-07-13 DIAGNOSIS — D692 Other nonthrombocytopenic purpura: Secondary | ICD-10-CM | POA: Diagnosis not present

## 2016-07-13 DIAGNOSIS — D485 Neoplasm of uncertain behavior of skin: Secondary | ICD-10-CM | POA: Diagnosis not present

## 2016-07-13 DIAGNOSIS — Z85828 Personal history of other malignant neoplasm of skin: Secondary | ICD-10-CM | POA: Diagnosis not present

## 2016-07-13 DIAGNOSIS — D225 Melanocytic nevi of trunk: Secondary | ICD-10-CM | POA: Diagnosis not present

## 2016-07-13 DIAGNOSIS — L812 Freckles: Secondary | ICD-10-CM | POA: Diagnosis not present

## 2016-07-16 LAB — CUP PACEART REMOTE DEVICE CHECK
Date Time Interrogation Session: 20180223234256
MDC IDC PG IMPLANT DT: 20170628

## 2016-07-20 NOTE — Therapy (Signed)
[facility] [address] [place], [place], [address] Phone: [phone]   Fax:  [phone]  Patient Details  Name: Antonio Manning MRN: [id number] Date of Birth: [birth year] Referring Provider:  No ref. provider found  Encounter Date: 07/20/2016  SPEECH THERAPY DISCHARGE SUMMARY  Visits from Start of Care: one (eval)  Current functional level related to goals / functional outcomes: Pt did not schedule follow up appointments following evaluation on 11-20-15.   Remaining deficits: All deficits assumed to remain.   Education / Equipment: See eval note 11-20-15  Plan: Patient agrees to discharge.  Patient goals were not met. Patient is being discharged due to not returning since the last visit.  ?????       [place] ,[place], CCC-SLP  07/20/2016, 4:41 PM  [facility] [address] [place], [place], [address] Phone: [phone]   Fax:  [phone]

## 2016-07-22 ENCOUNTER — Ambulatory Visit (INDEPENDENT_AMBULATORY_CARE_PROVIDER_SITE_OTHER): Payer: Medicare Other | Admitting: Nurse Practitioner

## 2016-07-22 ENCOUNTER — Encounter: Payer: Self-pay | Admitting: Nurse Practitioner

## 2016-07-22 ENCOUNTER — Encounter (INDEPENDENT_AMBULATORY_CARE_PROVIDER_SITE_OTHER): Payer: Self-pay

## 2016-07-22 VITALS — BP 130/87 | HR 60 | Ht 72.0 in | Wt 166.6 lb

## 2016-07-22 DIAGNOSIS — I4892 Unspecified atrial flutter: Secondary | ICD-10-CM

## 2016-07-22 DIAGNOSIS — E785 Hyperlipidemia, unspecified: Secondary | ICD-10-CM | POA: Diagnosis not present

## 2016-07-22 DIAGNOSIS — I639 Cerebral infarction, unspecified: Secondary | ICD-10-CM | POA: Diagnosis not present

## 2016-07-22 NOTE — Progress Notes (Addendum)
[facility]  PATIENT: Antonio Manning DOB: 1940/09/29   REASON FOR VISIT: Follow-up for stroke HISTORY FROM: Patient and wife [name]    HISTORY OF PRESENT ILLNESS: UPDATE 03/22/2018CM Antonio Manning, 76 year old male returns for follow-up. He has history of hospital admission for stroke in June 2017. He had acute onset of expressive aphasia and right facial droop. He has not had further stroke or TIA symptoms since that time he is currently on eliquis secondary stroke prevention and atrial fibrillation. He has no bruising and no bleeding. In addition he is on Lipitor for hyperlipidemia. He denies any myalgias. Blood pressure well controlled in the office today at 130/87. He gets exercise by walking and playing golf. He returns for reevaluation   HISTORY 12/19/15 PS Antonio Manning is a 56 and a Caucasian male seen today for first office follow-up visit following hospital admission for stroke in June 2017. Presten Joost Nunziata is an 76 y.o. male with a history of hyperlipidemia, arthritis and hearing loss, brought to the emergency room and code stroke status following acute onset of expressive aphasia. Patient was last known well at 9:30 PM 10/26/2015. He woke up at 2 AM and was unable to express himself verbally. He can understand well what was being said to him. Right facial droop was noted by his wife. No weakness of extremities was noted. CT scan of his head showed no acute intracranial abnormality. Patient had been taking aspirin daily. NIH stroke score was 4. Patient was beyond time window for treatment with IV TPA when he arrived in the emergency room. Hewas admitted for further evaluation and treatment. CT scan that showed no acute abnormality but MRI scan showed a small left frontal MCA branch infarct and changes of small vessel disease. MRA of the brain showed no emergent large vessel occlusion on carotid ultrasound showed no significant extracranial stenosis. Transthoracic echo showed  normal ejection fraction. Transesophageal echo showed no current x-rays of embolism. A small patent foramen ovale was noted. Lower extremity venous Dopplers did not show definite deep and thrombosis. LDL cholesterol of 88 mg percent. Hemoglobin A1c was 5.6. Patient had a loop recorder inserted and 15 fact paroxysmal to fibrillation was found 3 weeks later. He has been started on eliquis which is considered but tolerating well. He does work outdoors a lot with pushes and so far has not had any major problems with bruising or bleeding. He is tolerating Lipitor well without muscle aches and pains. He states his speech has recovered completely but he does get tired more easily and minutes times times he may need to struggle with a few words. He states his blood pressure is well controlled and today it is 129/73.   REVIEW OF SYSTEMS: Full 14 system review of systems performed and notable only for those listed, all others are neg:  Constitutional: neg  Cardiovascular: neg Ear/Nose/Throat: neg  Skin: neg Eyes: neg Respiratory: neg Gastroitestinal: neg  Hematology/Lymphatic: neg  Endocrine: neg Musculoskeletal:neg Allergy/Immunology: neg Neurological: neg Psychiatric: neg Sleep : neg   ALLERGIES: Allergies  Allergen Reactions  . Codeine Nausea And Vomiting    HOME MEDICATIONS: Outpatient Medications Prior to Visit  Medication Sig Dispense Refill  . acetaminophen (TYLENOL) 500 MG tablet Take 1,000 mg by mouth every 6 (six) hours as needed (pain).    Marland Kitchen apixaban (ELIQUIS) 5 MG TABS tablet Take 1 tablet (5 mg total) by mouth 2 (two) times daily. 90 tablet 3  . atorvastatin (LIPITOR) 20 MG tablet  Take 20 mg by mouth at bedtime.   3  . Calcium Carbonate-Vitamin D (CALCIUM + D PO) Take 1 tablet by mouth at bedtime.     Marland Kitchen GLUCOSAMINE-CHONDROITIN PO Take 1 tablet by mouth 2 (two) times daily. Triple strength     . Multiple Vitamin (MULITIVITAMIN WITH MINERALS) TABS Take 1 tablet by mouth at bedtime.       No facility-administered medications prior to visit.     PAST MEDICAL HISTORY: Past Medical History:  Diagnosis Date  . Arthritis 05/27/11   osteoarthritis,lt. hip, and neck area  . Cancer ([facility]) 05-27-2011   skin cancer lesions-multiple  . Cataract 2011/05/27   right  . Cholesterol serum elevated 2011-05-27   tx. Lipitor  . Expressive aphasia 10/2015  . Hearing loss 05/27/11   wears hearing aids bilaterally  . Kidney calculi 2011-05-27   past hx. x1-passed on own, mild prostate issues-no meds  . PONV (postoperative nausea and vomiting) May 27, 2011   with cataract surgery  . Stroke [facility]) 10/2015    PAST SURGICAL HISTORY: Past Surgical History:  Procedure Laterality Date  . BACK SURGERY  05/27/11   Lumbar disc surgery-no hardware  . CATARACT EXTRACTION W/ INTRAOCULAR LENS IMPLANT  05/27/2011   left eye  . EP IMPLANTABLE DEVICE N/A 10/29/2015   Procedure: Loop Recorder Insertion;  Surgeon: Evans Lance, MD;  Location: [facility];  Service: Cardiovascular;  Laterality: N/A;  . EYE SURGERY     eye lid lift   . I&D EXTREMITY Right 01/17/2016   Procedure: IRRIGATION AND DEBRIDEMENT LEG LACERATION;  Surgeon: Vickey Huger, MD;  Location: [facility];  Service: Orthopedics;  Laterality: Right;  . TEE WITHOUT CARDIOVERSION N/A 10/29/2015   Procedure: TRANSESOPHAGEAL ECHOCARDIOGRAM (TEE);  Surgeon: Larey Dresser, MD;  Location: [facility];  Service: Cardiovascular;  Laterality: N/A;  . TOTAL HIP ARTHROPLASTY  05/20/2011   Procedure: TOTAL HIP ARTHROPLASTY ANTERIOR APPROACH;  Surgeon: Mauri Pole, MD;  [facility];  Service: Orthopedics;  Laterality: Left;    FAMILY HISTORY: Family History  Problem Relation Age of Onset  . Heart Problems Mother   . Cancer Father   . Cancer Brother   . Stroke Brother   . Heart Problems Brother   . Cancer Brother   . Heart Problems Brother   . Heart Problems Sister   . Hyperlipidemia Sister   . Birth defects Child   . Other Child     one kidney     SOCIAL HISTORY: Social History   Social History  . Marital status: Married    Spouse name: N/A  . Number of children: 3  . Years of education: 12   Occupational History  . lawn service - retired    Social History Main Topics  . Smoking status: Former Smoker    Years: 25.00    Types: Cigarettes    Quit date: May 26, 1988  . Smokeless tobacco: Never Used  . Alcohol use No  . Drug use: No  . Sexual activity: Yes   Other Topics Concern  . Not on file   Social History Narrative  . No narrative on file     PHYSICAL EXAM  Vitals:   07/22/16 1357  BP: 130/87  Pulse: 60  Weight: 166 lb 9.6 oz (75.6 kg)  Height: 6' (1.829 m)   Body mass index is 22.6 kg/m.  Generalized: Well developed, in no acute distress  Head: normocephalic and atraumatic,. Oropharynx benign  Neck: Supple, no carotid bruits  Cardiac: Regular rate rhythm, no murmur  Musculoskeletal: No deformity   Neurological examination   Mentation: Alert oriented to time, place, history taking. Attention span and concentration appropriate. Recent and remote memory intact.  Follows all commands speech and language fluent.   Cranial nerve II-XII: Fundoscopic exam reveals sharp disc margins.Pupils were equal round reactive to light extraocular movements were full, visual field were full on confrontational test. Facial sensation and strength were normal. hearing was intact to finger rubbing bilaterally. Uvula tongue midline. head turning and shoulder shrug were normal and symmetric.Tongue protrusion into cheek strength was normal. Motor: normal bulk and tone, full strength in the BUE, BLE, fine finger movements normal, no pronator drift. No focal weakness Sensory: normal and symmetric to light touch, pinprick, and  Vibration, in the upper and lower extremities Coordination: finger-nose-finger, heel-to-shin bilaterally, no dysmetria, no tremor Reflexes: 1+ upper lower and symmetric, plantar responses were flexor  bilaterally. Gait and Station: Rising up from seated position without assistance, normal stance,  moderate stride, good arm swing, smooth turning, able to perform tiptoe, and heel walking without difficulty. Tandem gait is steady. No assistive device  DIAGNOSTIC DATA (LABS, IMAGING, TESTING) - I reviewed patient records, labs, notes, testing and imaging myself where available.  Lab Results  Component Value Date   WBC 3.9 (L) 01/17/2016   HGB 12.2 (L) 01/17/2016   HCT 37.0 (L) 01/17/2016   MCV 91.8 01/17/2016   PLT 117 (L) 01/17/2016      Component Value Date/Time   NA 143 01/17/2016 2057   K 4.3 01/17/2016 2057   CL 109 01/17/2016 2057   CO2 27 01/17/2016 2057   GLUCOSE 91 01/17/2016 2057   BUN 24 (H) 01/17/2016 2057   CREATININE 1.02 01/17/2016 2057   CALCIUM 8.8 (L) 01/17/2016 2057   PROT 6.4 (L) 10/27/2015 0235   ALBUMIN 4.0 10/27/2015 0235   AST 21 10/27/2015 0235   ALT 16 (L) 10/27/2015 0235   ALKPHOS 56 10/27/2015 0235   BILITOT 1.2 10/27/2015 0235   GFRNONAA >60 01/17/2016 2057   GFRAA >60 01/17/2016 2057   Lab Results  Component Value Date   CHOL 151 10/27/2015   HDL 50 10/27/2015   LDLCALC 88 10/27/2015   TRIG 66 10/27/2015   CHOLHDL 3.0 10/27/2015   Lab Results  Component Value Date   HGBA1C 5.6 10/27/2015    ASSESSMENT AND PLAN 76 year old Caucasian male with left MCA branch infarct in June 4709 of embolic etiology due to paroxysmal atrial fibrillation. Vascular risk factors of atrial fibrillation and hyperlipidemia  And age. The patient is a current patient of Dr. Leonie Man  who is out of the office today . This note is sent to the work in doctor.     PLAN: Stressed the importance of management of risk factors to prevent further stroke Continue eliquis for secondary stroke prevention and atrial fibrillation Maintain strict control of hypertension with blood pressure goal below 130/90, today's reading 130/87 Cholesterol with LDL cholesterol less than 70,  followed by primary care,  continue Lipitor  Exercise by walking, playing golf, etc.  eat healthy diet with whole grains,  fresh fruits and vegetables Follow-up in 6 months Discussed risk for recurrent stroke/ TIA and answered additional questions This was a 25 min visit of high complexity with extensive review of history, hospital chart, counseling and answering questions Dennie Bible, [facility], [facility], APRN  [facility] [address] [place], [place] [address] [phone]  I reviewed the above  note and documentation by the Nurse Practitioner and agree with the history, physical exam, assessment and plan as outlined above. I was immediately available for face-to-face consultation. Star Age, MD, PhD [facility])

## 2016-07-22 NOTE — Patient Instructions (Addendum)
Stressed the importance of management of risk factors to prevent further stroke Continue eliquis for secondary stroke prevention and atrial fibrillation Maintain strict control of hypertension with blood pressure goal below 130/90, today's reading 130/87 Cholesterol with LDL cholesterol less than 70, followed by primary care,  continue Lipitor  Exercise by walking, playing golf, etc.  eat healthy diet with whole grains,  fresh fruits and vegetables Follow-up in 6 months Stroke Prevention Some medical conditions and behaviors are associated with an increased chance of having a stroke. You may prevent a stroke by making healthy choices and managing medical conditions. How can I reduce my risk of having a stroke?  Stay physically active. Get at least 30 minutes of activity on most or all days.  Do not smoke. It may also be helpful to avoid exposure to secondhand smoke.  Limit alcohol use. Moderate alcohol use is considered to be:  No more than 2 drinks per day for men.  No more than 1 drink per day for nonpregnant women.  Eat healthy foods. This involves:  Eating 5 or more servings of fruits and vegetables a day.  Making dietary changes that address high blood pressure (hypertension), high cholesterol, diabetes, or obesity.  Manage your cholesterol levels.  Making food choices that are high in fiber and low in saturated fat, trans fat, and cholesterol may control cholesterol levels.  Take any prescribed medicines to control cholesterol as directed by your health care provider.  Manage your diabetes.  Controlling your carbohydrate and sugar intake is recommended to manage diabetes.  Take any prescribed medicines to control diabetes as directed by your health care provider.  Control your hypertension.  Making food choices that are low in salt (sodium), saturated fat, trans fat, and cholesterol is recommended to manage hypertension.  Ask your health care provider if you need  treatment to lower your blood pressure. Take any prescribed medicines to control hypertension as directed by your health care provider.  If you are 59-64 years of age, have your blood pressure checked every 3-5 years. If you are 4 years of age or older, have your blood pressure checked every year.  Maintain a healthy weight.  Reducing calorie intake and making food choices that are low in sodium, saturated fat, trans fat, and cholesterol are recommended to manage weight.  Stop drug abuse.  Avoid taking birth control pills.  Talk to your health care provider about the risks of taking birth control pills if you are over 19 years old, smoke, get migraines, or have ever had a blood clot.  Get evaluated for sleep disorders (sleep apnea).  Talk to your health care provider about getting a sleep evaluation if you snore a lot or have excessive sleepiness.  Take medicines only as directed by your health care provider.  For some people, aspirin or blood thinners (anticoagulants) are helpful in reducing the risk of forming abnormal blood clots that can lead to stroke. If you have the irregular heart rhythm of atrial fibrillation, you should be on a blood thinner unless there is a good reason you cannot take them.  Understand all your medicine instructions.  Make sure that other conditions (such as anemia or atherosclerosis) are addressed. Get help right away if:  You have sudden weakness or numbness of the face, arm, or leg, especially on one side of the body.  Your face or eyelid droops to one side.  You have sudden confusion.  You have trouble speaking (aphasia) or understanding.  You have  sudden trouble seeing in one or both eyes.  You have sudden trouble walking.  You have dizziness.  You have a loss of balance or coordination.  You have a sudden, severe headache with no known cause.  You have new chest pain or an irregular heartbeat. Any of these symptoms may represent a  serious problem that is an emergency. Do not wait to see if the symptoms will go away. Get medical help at once. Call your local emergency services (911 in [place] Do not drive yourself to the hospital. This information is not intended to replace advice given to you by your health care provider. Make sure you discuss any questions you have with your health care provider. Document Released: 05/27/2004 Document Revised: 09/25/2015 Document Reviewed: 10/20/2012 Elsevier Interactive Patient Education  2017 [employer].

## 2016-07-26 ENCOUNTER — Ambulatory Visit (INDEPENDENT_AMBULATORY_CARE_PROVIDER_SITE_OTHER): Payer: Medicare Other | Admitting: *Deleted

## 2016-07-26 DIAGNOSIS — I480 Paroxysmal atrial fibrillation: Secondary | ICD-10-CM

## 2016-07-27 NOTE — Progress Notes (Signed)
[facility] Summary Report / Loop Recorder 

## 2016-08-11 LAB — CUP PACEART REMOTE DEVICE CHECK
Date Time Interrogation Session: 20180326001038
MDC IDC PG IMPLANT DT: 20170628

## 2016-08-24 ENCOUNTER — Ambulatory Visit (INDEPENDENT_AMBULATORY_CARE_PROVIDER_SITE_OTHER): Payer: Medicare Other | Admitting: *Deleted

## 2016-08-24 DIAGNOSIS — I480 Paroxysmal atrial fibrillation: Secondary | ICD-10-CM

## 2016-08-25 NOTE — Progress Notes (Signed)
[facility] Summary Report / Loop Recorder 

## 2016-09-10 LAB — CUP PACEART REMOTE DEVICE CHECK
Implantable Pulse Generator Implant Date: 20170628
MDC IDC SESS DTM: 20180425000941

## 2016-09-23 ENCOUNTER — Ambulatory Visit (INDEPENDENT_AMBULATORY_CARE_PROVIDER_SITE_OTHER): Payer: Medicare Other | Admitting: *Deleted

## 2016-09-23 DIAGNOSIS — I480 Paroxysmal atrial fibrillation: Secondary | ICD-10-CM

## 2016-09-24 LAB — CUP PACEART REMOTE DEVICE CHECK
MDC IDC PG IMPLANT DT: 20170628
MDC IDC SESS DTM: 20180525001029

## 2016-09-24 NOTE — Progress Notes (Signed)
[facility] Summary Report / Loop Recorder 

## 2016-10-25 ENCOUNTER — Ambulatory Visit (INDEPENDENT_AMBULATORY_CARE_PROVIDER_SITE_OTHER): Payer: Medicare Other | Admitting: *Deleted

## 2016-10-25 DIAGNOSIS — I480 Paroxysmal atrial fibrillation: Secondary | ICD-10-CM | POA: Diagnosis not present

## 2016-10-25 NOTE — Progress Notes (Signed)
[facility] Summary Report / Loop Recorder 

## 2016-11-03 LAB — CUP PACEART REMOTE DEVICE CHECK
Implantable Pulse Generator Implant Date: 20170628
MDC IDC SESS DTM: 20180624003931

## 2016-11-03 NOTE — Progress Notes (Signed)
Carelink summary report received. Battery status OK. Normal device function. No new symptom episodes, brady, or pause episodes. No new AF episodes. 4 tachy- 1 w/ ECG appears regular. No symptoms reported per EPIC. Monthly summary reports and ROV/PRN

## 2016-11-22 ENCOUNTER — Ambulatory Visit (INDEPENDENT_AMBULATORY_CARE_PROVIDER_SITE_OTHER): Payer: Medicare Other | Admitting: *Deleted

## 2016-11-22 DIAGNOSIS — I480 Paroxysmal atrial fibrillation: Secondary | ICD-10-CM | POA: Diagnosis not present

## 2016-11-23 NOTE — Progress Notes (Signed)
[facility] Summary Report / Loop Recorder 

## 2016-12-05 LAB — CUP PACEART REMOTE DEVICE CHECK
Date Time Interrogation Session: 20180724013933
Implantable Pulse Generator Implant Date: 20170628

## 2016-12-05 NOTE — Progress Notes (Signed)
Carelink summary report received. Battery status OK. Normal device function. No new symptom episodes, brady, or pause episodes. No new AF episodes. 2 tachy- ECGs appear SVT. No symptoms reported per EPIC. Monthly summary reports and ROV/PRN

## 2016-12-17 DIAGNOSIS — E785 Hyperlipidemia, unspecified: Secondary | ICD-10-CM | POA: Diagnosis not present

## 2016-12-17 DIAGNOSIS — R5383 Other fatigue: Secondary | ICD-10-CM | POA: Diagnosis not present

## 2016-12-22 ENCOUNTER — Ambulatory Visit (INDEPENDENT_AMBULATORY_CARE_PROVIDER_SITE_OTHER): Payer: Medicare Other | Admitting: *Deleted

## 2016-12-22 DIAGNOSIS — I480 Paroxysmal atrial fibrillation: Secondary | ICD-10-CM

## 2016-12-23 NOTE — Progress Notes (Signed)
[facility] Summary Report / Loop Recorder 

## 2016-12-28 LAB — CUP PACEART REMOTE DEVICE CHECK
MDC IDC PG IMPLANT DT: 20170628
MDC IDC SESS DTM: 20180823014024

## 2017-01-21 ENCOUNTER — Ambulatory Visit (INDEPENDENT_AMBULATORY_CARE_PROVIDER_SITE_OTHER): Payer: Medicare Other | Admitting: *Deleted

## 2017-01-21 DIAGNOSIS — I480 Paroxysmal atrial fibrillation: Secondary | ICD-10-CM | POA: Diagnosis not present

## 2017-01-24 NOTE — Progress Notes (Signed)
[facility] Summary Report / Loop Recorder 

## 2017-01-25 ENCOUNTER — Ambulatory Visit (INDEPENDENT_AMBULATORY_CARE_PROVIDER_SITE_OTHER): Payer: Medicare Other | Admitting: Neurology

## 2017-01-25 ENCOUNTER — Encounter: Payer: Self-pay | Admitting: Neurology

## 2017-01-25 VITALS — BP 139/80 | HR 52 | Wt 161.2 lb

## 2017-01-25 DIAGNOSIS — M791 Myalgia, unspecified site: Principal | ICD-10-CM

## 2017-01-25 DIAGNOSIS — I639 Cerebral infarction, unspecified: Secondary | ICD-10-CM

## 2017-01-25 DIAGNOSIS — T466X5A Adverse effect of antihyperlipidemic and antiarteriosclerotic drugs, initial encounter: Secondary | ICD-10-CM

## 2017-01-25 LAB — CUP PACEART REMOTE DEVICE CHECK
Date Time Interrogation Session: 20180922021101
Implantable Pulse Generator Implant Date: 20170628

## 2017-01-25 MED ORDER — CO-ENZYME Q-10 50 MG PO CAPS: 100.0000 mg | ORAL_CAPSULE | Freq: Every day | ORAL | 3 refills | Status: DC

## 2017-01-25 NOTE — Progress Notes (Signed)
[facility] [address] [place]. [place] [address] [phone]       OFFICE FOLLOW UP VISIT NOTE  Antonio Manning Date of Birth:  01/15/1941 Medical Record Number:  [id number]   Referring MD:  Janece Canterbury  Reason for Referral:  stroke HPI: Initial visit 12/19/15 ; Antonio Manning is a 76 and a Caucasian male seen today for first office follow-up visit following hospital admission for stroke in June 2017. Antonio Manning is an 76 y.o. male with a history of hyperlipidemia, arthritis and hearing loss, brought to the emergency room and code stroke status following acute onset of expressive aphasia. Patient was last known well at 9:30 PM 10/26/2015. He woke up at 2 AM and was unable to express himself verbally. He can understand well what was being said to him. Right facial droop was noted by his wife. No weakness of extremities was noted. CT scan of his head showed no acute intracranial abnormality. Patient had been taking aspirin daily. NIH stroke score was 4. Patient was beyond time window for treatment with IV TPA when he arrived in the emergency room. He was admitted for further evaluation and treatment. CT scan that showed no acute abnormality but MRI scan showed a small left frontal MCA branch infarct and changes of small vessel disease. MRA of the brain showed no emergent large vessel occlusion on carotid ultrasound showed no significant extracranial stenosis. Transthoracic echo showed normal ejection fraction. Transesophageal echo showed no current x-rays of embolism. A small patent foramen ovale was noted. Lower extremity venous Dopplers did not show definite deep and thrombosis. LDL cholesterol of 88 mg percent. Hemoglobin A1c was 5.6. Patient had a loop recorder inserted and 15 fact paroxysmal to fibrillation was found 3 weeks later. He has been started on eliquis which is considered but tolerating well. He does work outdoors a lot with pushes and so far has not had any major  problems with bruising or bleeding. He is tolerating Lipitor well without muscle aches and pains. He states his speech has recovered completely but he does get tired more easily and minutes times times he may need to struggle with a few words. He states his blood pressure is well controlled and today it is 129/73. Update 01/25/2017 ; He returns for follow-up after last visit 1 year ago. His accompanied by his wife. He continues to do well without recurrent stroke or TIA symptoms. He is tolerating eliquis well without bleeding and only minor bruising. His blood pressure is well controlled today it is 139/81. Patient is still on Lipitor to 20 mg as if he states he is unable to tolerate higher dose. He feels tired and has muscle aches and pains. He plans to see his primary care physician a few weeks and will have follow-up lipid profile checked. He has not tried CoQ10. He has had similar intolerance problems with Zocor and Pravachol in the past. He has not tried Crestor. He is bothered by right knee pain which limits his ability to walk. ROS:   14 system review of systems is positive for  eye itching, fatigue, painful urination, joint and back pain, aching muscles, easy bruising and bleeding, dizziness, agitation and all other systems negative and all other systems negative  PMH:  Past Medical History:  Diagnosis Date  . Arthritis 05-13-11   osteoarthritis,lt. hip, and neck area  . Cancer ([place]) 05-13-11   skin cancer lesions-multiple  . Cataract 05-13-11   right  . Cholesterol serum  elevated 05/14/2011   tx. Lipitor  . Expressive aphasia 10/2015  . Hearing loss 05/14/11   wears hearing aids bilaterally  . Kidney calculi 05-14-11   past hx. x1-passed on own, mild prostate issues-no meds  . PONV (postoperative nausea and vomiting) 2011-05-14   with cataract surgery  . Stroke [facility]) 10/2015    Social History:  Social History   Social History  . Marital status: Married    Spouse name: N/A  . Number of  children: 3  . Years of education: 12   Occupational History  . lawn service - retired    Social History Main Topics  . Smoking status: Former Smoker    Years: 25.00    Types: Cigarettes    Quit date: 05-13-1988  . Smokeless tobacco: Never Used  . Alcohol use No  . Drug use: No  . Sexual activity: Yes   Other Topics Concern  . Not on file   Social History Narrative  . No narrative on file    Medications:   Current Outpatient Prescriptions on File Prior to Visit  Medication Sig Dispense Refill  . acetaminophen (TYLENOL) 500 MG tablet Take 1,000 mg by mouth every 6 (six) hours as needed (pain).    Marland Kitchen apixaban (ELIQUIS) 5 MG TABS tablet Take 1 tablet (5 mg total) by mouth 2 (two) times daily. 90 tablet 3  . atorvastatin (LIPITOR) 20 MG tablet Take 20 mg by mouth at bedtime.   3  . GLUCOSAMINE-CHONDROITIN PO Take 1 tablet by mouth 2 (two) times daily. Triple strength     . Calcium Carbonate-Vitamin D (CALCIUM + D PO) Take 1 tablet by mouth at bedtime.     . Multiple Vitamin (MULITIVITAMIN WITH MINERALS) TABS Take 1 tablet by mouth at bedtime.      No current facility-administered medications on file prior to visit.     Allergies:   Allergies  Allergen Reactions  . Codeine Nausea And Vomiting    Physical Exam General: well developed, well nourished elderly Caucasian male, seated, in no evident distress Head: head normocephalic and atraumatic.   Neck: supple with no carotid or supraclavicular bruits Cardiovascular: regular rate and rhythm, no murmurs Musculoskeletal: no deformity Skin:  no rash/petichiae Vascular:  Normal pulses all extremities  Neurologic Exam Mental Status: Awake and fully alert. Oriented to place and time. Recent and remote memory intact. Attention span, concentration and fund of knowledge appropriate. Mood and affect appropriate.  Cranial Nerves: Fundoscopic exam not done. Pupils equal, briskly reactive to light. Extraocular movements full without  nystagmus. Visual fields full to confrontation. Hearing intact. Facial sensation intact. Face, tongue, palate moves normally and symmetrically.  Motor: Normal bulk and tone. Normal strength in all tested extremity muscles. Sensory.: intact to touch , pinprick , position and vibratory sensation.  Coordination: Rapid alternating movements normal in all extremities. Finger-to-nose and heel-to-shin performed accurately bilaterally. Gait and Station: Arises from chair without difficulty. Stance is normal. Gait demonstrates normal stride length and balance but favors right knee due to pain . Able to heel, toe and tandem walk with  difficulty.  Reflexes: 1+ and symmetric. Toes downgoing.       ASSESSMENT: 76 year old Caucasian male with left MCA branch infarct in June 6644 of embolic etiology due to paroxysmal atrial fibrillation. Vascular risk factors of atrial fibrillation and hyperlipidemia and borderline diabetes    PLAN: I had a long d/w patient and his wife about his recent stroke, risk for recurrent stroke/TIAs, personally independently reviewed imaging  studies and stroke evaluation results and answered questions.Continue Eliquis (apixaban) daily  for secondary stroke prevention and maintain strict control of hypertension with blood pressure goal below 130/90, diabetes with hemoglobin A1c goal below 6.5% and lipids with LDL cholesterol goal below 70 mg/dL. I also advised the patient to eat a healthy diet with plenty of whole grains, cereals, fruits and vegetables, exercise regularly and maintain ideal body weight. I have advised him to start taking coenzyme Q 10 200 mg daily to help with his statin myalgias and if they still persist consider switching to Crestor or even the new PCSK 9 inhibitor injections and to discuss this with his primary physician. Greater than 50% time during this 25 minute visit was spent on counseling and coordination of care about his stroke, statin myalgias and answering  questions Since it has been stroke free from more than a year no routine scheduled appointment with me is necessary but he may return for follow-up in the future only  if needed. Antony Contras, MD  [facility] [address] [place], [place] [address]  Phone [phone] Fax [phone] Note: This document was prepared with digital dictation and possible smart phrase technology. Any transcriptional errors that result from this process are unintentional.

## 2017-01-25 NOTE — Patient Instructions (Signed)
I had a long d/w patient and his wife about his recent stroke, risk for recurrent stroke/TIAs, personally independently reviewed imaging studies and stroke evaluation results and answered questions.Continue Eliquis (apixaban) daily  for secondary stroke prevention and maintain strict control of hypertension with blood pressure goal below 130/90, diabetes with hemoglobin A1c goal below 6.5% and lipids with LDL cholesterol goal below 70 mg/dL. I also advised the patient to eat a healthy diet with plenty of whole grains, cereals, fruits and vegetables, exercise regularly and maintain ideal body weight. I have advised him to start taking coenzyme Q 10 200 mg daily to help with his statin myalgias and if they still persist consider switching to Crestor or even the new PCSK 9 inhibitor injections and to discuss this with his primary physician. Since he has been stroke free from more than a year no routine scheduled appointment with me is necessary but he may return for follow-up in the future only  if needed.

## 2017-01-31 DIAGNOSIS — Z85828 Personal history of other malignant neoplasm of skin: Secondary | ICD-10-CM | POA: Diagnosis not present

## 2017-01-31 DIAGNOSIS — L570 Actinic keratosis: Secondary | ICD-10-CM | POA: Diagnosis not present

## 2017-01-31 DIAGNOSIS — D1721 Benign lipomatous neoplasm of skin and subcutaneous tissue of right arm: Secondary | ICD-10-CM | POA: Diagnosis not present

## 2017-01-31 DIAGNOSIS — L814 Other melanin hyperpigmentation: Secondary | ICD-10-CM | POA: Diagnosis not present

## 2017-01-31 DIAGNOSIS — L723 Sebaceous cyst: Secondary | ICD-10-CM | POA: Diagnosis not present

## 2017-01-31 DIAGNOSIS — L821 Other seborrheic keratosis: Secondary | ICD-10-CM | POA: Diagnosis not present

## 2017-02-16 ENCOUNTER — Telehealth: Payer: Self-pay | Admitting: Cardiology

## 2017-02-16 NOTE — Telephone Encounter (Signed)
Spoke w/ pt wife and requested that he send a manual transmission b/c his home monitor has not updated in at least 14 days.   

## 2017-02-21 ENCOUNTER — Ambulatory Visit (INDEPENDENT_AMBULATORY_CARE_PROVIDER_SITE_OTHER): Payer: Medicare Other | Admitting: *Deleted

## 2017-02-21 DIAGNOSIS — I480 Paroxysmal atrial fibrillation: Secondary | ICD-10-CM

## 2017-02-21 LAB — CUP PACEART REMOTE DEVICE CHECK
Date Time Interrogation Session: 20181022023757
Implantable Pulse Generator Implant Date: 20170628

## 2017-02-21 NOTE — Progress Notes (Signed)
[facility] Summary Report / Loop Recorder 

## 2017-02-22 ENCOUNTER — Other Ambulatory Visit: Payer: Self-pay | Admitting: Internal Medicine

## 2017-02-22 NOTE — Telephone Encounter (Addendum)
02/22/17: Eliquis 5mg  refill received; pt is 76 yrs old, wt-73.1kg, last seen by Dr. Lovena Le on 05/25/16, last Crea-1.02 on 01/17/16-therefore, needs current labs, Called pt's PCP office & she will fax over labs.  02/23/17: received labs from PCP, which were done on 12/17/16-Hgb-13.4 & Hct-39.5, Crea-0.90; Per dosing criteria pt on correct dosage of Eliquis 5mg  twice a day. Will send in refill request.

## 2017-03-22 ENCOUNTER — Ambulatory Visit (INDEPENDENT_AMBULATORY_CARE_PROVIDER_SITE_OTHER): Payer: Medicare Other | Admitting: *Deleted

## 2017-03-22 DIAGNOSIS — I480 Paroxysmal atrial fibrillation: Secondary | ICD-10-CM

## 2017-03-23 NOTE — Progress Notes (Signed)
[facility] Summary Report / Loop Recorder 

## 2017-04-07 LAB — CUP PACEART REMOTE DEVICE CHECK
Date Time Interrogation Session: 20181121031015
Implantable Pulse Generator Implant Date: 20170628

## 2017-04-21 ENCOUNTER — Ambulatory Visit (INDEPENDENT_AMBULATORY_CARE_PROVIDER_SITE_OTHER): Payer: Medicare Other | Admitting: *Deleted

## 2017-04-21 DIAGNOSIS — I480 Paroxysmal atrial fibrillation: Secondary | ICD-10-CM

## 2017-04-22 NOTE — Progress Notes (Signed)
[facility] Summary Report / Loop Recorder 

## 2017-04-29 DIAGNOSIS — Z23 Encounter for immunization: Secondary | ICD-10-CM | POA: Diagnosis not present

## 2017-04-29 DIAGNOSIS — K219 Gastro-esophageal reflux disease without esophagitis: Secondary | ICD-10-CM | POA: Diagnosis not present

## 2017-04-29 DIAGNOSIS — M1990 Unspecified osteoarthritis, unspecified site: Secondary | ICD-10-CM | POA: Diagnosis not present

## 2017-05-16 ENCOUNTER — Telehealth: Payer: Self-pay | Admitting: Internal Medicine

## 2017-05-16 ENCOUNTER — Telehealth: Payer: Self-pay

## 2017-05-16 NOTE — Telephone Encounter (Signed)
Mrs. Antonio Manning is calling because [name] is having a tooth pull on tomorrow and he is on Eliquis . The Dentist office says that it would be ok if he didn't stop taking the medication . She is wanting to know what should he do . Please call

## 2017-05-16 NOTE — Telephone Encounter (Signed)
Returned wife call.  Notified wife Dr. Lovena Le states to follow dentist recommendation.  Wife indicates understanding.

## 2017-05-16 NOTE — Telephone Encounter (Signed)
[name] is is because  [name] is having a  a tooth pull on tomorrow and he is on Eliquis.  The Dentist office says that it would be ok if he didn't stop taking the medication . She is wanting to know what should he do . Please call

## 2017-05-23 ENCOUNTER — Ambulatory Visit (INDEPENDENT_AMBULATORY_CARE_PROVIDER_SITE_OTHER): Payer: Medicare Other | Admitting: *Deleted

## 2017-05-23 DIAGNOSIS — I480 Paroxysmal atrial fibrillation: Secondary | ICD-10-CM | POA: Diagnosis not present

## 2017-05-23 LAB — CUP PACEART REMOTE DEVICE CHECK
Date Time Interrogation Session: 20181221044121
MDC IDC PG IMPLANT DT: 20170628

## 2017-05-23 NOTE — Progress Notes (Signed)
[facility] Summary Report / Loop Recorder 

## 2017-05-31 LAB — CUP PACEART REMOTE DEVICE CHECK
Date Time Interrogation Session: 20190120093929
MDC IDC PG IMPLANT DT: 20170628

## 2017-06-01 ENCOUNTER — Telehealth: Payer: Self-pay | Admitting: *Deleted

## 2017-06-01 NOTE — Telephone Encounter (Signed)
Spoke with patient's wife to request a manual Carelink transmission for review of "tachy" episodes that have not transmitted automatically.  Assisted patient and husband with sending manual transmission.  Transmission received--all available tachy episodes appear AF/A-flutter and SVT, longest available tachy episode 73min 40sec.  Patient's wife reports that he has not had any symptoms of ShOB, chest discomfort, or palpitations.  She is aware that Dr. Lovena Le will review episodes and we will call if any additional recommendations at that time.  Patient's wife is aware of 07/01/17 appointment with Dr. Lovena Le.  She is appreciative of call and denies questions or concerns at this time.

## 2017-06-21 ENCOUNTER — Ambulatory Visit (INDEPENDENT_AMBULATORY_CARE_PROVIDER_SITE_OTHER): Payer: Medicare Other | Admitting: *Deleted

## 2017-06-21 DIAGNOSIS — I480 Paroxysmal atrial fibrillation: Secondary | ICD-10-CM | POA: Diagnosis not present

## 2017-06-22 NOTE — Progress Notes (Signed)
[facility] Summary Report / Loop Recorder 

## 2017-07-01 ENCOUNTER — Ambulatory Visit (INDEPENDENT_AMBULATORY_CARE_PROVIDER_SITE_OTHER): Payer: Medicare Other | Admitting: Internal Medicine

## 2017-07-01 ENCOUNTER — Encounter: Payer: Self-pay | Admitting: Internal Medicine

## 2017-07-01 VITALS — BP 122/82 | HR 49 | Ht 72.0 in | Wt 159.0 lb

## 2017-07-01 DIAGNOSIS — I4892 Unspecified atrial flutter: Secondary | ICD-10-CM

## 2017-07-01 DIAGNOSIS — I480 Paroxysmal atrial fibrillation: Secondary | ICD-10-CM

## 2017-07-01 NOTE — Progress Notes (Signed)
HPI [name] returns today to follow up on his atrial arrhythmias seen on his ILR. He is a pleasant 77 yo man with a cryptogenic stroke, who underwent ILR insertion. He has been found to be out of rhythm with both atrial flutter (mostly) and atrial fib. He is minimally symptomatic. He feels tired when he is out of rhythm but does not have palpitations. He has recovered from leg surgery. He notes generalized fatigue but has a h/o preserved LV function by echo less than 2 years ago. Allergies  Allergen Reactions  . Codeine Nausea And Vomiting     Current Outpatient Medications  Medication Sig Dispense Refill  . acetaminophen (TYLENOL) 500 MG tablet Take 1,000 mg by mouth every 6 (six) hours as needed (pain).    Marland Kitchen atorvastatin (LIPITOR) 20 MG tablet Take 20 mg by mouth at bedtime.   3  . Calcium Carbonate-Vitamin D (CALCIUM + D PO) Take 1 tablet by mouth at bedtime.     Marland Kitchen co-enzyme Q-10 50 MG capsule Take 2 capsules (100 mg total) by mouth daily. 60 capsule 3  . ELIQUIS 5 MG TABS tablet TAKE 1 TABLET (5 MG TOTAL) BY MOUTH 2 (TWO) TIMES DAILY. 180 tablet 1  . Multiple Vitamin (MULITIVITAMIN WITH MINERALS) TABS Take 1 tablet by mouth at bedtime.      No current facility-administered medications for this visit.      Past Medical History:  Diagnosis Date  . Arthritis May 26, 2011   osteoarthritis,lt. hip, and neck area  . Cancer ([place]) 05-26-11   skin cancer lesions-multiple  . Cataract 05-26-11   right  . Cholesterol serum elevated 26-May-2011   tx. Lipitor  . Expressive aphasia 10/2015  . Hearing loss 05-26-11   wears hearing aids bilaterally  . Kidney calculi 2011/05/26   past hx. x1-passed on own, mild prostate issues-no meds  . PONV (postoperative nausea and vomiting) 05-26-11   with cataract surgery  . Stroke ([place]) 10/2015    ROS:   All systems reviewed and negative except as noted in the HPI.   Past Surgical History:  Procedure Laterality Date  . BACK SURGERY  05-26-2011   Lumbar  disc surgery-no hardware  . CATARACT EXTRACTION W/ INTRAOCULAR LENS IMPLANT  May 26, 2011   left eye  . EP IMPLANTABLE DEVICE N/A 10/29/2015   Procedure: Loop Recorder Insertion;  Surgeon: Evans Lance, MD;  Location: [facility];  Service: Cardiovascular;  Laterality: N/A;  . EYE SURGERY     eye lid lift   . I&D EXTREMITY Right 01/17/2016   Procedure: IRRIGATION AND DEBRIDEMENT LEG LACERATION;  Surgeon: Vickey Huger, MD;  Location: [facility];  Service: Orthopedics;  Laterality: Right;  . TEE WITHOUT CARDIOVERSION N/A 10/29/2015   Procedure: TRANSESOPHAGEAL ECHOCARDIOGRAM (TEE);  Surgeon: Larey Dresser, MD;  Location: [facility];  Service: Cardiovascular;  Laterality: N/A;  . TOTAL HIP ARTHROPLASTY  05/20/2011   Procedure: TOTAL HIP ARTHROPLASTY ANTERIOR APPROACH;  Surgeon: Mauri Pole, MD;  Location: [facility];  Service: Orthopedics;  Laterality: Left;     Family History  Problem Relation Age of Onset  . Heart Problems Mother   . Cancer Father   . Cancer Brother   . Stroke Brother   . Heart Problems Brother   . Cancer Brother   . Heart Problems Brother   . Heart Problems Sister   . Hyperlipidemia Sister   . Birth defects Child   . Other Child  one kidney     Social History   Socioeconomic History  . Marital status: Married    Spouse name: Not on file  . Number of children: 3  . Years of education: 69  . Highest education level: Not on file  Social Needs  . Financial resource strain: Not on file  . Food insecurity - worry: Not on file  . Food insecurity - inability: Not on file  . Transportation needs - medical: Not on file  . Transportation needs - non-medical: Not on file  Occupational History  . Occupation: Quarry manager - retired  Tobacco Use  . Smoking status: Former Smoker    Years: 25.00    Types: Cigarettes    Last attempt to quit: 05/12/1988    Years since quitting: 29.1  . Smokeless tobacco: Never Used  Substance and Sexual Activity  . Alcohol  use: No  . Drug use: No  . Sexual activity: Yes  Other Topics Concern  . Not on file  Social History Narrative  . Not on file     BP 122/82 (BP Location: Left Arm, Patient Position: Sitting, Cuff Size: Normal)   Pulse (!) 49   Ht 6' (1.829 m)   Wt 159 lb (72.1 kg)   SpO2 98%   BMI 21.56 kg/m   Physical Exam:  Well appearing elderly man, NAD HEENT: Unremarkable Neck:  6 cm JVD, no thyromegally Lymphatics:  No adenopathy Back:  No CVA tenderness Lungs:  Clear with no wheezes HEART:  Regular rate rhythm, no murmurs, no rubs, no clicks Abd:  soft, positive bowel sounds, no organomegally, no rebound, no guarding Ext:  2 plus pulses, no edema, no cyanosis, no clubbing Skin:  No rashes no nodules Neuro:  CN II through XII intact, motor grossly intact  EKG - sinus bradycardia with first degree AV block and left axis.  DEVICE  Normal device function.  See PaceArt for details. Atrial fib and flutter noted  Assess/Plan: 1. Cryptogenic stroke - he has done well and had no recurrent symptoms. 2. PAF - he is symptomatic with his weak spells although he does not have palpitations. 3. Atrial flutter - he does not have palpitations but these are demonstrated with his ILR 4. Fatigue - the etiology is unclear. I do not think he is having angina and although he does have PAF, his episodes due not correlate with the chronic fatigue he is experiencing. He does not have more CHF so I will not repeat the echo. He is instructed to obtain screening labs from his primary MD.  Mikle Bosworth.D.

## 2017-07-01 NOTE — Patient Instructions (Signed)

## 2017-07-02 ENCOUNTER — Encounter: Payer: Self-pay | Admitting: Internal Medicine

## 2017-07-04 LAB — CUP PACEART INCLINIC DEVICE CHECK
Date Time Interrogation Session: 20190301193834
Implantable Pulse Generator Implant Date: 20170628

## 2017-07-07 ENCOUNTER — Other Ambulatory Visit: Payer: Self-pay | Admitting: Internal Medicine

## 2017-07-25 ENCOUNTER — Ambulatory Visit (INDEPENDENT_AMBULATORY_CARE_PROVIDER_SITE_OTHER): Payer: Medicare Other | Admitting: *Deleted

## 2017-07-25 DIAGNOSIS — I480 Paroxysmal atrial fibrillation: Secondary | ICD-10-CM | POA: Diagnosis not present

## 2017-07-25 LAB — CUP PACEART REMOTE DEVICE CHECK
Date Time Interrogation Session: 20190220132849
Implantable Pulse Generator Implant Date: 20170628

## 2017-07-25 NOTE — Progress Notes (Signed)
[facility] Summary Report / Loop Recorder 

## 2017-07-27 ENCOUNTER — Telehealth: Payer: Self-pay | Admitting: Cardiology

## 2017-07-27 NOTE — Telephone Encounter (Signed)
LMOVM requesting that pt send manual transmission b/c home monitor has not updated in at least 14 days.    

## 2017-08-02 ENCOUNTER — Encounter: Payer: Self-pay | Admitting: Cardiology

## 2017-08-02 DIAGNOSIS — L570 Actinic keratosis: Secondary | ICD-10-CM | POA: Diagnosis not present

## 2017-08-02 DIAGNOSIS — D485 Neoplasm of uncertain behavior of skin: Secondary | ICD-10-CM | POA: Diagnosis not present

## 2017-08-02 DIAGNOSIS — Z85828 Personal history of other malignant neoplasm of skin: Secondary | ICD-10-CM | POA: Diagnosis not present

## 2017-08-02 DIAGNOSIS — C44311 Basal cell carcinoma of skin of nose: Secondary | ICD-10-CM | POA: Diagnosis not present

## 2017-08-16 ENCOUNTER — Telehealth: Payer: Self-pay | Admitting: Internal Medicine

## 2017-08-16 ENCOUNTER — Encounter: Payer: Self-pay | Admitting: Internal Medicine

## 2017-08-16 NOTE — Telephone Encounter (Signed)
°  1. Has your device fired? no 2. Is you device beeping? no  3. Are you experiencing draining or swelling at device site? no  4. Are you calling to see if we received your device transmission? Yes, pt wife wants to know if he has to put it to his chest every night or will it automatically take care of it   5. Have you passed out? no   Please route to [place]

## 2017-08-16 NOTE — Telephone Encounter (Signed)
Spoke with patients wife and explained that ILR was up to date. I explained that she did not need to send a remote nightly and that we would call if we needed a manual transmission.

## 2017-08-19 ENCOUNTER — Other Ambulatory Visit: Payer: Self-pay | Admitting: Internal Medicine

## 2017-08-19 NOTE — Telephone Encounter (Signed)
Pt last saw Dr Lovena Le 07/01/17, last labs 12/17/16 Creat 0.90, age 77, weight 72.1kg, based on specified criteria pt is on appropriate dosage of Eliquis 5mg  BID.  Will refill rx.

## 2017-08-29 ENCOUNTER — Ambulatory Visit (INDEPENDENT_AMBULATORY_CARE_PROVIDER_SITE_OTHER): Payer: Medicare Other | Admitting: *Deleted

## 2017-08-29 DIAGNOSIS — I480 Paroxysmal atrial fibrillation: Secondary | ICD-10-CM

## 2017-08-29 NOTE — Progress Notes (Signed)
[facility] Summary Report / Loop Recorder 

## 2017-09-03 LAB — CUP PACEART REMOTE DEVICE CHECK
Date Time Interrogation Session: 20190325134200
Implantable Pulse Generator Implant Date: 20170628

## 2017-09-13 DIAGNOSIS — Z79899 Other long term (current) drug therapy: Secondary | ICD-10-CM | POA: Diagnosis not present

## 2017-09-13 DIAGNOSIS — I480 Paroxysmal atrial fibrillation: Secondary | ICD-10-CM | POA: Diagnosis not present

## 2017-09-13 DIAGNOSIS — E785 Hyperlipidemia, unspecified: Secondary | ICD-10-CM | POA: Diagnosis not present

## 2017-09-13 DIAGNOSIS — R5383 Other fatigue: Secondary | ICD-10-CM | POA: Diagnosis not present

## 2017-09-21 LAB — CUP PACEART REMOTE DEVICE CHECK
Date Time Interrogation Session: 20190427140850
MDC IDC PG IMPLANT DT: 20170628

## 2017-09-29 ENCOUNTER — Ambulatory Visit (INDEPENDENT_AMBULATORY_CARE_PROVIDER_SITE_OTHER): Payer: Medicare Other | Admitting: *Deleted

## 2017-09-29 DIAGNOSIS — I480 Paroxysmal atrial fibrillation: Secondary | ICD-10-CM | POA: Diagnosis not present

## 2017-09-29 NOTE — Progress Notes (Signed)
[facility] Summary Report / Loop Recorder 

## 2017-10-03 DIAGNOSIS — R630 Anorexia: Secondary | ICD-10-CM | POA: Diagnosis not present

## 2017-10-03 DIAGNOSIS — R5383 Other fatigue: Secondary | ICD-10-CM | POA: Diagnosis not present

## 2017-10-03 DIAGNOSIS — R0602 Shortness of breath: Secondary | ICD-10-CM | POA: Diagnosis not present

## 2017-10-24 LAB — CUP PACEART REMOTE DEVICE CHECK
Implantable Pulse Generator Implant Date: 20170628
MDC IDC SESS DTM: 20190530141130

## 2017-11-01 ENCOUNTER — Ambulatory Visit (INDEPENDENT_AMBULATORY_CARE_PROVIDER_SITE_OTHER): Payer: Medicare Other | Admitting: *Deleted

## 2017-11-01 DIAGNOSIS — I480 Paroxysmal atrial fibrillation: Secondary | ICD-10-CM | POA: Diagnosis not present

## 2017-11-02 NOTE — Progress Notes (Signed)
[facility] Summary Report / Loop Recorder 

## 2017-11-29 LAB — CUP PACEART REMOTE DEVICE CHECK
Date Time Interrogation Session: 20190702140707
Implantable Pulse Generator Implant Date: 20170628

## 2017-12-05 ENCOUNTER — Ambulatory Visit (INDEPENDENT_AMBULATORY_CARE_PROVIDER_SITE_OTHER): Payer: Medicare Other | Admitting: *Deleted

## 2017-12-05 DIAGNOSIS — I480 Paroxysmal atrial fibrillation: Secondary | ICD-10-CM | POA: Diagnosis not present

## 2017-12-06 NOTE — Progress Notes (Signed)
[facility] Summary Report / Loop Recorder 

## 2017-12-15 ENCOUNTER — Encounter: Payer: Self-pay | Admitting: Cardiovascular Disease

## 2017-12-15 DIAGNOSIS — R0609 Other forms of dyspnea: Secondary | ICD-10-CM | POA: Diagnosis not present

## 2017-12-15 DIAGNOSIS — D649 Anemia, unspecified: Secondary | ICD-10-CM | POA: Diagnosis not present

## 2017-12-15 DIAGNOSIS — I480 Paroxysmal atrial fibrillation: Secondary | ICD-10-CM | POA: Diagnosis not present

## 2017-12-15 DIAGNOSIS — E785 Hyperlipidemia, unspecified: Secondary | ICD-10-CM | POA: Diagnosis not present

## 2017-12-15 DIAGNOSIS — R5383 Other fatigue: Secondary | ICD-10-CM | POA: Diagnosis not present

## 2017-12-15 DIAGNOSIS — Z79899 Other long term (current) drug therapy: Secondary | ICD-10-CM | POA: Diagnosis not present

## 2017-12-20 ENCOUNTER — Encounter: Payer: Self-pay | Admitting: Cardiovascular Disease

## 2017-12-20 ENCOUNTER — Ambulatory Visit (INDEPENDENT_AMBULATORY_CARE_PROVIDER_SITE_OTHER): Payer: Medicare Other | Admitting: Cardiovascular Disease

## 2017-12-20 VITALS — BP 106/70 | HR 62 | Ht 72.0 in | Wt 160.0 lb

## 2017-12-20 DIAGNOSIS — Z7901 Long term (current) use of anticoagulants: Secondary | ICD-10-CM | POA: Diagnosis not present

## 2017-12-20 DIAGNOSIS — R5383 Other fatigue: Secondary | ICD-10-CM

## 2017-12-20 DIAGNOSIS — I480 Paroxysmal atrial fibrillation: Secondary | ICD-10-CM | POA: Diagnosis not present

## 2017-12-20 DIAGNOSIS — R0602 Shortness of breath: Secondary | ICD-10-CM | POA: Diagnosis not present

## 2017-12-20 NOTE — Patient Instructions (Signed)
Medication Instructions: Dr Sallyanne Kuster recommends that you continue on your current medications as directed. Please refer to the Current Medication list given to you today.  Labwork: NONE ORDERED  Testing/Procedures: 1. Echocardiogram - Your physician has requested that you have an echocardiogram. Echocardiography is a painless test that uses sound waves to create images of your heart. It provides your doctor with information about the size and shape of your heart and how well your heart's chambers and valves are working. This procedure takes approximately one hour. There are no restrictions for this procedure.  2. Exercise Tolerance Test - Your physician has requested that you have an exercise tolerance test. For further information please visit [email or web address]. Please also follow instruction sheet, as given.  >>These tests will be performed at our [facility] location [place], [place] [address] [phone]  Follow-up: Dr Sallyanne Kuster recommends that you schedule a follow-up appointment after tests are completed.  If you need a refill on your cardiac medications before your next appointment, please call your pharmacy.

## 2017-12-20 NOTE — Progress Notes (Signed)
Cardiology Office Note:    Date:  12/22/2017   ID:  Antonio Manning, DOB 17-Mar-1941, MRN [id number]  PCP:  Hulan Fess, MD  Cardiologist:  No primary care provider on file.   Referring MD: Hulan Fess, MD   Chief Complaint  Patient presents with  . Atrial Fibrillation  . Shortness of Breath    History of Present Illness:    Antonio Manning is a 77 y.o. male with a hx of atrial fibrillation who presents with complaints of increasing episodes of palpitations as well as exertional fatigue and dyspnea.  He has noticed that he has increased burden of atrial fibrillation and physical activity, example when he was removing heavy Christmas trees this year.  Also complains of having reduced stamina and become short of breath if he is walking uphill.  He is particularly tired in the morning.  However, it is important to note that he still works 6 days a week and he knows approximately 6 lawns every day.  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  He told me that "I always feel better when I am working".  He does not give a lot of symptoms of depression.  His wife reports that he snores only if he sleeps on his back, although she has had some concern about possible sleep apnea for many years.  He is quite lean and does not have the typical phenotype for sleep apnea.  He does not seem to have daytime hypersomnolence.  He presented with a cryptogenic stroke and received an implantable loop recorder about 2 years ago (after which he was diagnosed as having atrial fibrillation based on the recorder transmissions).  He is never aware of the palpitations, but has noticed retrospectively that the episodes seem to be frequently associated with intense physical activity.  His loop recorder has also shown episodes of atrial flutter (which is actually more commonly atrial fibrillation).  The overall burden of atrial arrhythmia is low around 0.5% he had normal left ventricular systolic  function on echocardiogram performed 2 years ago.  He is compliant with anticoagulation and has not had problems with bleeding, falls or any new neurological events since his initial presentation.    He does have a history of smoking 1 pack a day for about 20 years but quit 30 years ago.  His chest x-ray earlier this year did show chronic changes consistent with COPD.  Recent hemoglobin was borderline decreased at 11.8 with normocytic indices I do not think he had a recent TSH.  Past Medical History:  Diagnosis Date  . Arthritis 05/15/2011   osteoarthritis,lt. hip, and neck area  . Cancer ([place]) May 15, 2011   skin cancer lesions-multiple  . Cataract 15-May-2011   right  . Cholesterol serum elevated 05/15/2011   tx. Lipitor  . Expressive aphasia 10/2015  . Hearing loss 05-15-2011   wears hearing aids bilaterally  . Kidney calculi 05-15-11   past hx. x1-passed on own, mild prostate issues-no meds  . PONV (postoperative nausea and vomiting) May 15, 2011   with cataract surgery  . Stroke [facility]) 10/2015    Past Surgical History:  Procedure Laterality Date  . BACK SURGERY  May 15, 2011   Lumbar disc surgery-no hardware  . CATARACT EXTRACTION W/ INTRAOCULAR LENS IMPLANT  05/15/2011   left eye  . EP IMPLANTABLE DEVICE N/A 10/29/2015   Procedure: Loop Recorder Insertion;  Surgeon: Evans Lance, MD;  Location: [place] [facility];  Service: Cardiovascular;  Laterality: N/A;  . EYE SURGERY     eye lid lift   . I&D EXTREMITY Right 01/17/2016   Procedure: IRRIGATION AND DEBRIDEMENT LEG LACERATION;  Surgeon: Vickey Huger, MD;  Location: [place];  Service: Orthopedics;  Laterality: Right;  . TEE WITHOUT CARDIOVERSION N/A 10/29/2015   Procedure: TRANSESOPHAGEAL ECHOCARDIOGRAM (TEE);  Surgeon: Larey Dresser, MD;  Location: [place];  Service: Cardiovascular;  Laterality: N/A;  . TOTAL HIP ARTHROPLASTY  05/20/2011   Procedure: TOTAL HIP ARTHROPLASTY ANTERIOR APPROACH;  Surgeon: Mauri Pole, MD;  [facility]: [facility];  Service:  Orthopedics;  Laterality: Left;    Current Medications: Current Meds  Medication Sig  . acetaminophen (TYLENOL) 500 MG tablet Take 1,000 mg by mouth every 6 (six) hours as needed (pain).  Marland Kitchen atorvastatin (LIPITOR) 20 MG tablet Take 20 mg by mouth at bedtime.   . Calcium Carbonate-Vitamin D (CALCIUM + D PO) Take 1 tablet by mouth at bedtime.   Marland Kitchen ELIQUIS 5 MG TABS tablet TAKE 1 TABLET BY MOUTH TWICE A DAY  . Multiple Vitamin (MULITIVITAMIN WITH MINERALS) TABS Take 1 tablet by mouth at bedtime.      Allergies:   Codeine   Social History   Socioeconomic History  . Marital status: Married    Spouse name: Not on file  . Number of children: 3  . Years of education: 19  . Highest education level: Not on file  Occupational History  . 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 Financial resource strain: Not on file  . Food insecurity:    Worry: Not on file    Inability: Not on file  . Transportation needs:    Medical: Not on file    Non-medical: Not on file  Tobacco Use  . Smoking status: Former Smoker    Years: 25.00    Types: Cigarettes    Last attempt to quit: 05/12/1988    Years since quitting: 29.6  . Smokeless tobacco: Never Used  Substance and Sexual Activity  . Alcohol use: No  . Drug use: No  . Sexual activity: Yes  Lifestyle  . Physical activity:    Days per week: Not on file    Minutes per session: Not on file  . Stress: Not on file  Relationships  . Social connections:    Talks on phone: Not on file    Gets together: Not on file    Attends religious service: Not on file    Active member of club or organization: Not on file    Attends meetings of clubs or organizations: Not on file    Relationship status: Not on file  Other Topics Concern  . Not on file  Social History Narrative  . Not on file     Family History: The patient's family history includes Birth defects in his child; Cancer in his brother, brother, and father; Heart Problems in his  brother, brother, mother, and sister; Hyperlipidemia in his sister; Other in his child; Stroke in his brother.  ROS:   Please see the history of present illness.     All other systems reviewed and are negative.  EKGs/Labs/Other Studies Reviewed:    The following studies were reviewed today: Loop recorder balance, echocardiogram, office notes from Dr. Lovena Le  EKG:  EKG is  ordered today.  The ekg ordered today demonstrates sinus rhythm with frequent PVCs and  Recent Labs: No results found for requested labs within last 8760 hours.  Recent Lipid Panel  Component Value Date/Time   CHOL 151 10/27/2015 0831   TRIG 66 10/27/2015 0831   HDL 50 10/27/2015 0831   CHOLHDL 3.0 10/27/2015 0831   VLDL 13 10/27/2015 0831   LDLCALC 88 10/27/2015 0831    Physical Exam:    VS:  BP 106/70 (BP Location: Left Arm, Patient Position: Sitting, Cuff Size: Normal)   Pulse 62   Ht 6' (1.829 m)   Wt 160 lb (72.6 kg)   BMI 21.70 kg/m     Wt Readings from Last 3 Encounters:  12/20/17 160 lb (72.6 kg)  07/01/17 159 lb (72.1 kg)  01/25/17 161 lb 3.2 oz (73.1 kg)     GEN: Lean and fit, looks younger than stated age well nourished, well developed in no acute distress HEENT: Normal NECK: No JVD; No carotid bruits LYMPHATICS: No lymphadenopathy CARDIAC: Rare ectopy on a background of RRR, no murmurs, rubs, gallops RESPIRATORY:  Clear to auscultation without rales, wheezing or rhonchi  ABDOMEN: Soft, non-tender, non-distended MUSCULOSKELETAL:  No edema; No deformity  SKIN: Warm and dry NEUROLOGIC:  Alert and oriented x 3 PSYCHIATRIC:  Normal affect   ASSESSMENT:    1. Paroxysmal atrial fibrillation (HCC)   2. Long term (current) use of anticoagulants   3. Shortness of breath   4. Fatigue, unspecified type    PLAN:    In order of problems listed above:  1. AFib: The overall burden appears to be very low and makes it less likely that atrial fibrillation can be responsible for all of his  complaints.  However it is possible that exertion his atrial fibrillation could explain some of his issues.  We will see if we can provoke atrial fibrillation during a treadmill stress test.  He is compliant with anticoagulation. 2. Eliquis: Well-tolerated with no bleeding problems. CHADSVasc 4 (age 68, CVA 2). 3. Exertional dyspnea:.  Pattern is a little atypical he always feels worse first thing in the morning and feels better when he is at work.  Mowing or using the weedeater does not make him short of breath, but climbing a hill does.  We will check a treadmill stress test and repeat his echocardiogram. 4. Fatigue: His anemia is too mild to explain this.  I wonder about nondiagnostic sleep apnea or even depression.  Neither one of those diagnoses appears very likely, after spending some time talking with him and his wife.  However they should be evaluated if we find no explanation on his treadmill/echo work-up.   Medication Adjustments/Labs and Tests Ordered: Current medicines are reviewed at length with the patient today.  Concerns regarding medicines are outlined above.  Orders Placed This Encounter  Procedures  . EXERCISE TOLERANCE TEST (ETT)  . EKG 12-Lead  . ECHOCARDIOGRAM COMPLETE   No orders of the defined types were placed in this encounter.   Patient Instructions  Medication Instructions: Dr Sallyanne Kuster recommends that you continue on your current medications as directed. Please refer to the Current Medication list given to you today.  Labwork: NONE ORDERED  Testing/Procedures: 1. Echocardiogram - Your physician has requested that you have an echocardiogram. Echocardiography is a painless test that uses sound waves to create images of your heart. It provides your doctor with information about the size and shape of your heart and how well your heart's chambers and valves are working. This procedure takes approximately one hour. There are no restrictions for this procedure.  2.  Exercise Tolerance Test - Your physician has requested that you have an  exercise tolerance test. For further information please visit [email or web address]. Please also follow instruction sheet, as given.  >>These tests will be performed at our [facility] location [place], [place] [address] [phone]  Follow-up: Dr Sallyanne Kuster recommends that you schedule a follow-up appointment after tests are completed.  If you need a refill on your cardiac medications before your next appointment, please call your pharmacy.    Signed, Sanda Klein, MD  12/22/2017 1:44 PM    [place]

## 2017-12-22 ENCOUNTER — Encounter: Payer: Self-pay | Admitting: Cardiovascular Disease

## 2017-12-27 ENCOUNTER — Ambulatory Visit (HOSPITAL_COMMUNITY): Payer: Medicare Other | Attending: Cardiovascular Disease

## 2017-12-27 ENCOUNTER — Ambulatory Visit (INDEPENDENT_AMBULATORY_CARE_PROVIDER_SITE_OTHER): Payer: Medicare Other

## 2017-12-27 ENCOUNTER — Other Ambulatory Visit: Payer: Self-pay | Admitting: Internal Medicine

## 2017-12-27 ENCOUNTER — Other Ambulatory Visit: Payer: Self-pay

## 2017-12-27 DIAGNOSIS — R0602 Shortness of breath: Secondary | ICD-10-CM | POA: Insufficient documentation

## 2017-12-27 DIAGNOSIS — I340 Nonrheumatic mitral (valve) insufficiency: Secondary | ICD-10-CM | POA: Insufficient documentation

## 2017-12-27 LAB — EXERCISE TOLERANCE TEST
CSEPED: 6 min
CSEPEW: 5.7 METS
CSEPHR: 92 %
Exercise duration (sec): 10 s
MPHR: 141 {beats}/min
Peak HR: 130 {beats}/min
RPE: 15
Rest HR: 66 {beats}/min

## 2017-12-27 NOTE — Telephone Encounter (Signed)
Eliquis 5mg  refill request received; pt is 77 yrs old, wt-72.6kg, Crea-0.80 via PCP at  [facility] on 12/15/17, last seen by Dr. Sallyanne Kuster on 12/20/17; will send in refill to requested pharmacy.

## 2018-01-06 ENCOUNTER — Ambulatory Visit (INDEPENDENT_AMBULATORY_CARE_PROVIDER_SITE_OTHER): Payer: Medicare Other | Admitting: *Deleted

## 2018-01-06 DIAGNOSIS — D649 Anemia, unspecified: Secondary | ICD-10-CM | POA: Diagnosis not present

## 2018-01-06 DIAGNOSIS — R748 Abnormal levels of other serum enzymes: Secondary | ICD-10-CM | POA: Diagnosis not present

## 2018-01-06 DIAGNOSIS — I480 Paroxysmal atrial fibrillation: Secondary | ICD-10-CM

## 2018-01-06 DIAGNOSIS — Z79899 Other long term (current) drug therapy: Secondary | ICD-10-CM | POA: Diagnosis not present

## 2018-01-06 NOTE — Progress Notes (Signed)
[facility] Summary Report / Loop Recorder 

## 2018-01-17 LAB — CUP PACEART REMOTE DEVICE CHECK
Date Time Interrogation Session: 20190804153904
Implantable Pulse Generator Implant Date: 20170628

## 2018-01-27 LAB — CUP PACEART REMOTE DEVICE CHECK
Implantable Pulse Generator Implant Date: 20170628
MDC IDC SESS DTM: 20190906153658

## 2018-02-08 ENCOUNTER — Ambulatory Visit (INDEPENDENT_AMBULATORY_CARE_PROVIDER_SITE_OTHER): Payer: Medicare Other | Admitting: *Deleted

## 2018-02-08 DIAGNOSIS — I480 Paroxysmal atrial fibrillation: Secondary | ICD-10-CM | POA: Diagnosis not present

## 2018-02-09 NOTE — Progress Notes (Signed)
[facility] Summary Report / Loop Recorder 

## 2018-02-23 LAB — CUP PACEART REMOTE DEVICE CHECK
MDC IDC PG IMPLANT DT: 20170628
MDC IDC SESS DTM: 20191009153946

## 2018-03-06 DIAGNOSIS — L812 Freckles: Secondary | ICD-10-CM | POA: Diagnosis not present

## 2018-03-06 DIAGNOSIS — Z85828 Personal history of other malignant neoplasm of skin: Secondary | ICD-10-CM | POA: Diagnosis not present

## 2018-03-06 DIAGNOSIS — L820 Inflamed seborrheic keratosis: Secondary | ICD-10-CM | POA: Diagnosis not present

## 2018-03-06 DIAGNOSIS — L570 Actinic keratosis: Secondary | ICD-10-CM | POA: Diagnosis not present

## 2018-03-06 DIAGNOSIS — D1801 Hemangioma of skin and subcutaneous tissue: Secondary | ICD-10-CM | POA: Diagnosis not present

## 2018-03-06 DIAGNOSIS — D692 Other nonthrombocytopenic purpura: Secondary | ICD-10-CM | POA: Diagnosis not present

## 2018-03-06 DIAGNOSIS — L821 Other seborrheic keratosis: Secondary | ICD-10-CM | POA: Diagnosis not present

## 2018-03-07 ENCOUNTER — Ambulatory Visit (INDEPENDENT_AMBULATORY_CARE_PROVIDER_SITE_OTHER): Payer: Medicare Other | Admitting: Cardiovascular Disease

## 2018-03-07 ENCOUNTER — Encounter: Payer: Self-pay | Admitting: Cardiovascular Disease

## 2018-03-07 VITALS — BP 142/66 | HR 68 | Ht 72.0 in | Wt 155.2 lb

## 2018-03-07 DIAGNOSIS — I700 Atherosclerosis of aorta: Secondary | ICD-10-CM

## 2018-03-07 DIAGNOSIS — Z9889 Other specified postprocedural states: Secondary | ICD-10-CM

## 2018-03-07 DIAGNOSIS — R5383 Other fatigue: Secondary | ICD-10-CM

## 2018-03-07 DIAGNOSIS — Z7901 Long term (current) use of anticoagulants: Secondary | ICD-10-CM | POA: Diagnosis not present

## 2018-03-07 DIAGNOSIS — I4892 Unspecified atrial flutter: Secondary | ICD-10-CM | POA: Diagnosis not present

## 2018-03-07 NOTE — Progress Notes (Signed)
Cardiology Office Note:    Date:  03/12/2018   ID:  Antonio Manning, DOB 1941-04-18, MRN [id number]  PCP:  Hulan Fess, MD  Cardiologist:  Sanda Klein, MD   Referring MD: Hulan Fess, MD   Chief Complaint  Patient presents with  . Atrial Fibrillation  . Follow-up    Echo and stress test    History of Present Illness:    Antonio Manning is a 77 y.o. male with a hx of atrial fibrillation who presents in follow-up after undergoing echo and treadmill stress testing for complaints of exertional fatigue and dyspnea.    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  His symptoms of fatigue always get better when he works.  He still has occasional "weekdays" that they last for a few hours up to half a day.  He cannot identify a trigger or cause.  He notes one episode happened after he drank cranberry juice.  He complains of nocturia.  There is no correlation between his symptoms and the episodes of atrial fibrillation recorded by his device.  His echocardiogram showed mostly normal findings.  The left ventricular ejection fraction is 55-60%.  There was "abnormal relaxation" but no elevation in filling pressures.  She had mild mitral valve prolapse with mild mitral regurgitation.  The left atrium measured 3.7 cm.  The ascending aorta was borderline dilated at 4.1 cm.  He did reasonably well on the treadmill stress test.  He exercised for over 6 minutes without angina, dyspnea, ST segment deviation or arrhythmia.  Loop recorder interrogation continues to show a very low burden of paroxysmal atrial fibrillation and only 0.1%.  His hemoglobin on August 19 was 11.8.  He presented with a cryptogenic stroke and received an implantable loop recorder about 2 years ago (after which he was diagnosed as having atrial fibrillation based on the  recorder transmissions).  He is never aware of the palpitations, but has noticed retrospectively that the episodes seem to be frequently associated with intense physical activity.  His loop recorder has also shown episodes of atrial flutter.  He is compliant with anticoagulation and has not had problems with bleeding, falls or any new neurological events since his initial presentation.    He does have a history of smoking 1 pack a day for about 20 years but quit 30 years ago.  His chest x-ray earlier this year did show chronic changes consistent with COPD.   Past Medical History:  Diagnosis Date  . Arthritis 05/26/11   osteoarthritis,lt. hip, and neck area  . Cancer ([place]) 05/26/11   skin cancer lesions-multiple  . Cataract 2011-05-26   right  . Cholesterol serum elevated 05/26/2011   tx. Lipitor  . Expressive aphasia 10/2015  . Hearing loss 2011-05-26   wears hearing aids bilaterally  . Kidney calculi 05-26-2011   past hx. x1-passed on own, mild prostate issues-no meds  . PONV (postoperative nausea and vomiting) May 26, 2011   with cataract surgery  . Stroke [facility]) 10/2015    Past Surgical History:  Procedure Laterality Date  . BACK SURGERY  May 26, 2011   Lumbar disc surgery-no hardware  . CATARACT EXTRACTION W/ INTRAOCULAR LENS IMPLANT  05/26/11   left eye  . EP IMPLANTABLE DEVICE N/A 10/29/2015   Procedure: Loop Recorder Insertion;  Surgeon: Evans Lance, MD;  Location: [place]  [facility];  Service: Cardiovascular;  Laterality: N/A;  . EYE SURGERY     eye lid lift   . I&D EXTREMITY Right 01/17/2016   Procedure: IRRIGATION AND DEBRIDEMENT LEG LACERATION;  Surgeon: Vickey Huger, MD;  Location: [place];  Service: Orthopedics;  Laterality: Right;  . TEE WITHOUT CARDIOVERSION N/A 10/29/2015   Procedure: TRANSESOPHAGEAL ECHOCARDIOGRAM (TEE);  Surgeon: Larey Dresser, MD;  Location: [place];  Service: Cardiovascular;  Laterality: N/A;  . TOTAL HIP ARTHROPLASTY  05/20/2011   Procedure: TOTAL HIP  ARTHROPLASTY ANTERIOR APPROACH;  Surgeon: Mauri Pole, MD;  [facility];  Service: Orthopedics;  Laterality: Left;    Current Medications: Current Meds  Medication Sig  . atorvastatin (LIPITOR) 20 MG tablet Take 20 mg by mouth at bedtime.   . Calcium Carbonate-Vitamin D (CALCIUM + D PO) Take 1 tablet by mouth at bedtime.   Marland Kitchen co-enzyme Q-10 50 MG capsule Take 2 capsules (100 mg total) by mouth daily.  Marland Kitchen ELIQUIS 5 MG TABS tablet TAKE 1 TABLET BY MOUTH TWICE A DAY  . Multiple Vitamin (MULITIVITAMIN WITH MINERALS) TABS Take 1 tablet by mouth at bedtime.      Allergies:   Codeine   Social History   Socioeconomic History  . Marital status: Married    Spouse name: Not on file  . Number of children: 3  . Years of education: 80  . Highest education level: Not on file  Occupational History  . 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 Financial resource strain: Not on file  . Food insecurity:    Worry: Not on file    Inability: Not on file  . Transportation needs:    Medical: Not on file    Non-medical: Not on file  Tobacco Use  . Smoking status: Former Smoker    Years: 25.00    Types: Cigarettes    Last attempt to quit: 05/12/1988    Years since quitting: 29.8  . Smokeless tobacco: Never Used  Substance and Sexual Activity  . Alcohol use: No  . Drug use: No  . Sexual activity: Yes  Lifestyle  . Physical activity:    Days per week: Not on file    Minutes per session: Not on file  . Stress: Not on file  Relationships  . Social connections:    Talks on phone: Not on file    Gets together: Not on file    Attends religious service: Not on file    Active member of club or organization: Not on file    Attends meetings of clubs or organizations: Not on file    Relationship status: Not on file  Other Topics Concern  . Not on file  Social History Narrative  . Not on file     Family History: The patient's family history includes Birth defects in his child;  Cancer in his brother, brother, and father; Heart Problems in his brother, brother, mother, and sister; Hyperlipidemia in his sister; Other in his child; Stroke in his brother.  ROS:   Please see the history of present illness.     All other systems reviewed and are negative.  EKGs/Labs/Other Studies Reviewed:    The following studies were reviewed today: Loop recorder download, echocardiogram, stress test  EKG:  EKG is not ordered today.    Recent Labs: No results found for requested labs within last 8760 hours.  Recent Lipid Panel    Component Value Date/Time   CHOL 151  10/27/2015 0831   TRIG 66 10/27/2015 0831   HDL 50 10/27/2015 0831   CHOLHDL 3.0 10/27/2015 0831   VLDL 13 10/27/2015 0831   LDLCALC 88 10/27/2015 0831    Physical Exam:    VS:  BP (!) 142/66   Pulse 68   Ht 6' (1.829 m)   Wt 155 lb 3.2 oz (70.4 kg)   SpO2 96%   BMI 21.05 kg/m     Wt Readings from Last 3 Encounters:  03/07/18 155 lb 3.2 oz (70.4 kg)  12/20/17 160 lb (72.6 kg)  07/01/17 159 lb (72.1 kg)     General: Alert, oriented x3, no distress, he appears lean and fit and younger than his stated age Head: no evidence of trauma, PERRL, EOMI, no exophtalmos or lid lag, no myxedema, no xanthelasma; normal ears, nose and oropharynx Neck: normal jugular venous pulsations and no hepatojugular reflux; brisk carotid pulses without delay and no carotid bruits Chest: clear to auscultation, no signs of consolidation by percussion or palpation, normal fremitus, symmetrical and full respiratory excursions Cardiovascular: normal position and quality of the apical impulse, regular rhythm, normal first and second heart sounds, no murmurs, rubs or gallops Abdomen: no tenderness or distention, no masses by palpation, no abnormal pulsatility or arterial bruits, normal bowel sounds, no hepatosplenomegaly Extremities: no clubbing, cyanosis or edema; 2+ radial, ulnar and brachial pulses bilaterally; 2+ right femoral,  posterior tibial and dorsalis pedis pulses; 2+ left femoral, posterior tibial and dorsalis pedis pulses; no subclavian or femoral bruits Neurological: grossly nonfocal Psych: Normal mood and affect   ASSESSMENT:    1. Paroxysmal atrial flutter ([place])   2. Long term current use of anticoagulant   3. Fatigue, unspecified type   4. History of loop recorder   5. Thoracic aorta atherosclerosis ([place])    PLAN:    In order of problems listed above:  1. AFib/flutter: The overall burden appears to be very low, does not correlate with his symptoms and could not be induced with physical activity.  It is unlikely to be responsible for his complaint. 2. Eliquis: Well-tolerated with no bleeding problems. CHADSVasc 4 (age 80, CVA 2).  He is not anemic. 3. Exertional dyspnea/fatigue: Exercise tolerance is pretty good at almost 77 year old.  No evidence of meaningful structural heart disease.  No evidence of exercise-induced ischemia. 4. ILR: No significant bradycardia recorded by his loop recorder.  Normal chronotropic response during exercise.  5. Ao atherosclerosis: On statin.  Unable to find a structural or functional cardiac cause for his complaints.  He seems satisfied with the work-up and does not want to undergo more invasive testing.  I asked him to use his loop recorder activator to mark episodes of symptoms.   Medication Adjustments/Labs and Tests Ordered: Current medicines are reviewed at length with the patient today.  Concerns regarding medicines are outlined above.  No orders of the defined types were placed in this encounter.  No orders of the defined types were placed in this encounter.   Patient Instructions  Medication Instructions:  Dr Sallyanne Kuster recommends that you continue on your current medications as directed. Please refer to the Current Medication list given to you today.  If you need a refill on your cardiac medications before your next appointment, please call your  pharmacy.   Follow-Up: At [facility], you and your health needs are our priority.  As part of our continuing mission to provide you with exceptional heart care, we have created designated Provider Care Teams.  These Care Teams include your primary Cardiologist (physician) and Advanced Practice Providers (APPs -  Physician Assistants and Nurse Practitioners) who all work together to provide you with the care you need, when you need it. You will need a follow up appointment in 12 months.  Please call our office 2 months in advance to schedule this appointment.  You may see Sanda Klein, MD or one of the following Advanced Practice Providers on your designated Care Team: [place], [place] . Fabian Sharp, PA-C    Signed, Sanda Klein, MD  03/12/2018 11:04 AM    [place]

## 2018-03-07 NOTE — Patient Instructions (Addendum)
Medication Instructions:  Dr Croitoru recommends that you continue on your current medications as directed. Please refer to the Current Medication list given to you today.  If you need a refill on your cardiac medications before your next appointment, please call your pharmacy.   Follow-Up: At [facility], you and your health needs are our priority.  As part of our continuing mission to provide you with exceptional heart care, we have created designated Provider Care Teams.  These Care Teams include your primary Cardiologist (physician) and Advanced Practice Providers (APPs -  Physician Assistants and Nurse Practitioners) who all work together to provide you with the care you need, when you need it. You will need a follow up appointment in 12 months.  Please call our office 2 months in advance to schedule this appointment.  You may see Mihai Croitoru, MD or one of the following Advanced Practice Providers on your designated Care Team: Hao Meng, PA-C . Angela Duke, PA-C 

## 2018-03-12 ENCOUNTER — Encounter: Payer: Self-pay | Admitting: Cardiovascular Disease

## 2018-03-12 DIAGNOSIS — Z7901 Long term (current) use of anticoagulants: Secondary | ICD-10-CM | POA: Insufficient documentation

## 2018-03-13 ENCOUNTER — Ambulatory Visit (INDEPENDENT_AMBULATORY_CARE_PROVIDER_SITE_OTHER): Payer: Medicare Other | Admitting: *Deleted

## 2018-03-13 DIAGNOSIS — I480 Paroxysmal atrial fibrillation: Secondary | ICD-10-CM | POA: Diagnosis not present

## 2018-03-14 NOTE — Progress Notes (Signed)
[facility] Summary Report / Loop Recorder 

## 2018-04-14 ENCOUNTER — Ambulatory Visit (INDEPENDENT_AMBULATORY_CARE_PROVIDER_SITE_OTHER): Payer: Medicare Other

## 2018-04-14 DIAGNOSIS — I480 Paroxysmal atrial fibrillation: Secondary | ICD-10-CM | POA: Diagnosis not present

## 2018-04-17 NOTE — Progress Notes (Signed)
[facility] Summary Report / Loop Recorder 

## 2018-05-02 LAB — CUP PACEART REMOTE DEVICE CHECK
Implantable Pulse Generator Implant Date: 20170628
MDC IDC SESS DTM: 20191111163938

## 2018-05-17 ENCOUNTER — Ambulatory Visit (INDEPENDENT_AMBULATORY_CARE_PROVIDER_SITE_OTHER): Payer: Medicare Other

## 2018-05-17 DIAGNOSIS — I480 Paroxysmal atrial fibrillation: Secondary | ICD-10-CM

## 2018-05-18 NOTE — Progress Notes (Signed)
[facility] Summary Report / Loop Recorder 

## 2018-05-19 LAB — CUP PACEART REMOTE DEVICE CHECK
Date Time Interrogation Session: 20200116170702
Implantable Pulse Generator Implant Date: 20170628

## 2018-05-21 LAB — CUP PACEART REMOTE DEVICE CHECK
Date Time Interrogation Session: 20191214163614
MDC IDC PG IMPLANT DT: 20170628

## 2018-06-14 ENCOUNTER — Telehealth: Payer: Self-pay

## 2018-06-14 NOTE — Telephone Encounter (Signed)
I spoke with pt wife and she agreed to have the pt send a manual transmission with his home monitor.

## 2018-06-16 ENCOUNTER — Telehealth: Payer: Self-pay | Admitting: *Deleted

## 2018-06-16 DIAGNOSIS — R945 Abnormal results of liver function studies: Secondary | ICD-10-CM | POA: Diagnosis not present

## 2018-06-16 DIAGNOSIS — I480 Paroxysmal atrial fibrillation: Secondary | ICD-10-CM | POA: Diagnosis not present

## 2018-06-16 DIAGNOSIS — Z86010 Personal history of colonic polyps: Secondary | ICD-10-CM | POA: Diagnosis not present

## 2018-06-16 DIAGNOSIS — Z800 Family history of malignant neoplasm of digestive organs: Secondary | ICD-10-CM | POA: Diagnosis not present

## 2018-06-16 DIAGNOSIS — K625 Hemorrhage of anus and rectum: Secondary | ICD-10-CM | POA: Diagnosis not present

## 2018-06-16 DIAGNOSIS — R194 Change in bowel habit: Secondary | ICD-10-CM | POA: Diagnosis not present

## 2018-06-16 NOTE — Telephone Encounter (Signed)
   Primary Cardiologist:Mihai Croitoru, MD  Chart reviewed as part of pre-operative protocol coverage.   I called Mr Lighty on 06/16/18. At that time, his wife relayed to me that he was having issues with chest pain and shortness of breath.   Because of Abdiel Blackerby Bembenek's past medical history and time since last visit, he/she will require a follow-up visit in order to better assess preoperative cardiovascular risk.  Pre-op covering staff: - Please schedule appointment and call patient to inform them. - Please contact requesting surgeon's office via preferred method (i.e, phone, fax) to inform them of need for appointment prior to surgery.  If applicable, this message will also be routed to pharmacy pool and/or primary cardiologist for input on holding anticoagulant/antiplatelet agent as requested below so that this information is available at time of patient's appointment.   Please call on Monday using the home phone: [phone].  Tami Lin Duke, PA  06/16/2018, 5:10 PM

## 2018-06-16 NOTE — Telephone Encounter (Signed)
°  Pre op team unavailable  Please return call to patient

## 2018-06-16 NOTE — Telephone Encounter (Signed)
Left vm to call back

## 2018-06-16 NOTE — Telephone Encounter (Signed)
Pt takes Eliquis for afib with CHADS2VASc score of 5 (age x2, CAD, CVA). Renal function is normal. Recommend only holding Eliquis for 1 day prior to procedure to history of CVA.

## 2018-06-16 NOTE — Telephone Encounter (Signed)
   [facility] Pre-operative Risk Assessment    Request for surgical clearance:  1. What type of surgery is being performed? COLONOSCOPY   2. When is this surgery scheduled? 07/26/18   3. What type of clearance is required (medical clearance vs. Pharmacy clearance to hold med vs. Both)? BOTH  4. Are there any medications that need to be held prior to surgery and how long?ELIQUIS   5. Practice name and name of physician performing surgery? [facility]; DR. Luan Pulling   6. What is your office phone number [phone]    7.   What is your office fax number [phone]  8.   Anesthesia type (None, local, MAC, general) ? PROPOFOL    Julaine Hua 06/16/2018, 3:11 PM  _________________________________________________________________   (provider comments below)

## 2018-06-19 NOTE — Telephone Encounter (Signed)
Transmission received inappropriate pause episode 06/13/2018 @ 3:31am

## 2018-06-19 NOTE — Telephone Encounter (Signed)
Pt's wife (DPR on file) call back to schedule pt's appt for pre-op. Appointment has be made for 3/5 @ 3:30 pm with Kerin Ransom, PA. Pt's wife will make pt aware of scheduled appt time and date. Will fax over pre-op clearance notes to requesting surgeon's office.

## 2018-06-19 NOTE — Telephone Encounter (Signed)
Spoke with pt's wife (DPR on file) who states that she will call our office back to schedule an appointment for pre-operative clearance.

## 2018-06-20 ENCOUNTER — Ambulatory Visit (INDEPENDENT_AMBULATORY_CARE_PROVIDER_SITE_OTHER): Payer: Medicare Other

## 2018-06-20 DIAGNOSIS — I480 Paroxysmal atrial fibrillation: Secondary | ICD-10-CM

## 2018-06-21 LAB — CUP PACEART REMOTE DEVICE CHECK
MDC IDC PG IMPLANT DT: 20170628
MDC IDC SESS DTM: 20200218170930

## 2018-06-22 ENCOUNTER — Other Ambulatory Visit: Payer: Self-pay | Admitting: Gastroenterology

## 2018-06-22 DIAGNOSIS — R194 Change in bowel habit: Secondary | ICD-10-CM

## 2018-06-22 DIAGNOSIS — R7989 Other specified abnormal findings of blood chemistry: Secondary | ICD-10-CM

## 2018-06-22 DIAGNOSIS — R945 Abnormal results of liver function studies: Secondary | ICD-10-CM

## 2018-06-28 NOTE — Progress Notes (Signed)
Remote ICD transmission.   

## 2018-07-03 ENCOUNTER — Ambulatory Visit
Admission: RE | Admit: 2018-07-03 | Discharge: 2018-07-03 | Disposition: A | Discharge status: Home / Self Care | Payer: Medicare Other | Source: Ambulatory Visit | Attending: Gastroenterology | Admitting: Gastroenterology

## 2018-07-03 DIAGNOSIS — R7989 Other specified abnormal findings of blood chemistry: Secondary | ICD-10-CM

## 2018-07-03 DIAGNOSIS — R945 Abnormal results of liver function studies: Secondary | ICD-10-CM

## 2018-07-03 DIAGNOSIS — K7689 Other specified diseases of liver: Secondary | ICD-10-CM | POA: Diagnosis not present

## 2018-07-03 DIAGNOSIS — R194 Change in bowel habit: Secondary | ICD-10-CM

## 2018-07-03 MED ORDER — IOPAMIDOL (ISOVUE-300) INJECTION 61%
100.0000 mL | Freq: Once | INTRAVENOUS | Status: AC | PRN
  Administered 2018-07-03: 100 mL via INTRAVENOUS

## 2018-07-05 ENCOUNTER — Other Ambulatory Visit (HOSPITAL_COMMUNITY): Payer: Self-pay | Admitting: Gastroenterology

## 2018-07-05 DIAGNOSIS — K769 Liver disease, unspecified: Secondary | ICD-10-CM

## 2018-07-06 ENCOUNTER — Ambulatory Visit (INDEPENDENT_AMBULATORY_CARE_PROVIDER_SITE_OTHER): Payer: Medicare Other | Admitting: Cardiology

## 2018-07-06 VITALS — BP 122/90 | HR 60 | Ht 72.0 in | Wt 152.8 lb

## 2018-07-06 DIAGNOSIS — I480 Paroxysmal atrial fibrillation: Secondary | ICD-10-CM | POA: Diagnosis not present

## 2018-07-06 DIAGNOSIS — Z8673 Personal history of transient ischemic attack (TIA), and cerebral infarction without residual deficits: Secondary | ICD-10-CM

## 2018-07-06 DIAGNOSIS — Z7901 Long term (current) use of anticoagulants: Secondary | ICD-10-CM | POA: Diagnosis not present

## 2018-07-06 DIAGNOSIS — Z01810 Encounter for preprocedural cardiovascular examination: Secondary | ICD-10-CM | POA: Diagnosis not present

## 2018-07-06 DIAGNOSIS — K769 Liver disease, unspecified: Secondary | ICD-10-CM | POA: Diagnosis not present

## 2018-07-06 NOTE — Progress Notes (Signed)
07/06/2018 Antonio Manning   1940/06/27  [id number]  Primary Physician Hulan Fess, MD Primary Cardiologist: Dr Sallyanne Kuster  HPI: Antonio Manning is a pleasant 78 year old male with a history of a cryptogenic stroke some years ago.  He was subsequently diagnosed with PAF.  He has had a loop recorder placed.  Dr. Sallyanne Kuster feels his atrial fibrillation burden is low.  Patient is on Eliquis.  The patient had an exercise tolerance test 12/27/2017 that showed no ischemia.  Echocardiogram done 12/27/2017 showed preserved LV function with grade 1 diastolic dysfunction.  Patient is in the office today for preop clearance prior to a colonoscopy.  The request was sent out February 14.  In the interim as CT scan done 07/03/2018 showed multiple liver lesions and renal lesion suspicious for cancer.  The patient is supposed to be contacted about a biopsy.  He does admit to fatigue with exertion but this is been chronic.   Current Outpatient Medications  Medication Sig Dispense Refill  . atorvastatin (LIPITOR) 40 MG tablet     . Calcium Carbonate-Vitamin D (CALCIUM + D PO) Take 1 tablet by mouth at bedtime.     Marland Kitchen co-enzyme Q-10 50 MG capsule Take 2 capsules (100 mg total) by mouth daily. 60 capsule 3  . ELIQUIS 5 MG TABS tablet TAKE 1 TABLET BY MOUTH TWICE A DAY 180 tablet 2  . Multiple Vitamin (MULITIVITAMIN WITH MINERALS) TABS Take 1 tablet by mouth at bedtime.     . fluticasone (FLONASE) 50 MCG/ACT nasal spray      No current facility-administered medications for this visit.     Allergies  Allergen Reactions  . Codeine Nausea And Vomiting    Past Medical History:  Diagnosis Date  . Arthritis 2011/05/20   osteoarthritis,lt. hip, and neck area  . Cancer ([place]) 20-May-2011   skin cancer lesions-multiple  . Cataract 2011/05/20   right  . Cholesterol serum elevated 05/20/2011   tx. Lipitor  . Expressive aphasia 10/2015  . Hearing loss May 20, 2011   wears hearing aids bilaterally  . Kidney calculi 2011-05-20   past hx.  x1-passed on own, mild prostate issues-no meds  . PONV (postoperative nausea and vomiting) May 20, 2011   with cataract surgery  . Stroke [facility]) 10/2015    Social History   Socioeconomic History  . Marital status: Married    Spouse name: Not on file  . Number of children: 3  . Years of education: 39  . Highest education level: Not on file  Occupational History  . 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 Financial resource strain: Not on file  . Food insecurity:    Worry: Not on file    Inability: Not on file  . Transportation needs:    Medical: Not on file    Non-medical: Not on file  Tobacco Use  . Smoking status: Former Smoker    Years: 25.00    Types: Cigarettes    Last attempt to quit: 1988-05-19    Years since quitting: 30.1  . Smokeless tobacco: Never Used  Substance and Sexual Activity  . Alcohol use: No  . Drug use: No  . Sexual activity: Yes  Lifestyle  . Physical activity:    Days per week: Not on file    Minutes per session: Not on file  . Stress: Not on file  Relationships  . Social connections:    Talks on phone: Not on file    Gets together: Not on file  Attends religious service: Not on file    Active member of club or organization: Not on file    Attends meetings of clubs or organizations: Not on file    Relationship status: Not on file  . Intimate partner violence:    Fear of current or ex partner: Not on file    Emotionally abused: Not on file    Physically abused: Not on file    Forced sexual activity: Not on file  Other Topics Concern  . Not on file  Social History Narrative  . Not on file     Family History  Problem Relation Age of Onset  . Heart Problems Mother   . Cancer Father   . Cancer Brother   . Stroke Brother   . Heart Problems Brother   . Cancer Brother   . Heart Problems Brother   . Heart Problems Sister   . Hyperlipidemia Sister   . Birth defects Child   . Other Child        one kidney     Review of  Systems: General: negative for chills, fever, night sweats or weight changes.  Cardiovascular: negative for chest pain, dyspnea on exertion, edema, orthopnea, palpitations, paroxysmal nocturnal dyspnea or shortness of breath Dermatological: negative for rash Respiratory: negative for cough or wheezing Urologic: negative for hematuria Abdominal: negative for nausea, vomiting, diarrhea, bright red blood per rectum, melena, or hematemesis Neurologic: negative for visual changes, syncope, or dizziness All other systems reviewed and are otherwise negative except as noted above.    Blood pressure 122/90, pulse 60, height 6' (1.829 m), weight 152 lb 12.8 oz (69.3 kg).  General appearance: alert, cooperative and no distress Neck: no JVD Lungs: clear to auscultation bilaterally Heart: regular rate and rhythm Abdomen: not distended Extremities: no edema Skin: pale, warm, dry Neurologic: Grossly normal  EKG NSR, poor anterior RW, LAD  ASSESSMENT AND PLAN:   Pre-operative cardiovascular examination Pt is an acceptable candidate for colonoscopy from a cardiac standpoint.  He should hold his Eliquis 24 hour pre op.   PAF (paroxysmal atrial fibrillation) (HCC) Low burden per Dr Sallyanne Kuster  Long term current use of anticoagulant CHADS VASC=4  Liver lesion Multiple liver lesions and renal lesion noted on abdominal CT 07/03/2018. Pt and family were upset no one has told them anything about this except that he will need a biopsy. I called Dr Andres Labrum office about this.  They said the patient needs clearance for his colonoscopy from [place] and that [facility] will contact the patient about scheduling his liver biopsy-this was related to the patient.   History of CVA (cerebrovascular accident) Embolic CVA 6283   PLAN from a cardiac standpoint the patient is cleared to have colonoscopy.  I reviewed this with the pharmacist in the office, okay to hold his Eliquis 24 hours pre-colonoscopy.  In regards to his  liver biopsy, he would need to hold his Eliquis for 3 days.  This may require bridging.  I will review this with Dr Sallyanne Kuster.  Kerin Ransom PA-C 07/06/2018 4:52 PM

## 2018-07-06 NOTE — Assessment & Plan Note (Signed)
CHADS VASC=4 

## 2018-07-06 NOTE — Assessment & Plan Note (Signed)
Low burden per Dr Sallyanne Kuster

## 2018-07-06 NOTE — Patient Instructions (Signed)
Medication Instructions:  HOLD ELIQUIS THE DAY BEFORE COLONSCOPY If you need a refill on your cardiac medications before your next appointment, please call your pharmacy.   Lab work: NONE  If you have labs (blood work) drawn today and your tests are completely normal, you will receive your results only by: Marland Kitchen MyChart Message (if you have MyChart) OR . A paper copy in the mail If you have any lab test that is abnormal or we need to change your treatment, we will call you to review the results.  Testing/Procedures: NONE   Follow-Up: At [facility], you and your health needs are our priority.  As part of our continuing mission to provide you with exceptional heart care, we have created designated Provider Care Teams.  These Care Teams include your primary Cardiologist (physician) and Advanced Practice Providers (APPs -  Physician Assistants and Nurse Practitioners) who all work together to provide you with the care you need, when you need it. You will need a follow up appointment in 3 months. You may see Sanda Klein, MD or one of the following Advanced Practice Providers on your designated Care Team: [place], [place] . Fabian Sharp, PA-C  Any Other Special Instructions Will Be Listed Below (If Applicable).

## 2018-07-06 NOTE — Assessment & Plan Note (Addendum)
Multiple liver lesions and renal lesion noted on abdominal CT 07/03/2018. Pt and family were upset no one has told them anything about this except that he will need a biopsy. I called Dr Andres Labrum office about this.  They said the patient needs clearance for his colonoscopy from [place] and that [facility] will contact the patient about scheduling his liver biopsy-this was related to the patient.

## 2018-07-06 NOTE — Assessment & Plan Note (Signed)
Pt is an acceptable candidate for colonoscopy from a cardiac standpoint.  He should hold his Eliquis 24 hour pre op.

## 2018-07-06 NOTE — Assessment & Plan Note (Signed)
Embolic CVA 0786

## 2018-07-06 NOTE — Progress Notes (Signed)
I do not think we should bridge him. Just stopping the Eliquis for the necessary time for each procedure is the safer approach. Thanks for seeing him. MCr

## 2018-07-06 NOTE — Telephone Encounter (Signed)
   Primary Cardiologist: Sanda Klein, MD  Chart reviewed and patient seen and examined in the office today as part of pre-operative protocol coverage. Given past medical history and time since last visit, based on ACC/AHA guidelines, Antonio Manning would be at acceptable risk for the planned procedure without further cardiovascular testing.   OK to hold Eliquis 24 hours pre colonoscopy.   I will route this recommendation to the requesting party via Epic fax function and remove from pre-op pool.  Please call with questions.  Kerin Ransom, PA-C 07/06/2018, 4:55 PM

## 2018-07-10 ENCOUNTER — Telehealth: Payer: Self-pay | Admitting: Internal Medicine

## 2018-07-10 NOTE — Telephone Encounter (Deleted)
Wife stated Jonne Ply, scheduler at [facility] was told by Dr Sallyanne Kuster to hold Eliquis for 2 days. We do not have any record of this. Dr Sallyanne Kuster or Dr Lovena Le please advise. Wife very concerned about patient not taking his Eliquis

## 2018-07-10 NOTE — Telephone Encounter (Addendum)
Spoke with patients wife and she states she received a call Friday from the hospital to set up appt for biopsy. She wants to know how long to hold the Eliquis. I told wife I would speak to a nurse and Lurena Joiner and get back with her. She states the lady scheduler she spoke with names is Jonne Ply and she was told to hole the Eliquis 2 days before Biopsy. Wife is concerned about husband having a another stroke. Wife states The scheduler informed her that Dr Sallyanne Kuster said that patient needs to hold Eliquis for 2 days. I do not see that in the chart. Dr Loletha Grayer and Dr Lovena Le is out of the office.

## 2018-07-10 NOTE — Telephone Encounter (Signed)
New Message   Pt's wife is calling because the pt has a biopsy coming up and pts wife said they told them to stop taking the Eliquis the day before the colonoscopy.  Pts wife wants to know if he can go without the Eliquis for 5 days total  Please call back

## 2018-07-11 NOTE — Telephone Encounter (Signed)
Antonio Manning has already addressed, any further questions should go to him thanks.

## 2018-07-11 NOTE — Telephone Encounter (Signed)
Wife understands okay to hold Eliquis x 1 day for colonoscopy then 2 days for liver biopsy.

## 2018-07-11 NOTE — Telephone Encounter (Signed)
I have discussed this and responded  Javari Bufkin PA-C 07/11/2018 9:20 AM

## 2018-07-11 NOTE — Telephone Encounter (Signed)
Antonio Manning, can you please address, I see you were to discuss with Dr. Sallyanne Kuster thanks.

## 2018-07-19 ENCOUNTER — Encounter: Payer: Medicare Other | Admitting: Internal Medicine

## 2018-07-24 ENCOUNTER — Ambulatory Visit (INDEPENDENT_AMBULATORY_CARE_PROVIDER_SITE_OTHER): Payer: Medicare Other | Admitting: *Deleted

## 2018-07-24 ENCOUNTER — Other Ambulatory Visit: Payer: Self-pay

## 2018-07-24 DIAGNOSIS — I480 Paroxysmal atrial fibrillation: Secondary | ICD-10-CM | POA: Diagnosis not present

## 2018-07-25 ENCOUNTER — Ambulatory Visit (HOSPITAL_COMMUNITY): Payer: Medicare Other

## 2018-07-25 LAB — CUP PACEART REMOTE DEVICE CHECK
Date Time Interrogation Session: 20200322174202
Implantable Pulse Generator Implant Date: 20170628

## 2018-07-26 ENCOUNTER — Other Ambulatory Visit: Payer: Self-pay | Admitting: Radiology

## 2018-07-27 ENCOUNTER — Other Ambulatory Visit: Payer: Self-pay | Admitting: Radiology

## 2018-07-28 ENCOUNTER — Encounter (HOSPITAL_COMMUNITY): Payer: Self-pay

## 2018-07-28 ENCOUNTER — Ambulatory Visit (HOSPITAL_COMMUNITY)
Admission: RE | Admit: 2018-07-28 | Discharge: 2018-07-28 | Disposition: A | Discharge status: Home / Self Care | Payer: Medicare Other | Source: Ambulatory Visit | Attending: Gastroenterology | Admitting: Gastroenterology

## 2018-07-28 ENCOUNTER — Other Ambulatory Visit: Payer: Self-pay

## 2018-07-28 DIAGNOSIS — R160 Hepatomegaly, not elsewhere classified: Secondary | ICD-10-CM | POA: Diagnosis not present

## 2018-07-28 DIAGNOSIS — K769 Liver disease, unspecified: Secondary | ICD-10-CM

## 2018-07-28 DIAGNOSIS — C220 Liver cell carcinoma: Secondary | ICD-10-CM | POA: Diagnosis not present

## 2018-07-28 DIAGNOSIS — K7689 Other specified diseases of liver: Secondary | ICD-10-CM | POA: Diagnosis not present

## 2018-07-28 LAB — CBC
HCT: 36.7 % — ABNORMAL LOW (ref 39.0–52.0)
Hemoglobin: 11.6 g/dL — ABNORMAL LOW (ref 13.0–17.0)
MCH: 30 pg (ref 26.0–34.0)
MCHC: 31.6 g/dL (ref 30.0–36.0)
MCV: 94.8 fL (ref 80.0–100.0)
Platelets: 292 10*3/uL (ref 150–400)
RBC: 3.87 MIL/uL — ABNORMAL LOW (ref 4.22–5.81)
RDW: 17.3 % — ABNORMAL HIGH (ref 11.5–15.5)
WBC: 7 10*3/uL (ref 4.0–10.5)
nRBC: 0 % (ref 0.0–0.2)

## 2018-07-28 LAB — PROTIME-INR
INR: 1.1 (ref 0.8–1.2)
Prothrombin Time: 13.6 seconds (ref 11.4–15.2)

## 2018-07-28 MED ORDER — SODIUM CHLORIDE 0.9 % IV SOLN
INTRAVENOUS | Status: DC | PRN
  Administered 2018-07-28: 10 mL/h via INTRAVENOUS

## 2018-07-28 MED ORDER — LIDOCAINE HCL (PF) 1 % IJ SOLN
INTRAMUSCULAR | Status: AC
  Filled 2018-07-28: qty 30

## 2018-07-28 MED ORDER — MIDAZOLAM HCL 2 MG/2ML IJ SOLN
INTRAMUSCULAR | Status: AC
  Filled 2018-07-28: qty 2

## 2018-07-28 MED ORDER — FENTANYL CITRATE (PF) 100 MCG/2ML IJ SOLN
INTRAMUSCULAR | Status: DC | PRN
  Administered 2018-07-28 (×2): 25 ug via INTRAVENOUS

## 2018-07-28 MED ORDER — GELATIN ABSORBABLE 12-7 MM EX MISC
CUTANEOUS | Status: AC
  Filled 2018-07-28: qty 1

## 2018-07-28 MED ORDER — MIDAZOLAM HCL 2 MG/2ML IJ SOLN
INTRAMUSCULAR | Status: DC | PRN
  Administered 2018-07-28 (×2): 1 mg via INTRAVENOUS

## 2018-07-28 MED ORDER — FENTANYL CITRATE (PF) 100 MCG/2ML IJ SOLN
INTRAMUSCULAR | Status: AC
  Filled 2018-07-28: qty 2

## 2018-07-28 NOTE — Discharge Instructions (Signed)
Liver Biopsy, Care After °These instructions give you information about how to care for yourself after your procedure. Your health care provider may also give you more specific instructions. If you have problems or questions, contact your health care provider. °What can I expect after the procedure? °After your procedure, it is common to have: °· Pain and soreness in the area where the biopsy was done. °· Bruising around the area where the biopsy was done. °· Sleepiness and fatigue for 1-2 days. °Follow these instructions at home: °Medicines °· Take over-the-counter and prescription medicines only as told by your health care provider. °· If you were prescribed an antibiotic medicine, take it as told by your health care provider. Do not stop taking the antibiotic even if you start to feel better. °· Do not take medicines such as aspirin and ibuprofen unless your health care provider tells you to take them. These medicines thin your blood and can increase the risk of bleeding. °· If you are taking prescription pain medicine, take actions to prevent or treat constipation. Your health care provider may recommend that you: °? Drink enough fluid to keep your urine pale yellow. °? Eat foods that are high in fiber, such as fresh fruits and vegetables, whole grains, and beans. °? Limit foods that are high in fat and processed sugars, such as fried or sweet foods. °? Take an over-the-counter or prescription medicine for constipation. °Incision care °· Follow instructions from your health care provider about how to take care of your incision. Make sure you: °? Wash your hands with soap and water before you change your bandage (dressing). If soap and water are not available, use hand sanitizer. °? Change your dressing as told by your health care provider. °? Leave stitches (sutures), skin glue, or adhesive strips in place. These skin closures may need to stay in place for 2 weeks or longer. If adhesive strip edges start to  loosen and curl up, you may trim the loose edges. Do not remove adhesive strips completely unless your health care provider tells you to do that. °· Check your incision area every day for signs of infection. Check for: °? Redness, swelling, or pain. °? Fluid or blood. °? Warmth. °? Pus or a bad smell. °· Do not take baths, swim, or use a hot tub until your health care provider says it is okay to do so. °Activity ° °· Rest at home for 1-2 days, or as directed by your health care provider. °? Avoid sitting for a long time without moving. Get up to take short walks every 1-2 hours. This is important to improve blood flow and breathing. Ask for help if you feel weak or unsteady. °· Return to your normal activities as told by your health care provider. Ask your health care provider what activities are safe for you. °· Do not drive or use heavy machinery while taking prescription pain medicine. °· Do not lift anything that is heavier than 10 lb (4.5 kg), or the limit that your health care provider tells you, until he or she says that it is safe. °· Do not play contact sports for 2 weeks after the procedure. °General instructions ° °· Do not drink alcohol in the first week after the procedure. °· Have someone stay with you for at least 24 hours after the procedure. °· It is your responsibility to obtain your test results. Ask your health care provider, or the department that is doing the test: °? When will my   results be ready? °? How will I get my results? °? What are my treatment options? °? What other tests do I need? °? What are my next steps? °· Keep all follow-up visits as told by your health care provider. This is important. °Contact a health care provider if: °· You have increased bleeding from an incision, resulting in more than a small spot of blood. °· You have redness, swelling, or increasing pain in any incisions. °· You notice a discharge or a bad smell coming from any of your incisions. °· You have a fever or  chills. °Get help right away if: °· You develop swelling, bloating, or pain in your abdomen. °· You become dizzy or faint. °· You develop a rash. °· You have nausea or you vomit. °· You faint, or you have shortness of breath or difficulty breathing. °· You develop chest pain. °· You have problems with your speech or vision. °· You have trouble with your balance or moving your arms or legs. °Summary °· After the liver biopsy, it is common to have pain, soreness, and bruising in the area, as well as sleepiness and fatigue. °· Take over-the-counter and prescription medicines only as told by your health care provider. °· Follow instructions from your health care provider about how to care for your incision. Check the incision area daily for signs of infection. °This information is not intended to replace advice given to you by your health care provider. Make sure you discuss any questions you have with your health care provider. °Document Released: 11/06/2004 Document Revised: 04/29/2017 Document Reviewed: 04/29/2017 °Elsevier Interactive Patient Education © 2019 [employer] °Moderate Conscious Sedation, Adult, Care After °These instructions provide you with information about caring for yourself after your procedure. Your health care provider may also give you more specific instructions. Your treatment has been planned according to current medical practices, but problems sometimes occur. Call your health care provider if you have any problems or questions after your procedure. °What can I expect after the procedure? °After your procedure, it is common: °· To feel sleepy for several hours. °· To feel clumsy and have poor balance for several hours. °· To have poor judgment for several hours. °· To vomit if you eat too soon. °Follow these instructions at home: °For at least 24 hours after the procedure: ° °· Do not: °? Participate in activities where you could fall or become injured. °? Drive. °? Use heavy  machinery. °? Drink alcohol. °? Take sleeping pills or medicines that cause drowsiness. °? Make important decisions or sign legal documents. °? Take care of children on your own. °· Rest. °Eating and drinking °· Follow the diet recommended by your health care provider. °· If you vomit: °? Drink water, juice, or soup when you can drink without vomiting. °? Make sure you have little or no nausea before eating solid foods. °General instructions °· Have a responsible adult stay with you until you are awake and alert. °· Take over-the-counter and prescription medicines only as told by your health care provider. °· If you smoke, do not smoke without supervision. °· Keep all follow-up visits as told by your health care provider. This is important. °Contact a health care provider if: °· You keep feeling nauseous or you keep vomiting. °· You feel light-headed. °· You develop a rash. °· You have a fever. °Get help right away if: °· You have trouble breathing. °This information is not intended to replace advice given to you by your   health care provider. Make sure you discuss any questions you have with your health care provider. °Document Released: 02/07/2013 Document Revised: 09/22/2015 Document Reviewed: 08/09/2015 °Elsevier Interactive Patient Education © 2019 [employer] ° °

## 2018-07-28 NOTE — Procedures (Signed)
Interventional Radiology Procedure Note  Procedure: US Guided Biopsy of Liver  Complications: None  Estimated Blood Loss: < 10 mL  Findings: 18 G core biopsy of left lobe lesion performed under US guidance.  Three core samples obtained and sent to Pathology.  Venetia Night. Kathlene Cote, M.D Pager:  [phone]

## 2018-07-28 NOTE — H&P (Signed)
Chief Complaint: Patient was seen in consultation today for liver lesions/liver lesion biopsy.  Referring Physician(s): [id number],FYBOF  Supervising Physician: Aletta Edouard  Patient Status: [facility] - Out-pt  History of Present Illness: Antonio Manning is a 78 y.o. male with a past medical history of high cholesterol, CVA 2017, kidney stones, hearing loss, cataracts, and arthritis. He underwent CT abdomen 07/03/2018 which revealed multiple liver lesions suspicious for cancer.  CT abdomen/pelvis 07/03/2018: 1. Innumerable hepatic masses, predominantly involving the left hepatic lobe, including a dominant 8.9 cm mass in segment 4. This appearance is suspicious for metastatic disease versus multifocal HCC. 2. 1.9 cm enhancing lesion in the posterior left upper kidney, suspicious for solid renal neoplasm such as renal cell carcinoma. Given the additional findings, this is of questionable clinical significance. 3. Small volume abdominopelvic ascites.  IR requested by Dr. Alessandra Bevels for possible image-guided liver lesion biopsy. Patient awake and alert laying in bed with no complaints at this time. Denies fever, chills, chest pain, dyspnea, cough, abdominal pain, or headache.  Patient is currently taking Eliquis- reports last dose 07/25/2018.   Past Medical History:  Diagnosis Date  . Arthritis May 15, 2011   osteoarthritis,lt. hip, and neck area  . Cancer ([place]) 05/15/11   skin cancer lesions-multiple  . Cataract 05-15-11   right  . Cholesterol serum elevated 2011-05-15   tx. Lipitor  . Expressive aphasia 10/2015  . Hearing loss 05-15-11   wears hearing aids bilaterally  . Kidney calculi 15-May-2011   past hx. x1-passed on own, mild prostate issues-no meds  . PONV (postoperative nausea and vomiting) May 15, 2011   with cataract surgery  . Stroke [facility]) 10/2015    Past Surgical History:  Procedure Laterality Date  . BACK SURGERY  05-15-11   Lumbar disc surgery-no hardware  . CATARACT EXTRACTION W/  INTRAOCULAR LENS IMPLANT  15-May-2011   left eye  . EP IMPLANTABLE DEVICE N/A 10/29/2015   Procedure: Loop Recorder Insertion;  Surgeon: Evans Lance, MD;  Location: [facility];  Service: Cardiovascular;  Laterality: N/A;  . EYE SURGERY     eye lid lift   . I&D EXTREMITY Right 01/17/2016   Procedure: IRRIGATION AND DEBRIDEMENT LEG LACERATION;  Surgeon: Vickey Huger, MD;  Location: [place];  Service: Orthopedics;  Laterality: Right;  . TEE WITHOUT CARDIOVERSION N/A 10/29/2015   Procedure: TRANSESOPHAGEAL ECHOCARDIOGRAM (TEE);  Surgeon: Larey Dresser, MD;  Location: [place];  Service: Cardiovascular;  Laterality: N/A;  . TOTAL HIP ARTHROPLASTY  05/20/2011   Procedure: TOTAL HIP ARTHROPLASTY ANTERIOR APPROACH;  Surgeon: Mauri Pole, MD;  Location: [facility];  Service: Orthopedics;  Laterality: Left;    Allergies: Codeine  Medications: Prior to Admission medications   Medication Sig Start Date End Date Taking? Authorizing Provider  atorvastatin (LIPITOR) 40 MG tablet Take 40 mg by mouth daily.  03/24/18  Yes [provider]  ELIQUIS 5 MG TABS tablet TAKE 1 TABLET BY MOUTH TWICE A DAY 12/27/17  Yes Evans Lance, MD  Coenzyme Q10 (COQ10) 100 MG CAPS Take 100 mg by mouth daily.    [provider]     Family History  Problem Relation Age of Onset  . Heart Problems Mother   . Cancer Father   . Cancer Brother   . Stroke Brother   . Heart Problems Brother   . Cancer Brother   . Heart Problems Brother   . Heart Problems Sister   . Hyperlipidemia Sister   . Birth defects Child   .  Other Child        one kidney    Social History   Socioeconomic History  . Marital status: Married    Spouse name: Not on file  . Number of children: 3  . Years of education: 17  . Highest education level: Not on file  Occupational History  . 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 Financial resource strain: Not on file  . Food insecurity:    Worry: Not on file     Inability: Not on file  . Transportation needs:    Medical: Not on file    Non-medical: Not on file  Tobacco Use  . Smoking status: Former Smoker    Years: 25.00    Types: Cigarettes    Last attempt to quit: 05/12/1988    Years since quitting: 30.2  . Smokeless tobacco: Never Used  Substance and Sexual Activity  . Alcohol use: No  . Drug use: No  . Sexual activity: Yes  Lifestyle  . Physical activity:    Days per week: Not on file    Minutes per session: Not on file  . Stress: Not on file  Relationships  . Social connections:    Talks on phone: Not on file    Gets together: Not on file    Attends religious service: Not on file    Active member of club or organization: Not on file    Attends meetings of clubs or organizations: Not on file    Relationship status: Not on file  Other Topics Concern  . Not on file  Social History Narrative  . Not on file     Review of Systems: A 12 point ROS discussed and pertinent positives are indicated in the HPI above.  All other systems are negative.  Review of Systems  Constitutional: Negative for chills and fever.  Respiratory: Negative for cough and shortness of breath.   Cardiovascular: Negative for chest pain and palpitations.  Gastrointestinal: Negative for abdominal pain.  Neurological: Negative for headaches.  Psychiatric/Behavioral: Negative for behavioral problems and confusion.    Vital Signs: BP (!) 164/87   Pulse (!) 59   Temp 97.7 F (36.5 C) (Oral)   Resp 16   Ht 6' (1.829 m)   Wt 155 lb (70.3 kg)   SpO2 95%   BMI 21.02 kg/m   Physical Exam Vitals signs and nursing note reviewed.  Constitutional:      General: He is not in acute distress.    Appearance: Normal appearance.  Cardiovascular:     Rate and Rhythm: Normal rate and regular rhythm.     Heart sounds: Normal heart sounds. No murmur.  Pulmonary:     Effort: Pulmonary effort is normal. No respiratory distress.     Breath sounds: Normal breath  sounds. No wheezing.  Skin:    General: Skin is warm and dry.  Neurological:     Mental Status: He is alert and oriented to person, place, and time.  Psychiatric:        Mood and Affect: Mood normal.        Behavior: Behavior normal.        Thought Content: Thought content normal.        Judgment: Judgment normal.      MD Evaluation Airway: WNL Heart: WNL Abdomen: WNL Chest/ Lungs: WNL ASA  Classification: 3 Mallampati/Airway Score: One   Imaging: Ct Abdomen Pelvis W Contrast  Result Date: 07/03/2018 CLINICAL DATA:  Elevated  LFTs, history of renal stones EXAM: CT ABDOMEN AND PELVIS WITH CONTRAST TECHNIQUE: Multidetector CT imaging of the abdomen and pelvis was performed using the standard protocol following bolus administration of intravenous contrast. CONTRAST:  181mL ISOVUE-300 IOPAMIDOL (ISOVUE-300) INJECTION 61% COMPARISON:  10/22/2003 FINDINGS: Lower chest: Minimal dependent atelectasis in the bilateral lower lobes. Hepatobiliary: Innumerable hepatic masses throughout the left hepatic lobe, suspicious for metastatic disease versus multifocal HCC. Dominant lesion measures 8.9 x 8.1 cm in segment 4 (series 2/image 27). Discrete 3.9 x 5.3 cm lesion inferiorly in segment 3 (series 2/image 45). Additional discrete 4.6 x 3.9 cm lesion is likely in segment 5 (series 2/image 33). Numerous additional lesions are coalescent within the left hepatic lobe. Gallbladder is unremarkable. No intrahepatic or extrahepatic ductal dilatation. Pancreas: Dominant mass abuts the pancreas, but the pancreas is grossly unremarkable. Spleen: Within normal limits. Adrenals/Urinary Tract: Adrenal glands are within normal limits. 1.9 cm enhancing lesion in the posterior left upper kidney (series 11/image 17). Additional scattered cysts measuring up to 11 mm in the right lower kidney (series 11/image 29). No hydronephrosis. Bladder is obscured by streak artifact but grossly unremarkable. Stomach/Bowel: Dominant mass  abuts the gastric antrum, but the stomach is grossly unremarkable. No evidence of bowel obstruction. Normal appendix (series 2/image 54). No colonic wall thickening or mass is seen. Vascular/Lymphatic: No evidence of abdominal aortic aneurysm. Atherosclerotic calcifications of the abdominal aorta and branch vessels. No suspicious abdominopelvic lymphadenopathy. Reproductive: Prostate is grossly unremarkable. Other: Small volume perihepatic, perisplenic, and pelvic ascites. Tiny fat containing left inguinal hernia (series 2/image 79). Musculoskeletal: Degenerative changes of the visualized thoracolumbar spine. IMPRESSION: Innumerable hepatic masses, predominantly involving the left hepatic lobe, including a dominant 8.9 cm mass in segment 4. This appearance is suspicious for metastatic disease versus multifocal HCC. 1.9 cm enhancing lesion in the posterior left upper kidney, suspicious for solid renal neoplasm such as renal cell carcinoma. Given the additional findings, this is of questionable clinical significance. Small volume abdominopelvic ascites. Electronically Signed   By: Julian Hy M.D.   On: 07/03/2018 14:30    Labs:  CBC: Recent Labs    07/28/18 1130  WBC 7.0  HGB 11.6*  HCT 36.7*  PLT 292    COAGS: Recent Labs    07/28/18 1130  INR 1.1     Assessment and Plan:  Liver lesions. Plan for image-guided liver lesion biopsy today with Dr. Kathlene Cote. Patient is NPO. Afebrile and WBCs WNL. Patient is currently taking Eliquis, reports last dose 07/25/2018- ok to proceed per IR protocol. INR 1.1 seconds today.  Risks and benefits discussed with the patient including, but not limited to bleeding, infection, damage to adjacent structures or low yield requiring additional tests. All of the patient's questions were answered, patient is agreeable to proceed. Consent signed and in chart.   Thank you for this interesting consult.  I greatly enjoyed meeting Antonio Manning and look  forward to participating in their care.  A copy of this report was sent to the requesting provider on this date.  Electronically Signed: Earley Abide, PA-C 07/28/2018, 11:59 AM   I spent a total of 30 Minutes in face to face in clinical consultation, greater than 50% of which was counseling/coordinating care for liver lesions/liver lesion biopsy.

## 2018-08-02 NOTE — Progress Notes (Signed)
[facility] Summary Report / Loop Recorder 

## 2018-08-04 ENCOUNTER — Telehealth: Payer: Self-pay

## 2018-08-04 ENCOUNTER — Inpatient Hospital Stay: Payer: Medicare Other | Admitting: Hematology

## 2018-08-04 DIAGNOSIS — C220 Liver cell carcinoma: Secondary | ICD-10-CM | POA: Insufficient documentation

## 2018-08-04 NOTE — Telephone Encounter (Signed)
Spoke with adult son, Antonio Manning. Mrs. Takeda gave me verbal permission to speak with him  Margues, Filippini, cell is [phone]. Parents are not comfortable with with electronics. He will help them get a tablet, I will e-mail him copy of WebEx patient instructions. He will call [place] back to let us know when they are ready for Webex. He asked that they also have an appointment for next Wednesday on the books. He has my direct contact number and number at [facility]  Spoke with Mrs. Bacci to update her on the plan moving forward.Marland Kitchen

## 2018-08-07 DIAGNOSIS — R0982 Postnasal drip: Secondary | ICD-10-CM | POA: Diagnosis not present

## 2018-08-07 DIAGNOSIS — R1013 Epigastric pain: Secondary | ICD-10-CM | POA: Diagnosis not present

## 2018-08-07 DIAGNOSIS — D649 Anemia, unspecified: Secondary | ICD-10-CM | POA: Diagnosis not present

## 2018-08-07 DIAGNOSIS — R110 Nausea: Secondary | ICD-10-CM | POA: Diagnosis not present

## 2018-08-07 DIAGNOSIS — R5381 Other malaise: Secondary | ICD-10-CM | POA: Diagnosis not present

## 2018-08-07 NOTE — Progress Notes (Signed)
[place]   [phone] Fax:([phone]) [phone]   Clinic Follow up Note   Patient Care Team: Hulan Fess, MD as PCP - General (Family Medicine) Croitoru, Dani Gobble, MD as PCP - Cardiology (Cardiology)   I connected with Early Chars Hitsman on 08/09/2018 at  1:00 PM EDT by telephone and verified that I am speaking with the correct person using two identifiers.   I discussed the limitations, risks, security and privacy concerns of performing an evaluation and management service by telephone/video and the availability of in person appointments. I also discussed with the patient that there may be a patient responsible charge related to this service. The patient expressed understanding and agreed to proceed.   Other persons participating in the visit and their role in the encounter:  With Wife, Daughter and son  Patient's location: His home  Provider's location: My office    CHIEF COMPLAINTS/PURPOSE OF CONSULTATION:  Newly Diagnosed HCC  REFERRING PHYSICIAN:  GI, Dr. Alessandra Bevels  SUMMARY OF ONCOLOGIC HISTORY:   Hepatocellular carcinoma ([place])   07/03/2018 Imaging    CT AP W Contrast 07/03/18  IMPRESSION: Innumerable hepatic masses, predominantly involving the left hepatic lobe, including a dominant 8.9 cm mass in segment 4. This appearance is suspicious for metastatic disease versus multifocal HCC.  1.9 cm enhancing lesion in the posterior left upper kidney, suspicious for solid renal neoplasm such as renal cell carcinoma. Given the additional findings, this is of questionable clinical significance. Small volume abdominopelvic ascites.     07/28/2018 Initial Biopsy    Diagnosis 07/28/18 Liver, needle/core biopsy, Left lobe - HEPATOCELLULAR CARCINOMA. SEE COMMENT.    07/28/2018 Cancer Staging    Staging form: Liver, AJCC 8th Edition - Clinical stage from 07/28/2018: Stage IIIA (cT3, cN0, cM0) - Signed by [name], MD on 08/09/2018    08/04/2018 Initial Diagnosis   Hepatocellular carcinoma ([place])    08/21/2018 -  Chemotherapy    The patient had bevacizumab (AVASTIN) 1,050 mg in sodium chloride 0.9 % 100 mL chemo infusion, 15 mg/kg = 1,050 mg, Intravenous,  Once, 0 of 6 cycles atezolizumab (TECENTRIQ) 1,200 mg in sodium chloride 0.9 % 250 mL chemo infusion, 1,200 mg, Intravenous, Once, 0 of 6 cycles  for chemotherapy treatment.      HISTORY OF PRESENTING ILLNESS:  Antonio Manning 78 y.o. male is a here because of newly diagnosed [place]. The patient was referred by his PCP, Dr. Rex Manning. He was able to identify himself by birth date.   His PCP referred him to GI. He was seen by Antonio Manning GI Dr. Alessandra Bevels who did his liver biopsy. Results showed him to have Antonio Manning. He did not have endoscopy or colonoscopy done. His last was in 10 years. He plans to have one soon.   Today the patient denies pain but has sore soreness occasional recently. This occurs mainly when he lays down. He does have nausea as well for the last 2 months. He notes he appetite and eating has decreased. He feels his stomach cannot hold as much food and his taste has changed so now he eats much smaller meals now. He eats better in the morning. He has lost 8-10 pounds over the last month.  He denies constipation, diarrhea or blood in stool. He denies cough or chest discomfort. He is more SOB than he use to be. He denies peripheral edema.  He feels he is able to take care of himself. He has felt worse though for the past month. He does  not require assistance ambulating. No recent falls, his daughter notes he will be dizzy at times like this morning. He has become less active in the last 2 months.   Socially he is married with 3 children. 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 He gets morphine injections now and may retire soon. He stopped drinking 50 years ago after drinking moderately before then.  They have a PMHx of stroke 3 years ago and has no  residual effects except on his speech mildly. He denies heart disease or MI. He has had orthopedic issues with his back and hip, now post surgeries. He denies h/o liver disease. He has had a lot of skin cancers in his past.   His wife notes she was had a bad URI around the holidays which has badly effected her lungs. She has not been leaving the house much since given the degree of her lung damage. She is concerned with him bringing anything back home when he goes out of the house.     REVIEW OF SYSTEMS:   Constitutional: Denies fevers, chills (+)8-10 pound weight loss  Eyes: Denies blurriness of vision Ears, nose, mouth, throat, and face: Denies mucositis or sore throat Respiratory: Denies cough, dyspnea or wheezes Cardiovascular: Denies palpitation, chest discomfort or lower extremity swelling Gastrointestinal:  Denies heartburn or change in bowel habits (+) Abdominal soreness (+) nausea  Skin: Denies abnormal skin rashes Lymphatics: Denies new lymphadenopathy or easy bruising Neurological:Denies numbness, tingling or new weaknesses Behavioral/Psych: Mood is stable, no new changes  All other systems were reviewed with the patient and are negative.  MEDICAL HISTORY:  Past Medical History:  Diagnosis Date  . Arthritis 2011/06/04   osteoarthritis,lt. hip, and neck area  . Cancer ([place]) 06/04/2011   skin cancer lesions-multiple  . Cataract 2011/06/04   right  . Cholesterol serum elevated 06-04-2011   tx. Lipitor  . Expressive aphasia 10/2015  . Hearing loss 06-04-11   wears hearing aids bilaterally  . Kidney calculi 2011/06/04   past hx. x1-passed on own, mild prostate issues-no meds  . PONV (postoperative nausea and vomiting) 2011/06/04   with cataract surgery  . Stroke [facility]) 10/2015   SURGICAL HISTORY: Past Surgical History:  Procedure Laterality Date  . BACK SURGERY  06-04-2011   Lumbar disc surgery-no hardware  . CATARACT EXTRACTION W/ INTRAOCULAR LENS IMPLANT  June 04, 2011   left eye  . EP  IMPLANTABLE DEVICE N/A 10/29/2015   Procedure: Loop Recorder Insertion;  Surgeon: Evans Lance, MD;  Location: [place] [facility];  Service: Cardiovascular;  Laterality: N/A;  . EYE SURGERY     eye lid lift   . I&D EXTREMITY Right 01/17/2016   Procedure: IRRIGATION AND DEBRIDEMENT LEG LACERATION;  Surgeon: Vickey Huger, MD;  Location: [place];  Service: Orthopedics;  Laterality: Right;  . TEE WITHOUT CARDIOVERSION N/A 10/29/2015   Procedure: TRANSESOPHAGEAL ECHOCARDIOGRAM (TEE);  Surgeon: Larey Dresser, MD;  Location: [place];  Service: Cardiovascular;  Laterality: N/A;  . TOTAL HIP ARTHROPLASTY  05/20/2011   Procedure: TOTAL HIP ARTHROPLASTY ANTERIOR APPROACH;  Surgeon: Mauri Pole, MD;  [facility];  Service: Orthopedics;  Laterality: Left;    I have reviewed the social history and family history with the patient and they are unchanged from previous note.  ALLERGIES:  is allergic to codeine.  MEDICATIONS:  Current Outpatient Medications  Medication Sig Dispense Refill  . atorvastatin (LIPITOR) 40 MG tablet Take  40 mg by mouth daily.     . Coenzyme Q10 (COQ10) 100 MG CAPS Take 100 mg by mouth daily.    Marland Kitchen ELIQUIS 5 MG TABS tablet TAKE 1 TABLET BY MOUTH TWICE A DAY 180 tablet 2  . ondansetron (ZOFRAN) 8 MG tablet Take 1 tablet (8 mg total) by mouth 2 (two) times daily as needed (Nausea or vomiting). 30 tablet 1   No current facility-administered medications for this visit.     PHYSICAL EXAMINATION: ECOG PERFORMANCE STATUS: 2 - Symptomatic, <50% confined to bed  -No vitals taken today   Exam not performed   LABORATORY DATA:  I have reviewed the data as listed CBC Latest Ref Rng & Units 07/28/2018 01/17/2016 01/17/2016  WBC 4.0 - 10.5 K/uL 7.0 3.9(L) -  Hemoglobin 13.0 - 17.0 g/dL 11.6(L) 12.2(L) 13.6  Hematocrit 39.0 - 52.0 % 36.7(L) 37.0(L) 40.0  Platelets 150 - 400 K/uL 292 117(L) -     CMP Latest Ref Rng & Units 01/17/2016 01/17/2016 10/27/2015  Glucose 65 - 99 mg/dL  91 126(H) -  BUN 6 - 20 mg/dL 24(H) 30(H) -  Creatinine 0.61 - 1.24 mg/dL 1.02 1.00 0.89  Sodium 135 - 145 mmol/L 143 143 -  Potassium 3.5 - 5.1 mmol/L 4.3 4.2 -  Chloride 101 - 111 mmol/L 109 104 -  CO2 22 - 32 mmol/L 27 - -  Calcium 8.9 - 10.3 mg/dL 8.8(L) - -  Total Protein 6.5 - 8.1 g/dL - - -  Total Bilirubin 0.3 - 1.2 mg/dL - - -  Alkaline Phos 38 - 126 U/L - - -  AST 15 - 41 U/L - - -  ALT 17 - 63 U/L - - -      RADIOGRAPHIC STUDIES: I have personally reviewed the radiological images as listed and agreed with the findings in the report. No results found.   ASSESSMENT & PLAN:  Antonio Manning is a 78 y.o. male with H/o stroke, H/o skin cancer, hearing loss, and OA.   1. Hepatocellular Carcinoma, cT3N0Mx -I reviewed his image findings and biopsy results with him and his family in great detail.  -His CT AP from 07/03/18 shows multifocal large tumor in his live, mainly in left lobe. Biopsy from 07/28/18 showed [place]. There is also a 1.9 lesion seen on left kidney suspicions for kidney cancer.  No evidence of distant metastasis in the abdomen and pelvis.  -He plans to have a endoscopy with Dr. Alessandra Bevels soon. Last was 10 years ago.  -I discussed his large liver mass is likely the cause of his abdominal soreness.  -Will get CT chest to rule out metastasis in chest. He is agreeable.  -He does not history of liver disease, fatty liver or alcohol abuse so etiology is unclear. I will check Hep B and Hep C.  It is unusual for him to have her liver cancer without history of liver disease and evidence of liver cirrhosis on scan.  I will review his pathology in the tumor board. -I discussed treatment options with him. Given his age, multifocal cancer and large tumor size he is likely not eligible for surgical resection to cure his disease. I discussed the options of liver targeted therapy with Y90 and system therapy. Both options are not curative, but palliative to control his disease. I will  discussed more options with GI tumor Board next week.  -Given he will likely wait for a few months or longer to get liver target therapy due to  COVID-19, and the large tumor burden, I recommend starting with systemic therapy. -I discussed the options of TKI (such as sorafenib and lenvatinib), immunotherapy, or combination of immunotherapy Atezolizumab 1200mg  and bevacizumab 15mg /kg every 3 weeks, which has been studied in phase 3 [id number] study. Comparing to sorafenib, the combination showed significantly improved PFS and overall survival. Based on this data, I recommend this to him as first line therapy.  --Chemotherapy consent: Side effects including but does not not limited to, fatigue, low appetite, diarrhea, thyroid dysfunction, and other endocrine dysfunction, pneumonitis, colitis, small risk of thrombosis, bleeding, bowel perforation, hypertension, proteinuria, etc. were discussed with patient in great detail. He agrees to proceed. -Plan to start in 2 weeks.  -I strongly encouraged him to continue COVID-19 precautions and continue social distancing.  -F/u in 2 weeks before first dose treatment    2. Lower appetite and weight loss  -He has lost 8-10 pounds in the past month.  -Secondary to nausea and underlying malignancy  -dietician consult    3. Left Kidney lesion  -Seen on 07/03/18 CT AP which is suspicions for kidney cancer. Further workup at this time is not urgent given [place]. Will monitor closely for now.   4. AF -On Eliquis, follow-up with cardiology  PLAN:  -CT chest without contrast at GI next week  -GI tumor board discussion next week -nutrition consult  -Lab, f/u and treatment with Atezolizumab and bevacizumab in 2 weeks    No problem-specific Assessment & Plan notes found for this encounter.   Orders Placed This Encounter  Procedures  . CT Chest Wo Contrast    Standing Status:   Future    Standing Expiration Date:   08/09/2019    Order Specific Question:   Preferred  imaging location?    Answer:   [id number]    Order Specific Question:   Radiology Contrast Protocol - do NOT remove file path    Answer:   [email or web address]  . TSH    Standing Status:   Standing    Number of Occurrences:   20    Standing Expiration Date:   08/09/2019  . CMP ([place] only)    Standing Status:   Standing    Number of Occurrences:   20    Standing Expiration Date:   08/09/2019  . CBC with Differential ([facility] Only)    Standing Status:   Standing    Number of Occurrences:   20    Standing Expiration Date:   08/09/2019  . AFP tumor marker    Standing Status:   Standing    Number of Occurrences:   20    Standing Expiration Date:   08/09/2019   All questions were answered. The patient knows to call the clinic with any problems, questions or concerns. No barriers to learning was detected. I spent 50 minutes counseling the patient over the [facility]. The total time spent in the appointment was 60 minutes and more than 50% was on counseling and review of test results     [name], MD 08/09/2018   I, Joslyn Devon, am acting as scribe for [name], MD.   I have reviewed the above documentation for accuracy and completeness, and I agree with the above.

## 2018-08-08 ENCOUNTER — Telehealth: Payer: Self-pay

## 2018-08-08 NOTE — Telephone Encounter (Signed)
Spoke with patient's son, Caydin Yeatts, who lives in [place], [place]. Tablet arrived in the mail today and his parents are working on getting it set up fpr WebEx meeting with Dr. Burr Medico on 08/09/18.

## 2018-08-08 NOTE — Telephone Encounter (Signed)
New message    Called patient to let him know appt for Friday would be canceled per RN since he is followed by Dr. Loletha Grayer. Patient wife requested call back from RN because they aren't sure how they got followed by Dr. Loletha Grayer and perfer Dr. Lovena Le. Pt wife advised that appt for 04.10.20 would be canceled and RN would call at earliest convenience.

## 2018-08-09 ENCOUNTER — Encounter: Payer: Self-pay | Admitting: Hematology

## 2018-08-09 ENCOUNTER — Telehealth: Payer: Self-pay

## 2018-08-09 ENCOUNTER — Inpatient Hospital Stay: Payer: Medicare Other | Attending: Hematology | Admitting: Hematology

## 2018-08-09 DIAGNOSIS — Z79899 Other long term (current) drug therapy: Secondary | ICD-10-CM | POA: Insufficient documentation

## 2018-08-09 DIAGNOSIS — Z8673 Personal history of transient ischemic attack (TIA), and cerebral infarction without residual deficits: Secondary | ICD-10-CM | POA: Insufficient documentation

## 2018-08-09 DIAGNOSIS — I714 Abdominal aortic aneurysm, without rupture: Secondary | ICD-10-CM | POA: Insufficient documentation

## 2018-08-09 DIAGNOSIS — C220 Liver cell carcinoma: Secondary | ICD-10-CM | POA: Diagnosis not present

## 2018-08-09 DIAGNOSIS — M25562 Pain in left knee: Secondary | ICD-10-CM | POA: Insufficient documentation

## 2018-08-09 DIAGNOSIS — R634 Abnormal weight loss: Secondary | ICD-10-CM | POA: Insufficient documentation

## 2018-08-09 DIAGNOSIS — Z9221 Personal history of antineoplastic chemotherapy: Secondary | ICD-10-CM | POA: Insufficient documentation

## 2018-08-09 DIAGNOSIS — M129 Arthropathy, unspecified: Secondary | ICD-10-CM | POA: Insufficient documentation

## 2018-08-09 DIAGNOSIS — R5383 Other fatigue: Secondary | ICD-10-CM | POA: Insufficient documentation

## 2018-08-09 DIAGNOSIS — Z7901 Long term (current) use of anticoagulants: Secondary | ICD-10-CM | POA: Insufficient documentation

## 2018-08-09 DIAGNOSIS — W010XXA Fall on same level from slipping, tripping and stumbling without subsequent striking against object, initial encounter: Secondary | ICD-10-CM | POA: Insufficient documentation

## 2018-08-09 DIAGNOSIS — J439 Emphysema, unspecified: Secondary | ICD-10-CM | POA: Insufficient documentation

## 2018-08-09 DIAGNOSIS — D649 Anemia, unspecified: Secondary | ICD-10-CM | POA: Insufficient documentation

## 2018-08-09 DIAGNOSIS — Z5112 Encounter for antineoplastic immunotherapy: Secondary | ICD-10-CM | POA: Insufficient documentation

## 2018-08-09 DIAGNOSIS — Z87442 Personal history of urinary calculi: Secondary | ICD-10-CM | POA: Insufficient documentation

## 2018-08-09 DIAGNOSIS — Z85828 Personal history of other malignant neoplasm of skin: Secondary | ICD-10-CM | POA: Insufficient documentation

## 2018-08-09 DIAGNOSIS — R110 Nausea: Secondary | ICD-10-CM | POA: Insufficient documentation

## 2018-08-09 MED ORDER — ONDANSETRON HCL 8 MG PO TABS: 8.0000 mg | ORAL_TABLET | Freq: Two times a day (BID) | ORAL | 1 refills | Status: DC | PRN

## 2018-08-09 NOTE — Progress Notes (Signed)
START OFF PATHWAY REGIMEN - Other Dx   [id number]:Atezolizumab 1,200 mg IV D1 + Bevacizumab 15 mg/kg IV D1 q21 Days:   A cycle is every 21 Days:     Atezolizumab      Bevacizumab-xxxx   **Always confirm dose/schedule in your pharmacy ordering system**  Patient Characteristics: Intent of Therapy: Non-Curative / Palliative Intent, Discussed with Patient 

## 2018-08-09 NOTE — Telephone Encounter (Signed)
Diamond, thanks. If Webex does not work out, hope he has a Stage manager and I can call in face-to-face. We will make it work. Thanks   [name] MD

## 2018-08-09 NOTE — Telephone Encounter (Signed)
Antonio Manning, son, texted me to let me know that his parents are unable to use WebEx. Patient teaching sheet on WebEx use provided to Mr. Klemens daughter who lives in [place]. She will help facilitate the call. At this time Quita Skye is working on arranging details.

## 2018-08-10 ENCOUNTER — Telehealth: Payer: Self-pay | Admitting: Hematology

## 2018-08-10 ENCOUNTER — Telehealth: Payer: Self-pay

## 2018-08-10 NOTE — Telephone Encounter (Signed)
Returned call to family.  Advised would be ok for Antonio Manning to stay with Dr. Lovena Le.    Antonio Manning c/o left eye blurriness with head pain when he opens eye.  Advised wife to call PCP or neurologist asap to discuss.  Wife indicates understanding.

## 2018-08-10 NOTE — Telephone Encounter (Signed)
Spoke with patient daughter about the concerns he mom had about her dad appts.  Patient daughter just wanted to make sure that his patient edu class could be a phone visit.   I called and verified that it can be a phone visit.  Appts are scheduled and the daughter are aware of appts.  (MD informed me that they might call back to cancel, if they decide to try a different route)

## 2018-08-10 NOTE — Telephone Encounter (Signed)
Called patient to try to schedule appts per 4/8 los.  The patient wife stated that she did not want her husband coming to the cancer center, for the patient education class. She informed me that she has her own health issues and is afraid if he come here he might catch something and bring it back home to her and she will die. She stated her lungs are gone and that she is afraid for her health. She ask me over the phone is it a way he can do everything from home, including his treatment. I told her that I know the nutrition appt can be a phone visit, but as far as the other appts I am not sure.   I told her I will inform the doctor of the issue, and someone will get back in touch with them.  I did not schedule any appts, waiting on further instructions from the doctor.

## 2018-08-10 NOTE — Telephone Encounter (Signed)
Called and spoke with wife, Antonio Manning, regarding what the process will look like moving forward. CT scheduled for 05/12/18. Wife reports that early morning calls about appointments are hard for her. She has numerous health issues and doesn't "wake up until later in the morning". I reassured her that Dr. Burr Medico has spoken to her daughter today and everything is set up. She's appreciative that the chemo education class is able to be done by phone. She is anxious about her husband of 82 years being out in public and bringing a virus back to her. I reassured her that [facility] is taking precautions to protect our patients and was able to give her several examples. I reassured her that she can call me with questions or concerns. She has my direct phone number. All questions were answered to wife's satisfaction.Will continue to support and follow.

## 2018-08-11 ENCOUNTER — Other Ambulatory Visit: Payer: Self-pay

## 2018-08-11 ENCOUNTER — Ambulatory Visit
Admission: RE | Admit: 2018-08-11 | Discharge: 2018-08-11 | Disposition: A | Discharge status: Home / Self Care | Payer: Medicare Other | Source: Ambulatory Visit | Attending: Hematology | Admitting: Hematology

## 2018-08-11 ENCOUNTER — Encounter: Payer: Medicare Other | Admitting: Internal Medicine

## 2018-08-11 DIAGNOSIS — C220 Liver cell carcinoma: Secondary | ICD-10-CM

## 2018-08-14 ENCOUNTER — Other Ambulatory Visit: Payer: Medicare Other

## 2018-08-14 ENCOUNTER — Inpatient Hospital Stay: Payer: Medicare Other

## 2018-08-14 ENCOUNTER — Telehealth: Payer: Self-pay | Admitting: *Deleted

## 2018-08-15 ENCOUNTER — Telehealth: Payer: Self-pay

## 2018-08-15 NOTE — Telephone Encounter (Signed)
Late Entry: [name] Uriostegui called to discuss father's prognosis and to advise that patient to have apt at [facility] on 08/16/18 for second opinion. He will call me with fax number of provider so that Liver BX pathology report can be faxed.

## 2018-08-15 NOTE — Progress Notes (Signed)
Request e-mailed to Margit Hanks in radiology to upload imaging to [employer] for appointment at [facility] for second opinion.

## 2018-08-16 DIAGNOSIS — I4891 Unspecified atrial fibrillation: Secondary | ICD-10-CM | POA: Diagnosis not present

## 2018-08-16 DIAGNOSIS — C220 Liver cell carcinoma: Secondary | ICD-10-CM | POA: Diagnosis not present

## 2018-08-21 ENCOUNTER — Encounter: Payer: Medicare Other | Admitting: Nutrition

## 2018-08-21 ENCOUNTER — Inpatient Hospital Stay: Payer: Medicare Other | Admitting: Nutrition

## 2018-08-21 NOTE — Progress Notes (Signed)
RD working remotely.  78 year old male diagnosed with Hepatocellular Cancer. He is a patient of Dr. Burr Medico.  PMH includes Stroke, hearing loss, Expressive aphasia, Hypercholesterolemia.  Labs were reviewed.  Medications include Lipitor, CoQ10, Zofran.  Height: 6\' 0" . Weight: 155 pounds. UBW: 165 pounds. BMI: 21.02.  Patient reports pain and nausea kept him awake most of the night. Reports he feels slightly better when he can eat a little. Endorses poor appetite and weight loss of ~ 10 pounds. Has tried one nutrition shake and did not like it.  Nutrition Diagnosis: Unintended weight loss related to hepatocellular cancer and associated treatments as evidenced by 10 pound weight loss from UBW.  Intervention: Educated to take nausea medication as prescribed. Encouraged small frequent meals and snacks 6 times daily. Educated on high calorie, high protein foods. Encourgaed patient to try other ONS. Mail fact sheets to patient with my contact number.  Monitoring, Evaluation, Goals: Patient will tolerate increased calories and protein to minimize weight loss.  Next Visit: Follow up as needed.

## 2018-08-21 NOTE — Progress Notes (Signed)
[place]   [phone] Fax:([phone]) [phone]   Clinic Follow up Note   Patient Care Team: Hulan Fess, MD as PCP - General (Family Medicine) Croitoru, Dani Gobble, MD as PCP - Cardiology (Cardiology) Arna Snipe, RN as Oncology Nurse Navigator  Date of Service:  08/23/2018  CHIEF COMPLAINT: F/u of newly diagnosed [place]  SUMMARY OF ONCOLOGIC HISTORY:   Hepatocellular carcinoma ([place])   07/03/2018 Imaging    CT AP W Contrast 07/03/18  IMPRESSION: Innumerable hepatic masses, predominantly involving the left hepatic lobe, including a dominant 8.9 cm mass in segment 4. This appearance is suspicious for metastatic disease versus multifocal HCC.  1.9 cm enhancing lesion in the posterior left upper kidney, suspicious for solid renal neoplasm such as renal cell carcinoma. Given the additional findings, this is of questionable clinical significance. Small volume abdominopelvic ascites.     07/28/2018 Initial Biopsy    Diagnosis 07/28/18 Liver, needle/core biopsy, Left lobe - HEPATOCELLULAR CARCINOMA. SEE COMMENT.    07/28/2018 Cancer Staging    Staging form: Liver, AJCC 8th Edition - Clinical stage from 07/28/2018: Stage IIIA (cT3, cN0, cM0) - Signed by [name], MD on 08/09/2018    08/04/2018 Initial Diagnosis    Hepatocellular carcinoma ([place])    08/13/2018 Imaging    CT Chest 08/13/18  IMPRESSION: 1. No evidence of thoracic metastatic disease. 2. Emphysema with right middle lobe bronchiectasis. 3. Ascending thoracic aortic aneurysm. Recommend semi-annual imaging followup by CTA or MRA and referral to cardiothoracic surgery if not already obtained. This recommendation follows 2010 ACCF/AHA/AATS/ACR/ASA/SCA/SCAI/SIR/STS/SVM Guidelines for the Diagnosis and Management of Patients With Thoracic Aortic Disease. Circulation. 2010; 121: K812-X517 4. Multicentric hepatocellular carcinoma with involvement of the right lobe. ADDENDUM: incidental finding in the original  report. There is a subcutaneous lesion in the upper back measuring 2.0 cm and 2 HU on image 4/2, likely an incidental an epidermal inclusion (sebaceous) cyst.    08/21/2018 -  Chemotherapy    Tecentriq (Atezolizumab) and Avastin (bevacizumab) starting 08/23/18      CURRENT THERAPY:  Tecentriq (Atezolizumab) 1228m and Avastin (bevacizumab) 130mkg every 3 weeks starting 08/23/18  INTERVAL HISTORY:  [facility] He presents to the clinic today by himself.  He notes he is having a hard time hearing today. He notes he had a left hip replacement 5 years ago and today he fell on his lefty hip and knee after tripping. He is able to walk but has some discomfort.   He notes diffuse upper abdominal pain mainly when he lays down. This can improve when he changes to certain positions. His pain is currently tolerable and does not need any medication for pain. He does not like medication so unless he requests it he will not take it. He note nausea but no vomiting. This can effects his appetite but taken anti-emetics which help. He notes his bowel movements are close to normal. He feels he has lost 10 pounds over the past 2 months but has now stabilized. He notes he can eat but does not have an appetite due to taste change.  He notes his energy level is very low. He is not able to do yard work. He can go up a flight of stairs but slowly. He lives on first floor of home. He can do house work but shares that with his wife who he lives with. He notes his wife has poor lungs and is prone to URI.  He rather not include too many family members during his visits which can lead to confusion. He has 1 daughter and 2 sons. His youngest son communicates with me the most. He is fine with them up to date on his care. He is more emotional about the health of his wife.    REVIEW OF SYSTEMS:   Constitutional: Denies fevers, chills (+) taste change, low appetite (+) Low energy   Eyes: Denies blurriness of vision Ears, nose, mouth, throat, and face: Denies mucositis or sore throat (+) HOH Respiratory: Denies cough, dyspnea or wheezes Cardiovascular: Denies palpitation, chest discomfort or lower extremity swelling Gastrointestinal:  Denies heartburn or change in bowel habits (+) Tolerable upper abdominal pain (+) Nausea Skin: Denies abnormal skin rashes MSK: (+) Left hip discomfort from fall Lymphatics: Denies new lymphadenopathy or easy bruising Neurological:Denies numbness, tingling or new weaknesses Behavioral/Psych: Mood is stable, no new changes  All other systems were reviewed with the patient and are negative.  MEDICAL HISTORY:  Past Medical History:  Diagnosis Date  . Arthritis 05/26/11   osteoarthritis,lt. hip, and neck area  . Cancer ([place]) 2011-05-26   skin cancer lesions-multiple  . Cataract 2011-05-26   right  . Cholesterol serum elevated May 26, 2011   tx. Lipitor  . Expressive aphasia 10/2015  . Hearing loss 05/26/2011   wears hearing aids bilaterally  . Kidney calculi May 26, 2011   past hx. x1-passed on own, mild prostate issues-no meds  . PONV (postoperative nausea and vomiting) 05/26/2011   with cataract surgery  . Stroke [facility]) 10/2015    SURGICAL HISTORY: Past Surgical History:  Procedure Laterality Date  . BACK SURGERY  26-May-2011   Lumbar disc surgery-no hardware  . CATARACT EXTRACTION W/ INTRAOCULAR LENS IMPLANT  05-26-2011   left eye  . EP IMPLANTABLE DEVICE N/A 10/29/2015   Procedure: Loop Recorder Insertion;  Surgeon: Evans Lance, MD;  Location: [facility];  Service: Cardiovascular;  Laterality: N/A;  . EYE SURGERY     eye lid lift   . I&D EXTREMITY Right 01/17/2016   Procedure: IRRIGATION AND DEBRIDEMENT LEG LACERATION;  Surgeon: Vickey Huger, MD;  Location: [place];  Service: Orthopedics;  Laterality: Right;  . TEE WITHOUT CARDIOVERSION N/A 10/29/2015   Procedure: TRANSESOPHAGEAL ECHOCARDIOGRAM (TEE);  Surgeon: Larey Dresser, MD;  Location:  [place];  Service: Cardiovascular;  Laterality: N/A;  . TOTAL HIP ARTHROPLASTY  05/20/2011   Procedure: TOTAL HIP ARTHROPLASTY ANTERIOR APPROACH;  Surgeon: Mauri Pole, MD;  Location: [facility];  Service: Orthopedics;  Laterality: Left;    I have reviewed the social history and family history with the patient and they are unchanged from previous note.  ALLERGIES:  is allergic to codeine.  MEDICATIONS:  Current Outpatient Medications  Medication Sig Dispense Refill  . atorvastatin (LIPITOR) 40 MG tablet Take 40 mg by mouth daily.     . Coenzyme Q10 (COQ10) 100 MG CAPS Take 100 mg by mouth daily.    Marland Kitchen ELIQUIS 5 MG TABS tablet TAKE 1 TABLET BY MOUTH TWICE A DAY 180 tablet 2  . ondansetron (ZOFRAN) 8 MG tablet Take 1 tablet (8 mg total) by mouth 2 (two) times daily as needed (Nausea or vomiting). 30 tablet 1   No current facility-administered medications for this visit.     PHYSICAL EXAMINATION: ECOG PERFORMANCE STATUS: 2 - Symptomatic, <50% confined to bed  Vitals:   08/23/18 1355  BP: 122/83  Pulse: (!) 103  Resp: 18  Temp: (!) 97.4 F (36.3  C)  SpO2: 98%   Filed Weights   08/23/18 1355  Weight: 149 lb 11.2 oz (67.9 kg)    GENERAL:alert, no distress and comfortable SKIN: skin color, texture, turgor are normal, no rashes or significant lesions EYES: normal, Conjunctiva are pink and non-injected, sclera clear OROPHARYNX:no exudate, no erythema and lips, buccal mucosa, and tongue normal  NECK: supple, thyroid normal size, non-tender, without nodularity LYMPH:  no palpable lymphadenopathy in the cervical, axillary or inguinal LUNGS: clear to auscultation and percussion with normal breathing effort HEART: regular rhythm and no murmurs and no lower extremity edema (+) Atrial Fibrillation  ABDOMEN:abdomen soft, non-tender and normal bowel sounds (+) Hepatomegaly with palpable edge 5cm under ribcage and 8cm under midline, non-tender Musculoskeletal:no cyanosis of digits and no  clubbing  NEURO: alert & oriented x 3 with fluent speech, no focal motor/sensory deficits  LABORATORY DATA:  I have reviewed the data as listed CBC Latest Ref Rng & Units 08/23/2018 07/28/2018 01/17/2016  WBC 4.0 - 10.5 K/uL 6.8 7.0 3.9(L)  Hemoglobin 13.0 - 17.0 g/dL 10.6(L) 11.6(L) 12.2(L)  Hematocrit 39.0 - 52.0 % 32.7(L) 36.7(L) 37.0(L)  Platelets 150 - 400 K/uL 245 292 117(L)     CMP Latest Ref Rng & Units 08/23/2018 01/17/2016 01/17/2016  Glucose 70 - 99 mg/dL 106(H) 91 126(H)  BUN 8 - 23 mg/dL 23 24(H) 30(H)  Creatinine 0.61 - 1.24 mg/dL 0.77 1.02 1.00  Sodium 135 - 145 mmol/L 142 143 143  Potassium 3.5 - 5.1 mmol/L 4.0 4.3 4.2  Chloride 98 - 111 mmol/L 99 109 104  CO2 22 - 32 mmol/L 23 27 -  Calcium 8.9 - 10.3 mg/dL 9.5 8.8(L) -  Total Protein 6.5 - 8.1 g/dL 7.2 - -  Total Bilirubin 0.3 - 1.2 mg/dL 0.9 - -  Alkaline Phos 38 - 126 U/L 173(H) - -  AST 15 - 41 U/L 58(H) - -  ALT 0 - 44 U/L 24 - -      RADIOGRAPHIC STUDIES: I have personally reviewed the radiological images as listed and agreed with the findings in the report. No results found.   ASSESSMENT & PLAN:  RASHED EDLER is a 78 y.o. male with   1. Hepatocellular Carcinoma, cT3N0M0, stage IIIA -He was recently diagnosed in 07/2018.  -His CT AP from 07/03/18 shows multifocal large tumor in his live, mainly in left lobe. Biopsy from 07/28/18 showed [place]. There is also a 1.9 lesion seen on left kidney suspicions for kidney cancer.  No evidence of distant metastasis on stage CT scans.  -No history or image evidence of liver cirrhosis   -I discussed treatment options with him again.  -His case was discussed in our tumor board. Due to multifocal bilobular disease, large tumor burden, limited performance status and advanced age, he is not a candidate for surgical resection, liver transplant, or liver located therapy such as Y 90.   -We again reviewed the systemic therapy options of oral TKIs, IO and IO with Avastin. Due to large  tumor burden, I recommend Tecentriq and Avastin q3weeks based on the [id number] study result. I provided him with reading material on these medications.  Potential side effects, especially fatigue, endocrine disorders, pneumonitis, risk of bleeding, thrombosis, bowel perforation, proteinuria, renal failure, etc. were discussed with patient in great detail, chemo consent was obtained. -Discussed his cancer is incurable, and the goal of therapy is palliative to prolong his life and improve his quality of life. -Labs reviewed, CBC, CMP and Urine protein  WNL except hg 10.6, BG 106, AST 58, alk phos 173. TSH and AFP are still pending. Overall adequate to proceed with Tecentriq and Avastin today.  -Will do iron panel next visit to further evaluate mild anemia. I instructed him to start multi-vitamin for anemia.  -I encouraged him to eat well, sleep well, staying physically active and continuing COVID-19 precautions. -F/u in 3 weeks, e-visit next week for toxicity checkup  2. Lower appetite, weight loss  -He has lost 8-10 pounds in the past 2 months.  -Secondary to nausea, taste change and underlying malignancy  -He will continue Zofran as needed.  -dietician consult was previously set up.  -I encouraged him to eat smaller more frequent meals. I also encouraged him to take a multi-vitamin   3. Left Kidney lesion  -Seen on 07/03/18 CT AP which is suspicions for kidney cancer. We discussed that kidney cancer is usually slow growing. Further workup and treatment will be held at this time due to his incurable [place]. Will monitor closely for now.   4. AF -On Eliquis, follow-up with cardiology  5. Diffuse Upper abdominal pain, hepatomegaly  -Secondary to malignancy, tolerable and mainly with laying down. No pain medication needed.  -On exam today he has  Hepatomegaly with palpable edge 5cm under ribcage and 8cm under midline, non-tender (08/23/18)  6. Fall with mild left hip discomfort  -He tripped and  fell on left hip and knee today in [place] (08/23/18). He had a left hip replacement 5 years ago.  -He is able to walk with mild discomfort.  -I encouraged him to keep his body physically active, but to not over due it.  -I suggest he use a cane when ambulating outside of house.   7. Goal of care discussion  -We again discussed the incurable nature of his cancer, and the overall poor prognosis, especially if he does not have good response to therapy  -The patient understands the goal of care is palliative.   PLAN:  -Labs reviewed and adequate to proceed with cycle 1 Tecentriq and Avastin today  -e-visit next week for toxicity checkup -Lab, f/u and treatment with Atezolizumab and bevacizumab in 3 weeks  -I called pt's son Antonio Manning after his clinic visit and updated him    No problem-specific Assessment & Plan notes found for this encounter.   No orders of the defined types were placed in this encounter.  All questions were answered. The patient knows to call the clinic with any problems, questions or concerns. No barriers to learning was detected.  I spent 35 minutes counseling the patient face to face. The total time spent in the appointment was 45 minutes and more than 50% was on counseling and review of test results     [name], MD 08/23/2018   I, Joslyn Devon, am acting as scribe for [name], MD.   I have reviewed the above documentation for accuracy and completeness, and I agree with the above.

## 2018-08-23 ENCOUNTER — Other Ambulatory Visit: Payer: Medicare Other

## 2018-08-23 ENCOUNTER — Encounter: Payer: Self-pay | Admitting: Hematology

## 2018-08-23 ENCOUNTER — Telehealth: Payer: Self-pay

## 2018-08-23 ENCOUNTER — Ambulatory Visit: Payer: Medicare Other | Admitting: Hematology

## 2018-08-23 ENCOUNTER — Inpatient Hospital Stay: Payer: Medicare Other

## 2018-08-23 ENCOUNTER — Telehealth: Payer: Self-pay | Admitting: Hematology

## 2018-08-23 ENCOUNTER — Inpatient Hospital Stay: Payer: Medicare Other | Admitting: Hematology

## 2018-08-23 ENCOUNTER — Ambulatory Visit: Payer: Medicare Other

## 2018-08-23 ENCOUNTER — Telehealth: Payer: Self-pay | Admitting: *Deleted

## 2018-08-23 ENCOUNTER — Other Ambulatory Visit: Payer: Self-pay

## 2018-08-23 VITALS — BP 122/83 | HR 103 | Temp 97.4°F | Resp 18 | Ht 73.0 in | Wt 149.7 lb

## 2018-08-23 VITALS — HR 89

## 2018-08-23 DIAGNOSIS — R634 Abnormal weight loss: Secondary | ICD-10-CM | POA: Diagnosis not present

## 2018-08-23 DIAGNOSIS — R110 Nausea: Secondary | ICD-10-CM | POA: Diagnosis not present

## 2018-08-23 DIAGNOSIS — W010XXA Fall on same level from slipping, tripping and stumbling without subsequent striking against object, initial encounter: Secondary | ICD-10-CM | POA: Diagnosis not present

## 2018-08-23 DIAGNOSIS — C220 Liver cell carcinoma: Secondary | ICD-10-CM

## 2018-08-23 DIAGNOSIS — J439 Emphysema, unspecified: Secondary | ICD-10-CM | POA: Diagnosis not present

## 2018-08-23 DIAGNOSIS — M25562 Pain in left knee: Secondary | ICD-10-CM | POA: Diagnosis not present

## 2018-08-23 DIAGNOSIS — M129 Arthropathy, unspecified: Secondary | ICD-10-CM | POA: Diagnosis not present

## 2018-08-23 DIAGNOSIS — E039 Hypothyroidism, unspecified: Secondary | ICD-10-CM

## 2018-08-23 DIAGNOSIS — Z8673 Personal history of transient ischemic attack (TIA), and cerebral infarction without residual deficits: Secondary | ICD-10-CM | POA: Diagnosis not present

## 2018-08-23 DIAGNOSIS — Z9221 Personal history of antineoplastic chemotherapy: Secondary | ICD-10-CM | POA: Diagnosis not present

## 2018-08-23 DIAGNOSIS — D649 Anemia, unspecified: Secondary | ICD-10-CM | POA: Diagnosis not present

## 2018-08-23 DIAGNOSIS — I4892 Unspecified atrial flutter: Secondary | ICD-10-CM

## 2018-08-23 DIAGNOSIS — Z87442 Personal history of urinary calculi: Secondary | ICD-10-CM | POA: Diagnosis not present

## 2018-08-23 DIAGNOSIS — I714 Abdominal aortic aneurysm, without rupture: Secondary | ICD-10-CM | POA: Diagnosis not present

## 2018-08-23 DIAGNOSIS — R5383 Other fatigue: Secondary | ICD-10-CM | POA: Diagnosis not present

## 2018-08-23 DIAGNOSIS — Z7901 Long term (current) use of anticoagulants: Secondary | ICD-10-CM | POA: Diagnosis not present

## 2018-08-23 DIAGNOSIS — Z79899 Other long term (current) drug therapy: Secondary | ICD-10-CM | POA: Diagnosis not present

## 2018-08-23 DIAGNOSIS — Z5112 Encounter for antineoplastic immunotherapy: Secondary | ICD-10-CM | POA: Diagnosis not present

## 2018-08-23 DIAGNOSIS — Z85828 Personal history of other malignant neoplasm of skin: Secondary | ICD-10-CM | POA: Diagnosis not present

## 2018-08-23 LAB — CMP (CANCER CENTER ONLY)
ALT: 24 U/L (ref 0–44)
AST: 58 U/L — ABNORMAL HIGH (ref 15–41)
Albumin: 4.3 g/dL (ref 3.5–5.0)
Alkaline Phosphatase: 173 U/L — ABNORMAL HIGH (ref 38–126)
Anion gap: 20 — ABNORMAL HIGH (ref 5–15)
BUN: 23 mg/dL (ref 8–23)
CO2: 23 mmol/L (ref 22–32)
Calcium: 9.5 mg/dL (ref 8.9–10.3)
Chloride: 99 mmol/L (ref 98–111)
Creatinine: 0.77 mg/dL (ref 0.61–1.24)
GFR, Est AFR Am: >60 mL/min (ref >60)
GFR, Estimated: >60 mL/min (ref >60)
Glucose, Bld: 106 mg/dL — ABNORMAL HIGH (ref 70–99)
Potassium: 4 mmol/L (ref 3.5–5.1)
Sodium: 142 mmol/L (ref 135–145)
Total Bilirubin: 0.9 mg/dL (ref 0.3–1.2)
Total Protein: 7.2 g/dL (ref 6.5–8.1)

## 2018-08-23 LAB — CBC WITH DIFFERENTIAL (CANCER CENTER ONLY)
Abs Immature Granulocytes: 0.06 10*3/uL (ref 0.00–0.07)
Basophils Absolute: 0.1 10*3/uL (ref 0.0–0.1)
Basophils Relative: 1 %
Eosinophils Absolute: 0.1 10*3/uL (ref 0.0–0.5)
Eosinophils Relative: 1 %
HCT: 32.7 % — ABNORMAL LOW (ref 39.0–52.0)
Hemoglobin: 10.6 g/dL — ABNORMAL LOW (ref 13.0–17.0)
Immature Granulocytes: 1 %
Lymphocytes Relative: 17 %
Lymphs Abs: 1.2 10*3/uL (ref 0.7–4.0)
MCH: 30.6 pg (ref 26.0–34.0)
MCHC: 32.4 g/dL (ref 30.0–36.0)
MCV: 94.5 fL (ref 80.0–100.0)
Monocytes Absolute: 0.6 10*3/uL (ref 0.1–1.0)
Monocytes Relative: 8 %
Neutro Abs: 4.9 10*3/uL (ref 1.7–7.7)
Neutrophils Relative %: 72 %
Platelet Count: 245 10*3/uL (ref 150–400)
RBC: 3.46 MIL/uL — ABNORMAL LOW (ref 4.22–5.81)
RDW: 16.3 % — ABNORMAL HIGH (ref 11.5–15.5)
WBC Count: 6.8 10*3/uL (ref 4.0–10.5)
nRBC: 0 % (ref 0.0–0.2)

## 2018-08-23 LAB — TOTAL PROTEIN, URINE DIPSTICK: Protein, ur: 30 mg/dL — AB

## 2018-08-23 LAB — TSH: TSH: 9.397 u[IU]/mL — ABNORMAL HIGH (ref 0.320–4.118)

## 2018-08-23 MED ORDER — SODIUM CHLORIDE 0.9 % IV SOLN
Freq: Once | INTRAVENOUS | Status: AC
  Administered 2018-08-23: 15:00:00 via INTRAVENOUS
  Filled 2018-08-23: qty 250

## 2018-08-23 MED ORDER — SODIUM CHLORIDE 0.9 % IV SOLN
14.2000 mg/kg | Freq: Once | INTRAVENOUS | Status: AC
  Administered 2018-08-23: 1000 mg via INTRAVENOUS
  Filled 2018-08-23: qty 32

## 2018-08-23 MED ORDER — SODIUM CHLORIDE 0.9 % IV SOLN
1200.0000 mg | Freq: Once | INTRAVENOUS | Status: AC
  Administered 2018-08-23: 1200 mg via INTRAVENOUS
  Filled 2018-08-23: qty 20

## 2018-08-23 NOTE — Telephone Encounter (Signed)
Called and scheduled appt per 4/22 los.  Patient wife aware of scheduled appts.

## 2018-08-23 NOTE — Telephone Encounter (Signed)
Faxed office visit note from 08/09/2018 to Dr. Janace Litten Little's office.

## 2018-08-23 NOTE — Patient Instructions (Signed)
[facility] Discharge Instructions for Patients Receiving Chemotherapy  Today you received the following chemotherapy agents Tecentriq; Avastin  To help prevent nausea and vomiting after your treatment, we encourage you to take your nausea medication as directed.  If you develop nausea and vomiting that is not controlled by your nausea medication, call the clinic.   BELOW ARE SYMPTOMS THAT SHOULD BE REPORTED IMMEDIATELY:  *FEVER GREATER THAN 100.5 F  *CHILLS WITH OR WITHOUT FEVER  NAUSEA AND VOMITING THAT IS NOT CONTROLLED WITH YOUR NAUSEA MEDICATION  *UNUSUAL SHORTNESS OF BREATH  *UNUSUAL BRUISING OR BLEEDING  TENDERNESS IN MOUTH AND THROAT WITH OR WITHOUT PRESENCE OF ULCERS  *URINARY PROBLEMS  *BOWEL PROBLEMS  UNUSUAL RASH Items with * indicate a potential emergency and should be followed up as soon as possible.  Feel free to call the clinic should you have any questions or concerns. The clinic phone number is [phone].  Please show the [place] at check-in to the Emergency Department and triage nurse.   Atezolizumab injection What is this medicine? ATEZOLIZUMAB (a te zoe LIZ ue mab) is a monoclonal antibody. It is used to treat bladder cancer (urothelial cancer), non-small cell lung cancer, small cell lung cancer, and breast cancer. This medicine may be used for other purposes; ask your health care provider or pharmacist if you have questions. COMMON BRAND NAME(S): Tecentriq What should I tell my health care provider before I take this medicine? They need to know if you have any of these conditions: -diabetes -immune system problems -infection -inflammatory bowel disease -liver disease -lung or breathing disease -lupus -nervous system problems like myasthenia gravis or Guillain-Barre syndrome -organ transplant -an unusual or allergic reaction to atezolizumab, other medicines, foods, dyes, or preservatives -pregnant or trying to get  pregnant -breast-feeding How should I use this medicine? This medicine is for infusion into a vein. It is given by a health care professional in a hospital or clinic setting. A special MedGuide will be given to you before each treatment. Be sure to read this information carefully each time. Talk to your pediatrician regarding the use of this medicine in children. Special care may be needed. Overdosage: If you think you have taken too much of this medicine contact a poison control center or emergency room at once. NOTE: This medicine is only for you. Do not share this medicine with others. What if I miss a dose? It is important not to miss your dose. Call your doctor or health care professional if you are unable to keep an appointment. What may interact with this medicine? Interactions have not been studied. This list may not describe all possible interactions. Give your health care provider a list of all the medicines, herbs, non-prescription drugs, or dietary supplements you use. Also tell them if you smoke, drink alcohol, or use illegal drugs. Some items may interact with your medicine. What should I watch for while using this medicine? Your condition will be monitored carefully while you are receiving this medicine. You may need blood work done while you are taking this medicine. Do not become pregnant while taking this medicine or for at least 5 months after stopping it. Women should inform their doctor if they wish to become pregnant or think they might be pregnant. There is a potential for serious side effects to an unborn child. Talk to your health care professional or pharmacist for more information. Do not breast-feed an infant while taking this medicine or for at least 5  months after the last dose. What side effects may I notice from receiving this medicine? Side effects that you should report to your doctor or health care professional as soon as possible: -allergic reactions like skin  rash, itching or hives, swelling of the face, lips, or tongue -black, tarry stools -bloody or watery diarrhea -breathing problems -changes in vision -chest pain or chest tightness -chills -facial flushing -fever -headache -signs and symptoms of high blood sugar such as dizziness; dry mouth; dry skin; fruity breath; nausea; stomach pain; increased hunger or thirst; increased urination -signs and symptoms of liver injury like dark yellow or brown urine; general ill feeling or flu-like symptoms; light-colored stools; loss of appetite; nausea; right upper belly pain; unusually weak or tired; yellowing of the eyes or skin -stomach pain -trouble passing urine or change in the amount of urine Side effects that usually do not require medical attention (report to your doctor or health care professional if they continue or are bothersome): -cough -diarrhea -joint pain -muscle pain -muscle weakness -tiredness -weight loss This list may not describe all possible side effects. Call your doctor for medical advice about side effects. You may report side effects to FDA at 1-800-FDA-1088. Where should I keep my medicine? This drug is given in a hospital or clinic and will not be stored at home. NOTE: This sheet is a summary. It may not cover all possible information. If you have questions about this medicine, talk to your doctor, pharmacist, or health care provider.  2019 Elsevier/Gold Standard (2017-07-22 09:33:38)   Bevacizumab injection What is this medicine? BEVACIZUMAB (be va SIZ yoo mab) is a monoclonal antibody. It is used to treat many types of cancer. This medicine may be used for other purposes; ask your health care provider or pharmacist if you have questions. COMMON BRAND NAME(S): Avastin, MVASI What should I tell my health care provider before I take this medicine? They need to know if you have any of these conditions: -diabetes -heart disease -high blood pressure -history of  coughing up blood -prior anthracycline chemotherapy (e.g., doxorubicin, daunorubicin, epirubicin) -recent or ongoing radiation therapy -recent or planning to have surgery -stroke -an unusual or allergic reaction to bevacizumab, hamster proteins, mouse proteins, other medicines, foods, dyes, or preservatives -pregnant or trying to get pregnant -breast-feeding How should I use this medicine? This medicine is for infusion into a vein. It is given by a health care professional in a hospital or clinic setting. Talk to your pediatrician regarding the use of this medicine in children. Special care may be needed. Overdosage: If you think you have taken too much of this medicine contact a poison control center or emergency room at once. NOTE: This medicine is only for you. Do not share this medicine with others. What if I miss a dose? It is important not to miss your dose. Call your doctor or health care professional if you are unable to keep an appointment. What may interact with this medicine? Interactions are not expected. This list may not describe all possible interactions. Give your health care provider a list of all the medicines, herbs, non-prescription drugs, or dietary supplements you use. Also tell them if you smoke, drink alcohol, or use illegal drugs. Some items may interact with your medicine. What should I watch for while using this medicine? Your condition will be monitored carefully while you are receiving this medicine. You will need important blood work and urine testing done while you are taking this medicine. This medicine may increase  your risk to bruise or bleed. Call your doctor or health care professional if you notice any unusual bleeding. This medicine should be started at least 28 days following major surgery and the site of the surgery should be totally healed. Check with your doctor before scheduling dental work or surgery while you are receiving this treatment. Talk to your  doctor if you have recently had surgery or if you have a wound that has not healed. Do not become pregnant while taking this medicine or for 6 months after stopping it. Women should inform their doctor if they wish to become pregnant or think they might be pregnant. There is a potential for serious side effects to an unborn child. Talk to your health care professional or pharmacist for more information. Do not breast-feed an infant while taking this medicine and for 6 months after the last dose. This medicine has caused ovarian failure in some women. This medicine may interfere with the ability to have a child. You should talk to your doctor or health care professional if you are concerned about your fertility. What side effects may I notice from receiving this medicine? Side effects that you should report to your doctor or health care professional as soon as possible: -allergic reactions like skin rash, itching or hives, swelling of the face, lips, or tongue -chest pain or chest tightness -chills -coughing up blood -high fever -seizures -severe constipation -signs and symptoms of bleeding such as bloody or black, tarry stools; red or dark-brown urine; spitting up blood or brown material that looks like coffee grounds; red spots on the skin; unusual bruising or bleeding from the eye, gums, or nose -signs and symptoms of a blood clot such as breathing problems; chest pain; severe, sudden headache; pain, swelling, warmth in the leg -signs and symptoms of a stroke like changes in vision; confusion; trouble speaking or understanding; severe headaches; sudden numbness or weakness of the face, arm or leg; trouble walking; dizziness; loss of balance or coordination -stomach pain -sweating -swelling of legs or ankles -vomiting -weight gain Side effects that usually do not require medical attention (report to your doctor or health care professional if they continue or are bothersome): -back  pain -changes in taste -decreased appetite -dry skin -nausea -tiredness This list may not describe all possible side effects. Call your doctor for medical advice about side effects. You may report side effects to FDA at 1-800-FDA-1088. Where should I keep my medicine? This drug is given in a hospital or clinic and will not be stored at home. NOTE: This sheet is a summary. It may not cover all possible information. If you have questions about this medicine, talk to your doctor, pharmacist, or health care provider.  2019 Elsevier/Gold Standard (2016-04-16 14:33:29)

## 2018-08-23 NOTE — Telephone Encounter (Signed)
Met pt in infusion room & discussed Pt Ed materials & discussed/reviewed avastin & Tecentriq.  Education Binder with needed info given to pt.  Questions answered & pt expressed appreciation for review.

## 2018-08-24 LAB — AFP TUMOR MARKER

## 2018-08-25 ENCOUNTER — Telehealth: Payer: Self-pay | Admitting: *Deleted

## 2018-08-25 ENCOUNTER — Other Ambulatory Visit: Payer: Self-pay

## 2018-08-25 ENCOUNTER — Ambulatory Visit (INDEPENDENT_AMBULATORY_CARE_PROVIDER_SITE_OTHER): Payer: Medicare Other | Admitting: *Deleted

## 2018-08-25 ENCOUNTER — Other Ambulatory Visit: Payer: Self-pay | Admitting: *Deleted

## 2018-08-25 DIAGNOSIS — I480 Paroxysmal atrial fibrillation: Secondary | ICD-10-CM

## 2018-08-25 DIAGNOSIS — E039 Hypothyroidism, unspecified: Secondary | ICD-10-CM

## 2018-08-25 LAB — CUP PACEART REMOTE DEVICE CHECK
Date Time Interrogation Session: 20200424174142
Implantable Pulse Generator Implant Date: 20170628

## 2018-08-25 LAB — T4, FREE: Free T4: 0.73 ng/dL — ABNORMAL LOW (ref 0.82–1.77)

## 2018-08-25 NOTE — Telephone Encounter (Signed)
Received vm message from patient's wife requesting a call back. TCT wife. And spoke with her. She wanted a few things corrected in her husband's chart. His cardiologist is Crissie Sickles @ [phone] Also he is taking Vitamin C and a multi-vitamin.  Also she states that Antonio Manning has experienced some abdominal pain-not stomach upset/heartburn type pain but more of a muscle ache near site of biopsy.  Spoke with Dr. Burr Medico about this.  She advised that pt can take 2 Tylenol 500 mg/day only due to liver cancer.  Dr. Burr Medico stated that couyld call in Tramadol if pt wanted this.  Wife states he will try the Tylenol first. No other questions at this time.

## 2018-08-25 NOTE — Addendum Note (Signed)
Addended by: [name] on: 08/25/2018 04:29 PM   Modules accepted: Orders

## 2018-08-28 ENCOUNTER — Telehealth: Payer: Self-pay | Admitting: *Deleted

## 2018-08-28 ENCOUNTER — Encounter: Payer: Self-pay | Admitting: Pharmacy Technician

## 2018-08-28 ENCOUNTER — Telehealth: Payer: Self-pay | Admitting: Hematology

## 2018-08-28 NOTE — Progress Notes (Signed)
The patient is approved for drug assistance from [place] for Avastin and Tecentriq. Enrollment is based on off label compassion use and is effective until 08/23/19. Replacement will begin for DOS 08/23/18.

## 2018-08-28 NOTE — Progress Notes (Signed)
[facility]   Telephone:([phone]) [phone] [phone]) [phone]   Clinic Follow up Note   Patient Care Team: Hulan Fess, MD as PCP - General (Family Medicine) Arna Snipe, RN as Oncology Nurse Navigator Evans Lance, MD as Consulting Physician (Cardiology)   I connected with Antonio Manning on 08/30/2018 at 10:15 AM EDT by video enabled telemedicine visit and verified that I am speaking with the correct person using two identifiers.  I discussed the limitations, risks, security and privacy concerns of performing an evaluation and management service by telephone and the availability of in person appointments. I also discussed with the patient that there may be a patient responsible charge related to this service. The patient expressed understanding and agreed to proceed.   Other persons participating in the visit and their role in the encounter:  His daughter and wife  Patient's location:  His home  Provider's location:  My Office  CHIEF COMPLAINT:  F/u of newly diagnosed [place]  SUMMARY OF ONCOLOGIC HISTORY:   Hepatocellular carcinoma ([place])   07/03/2018 Imaging    CT AP W Contrast 07/03/18  IMPRESSION: Innumerable hepatic masses, predominantly involving the left hepatic lobe, including a dominant 8.9 cm mass in segment 4. This appearance is suspicious for metastatic disease versus multifocal HCC.  1.9 cm enhancing lesion in the posterior left upper kidney, suspicious for solid renal neoplasm such as renal cell carcinoma. Given the additional findings, this is of questionable clinical significance. Small volume abdominopelvic ascites.     07/28/2018 Initial Biopsy    Diagnosis 07/28/18 Liver, needle/core biopsy, Left lobe - HEPATOCELLULAR CARCINOMA. SEE COMMENT.    07/28/2018 Cancer Staging    Staging form: Liver, AJCC 8th Edition - Clinical stage from 07/28/2018: Stage IIIA (cT3, cN0, cM0) - Signed by [name], MD on 08/09/2018    08/04/2018 Initial Diagnosis   Hepatocellular carcinoma ([place])    08/13/2018 Imaging    CT Chest 08/13/18  IMPRESSION: 1. No evidence of thoracic metastatic disease. 2. Emphysema with right middle lobe bronchiectasis. 3. Ascending thoracic aortic aneurysm. Recommend semi-annual imaging followup by CTA or MRA and referral to cardiothoracic surgery if not already obtained. This recommendation follows 2010 ACCF/AHA/AATS/ACR/ASA/SCA/SCAI/SIR/STS/SVM Guidelines for the Diagnosis and Management of Patients With Thoracic Aortic Disease. Circulation. 2010; 121: F638-G665 4. Multicentric hepatocellular carcinoma with involvement of the right lobe. ADDENDUM: incidental finding in the original report. There is a subcutaneous lesion in the upper back measuring 2.0 cm and 2 HU on image 4/2, likely an incidental an epidermal inclusion (sebaceous) cyst.    08/21/2018 -  Chemotherapy    Tecentriq (Atezolizumab) and Avastin (bevacizumab) starting 08/23/18      CURRENT THERAPY:  Tecentriq (Atezolizumab) 1200mg  and Avastin (bevacizumab) 15mg /kg every 3 weeks starting 08/23/18   INTERVAL HISTORY:  Antonio Manning is here for a follow up of treatment. He was able to identify himself by face-to-face video. He notes he is doing well and better after first infusion. He notes last week after infusion he had constant pain over a few days at his biopsy site. The pain has now subsided. He notes his constipation has improved and he was able to have a bowel movement yesterday. He notes he has not been drinking adequately. He notes his appetite comes and goes. He is willing to take ensure.  He notes being more SOB in the morning. He feels he was able to be more active yesterday. He notes his energy level has improved since last week. His daughter notes  he has been able to ride the bike.  His daughter note she has bruising over his clavicle, he denies any pain from this. He denies any other bleeding.   REVIEW OF SYSTEMS:   Constitutional: Denies  fevers, chills or abnormal weight loss (+) fair appetite  Eyes: Denies blurriness of vision Ears, nose, mouth, throat, and face: Denies mucositis or sore throat Respiratory: Denies cough or wheezes (+) SOB in the morning, improving.  Cardiovascular: Denies palpitation, chest discomfort or lower extremity swelling Gastrointestinal:  Denies nausea, heartburn (+) Constipation, improved  Skin: Denies abnormal skin rashes Lymphatics: Denies new lymphadenopathy (+) Bruising along clavicle  Neurological:Denies numbness, tingling or new weaknesses Behavioral/Psych: Mood is stable, no new changes  All other systems were reviewed with the patient and are negative.  MEDICAL HISTORY:  Past Medical History:  Diagnosis Date  . Arthritis 2011-06-08   osteoarthritis,lt. hip, and neck area  . Cancer ([place]) 06/08/11   skin cancer lesions-multiple  . Cataract 06/08/11   right  . Cholesterol serum elevated 06/08/2011   tx. Lipitor  . Expressive aphasia 10/2015  . Hearing loss 06/08/2011   wears hearing aids bilaterally  . Kidney calculi 06/08/11   past hx. x1-passed on own, mild prostate issues-no meds  . PONV (postoperative nausea and vomiting) Jun 08, 2011   with cataract surgery  . Stroke [facility]) 10/2015    SURGICAL HISTORY: Past Surgical History:  Procedure Laterality Date  . BACK SURGERY  06-08-2011   Lumbar disc surgery-no hardware  . CATARACT EXTRACTION W/ INTRAOCULAR LENS IMPLANT  06/08/2011   left eye  . EP IMPLANTABLE DEVICE N/A 10/29/2015   Procedure: Loop Recorder Insertion;  Surgeon: Evans Lance, MD;  Location: [facility];  Service: Cardiovascular;  Laterality: N/A;  . EYE SURGERY     eye lid lift   . I&D EXTREMITY Right 01/17/2016   Procedure: IRRIGATION AND DEBRIDEMENT LEG LACERATION;  Surgeon: Vickey Huger, MD;  Location: [facility];  Service: Orthopedics;  Laterality: Right;  . TEE WITHOUT CARDIOVERSION N/A 10/29/2015   Procedure: TRANSESOPHAGEAL ECHOCARDIOGRAM (TEE);  Surgeon: Larey Dresser, MD;   Location: [facility];  Service: Cardiovascular;  Laterality: N/A;  . TOTAL HIP ARTHROPLASTY  05/20/2011   Procedure: TOTAL HIP ARTHROPLASTY ANTERIOR APPROACH;  Surgeon: Mauri Pole, MD;  Location: [facility];  Service: Orthopedics;  Laterality: Left;    I have reviewed the social history and family history with the patient and they are unchanged from previous note.  ALLERGIES:  is allergic to codeine.  MEDICATIONS:  Current Outpatient Medications  Medication Sig Dispense Refill  . atorvastatin (LIPITOR) 40 MG tablet Take 40 mg by mouth daily.     . Coenzyme Q10 (COQ10) 100 MG CAPS Take 100 mg by mouth daily.    Marland Kitchen ELIQUIS 5 MG TABS tablet TAKE 1 TABLET BY MOUTH TWICE A DAY 180 tablet 2  . Multiple Vitamin (MULTIVITAMIN) tablet Take 1 tablet by mouth daily.    . ondansetron (ZOFRAN) 8 MG tablet Take 1 tablet (8 mg total) by mouth 2 (two) times daily as needed (Nausea or vomiting). 30 tablet 1  . vitamin C (ASCORBIC ACID) 250 MG tablet Take 250 mg by mouth daily.     No current facility-administered medications for this visit.     PHYSICAL EXAMINATION: ECOG PERFORMANCE STATUS: 2 - Symptomatic, <50% confined to bed  No vitals taken today, Exam not performed today  LABORATORY DATA:  I have reviewed the data as listed CBC Latest Ref Rng &  Units 08/23/2018 07/28/2018 01/17/2016  WBC 4.0 - 10.5 K/uL 6.8 7.0 3.9(L)  Hemoglobin 13.0 - 17.0 g/dL 10.6(L) 11.6(L) 12.2(L)  Hematocrit 39.0 - 52.0 % 32.7(L) 36.7(L) 37.0(L)  Platelets 150 - 400 K/uL 245 292 117(L)     CMP Latest Ref Rng & Units 08/23/2018 01/17/2016 01/17/2016  Glucose 70 - 99 mg/dL 106(H) 91 126(H)  BUN 8 - 23 mg/dL 23 24(H) 30(H)  Creatinine 0.61 - 1.24 mg/dL 0.77 1.02 1.00  Sodium 135 - 145 mmol/L 142 143 143  Potassium 3.5 - 5.1 mmol/L 4.0 4.3 4.2  Chloride 98 - 111 mmol/L 99 109 104  CO2 22 - 32 mmol/L 23 27 -  Calcium 8.9 - 10.3 mg/dL 9.5 8.8(L) -  Total Protein 6.5 - 8.1 g/dL 7.2 - -  Total Bilirubin 0.3 - 1.2 mg/dL  0.9 - -  Alkaline Phos 38 - 126 U/L 173(H) - -  AST 15 - 41 U/L 58(H) - -  ALT 0 - 44 U/L 24 - -      RADIOGRAPHIC STUDIES: I have personally reviewed the radiological images as listed and agreed with the findings in the report. No results found.   ASSESSMENT & PLAN:  Antonio Manning is a 78 y.o. male with   1. Hepatocellular Carcinoma, cT3N0M0, stage IIIA -He was recently diagnosed in 07/2018.  -His CT AP from 07/03/18 shows multifocal large tumor in his live, mainly in left lobe. Biopsy from 07/28/18 showed [place]. There is also a 1.9 lesion seen on left kidney suspicions for kidney cancer.No evidence of distant metastasis on stage CT scans.  -No history or image evidence of liver cirrhosis   -His case was previously discussed in our tumor board. Due to multifocal bilobular disease, large tumor burden, limited performance status and advanced age, he is not a candidate for surgical resection, liver transplant, or liver located therapy such as Y 90.   -Due to large tumor burden, he started First-line Tecentriq and Avastin q3weeks based on theIMbrave150study result on 08/23/18.  -He tolerated first cycle moderately well with constipation, mild bruising along clavicle, fatigue and lower appetite. He was able to recover well. I encouraged him to utilize stool softens and monitor for pain and signs of bleeding.  -Will f/u with next treatment in 2 weeks   2. Lower appetite, weight loss  -He has lost 8-10 pounds in a 2 month span.  -Secondary to nausea, taste change and underlying malignancy -He will continue Zofran as needed.  -dietician consultwas previously set up.  -I encouraged him to eat smaller more frequent meals. I also encouraged him to take a multi-vitamin  3. Left Kidney lesion  -Seen on 07/03/18 CT AP which is suspicions for kidney cancer. We discussed that kidney cancer is usually slow growing. Further workup and treatment will be held at this time due to his incurable [place]. Will  monitor closely for now.  4. AF -On Eliquis, follow-up withcardiology  5. Diffuse Upper abdominal pain and constipation  -Secondary to malignancy, tolerable and mainly with laying down. He has not been taking pain meds  -He had Hepatomegaly on 08/23/18 exam.  -He had a worsening abdominal pain after the first cycle treatment, possible related to constipation or cancer, I encouraged him to use laxative, and Tylenol as needed with no more than 3 tablets a day.  6. Fall with mild left hip discomfort  -He tripped and fell on left hip and knee in [place] on 08/23/18. He had a left hip replacement  5 years ago.  -He is able to walk with mild discomfort.  -I encouraged him to keep his body physically active, but to not over due it.  -I previously suggested he use a cane when ambulating outside of house.   7. Goal of care discussion  -We again discussed the incurable nature of his cancer, and the overall poor prognosis, especially if he does not have good response to therapy  -The patient understands the goal of care is palliative.   PLAN: -He tolerated first cycle moderately well, pain resolved, overall improved  -Lab, f/u and treatmentwith Atezolizumab andbevacizumabin 2 weeks     No problem-specific Assessment & Plan notes found for this encounter.   No orders of the defined types were placed in this encounter.  I discussed the assessment and treatment plan with the patient. The patient was provided an opportunity to ask questions and all were answered. The patient agreed with the plan and demonstrated an understanding of the instructions.  The patient was advised to call back or seek an in-person evaluation if the symptoms worsen or if the condition fails to improve as anticipated.  I provided 15 minutes of face-to-face video visit time during this encounter, and > 50% was spent counseling as documented under my assessment & plan.    [name], MD 08/30/2018   I, Joslyn Devon, am acting as scribe for [name], MD.   I have reviewed the above documentation for accuracy and completeness, and I agree with the above.

## 2018-08-28 NOTE — Telephone Encounter (Signed)
-----   Message from [name], MD sent at 08/27/2018  6:20 PM EDT ----- Please let pt know that his lab result showed he has hypothyroidism, which causes fatigue. Please fax the results to his PCP Dr. Rex Kras to see if he will start him on thyroid replacement. Thanks  [name]  08/27/2018

## 2018-08-28 NOTE — Telephone Encounter (Signed)
TCT patient regarding lab results. Spoke with pt's wife. She states her husband is hard of hearing-esp on the phone. Reviewed thyroid studies with her and informed her that the results have been sent to Dr. Eddie Dibbles office.  She states that she has heard from Dr. Eddie Dibbles office already and has been told he wants to wait a bit before starting on thyroid replacement meds. He wants to see how the levels settle out after cycle or two of chemotherapy.  Mrs. Antonio Manning is aware of his upcoming appts.

## 2018-08-28 NOTE — Telephone Encounter (Signed)
Called regarding upcoming Webex appointment, spoke with patient's daughter and she will help set up Webex meeting for patient. Invite sent.

## 2018-08-29 ENCOUNTER — Telehealth: Payer: Self-pay | Admitting: *Deleted

## 2018-08-29 NOTE — Telephone Encounter (Signed)
Called Antonio Manning for chemotherapy F/U.  Spouse Antonio Manning reports "good Wed, Thurs.  Friday am must have pulled something left side, 3" below biopsy area, yelling.  Better, using Tylenol, sitting in chair because ut hurts too bad to lie down.  Constipated bowel movement yesterday was large ball.  Passing gas occasionally.  Will ask daughter get stool softener tomorrow.  Emptying bladder well.  Eating sometimes but not drinking much at all.  These are not new symptoms.  No treatment side effects.  Rode stationary bike for five minutes."   Reviewed foods and beverages to help constipation.  Drink 64 oz minimum daily or at least the day before, of and after treatment but offer hourly helps.  Denies questions or needs at this time.  Encouraged to call [phone] Mon -Fri 8:00 am - 4:30 pm or anytime as needed for symptoms, changes or event outside office hours.

## 2018-08-29 NOTE — Telephone Encounter (Signed)
Thanks for the call, I will talk to them tomorrow.   [name] MD

## 2018-08-29 NOTE — Telephone Encounter (Signed)
-----   Message from Priscille Loveless, RN sent at 08/23/2018  4:22 PM EDT ----- Regarding: First Chemo Follow-up Feng First chemo follow up, Tecentriq; avastin

## 2018-08-29 NOTE — Telephone Encounter (Signed)
Received fax'd messages from [facility] from the weekend that patient was experiencing abdominal pain near the site of his liver biposy after his 1st treatment last week with Tecentriq and Avastin. [facility] advised that he should go to ED  However pt refused.  Son, Antonio Manning was seeking clarification on when to go to ED, side effects of chemo and just general information on what to expect after chemo.  TCT son, Antonio Manning.  Spoke with him at length regarding potential side effects of chemo. Explained that it seems likely that most of SE his father experienced over the weekend were related to chemo. [name] states that the abdominal pain is subsiding, but that pt does not have much appetite, is having some constipation issues as well as not drinking enough fluids.  Antonio Manning states his father c/o dry mouth as well. Denies fever, chills, nausea, vomiting.   Reviewed importance of staying hydrated and doing the best he can at with nutrition-trying different foods that may appeal to him etc. They have Ensure at home but pt has not tried yet. Voiding well.  Provided assurance that pt most likely did not need to go to ED over the weekend as he is feeling somewhat better today. Antonio Manning is also concerned about pain management. His father is reluctant to take medication overall. Due to liver cancer pt should not use Tylenol. [name] asked about Tramadol. Informed [name] that I would pass on his concerns to Dr. Burr Medico.  Pt has [facility] meeting with Dr. Burr Medico tomorrow. Pt's wife and daughter will also be participating in that visit. Encouraged [name] to share his concerns with them and to have a list of questions ready for that appt.  Antonio Manning will be taking some time from his job to spend with his parents and he is hoping that will help to level out the concerns at home.  [name] voiced appreciation for call

## 2018-08-30 ENCOUNTER — Encounter: Payer: Self-pay | Admitting: Hematology

## 2018-08-30 ENCOUNTER — Telehealth: Payer: Self-pay | Admitting: Hematology

## 2018-08-30 ENCOUNTER — Inpatient Hospital Stay (HOSPITAL_BASED_OUTPATIENT_CLINIC_OR_DEPARTMENT_OTHER): Payer: Medicare Other | Admitting: Hematology

## 2018-08-30 DIAGNOSIS — I4892 Unspecified atrial flutter: Secondary | ICD-10-CM

## 2018-08-30 DIAGNOSIS — Z8673 Personal history of transient ischemic attack (TIA), and cerebral infarction without residual deficits: Secondary | ICD-10-CM

## 2018-08-30 DIAGNOSIS — C220 Liver cell carcinoma: Secondary | ICD-10-CM | POA: Diagnosis not present

## 2018-08-30 NOTE — Telephone Encounter (Signed)
No los per 4/29. °

## 2018-08-31 ENCOUNTER — Telehealth: Payer: Self-pay

## 2018-08-31 NOTE — Telephone Encounter (Signed)
Patient's wife called stating reddened area at coccyx, not open or cracked, patient has been sitting a lot.  I advised her to use A&D ointment to area, he needs to shift positions frequently, she states he has been sitting in the recliner a lot.  She verbalized an understanding.

## 2018-09-01 NOTE — Progress Notes (Signed)
[facility] Summary Report / Loop Recorder 

## 2018-09-06 ENCOUNTER — Encounter: Payer: Self-pay | Admitting: Hematology

## 2018-09-06 DIAGNOSIS — Z7189 Other specified counseling: Secondary | ICD-10-CM | POA: Insufficient documentation

## 2018-09-13 ENCOUNTER — Inpatient Hospital Stay: Payer: Medicare Other

## 2018-09-13 ENCOUNTER — Telehealth: Payer: Self-pay

## 2018-09-13 ENCOUNTER — Other Ambulatory Visit: Payer: Self-pay

## 2018-09-13 ENCOUNTER — Inpatient Hospital Stay (HOSPITAL_BASED_OUTPATIENT_CLINIC_OR_DEPARTMENT_OTHER): Payer: Medicare Other | Admitting: Nurse Practitioner

## 2018-09-13 ENCOUNTER — Inpatient Hospital Stay: Payer: Medicare Other | Attending: Hematology

## 2018-09-13 ENCOUNTER — Telehealth: Payer: Self-pay | Admitting: Nurse Practitioner

## 2018-09-13 ENCOUNTER — Encounter: Payer: Self-pay | Admitting: Hematology

## 2018-09-13 ENCOUNTER — Encounter: Payer: Self-pay | Admitting: Nurse Practitioner

## 2018-09-13 VITALS — BP 143/96

## 2018-09-13 VITALS — BP 126/93 | HR 69 | Temp 97.7°F | Resp 18 | Ht 73.0 in | Wt 149.0 lb

## 2018-09-13 DIAGNOSIS — R188 Other ascites: Secondary | ICD-10-CM

## 2018-09-13 DIAGNOSIS — Z5112 Encounter for antineoplastic immunotherapy: Secondary | ICD-10-CM | POA: Insufficient documentation

## 2018-09-13 DIAGNOSIS — E039 Hypothyroidism, unspecified: Secondary | ICD-10-CM | POA: Insufficient documentation

## 2018-09-13 DIAGNOSIS — C220 Liver cell carcinoma: Secondary | ICD-10-CM | POA: Diagnosis not present

## 2018-09-13 DIAGNOSIS — R600 Localized edema: Secondary | ICD-10-CM | POA: Insufficient documentation

## 2018-09-13 DIAGNOSIS — Z8673 Personal history of transient ischemic attack (TIA), and cerebral infarction without residual deficits: Secondary | ICD-10-CM | POA: Diagnosis not present

## 2018-09-13 DIAGNOSIS — R12 Heartburn: Secondary | ICD-10-CM

## 2018-09-13 DIAGNOSIS — Z79899 Other long term (current) drug therapy: Secondary | ICD-10-CM | POA: Insufficient documentation

## 2018-09-13 DIAGNOSIS — J439 Emphysema, unspecified: Secondary | ICD-10-CM | POA: Insufficient documentation

## 2018-09-13 DIAGNOSIS — Z7901 Long term (current) use of anticoagulants: Secondary | ICD-10-CM

## 2018-09-13 DIAGNOSIS — M25473 Effusion, unspecified ankle: Secondary | ICD-10-CM | POA: Insufficient documentation

## 2018-09-13 DIAGNOSIS — I712 Thoracic aortic aneurysm, without rupture: Secondary | ICD-10-CM | POA: Diagnosis not present

## 2018-09-13 DIAGNOSIS — R634 Abnormal weight loss: Secondary | ICD-10-CM

## 2018-09-13 DIAGNOSIS — I4891 Unspecified atrial fibrillation: Secondary | ICD-10-CM

## 2018-09-13 DIAGNOSIS — Z85828 Personal history of other malignant neoplasm of skin: Secondary | ICD-10-CM | POA: Insufficient documentation

## 2018-09-13 DIAGNOSIS — K5900 Constipation, unspecified: Secondary | ICD-10-CM | POA: Insufficient documentation

## 2018-09-13 DIAGNOSIS — M7989 Other specified soft tissue disorders: Secondary | ICD-10-CM

## 2018-09-13 DIAGNOSIS — C9000 Multiple myeloma not having achieved remission: Secondary | ICD-10-CM

## 2018-09-13 LAB — CBC WITH DIFFERENTIAL (CANCER CENTER ONLY)
Abs Immature Granulocytes: 0.09 10*3/uL — ABNORMAL HIGH (ref 0.00–0.07)
Basophils Absolute: 0.1 10*3/uL (ref 0.0–0.1)
Basophils Relative: 1 %
Eosinophils Absolute: 0.1 10*3/uL (ref 0.0–0.5)
Eosinophils Relative: 1 %
HCT: 35.2 % — ABNORMAL LOW (ref 39.0–52.0)
Hemoglobin: 11.5 g/dL — ABNORMAL LOW (ref 13.0–17.0)
Immature Granulocytes: 1 %
Lymphocytes Relative: 14 %
Lymphs Abs: 1.1 10*3/uL (ref 0.7–4.0)
MCH: 31.1 pg (ref 26.0–34.0)
MCHC: 32.7 g/dL (ref 30.0–36.0)
MCV: 95.1 fL (ref 80.0–100.0)
Monocytes Absolute: 0.8 10*3/uL (ref 0.1–1.0)
Monocytes Relative: 10 %
Neutro Abs: 6.1 10*3/uL (ref 1.7–7.7)
Neutrophils Relative %: 73 %
Platelet Count: 355 10*3/uL (ref 150–400)
RBC: 3.7 MIL/uL — ABNORMAL LOW (ref 4.22–5.81)
RDW: 15.9 % — ABNORMAL HIGH (ref 11.5–15.5)
WBC Count: 8.2 10*3/uL (ref 4.0–10.5)
nRBC: 0 % (ref 0.0–0.2)

## 2018-09-13 LAB — CMP (CANCER CENTER ONLY)
ALT: 27 U/L (ref 0–44)
AST: 70 U/L — ABNORMAL HIGH (ref 15–41)
Albumin: 4.3 g/dL (ref 3.5–5.0)
Alkaline Phosphatase: 189 U/L — ABNORMAL HIGH (ref 38–126)
Anion gap: 18 — ABNORMAL HIGH (ref 5–15)
BUN: 18 mg/dL (ref 8–23)
CO2: 24 mmol/L (ref 22–32)
Calcium: 9.5 mg/dL (ref 8.9–10.3)
Chloride: 96 mmol/L — ABNORMAL LOW (ref 98–111)
Creatinine: 0.76 mg/dL (ref 0.61–1.24)
GFR, Est AFR Am: >60 mL/min (ref >60)
GFR, Estimated: >60 mL/min (ref >60)
Glucose, Bld: 115 mg/dL — ABNORMAL HIGH (ref 70–99)
Potassium: 4.2 mmol/L (ref 3.5–5.1)
Sodium: 138 mmol/L (ref 135–145)
Total Bilirubin: 1.1 mg/dL (ref 0.3–1.2)
Total Protein: 7.3 g/dL (ref 6.5–8.1)

## 2018-09-13 LAB — TSH: TSH: 15.009 u[IU]/mL — ABNORMAL HIGH (ref 0.320–4.118)

## 2018-09-13 MED ORDER — ALUM & MAG HYDROXIDE-SIMETH 200-200-20 MG/5ML PO SUSP
ORAL | Status: AC
  Filled 2018-09-13: qty 30

## 2018-09-13 MED ORDER — SODIUM CHLORIDE 0.9 % IV SOLN
Freq: Once | INTRAVENOUS | Status: AC
  Administered 2018-09-13: 13:00:00 via INTRAVENOUS
  Filled 2018-09-13: qty 250

## 2018-09-13 MED ORDER — SODIUM CHLORIDE 0.9 % IV SOLN
1200.0000 mg | Freq: Once | INTRAVENOUS | Status: AC
  Administered 2018-09-13: 14:00:00 1200 mg via INTRAVENOUS
  Filled 2018-09-13: qty 20

## 2018-09-13 MED ORDER — ALUM & MAG HYDROXIDE-SIMETH 200-200-20 MG/5ML PO SUSP
15.0000 mL | Freq: Once | ORAL | Status: AC
  Administered 2018-09-13: 13:00:00 15 mL via ORAL

## 2018-09-13 MED ORDER — SODIUM CHLORIDE 0.9 % IV SOLN
14.2000 mg/kg | Freq: Once | INTRAVENOUS | Status: AC
  Administered 2018-09-13: 14:00:00 1000 mg via INTRAVENOUS
  Filled 2018-09-13: qty 32

## 2018-09-13 NOTE — Progress Notes (Signed)
[facility]   [phone] [phone]) [phone]   Clinic Follow up Note   Patient Care Team: Hulan Fess, MD as PCP - General (Family Medicine) Arna Snipe, RN as Oncology Nurse Navigator Evans Lance, MD as Consulting Physician (Cardiology) 09/13/2018  CHIEF COMPLAINT: f/u Antonio Manning  SUMMARY OF ONCOLOGIC HISTORY:   Hepatocellular carcinoma ([place])   07/03/2018 Imaging    CT AP W Contrast 07/03/18  IMPRESSION: Innumerable hepatic masses, predominantly involving the left hepatic lobe, including a dominant 8.9 cm mass in segment 4. This appearance is suspicious for metastatic disease versus multifocal HCC.  1.9 cm enhancing lesion in the posterior left upper kidney, suspicious for solid renal neoplasm such as renal cell carcinoma. Given the additional findings, this is of questionable clinical significance. Small volume abdominopelvic ascites.     07/28/2018 Initial Biopsy    Diagnosis 07/28/18 Liver, needle/core biopsy, Left lobe - HEPATOCELLULAR CARCINOMA. SEE COMMENT.    07/28/2018 Cancer Staging    Staging form: Liver, AJCC 8th Edition - Clinical stage from 07/28/2018: Stage IIIA (cT3, cN0, cM0) - Signed by [name], MD on 08/09/2018    08/04/2018 Initial Diagnosis    Hepatocellular carcinoma ([place])    08/13/2018 Imaging    CT Chest 08/13/18  IMPRESSION: 1. No evidence of thoracic metastatic disease. 2. Emphysema with right middle lobe bronchiectasis. 3. Ascending thoracic aortic aneurysm. Recommend semi-annual imaging followup by CTA or MRA and referral to cardiothoracic surgery if not already obtained. This recommendation follows 2010 ACCF/AHA/AATS/ACR/ASA/SCA/SCAI/SIR/STS/SVM Guidelines for the Diagnosis and Management of Patients With Thoracic Aortic Disease. Circulation. 2010; 121: Q119-E174 4. Multicentric hepatocellular carcinoma with involvement of the right lobe. ADDENDUM: incidental finding in the original report. There is a subcutaneous  lesion in the upper back measuring 2.0 cm and 2 HU on image 4/2, likely an incidental an epidermal inclusion (sebaceous) cyst.    08/21/2018 -  Chemotherapy    Tecentriq (Atezolizumab) and Avastin (bevacizumab) starting 08/23/18     CURRENT THERAPY:  Tecentriq (Atezolizumab)1200mg and Avastin (bevacizumab)15mg /kg every 3 weeksstarting 08/23/18. S/p cycle 1  INTERVAL HISTORY: Antonio Manning returns for follow up as scheduled and next treatment cycle. He completed cycle 1 avastin/tecentriq on 08/23/18. He spoke with Dr. Burr Medico a week after via telephone call. Today he feels "so-so." Appetite is fair. He feels he could drink more. Drinks one nutrition supplement daily. Has occasional heartburn. Has been prescribed carafate. Denies n/v/c/d. He has mild fatigue. He exercises indoors on stationary bike but lacks motivation to go outdoors much. DOE with exercise is mild, stable. Denies cough, chest pain. Has mild ankle swelling, left greater than right, for about 3 weeks. Denies calf pain. On Eliquis. Denies bleeding. Denies fever or chills.   REVIEW OF SYSTEMS:   Constitutional: Denies fevers, chills or abnormal weight loss (+) mild fatigue  Eyes: Denies blurriness of vision Ears, nose, mouth, throat, and face: Denies mucositis or sore throat. Denies epistaxis  Respiratory: Denies cough or wheezes (+) mild DOE Cardiovascular: Denies palpitation, chest discomfort (+) ankle swelling, L >R Gastrointestinal:  Denies nausea, vomiting, constipation, diarrhea, GI bleeding, or abdominal pain (+) heartburn  Skin: Denies abnormal skin rashes Lymphatics: Denies new lymphadenopathy or easy bruising Neurological:Denies numbness, tingling or new weaknesses Behavioral/Psych: Mood is stable, no new changes (+) lacks motivation to exercise  All other systems were reviewed with the patient and are negative.  MEDICAL HISTORY:  Past Medical History:  Diagnosis Date   Arthritis 05-13-11   osteoarthritis,lt. hip, and  neck area  Cancer ([place]) 2011-06-01   skin cancer lesions-multiple   Cataract Jun 01, 2011   right   Cholesterol serum elevated 06/01/11   tx. Lipitor   Expressive aphasia 10/2015   Hearing loss 2011/06/01   wears hearing aids bilaterally   Kidney calculi 06/01/11   past hx. x1-passed on own, mild prostate issues-no meds   PONV (postoperative nausea and vomiting) June 01, 2011   with cataract surgery   Stroke ([place]) 10/2015    SURGICAL HISTORY: Past Surgical History:  Procedure Laterality Date   BACK SURGERY  Jun 01, 2011   Lumbar disc surgery-no hardware   CATARACT EXTRACTION W/ INTRAOCULAR LENS IMPLANT  Jun 01, 2011   left eye   EP IMPLANTABLE DEVICE N/A 10/29/2015   Procedure: Loop Recorder Insertion;  Surgeon: Evans Lance, MD;  Location: [facility];  Service: Cardiovascular;  Laterality: N/A;   EYE SURGERY     eye lid lift    I&D EXTREMITY Right 01/17/2016   Procedure: IRRIGATION AND DEBRIDEMENT LEG LACERATION;  Surgeon: Vickey Huger, MD;  Location: [facility];  Service: Orthopedics;  Laterality: Right;   TEE WITHOUT CARDIOVERSION N/A 10/29/2015   Procedure: TRANSESOPHAGEAL ECHOCARDIOGRAM (TEE);  Surgeon: Larey Dresser, MD;  Location: [facility];  Service: Cardiovascular;  Laterality: N/A;   TOTAL HIP ARTHROPLASTY  05/20/2011   Procedure: TOTAL HIP ARTHROPLASTY ANTERIOR APPROACH;  Surgeon: Mauri Pole, MD;  Location: [facility];  Service: Orthopedics;  Laterality: Left;    I have reviewed the social history and family history with the patient and they are unchanged from previous note.  ALLERGIES:  is allergic to codeine.  MEDICATIONS:  Current Outpatient Medications  Medication Sig Dispense Refill   atorvastatin (LIPITOR) 40 MG tablet Take 40 mg by mouth daily.      Coenzyme Q10 (COQ10) 100 MG CAPS Take 100 mg by mouth daily.     ELIQUIS 5 MG TABS tablet TAKE 1 TABLET BY MOUTH TWICE A DAY 180 tablet 2   Multiple Vitamin (MULTIVITAMIN) tablet Take 1 tablet by mouth daily.       ondansetron (ZOFRAN) 8 MG tablet Take 1 tablet (8 mg total) by mouth 2 (two) times daily as needed (Nausea or vomiting). 30 tablet 1   vitamin C (ASCORBIC ACID) 250 MG tablet Take 250 mg by mouth daily.     No current facility-administered medications for this visit.    Facility-Administered Medications Ordered in Other Visits  Medication Dose Route Frequency Provider Last Rate Last Dose   atezolizumab (TECENTRIQ) 1,200 mg in sodium chloride 0.9 % 250 mL chemo infusion  1,200 mg Intravenous Once [name], MD       bevacizumab (AVASTIN) 1,000 mg in sodium chloride 0.9 % 100 mL chemo infusion  14.2 mg/kg (Treatment Plan Recorded) Intravenous Once [name], MD        PHYSICAL EXAMINATION: ECOG PERFORMANCE STATUS: 1 - Symptomatic but completely ambulatory  Vitals:   09/13/18 1144  BP: (!) 126/93  Pulse: 69  Resp: 18  Temp: 97.7 F (36.5 C)  SpO2: 94%   Filed Weights   09/13/18 1144  Weight: 149 lb (67.6 kg)    GENERAL:alert, no distress and comfortable SKIN: no rashes or significant lesions EYES:  sclera clear OROPHARYNX:no thrush or ulcers LYMPH:  no palpable cervical lymphadenopathy  LUNGS: clear to auscultation, normal breathing effort HEART: Afib. Mild pedal edema. LLE slightly larger than right ABDOMEN: abdomen soft, non-tender and normal bowel sounds. Hepatomegaly  NEURO: nonfocal  LABORATORY DATA:  I have reviewed the data  as listed CBC Latest Ref Rng & Units 09/13/2018 08/23/2018 07/28/2018  WBC 4.0 - 10.5 K/uL 8.2 6.8 7.0  Hemoglobin 13.0 - 17.0 g/dL 11.5(L) 10.6(L) 11.6(L)  Hematocrit 39.0 - 52.0 % 35.2(L) 32.7(L) 36.7(L)  Platelets 150 - 400 K/uL 355 245 292     CMP Latest Ref Rng & Units 09/13/2018 08/23/2018 01/17/2016  Glucose 70 - 99 mg/dL 115(H) 106(H) 91  BUN 8 - 23 mg/dL 18 23 24(H)  Creatinine 0.61 - 1.24 mg/dL 0.76 0.77 1.02  Sodium 135 - 145 mmol/L 138 142 143  Potassium 3.5 - 5.1 mmol/L 4.2 4.0 4.3  Chloride 98 - 111 mmol/L 96(L) 99 109  CO2  22 - 32 mmol/L 24 23 27   Calcium 8.9 - 10.3 mg/dL 9.5 9.5 8.8(L)  Total Protein 6.5 - 8.1 g/dL 7.3 7.2 -  Total Bilirubin 0.3 - 1.2 mg/dL 1.1 0.9 -  Alkaline Phos 38 - 126 U/L 189(H) 173(H) -  AST 15 - 41 U/L 70(H) 58(H) -  ALT 0 - 44 U/L 27 24 -      RADIOGRAPHIC STUDIES: I have personally reviewed the radiological images as listed and agreed with the findings in the report. No results found.   ASSESSMENT & PLAN: Antonio Manning is a 78 y.o. male with   1. Hepatocellular Carcinoma, cT3N0M0, stage IIIA -Diagnosed in 07/2018.No history or image evidence of liver cirrhosis -Discussed in our tumor board. Due tomultifocalbilobulardisease, large tumor burden, limited performance status and advanced age, he is not a candidate for surgical resection, liver transplant, or liver located therapy such as Y 90. -Due tolarge tumor burden, Dr. Burr Medico started started First-line TecentriqandAvastinq3weeks based on the IMbrave150 study result on 08/23/18.  2. Lower appetite,weight loss - secondary to underlying malignancy and treatment. Followed by dietician  3. Lower extremity swelling 4. Hypothyroid - baseline TSH 9, Dr. Rex Kras preferred to hold medication initially; TSH increased to 15 after cycle 1 3. Left Kidney lesion -Seen on 07/03/18 CT AP which is suspicions for kidney cancer.further work up on hold due to [facility]  4. AF - on eliquis, followed by cardiology  5. Diffuse Upper abdominal pain and constipation - secondary to malignancy 6. Fall with mild left hip discomfort  7.Goal of care discussion - treatment goal is palliative    Mr. Wagley appears stable. He completed cycle 1 tecentriq and avastin on 08/23/18. He tolerated moderately well with constipation, bruising, fatigue, and low appetite. He has recovered well. He denies abdominal pain. His main concerns today are lack of motivation for exercise, heartburn, and ankle swelling.   For lack of motivation: he does not appear depressed.  Likely related to thyroid and cancer treatment. I recommend he set short term, attainable exercise goals. I provided encouragement and validated his feelings.   For heartburn: He was prescribed carafate per Dr. Rex Kras, he took 1 or 2 doses. I recommend carafate slurry for heartburn. He does not like taste of Tums. He will get 1 dose maalox in clinic today. I recommend he sit upright after meals for 30 mins - 1 hr.   For pedal edema, LLE >RLE x3 weeks: He did fall on left side a few weeks ago in the lobby. Given he has afib, and underlying malignancy on avastin, he is at increased risk for DVT. He is compliant with Eliquis. Will obtain doppler study to r/o. In the meantime I recommend he elevate legs, and can wear compression stockings.   Weight is stable. Labs reviewed, CBC and CMP  stable. Baseline TSH 9.3, Dr. Rex Kras wanted to hold medication initially. Now up to 15 after cycle 1. Will fax report. AFP pending. VS noted, BP increased today, possibly related to avastin, but adequate for treatment. Will monitor closely and consider medication if BP continues to increase. His BP and edema may also be related to hypothyroidism.   PLAN: -Labs, VS reviewed - AFP pending  -TSH now 15, Fax to Dr. Rex Kras  -Proceed with cycle 2 tecentriq and avastin today -B/l doppler in next few days, ordered today -maalox today, continue carafate as prescribed by Dr. Rex Kras  -Lab, f/u, cycle 3 in 3 weeks   All questions were answered. The patient knows to call the clinic with any problems, questions or concerns. No barriers to learning was detected.    Antonio Feeling, NP 09/13/18

## 2018-09-13 NOTE — Patient Instructions (Signed)
[facility] Discharge Instructions for Patients Receiving Chemotherapy  Today you received the following chemotherapy agents Tecentriq; Avastin  To help prevent nausea and vomiting after your treatment, we encourage you to take your nausea medication as directed.  If you develop nausea and vomiting that is not controlled by your nausea medication, call the clinic.   BELOW ARE SYMPTOMS THAT SHOULD BE REPORTED IMMEDIATELY:  *FEVER GREATER THAN 100.5 F  *CHILLS WITH OR WITHOUT FEVER  NAUSEA AND VOMITING THAT IS NOT CONTROLLED WITH YOUR NAUSEA MEDICATION  *UNUSUAL SHORTNESS OF BREATH  *UNUSUAL BRUISING OR BLEEDING  TENDERNESS IN MOUTH AND THROAT WITH OR WITHOUT PRESENCE OF ULCERS  *URINARY PROBLEMS  *BOWEL PROBLEMS  UNUSUAL RASH Items with * indicate a potential emergency and should be followed up as soon as possible.  Feel free to call the clinic should you have any questions or concerns. The clinic phone number is [phone].  Please show the [facility] at check-in to the Emergency Department and triage nurse.

## 2018-09-13 NOTE — Progress Notes (Signed)
Per Regan Rakers B NP ok to tx with elevated BP.

## 2018-09-13 NOTE — Telephone Encounter (Signed)
Spoke with Antonio Manning and advised of doppler appointment for 9am tomorrow at the [facility] vascular lab. I explained that this test is not performed at [facility] and the hospital is taking safety precautions to reduce any exposure including not allowing anyone else other than the patient to come to appointments. Also gave the telephone numbers for [facility], [facility] and [facility]. Pt wife verbalized understanding

## 2018-09-13 NOTE — Telephone Encounter (Signed)
Scheduled appt per 5/13 los. °

## 2018-09-14 ENCOUNTER — Ambulatory Visit (HOSPITAL_COMMUNITY): Payer: Medicare Other

## 2018-09-14 LAB — AFP TUMOR MARKER

## 2018-09-18 ENCOUNTER — Other Ambulatory Visit: Payer: Self-pay

## 2018-09-18 ENCOUNTER — Telehealth: Payer: Self-pay

## 2018-09-18 ENCOUNTER — Ambulatory Visit (HOSPITAL_COMMUNITY)
Admission: RE | Admit: 2018-09-18 | Discharge: 2018-09-18 | Disposition: A | Discharge status: Home / Self Care | Payer: Medicare Other | Source: Ambulatory Visit | Attending: Nurse Practitioner | Admitting: Nurse Practitioner

## 2018-09-18 DIAGNOSIS — E039 Hypothyroidism, unspecified: Secondary | ICD-10-CM

## 2018-09-18 DIAGNOSIS — M7989 Other specified soft tissue disorders: Secondary | ICD-10-CM | POA: Diagnosis not present

## 2018-09-18 NOTE — Telephone Encounter (Signed)
Spoke with pt wife. Advised pr Cira Rue NP Doppler results were negative.  Wife verbalized understanding. Per wife pt PCP has prescribed levothyroxine and will pick medication up from pharmacy today.

## 2018-09-18 NOTE — Progress Notes (Signed)
VASCULAR LAB PRELIMINARY  PRELIMINARY  PRELIMINARY  PRELIMINARY  Bilateral lower extremity venous duplex completed.    Preliminary report:  See CV proc for preliminary results.  Called report to [place], RVT 09/18/2018, 11:56 AM

## 2018-09-18 NOTE — Progress Notes (Signed)
Dr. Eddie Dibbles office faxed [place] a request to draw a TSH in 6 weeks this has been ordered.

## 2018-09-19 ENCOUNTER — Inpatient Hospital Stay: Payer: Medicare Other

## 2018-09-19 NOTE — Progress Notes (Signed)
Nutrition Follow-up:  RD working remotely.  Patient with hepatocellular cancer.  Followed by Dr. Burr Medico.  Currently receiving tecentriq and avastin.     Spoke with wife, [name] via phone for nutrition follow-up.  Wife reports patient does not have hearing aide in and wants her to speak with RD.  Wife reports that patient's appetite has improved a little. She reports that patient is really trying to eat more.  Reports yesterday was able to eat gravy biscuit and coffee for breakfast. Lunch was most of 6inch roast beef sub with meat cheese and vegetables.  Mid afternoon had ensure plus with ice cream (milkshake) and supper was few bites of mashed potatoes and butter beans.  Before bed had another ensure milkshake.    Wife reports patient with taste changes but noticed this about 1 year ago after dental procedure.    Also reports that patient is riding stationary bike some (none in 2 days) for exercise.  Wife has encouraged him to use hand weights while watching TV    Medications: MVI, zofran, synthyroid  Labs: reviewed  Anthropometrics:   Weight noted on 5/13 149 lb decreased from 155 lb on 4/20 (last seen by RD)   NUTRITION DIAGNOSIS: Unintentional weight loss continues    INTERVENTION:  Encouraged continued use of ensure plus milkshakes 1-2 times per day Reviewed high calorie, high protein strategies Encouraged small frequent meals. Encouraged exercise.     MONITORING, EVALUATION, GOAL: Patient will tolerate increased calorie and protein to minimize weight loss   NEXT VISIT: phone f/u June 23rd (Tuesday)  Red Mandt B. Zenia Resides, [place], [place] Registered Dietitian [phone] (pager)

## 2018-09-20 ENCOUNTER — Telehealth: Payer: Self-pay | Admitting: Internal Medicine

## 2018-09-20 NOTE — Telephone Encounter (Signed)
New message:    Patient wife calling concering  Some new medication that her  Husband is now taking and would like for some one to call her.

## 2018-09-20 NOTE — Telephone Encounter (Signed)
Spoke to patient's wife . She states the patient is a patient of Dr Forde Dandy, not Dr C.  she had few question -   patient was just started on  Levothyroxine .25   she was reading  Insert  -  1- Whether may interact  With eliquis    ( since it is a blood thinner) 2- may have side effect  With irregular heratrate? 3 may cause s.Marland Kitchenob, swelling ?/ She states patient has this occasionally.  she would like Dr Sharee Pimple opinion if patient should take medication   Aware will defer to Dr Lovena Le - someone will contact her with answer

## 2018-09-22 NOTE — Telephone Encounter (Signed)
Call returned to Pt wife.  Notified if Pt PCP has identified that Pt needs levothyroxine that he must take it.  This is not a medication that you can think about taking or not taking.   Advised it is replacing chemicals that are naturally occurring in the patient's body.  This is not introducing anything new.  Pt wife asked if I had checked with Dr. Lovena Le.  I advised Pt wife that this was not a medication that needed to be checked with Dr. Lovena Le.  Gave example of aspirin.  This would be appropriate to discuss taking vs not taking.  Levothyroxine is replacement.  Treats hypothyroidism.  Advised to read about hypothyroidism and not treating it.  Wife seemed to understand.

## 2018-09-26 ENCOUNTER — Telehealth: Payer: Self-pay | Admitting: *Deleted

## 2018-09-26 NOTE — Telephone Encounter (Signed)
Received vm message from pt's son regarding his father's nausea meds. TCT [name] and spoke with him.  [name] states that his dad c/o nausea most everyday. He has Zofran ordered BID but is only taking it 1 x a day as someone had told him that zofran may cause a stroke. Pt has a h/o stroke and he refuses to take more than 1 zofran.  [name] also states his dad is staying in bed more, not getting up to even step outside.  Most of the nausea is in the morning though pt doesn't take his Zofran then.  Pt is also having issues with constipation. Advised [name] that this constipation can contribute to the nausea. Advised to start miralax daily to get his bowel movements more regular. Also advised eating smaller meals more often instead of trying to eat 3 larger meals.  We discussed other antiemetics but advised that medications such as compazine/phenergan will make him sleepy. [name] states he does not want him sleeping more during the day as it is a problem already.  [name] states he will give the zofran 1st thing in the morning with food and start the miralax to see if that helps, before trying another medication. Pt has an appt to see Dr. Burr Medico next week.

## 2018-09-27 ENCOUNTER — Ambulatory Visit (INDEPENDENT_AMBULATORY_CARE_PROVIDER_SITE_OTHER): Payer: Medicare Other | Admitting: *Deleted

## 2018-09-27 DIAGNOSIS — I480 Paroxysmal atrial fibrillation: Secondary | ICD-10-CM | POA: Diagnosis not present

## 2018-09-27 LAB — CUP PACEART REMOTE DEVICE CHECK
Date Time Interrogation Session: 20200527174041
Implantable Pulse Generator Implant Date: 20170628

## 2018-10-02 NOTE — Progress Notes (Signed)
[name]   [phone]) [phone] [phone]) [phone]   Clinic Follow up Note   Patient Care Team: Hulan Fess, MD as PCP - General (Family Medicine) Arna Snipe, RN as Oncology Nurse Navigator Evans Lance, MD as Consulting Physician (Cardiology)  Date of Service:  10/04/2018  CHIEF COMPLAINT: F/u of [place]   SUMMARY OF ONCOLOGIC HISTORY:   Hepatocellular carcinoma ([place])   07/03/2018 Imaging    CT AP W Contrast 07/03/18  IMPRESSION: Innumerable hepatic masses, predominantly involving the left hepatic lobe, including a dominant 8.9 cm mass in segment 4. This appearance is suspicious for metastatic disease versus multifocal HCC.  1.9 cm enhancing lesion in the posterior left upper kidney, suspicious for solid renal neoplasm such as renal cell carcinoma. Given the additional findings, this is of questionable clinical significance. Small volume abdominopelvic ascites.     07/28/2018 Initial Biopsy    Diagnosis 07/28/18 Liver, needle/core biopsy, Left lobe - HEPATOCELLULAR CARCINOMA. SEE COMMENT.    07/28/2018 Cancer Staging    Staging form: Liver, AJCC 8th Edition - Clinical stage from 07/28/2018: Stage IIIA (cT3, cN0, cM0) - Signed by [name], MD on 08/09/2018    08/04/2018 Initial Diagnosis    Hepatocellular carcinoma ([place])    08/13/2018 Imaging    CT Chest 08/13/18  IMPRESSION: 1. No evidence of thoracic metastatic disease. 2. Emphysema with right middle lobe bronchiectasis. 3. Ascending thoracic aortic aneurysm. Recommend semi-annual imaging followup by CTA or MRA and referral to cardiothoracic surgery if not already obtained. This recommendation follows 2010 ACCF/AHA/AATS/ACR/ASA/SCA/SCAI/SIR/STS/SVM Guidelines for the Diagnosis and Management of Patients With Thoracic Aortic Disease. Circulation. 2010; 121: R678-L381 4. Multicentric hepatocellular carcinoma with involvement of the right lobe. ADDENDUM: incidental finding in the original report.  There is a subcutaneous lesion in the upper back measuring 2.0 cm and 2 HU on image 4/2, likely an incidental an epidermal inclusion (sebaceous) cyst.    08/21/2018 -  Chemotherapy    Tecentriq (Atezolizumab) and Avastin (bevacizumab) starting 08/23/18      CURRENT THERAPY:  Tecentriq (Atezolizumab)1271mand Avastin (bevacizumab)134mkg every 3 weeksstarting 08/23/18  INTERVAL HISTORY:  Antonio Manning is here for a follow up and treatment. He presents to the clinic alone. He notes he has been feeling weak spells. He is not eating consistently, mostly when he can and on days like that he has been having weak spells more often. He notes today is the worse day he has had with feeling weak and no energy. Last weak he felt more normal. Yesterday he fell and hit his left knee and left hip. He feels he has last feeling or strength on left. He feels his family is annoying him given her reduction in independence.  He notes he drinks ensure and has been drinking adequate amount. He is overall eating less than before.  He notes he has had nausea as well. He has antiemetics as needed. I reviewed his medication list with him. He denies fever, chills or abdominal pain. He notes he has trouble sleeping. He tries not to take naps during the day. He also denies HA but not occasional speech problems. He feels ALDs are tiring more and require he sit down as needed.  He notes he has been on Synthroid since last visit. He notes having mild bleeding from hemorrhoids. This is now resolved.    REVIEW OF SYSTEMS:   Constitutional: Denies fevers, chills (+) Low energy, fatigue (+) Decreased appetite and food intake, mild weight loss Eyes: Denies blurriness of  vision Ears, nose, mouth, throat, and face: Denies mucositis or sore throat Respiratory: Denies cough, dyspnea or wheezes Cardiovascular: Denies palpitation, chest discomfort or lower extremity swelling Gastrointestinal:  Denies heartburn or change in bowel  habits (+) nausea  Skin: Denies abnormal skin rashes Lymphatics: Denies new lymphadenopathy or easy bruising Neurological:(+) Weakness, L>R Behavioral/Psych: Mood is stable, no new changes  All other systems were reviewed with the patient and are negative.  MEDICAL HISTORY:  Past Medical History:  Diagnosis Date  . Arthritis May 18, 2011   osteoarthritis,lt. hip, and neck area  . Cancer ([place]) 2011-05-18   skin cancer lesions-multiple  . Cataract 05/18/11   right  . Cholesterol serum elevated May 18, 2011   tx. Lipitor  . Expressive aphasia 10/2015  . Hearing loss 05-18-2011   wears hearing aids bilaterally  . Kidney calculi 05-18-2011   past hx. x1-passed on own, mild prostate issues-no meds  . PONV (postoperative nausea and vomiting) 05-18-2011   with cataract surgery  . Stroke [facility]) 10/2015    SURGICAL HISTORY: Past Surgical History:  Procedure Laterality Date  . BACK SURGERY  18-May-2011   Lumbar disc surgery-no hardware  . CATARACT EXTRACTION W/ INTRAOCULAR LENS IMPLANT  05/18/11   left eye  . EP IMPLANTABLE DEVICE N/A 10/29/2015   Procedure: Loop Recorder Insertion;  Surgeon: Evans Lance, MD;  Location: [facility];  Service: Cardiovascular;  Laterality: N/A;  . EYE SURGERY     eye lid lift   . I&D EXTREMITY Right 01/17/2016   Procedure: IRRIGATION AND DEBRIDEMENT LEG LACERATION;  Surgeon: Vickey Huger, MD;  Location: [facility];  Service: Orthopedics;  Laterality: Right;  . TEE WITHOUT CARDIOVERSION N/A 10/29/2015   Procedure: TRANSESOPHAGEAL ECHOCARDIOGRAM (TEE);  Surgeon: Larey Dresser, MD;  Location: [facility];  Service: Cardiovascular;  Laterality: N/A;  . TOTAL HIP ARTHROPLASTY  05/20/2011   Procedure: TOTAL HIP ARTHROPLASTY ANTERIOR APPROACH;  Surgeon: Mauri Pole, MD;  Location: [facility];  Service: Orthopedics;  Laterality: Left;    I have reviewed the social history and family history with the patient and they are unchanged from previous note.  ALLERGIES:  is allergic to  codeine.  MEDICATIONS:  Current Outpatient Medications  Medication Sig Dispense Refill  . atorvastatin (LIPITOR) 40 MG tablet Take 40 mg by mouth daily.     . Coenzyme Q10 (COQ10) 100 MG CAPS Take 100 mg by mouth daily.    Marland Kitchen ELIQUIS 5 MG TABS tablet TAKE 1 TABLET BY MOUTH TWICE A DAY 180 tablet 2  . levothyroxine (SYNTHROID) 25 MCG tablet Take 25 mcg by mouth daily before breakfast.    . Multiple Vitamin (MULTIVITAMIN) tablet Take 1 tablet by mouth daily.    . ondansetron (ZOFRAN) 8 MG tablet Take 1 tablet (8 mg total) by mouth 2 (two) times daily as needed (Nausea or vomiting). 30 tablet 1  . vitamin C (ASCORBIC ACID) 250 MG tablet Take 250 mg by mouth daily.    . fluticasone (FLONASE) 50 MCG/ACT nasal spray     . mirtazapine (REMERON) 7.5 MG tablet Take 1 tablet (7.5 mg total) by mouth at bedtime. 30 tablet 0  . pantoprazole (PROTONIX) 40 MG tablet Take 40 mg by mouth daily.     No current facility-administered medications for this visit.    Facility-Administered Medications Ordered in Other Visits  Medication Dose Route Frequency Provider Last Rate Last Dose  . 0.9 %  sodium chloride infusion   Intravenous Once [name], MD      .  bevacizumab (AVASTIN) 1,000 mg in sodium chloride 0.9 % 100 mL chemo infusion  1,000 mg Intravenous Once [name], MD 280 mL/hr at 10/04/18 1531 1,000 mg at 10/04/18 1531    PHYSICAL EXAMINATION: ECOG PERFORMANCE STATUS: 3 - Symptomatic, >50% confined to bed  Vitals:   10/04/18 1325  BP: (!) 114/92  Pulse: (!) 102  Resp: 17  Temp: 97.6 F (36.4 C)  SpO2: 96%   Filed Weights   10/04/18 1325  Weight: 146 lb 3.2 oz (66.3 kg)    GENERAL:alert, no distress and comfortable SKIN: skin color, texture, turgor are normal, no rashes or significant lesions EYES: normal, Conjunctiva are pink and non-injected, sclera clear NECK: supple, thyroid normal size, non-tender, without nodularity LYMPH:  no palpable lymphadenopathy in the cervical, axillary   LUNGS: clear to auscultation and percussion with normal breathing effort HEART: regular rate & rhythm and no murmurs and no lower extremity edema ABDOMEN:abdomen soft, non-tender and normal bowel sounds Musculoskeletal:no cyanosis of digits and no clubbing  NEURO: alert & oriented x 3 with fluent speech, no focal motor/sensory deficits except mildly decreased muscle strength on both legs, his gait and speech are normal   LABORATORY DATA:  I have reviewed the data as listed CBC Latest Ref Rng & Units 10/04/2018 09/13/2018 08/23/2018  WBC 4.0 - 10.5 K/uL 8.2 8.2 6.8  Hemoglobin 13.0 - 17.0 g/dL 11.6(L) 11.5(L) 10.6(L)  Hematocrit 39.0 - 52.0 % 35.6(L) 35.2(L) 32.7(L)  Platelets 150 - 400 K/uL 444(H) 355 245     CMP Latest Ref Rng & Units 10/04/2018 09/13/2018 08/23/2018  Glucose 70 - 99 mg/dL 96 115(H) 106(H)  BUN 8 - 23 mg/dL _0 Creatinine 0.61 - 1.24 mg/dL 0.67 0.76 0.77  Sodium 135 - 145 mmol/L 138 138 142  Potassium 3.5 - 5.1 mmol/L 4.2 4.2 4.0  Chloride 98 - 111 mmol/L 97(L) 96(L) 99  CO2 22 - 32 mmol/L _1 Calcium 8.9 - 10.3 mg/dL 9.0 9.5 9.5  Total Protein 6.5 - 8.1 g/dL 6.9 7.3 7.2  Total Bilirubin 0.3 - 1.2 mg/dL 1.0 1.1 0.9  Alkaline Phos 38 - 126 U/L 179(H) 189(H) 173(H)  AST 15 - 41 U/L 69(H) 70(H) 58(H)  ALT 0 - 44 U/L _2 RADIOGRAPHIC STUDIES: I have personally reviewed the radiological images as listed and agreed with the findings in the report. No results found.   ASSESSMENT & PLAN:  Antonio Manning is a 78 y.o. male with   1. Hepatocellular Carcinoma, cT3N0M0, stage IIIA -He was recently diagnosed in 07/2018. -His CT AP from 07/03/18 shows multifocal large tumor in his live, mainly in left lobe. Biopsy from 07/28/18 showed [place]. There is also a 1.9 lesion seen on left kidney suspicions for kidney cancer.No evidence of distant metastasison stage CT scans. -No history or image evidence of liver cirrhosis -His case was previously discussed in  our tumor board. Due tomultifocalbilobulardisease, large tumor burden, limited performance status and advanced age, he is not a candidate for surgical resection, liver transplant, or liver located therapy such as Y 90. -Due tolarge tumor burden, he started First-line TecentriqandAvastinq3weeks based on theIMbrave150studyresult on 08/23/18.  -he has been tolerating treatment well, but reports intermittent episodes of fatigue, especially lately, he had some difficulty to get up from chair by himself. He also reports decreased appetite  -I encouraged him to rotate his body position when sitting or laying for long periods of time, to prevent  bed sore.  -Labs reviewed, CBC and CMP WNL except Hg 11.4, PLT 444K, AST 69, Alk Phos 179. TSH and AFP still pending. Overall adequate to proceed with Tecentriq and Avastin today. I will give IV Fluids today.  -His AFP is very high, has not dropped since he start treatment, clinically more fatigued with low appetite, concerning for disease progression  -f/u in 2 weeks with a repeated CT abd/pel w contrast a few days before, will stop or change his treatment if he has disease progression    2. Lower appetite,weight loss  -He has lost 8-10 pounds in a 46monthspan.  -Secondary to nausea, taste changeand underlying malignancy -I instructed him to use anti-emetics 30 minutes before each meal.  -He has been eating smaller meals but not consistently. He has been abel to keep fruits down.  -I will call in Mirtazapine today (10/04/18) -He will f/u with dietician.   3. Left Kidney lesion  -Seen on 07/03/18 CT AP which is suspicions for kidney cancer.We discussed that kidney cancer is usually slow growing.Further workupand treatment will be held at this time due to his incurableHCC. Will monitor closely for now.  4. AF -On Eliquis, follow-up withcardiology  5. Diffuse Upper abdominal pain and constipation  -Secondary to malignancy, tolerable and  mainly with laying down. He has not been taking pain meds  -He had Hepatomegaly on 08/23/18 exam.  -I previously encouraged him to use laxative, and Tylenol as needed with no more than 3 tablets a day. -He currently denies abdominal pain  6. Fall with mild left hip discomfort  -He tripped and fell on left hip and knee in [place] on 08/23/18. He had a left hip replacement 5 years ago.  -He is able to walk with mild discomfort.  -I encouraged him to keep his body physically active, but to not over due it.  -I previously suggested he use a cane when ambulating outside of house.  7.Goal of care discussion  -We again discussed the incurable nature of his cancer, and the overall poor prognosis, especially if he does not have good response to therapy  -The patient understands the goal of care is palliative.  8. General body weakness  -Upper extremity stronger than lower. He has had stroke in the past.  -He has developed hypothyroidism from [place] and recently started synthroid. May need adjusting dose as this can cause fatigue and weakness.  -He fell again on left side and feels this is weaker.  -There was equal strength on exam today, No high suspicion for acute stroke  -I encouraged him to watch his weakness and speech. If he develops concerning signs, I recommend he go to ED.   PLAN: -Labs reviewed and adequate to proceed with cycle 3Tecentriq and Avastin.  -f/u in 2 weeks with CT abd/pel w contrast a few days before -Lab, f/u and Tecentriq and Avastin in 3 weeks  -f/u TSH, may need increase his synthroid dose  -with pt's permission, I called his son AQuita Skyeback and left a message to update his condition.    No problem-specific Assessment & Plan notes found for this encounter.   Orders Placed This Encounter  Procedures  . CT Abdomen Pelvis W Contrast    Standing Status:   Future    Standing Expiration Date:   10/04/2019    Order Specific Question:   If indicated for the ordered  procedure, I authorize the administration of contrast media per Radiology protocol    Answer:  Yes    Order Specific Question:   Preferred imaging location?    Answer:   [facility]    Order Specific Question:   Is Oral Contrast requested for this exam?    Answer:   Yes, Per Radiology protocol    Order Specific Question:   Radiology Contrast Protocol - do NOT remove file path    Answer:   [email or web address]   All questions were answered. The patient knows to call the clinic with any problems, questions or concerns. No barriers to learning was detected. I spent 25 minutes counseling the patient face to face. The total time spent in the appointment was 30 minutes and more than 50% was on counseling and review of test results     [name], MD 10/04/2018   I, Joslyn Devon, am acting as scribe for [name], MD.   I have reviewed the above documentation for accuracy and completeness, and I agree with the above.

## 2018-10-04 ENCOUNTER — Encounter: Payer: Self-pay | Admitting: Hematology

## 2018-10-04 ENCOUNTER — Inpatient Hospital Stay: Payer: Medicare Other | Attending: Hematology

## 2018-10-04 ENCOUNTER — Other Ambulatory Visit: Payer: Self-pay

## 2018-10-04 ENCOUNTER — Other Ambulatory Visit: Payer: Self-pay | Admitting: Hematology

## 2018-10-04 ENCOUNTER — Inpatient Hospital Stay: Payer: Medicare Other

## 2018-10-04 ENCOUNTER — Inpatient Hospital Stay (HOSPITAL_BASED_OUTPATIENT_CLINIC_OR_DEPARTMENT_OTHER): Payer: Medicare Other | Admitting: Hematology

## 2018-10-04 VITALS — BP 130/94 | HR 73

## 2018-10-04 VITALS — BP 114/92 | HR 102 | Temp 97.6°F | Resp 17 | Ht 73.0 in | Wt 146.2 lb

## 2018-10-04 DIAGNOSIS — Z85828 Personal history of other malignant neoplasm of skin: Secondary | ICD-10-CM | POA: Diagnosis not present

## 2018-10-04 DIAGNOSIS — Z5111 Encounter for antineoplastic chemotherapy: Secondary | ICD-10-CM | POA: Insufficient documentation

## 2018-10-04 DIAGNOSIS — R188 Other ascites: Secondary | ICD-10-CM

## 2018-10-04 DIAGNOSIS — Z87442 Personal history of urinary calculi: Secondary | ICD-10-CM | POA: Diagnosis not present

## 2018-10-04 DIAGNOSIS — K5900 Constipation, unspecified: Secondary | ICD-10-CM | POA: Insufficient documentation

## 2018-10-04 DIAGNOSIS — N2889 Other specified disorders of kidney and ureter: Secondary | ICD-10-CM | POA: Diagnosis not present

## 2018-10-04 DIAGNOSIS — Z7901 Long term (current) use of anticoagulants: Secondary | ICD-10-CM | POA: Diagnosis not present

## 2018-10-04 DIAGNOSIS — E039 Hypothyroidism, unspecified: Secondary | ICD-10-CM | POA: Insufficient documentation

## 2018-10-04 DIAGNOSIS — J439 Emphysema, unspecified: Secondary | ICD-10-CM

## 2018-10-04 DIAGNOSIS — I712 Thoracic aortic aneurysm, without rupture: Secondary | ICD-10-CM | POA: Insufficient documentation

## 2018-10-04 DIAGNOSIS — R630 Anorexia: Secondary | ICD-10-CM

## 2018-10-04 DIAGNOSIS — J479 Bronchiectasis, uncomplicated: Secondary | ICD-10-CM | POA: Diagnosis not present

## 2018-10-04 DIAGNOSIS — R531 Weakness: Secondary | ICD-10-CM | POA: Diagnosis not present

## 2018-10-04 DIAGNOSIS — Z8673 Personal history of transient ischemic attack (TIA), and cerebral infarction without residual deficits: Secondary | ICD-10-CM

## 2018-10-04 DIAGNOSIS — C220 Liver cell carcinoma: Secondary | ICD-10-CM | POA: Diagnosis not present

## 2018-10-04 DIAGNOSIS — I4892 Unspecified atrial flutter: Secondary | ICD-10-CM

## 2018-10-04 DIAGNOSIS — R634 Abnormal weight loss: Secondary | ICD-10-CM | POA: Insufficient documentation

## 2018-10-04 DIAGNOSIS — M1990 Unspecified osteoarthritis, unspecified site: Secondary | ICD-10-CM | POA: Insufficient documentation

## 2018-10-04 DIAGNOSIS — Z5112 Encounter for antineoplastic immunotherapy: Secondary | ICD-10-CM | POA: Diagnosis present

## 2018-10-04 DIAGNOSIS — Z79899 Other long term (current) drug therapy: Secondary | ICD-10-CM | POA: Insufficient documentation

## 2018-10-04 LAB — CMP (CANCER CENTER ONLY)
ALT: 19 U/L (ref 0–44)
AST: 69 U/L — ABNORMAL HIGH (ref 15–41)
Albumin: 4.1 g/dL (ref 3.5–5.0)
Alkaline Phosphatase: 179 U/L — ABNORMAL HIGH (ref 38–126)
Anion gap: 18 — ABNORMAL HIGH (ref 5–15)
BUN: 17 mg/dL (ref 8–23)
CO2: 23 mmol/L (ref 22–32)
Calcium: 9 mg/dL (ref 8.9–10.3)
Chloride: 97 mmol/L — ABNORMAL LOW (ref 98–111)
Creatinine: 0.67 mg/dL (ref 0.61–1.24)
GFR, Est AFR Am: >60 mL/min (ref >60)
GFR, Estimated: >60 mL/min (ref >60)
Glucose, Bld: 96 mg/dL (ref 70–99)
Potassium: 4.2 mmol/L (ref 3.5–5.1)
Sodium: 138 mmol/L (ref 135–145)
Total Bilirubin: 1 mg/dL (ref 0.3–1.2)
Total Protein: 6.9 g/dL (ref 6.5–8.1)

## 2018-10-04 LAB — CBC WITH DIFFERENTIAL (CANCER CENTER ONLY)
Abs Immature Granulocytes: 0.12 10*3/uL — ABNORMAL HIGH (ref 0.00–0.07)
Basophils Absolute: 0.1 10*3/uL (ref 0.0–0.1)
Basophils Relative: 1 %
Eosinophils Absolute: 0.1 10*3/uL (ref 0.0–0.5)
Eosinophils Relative: 1 %
HCT: 35.6 % — ABNORMAL LOW (ref 39.0–52.0)
Hemoglobin: 11.6 g/dL — ABNORMAL LOW (ref 13.0–17.0)
Immature Granulocytes: 2 %
Lymphocytes Relative: 14 %
Lymphs Abs: 1.1 10*3/uL (ref 0.7–4.0)
MCH: 30.4 pg (ref 26.0–34.0)
MCHC: 32.6 g/dL (ref 30.0–36.0)
MCV: 93.4 fL (ref 80.0–100.0)
Monocytes Absolute: 0.9 10*3/uL (ref 0.1–1.0)
Monocytes Relative: 11 %
Neutro Abs: 6 10*3/uL (ref 1.7–7.7)
Neutrophils Relative %: 71 %
Platelet Count: 444 10*3/uL — ABNORMAL HIGH (ref 150–400)
RBC: 3.81 MIL/uL — ABNORMAL LOW (ref 4.22–5.81)
RDW: 15.6 % — ABNORMAL HIGH (ref 11.5–15.5)
WBC Count: 8.2 10*3/uL (ref 4.0–10.5)
nRBC: 0 % (ref 0.0–0.2)

## 2018-10-04 LAB — TSH: TSH: 8.376 u[IU]/mL — ABNORMAL HIGH (ref 0.320–4.118)

## 2018-10-04 MED ORDER — SODIUM CHLORIDE 0.9 % IV SOLN
INTRAVENOUS | Status: AC
  Administered 2018-10-04: 14:00:00 via INTRAVENOUS
  Filled 2018-10-04 (×2): qty 250

## 2018-10-04 MED ORDER — MIRTAZAPINE 7.5 MG PO TABS: 7.5000 mg | ORAL_TABLET | Freq: Every day | ORAL | 0 refills | Status: DC

## 2018-10-04 MED ORDER — SODIUM CHLORIDE 0.9 % IV SOLN
1000.0000 mg | Freq: Once | INTRAVENOUS | Status: AC
  Administered 2018-10-04: 1000 mg via INTRAVENOUS
  Filled 2018-10-04: qty 32

## 2018-10-04 MED ORDER — SODIUM CHLORIDE 0.9 % IV SOLN
Freq: Once | INTRAVENOUS | Status: DC
  Filled 2018-10-04: qty 250

## 2018-10-04 MED ORDER — SODIUM CHLORIDE 0.9 % IV SOLN
1200.0000 mg | Freq: Once | INTRAVENOUS | Status: AC
  Administered 2018-10-04: 1200 mg via INTRAVENOUS
  Filled 2018-10-04: qty 20

## 2018-10-04 MED ORDER — PROCHLORPERAZINE MALEATE 10 MG PO TABS: 10.0000 mg | ORAL_TABLET | Freq: Four times a day (QID) | ORAL | 0 refills | Status: DC | PRN

## 2018-10-04 NOTE — Telephone Encounter (Signed)
Dr Burr Medico responded to this message.

## 2018-10-04 NOTE — Progress Notes (Signed)
Per Dr Burr Medico ok to tx today is adding hydration NS to tx plan.

## 2018-10-04 NOTE — Patient Instructions (Signed)
[facility] Discharge Instructions for Patients Receiving Chemotherapy  Today you received the following chemotherapy agents Tecentriq; Avastin  To help prevent nausea and vomiting after your treatment, we encourage you to take your nausea medication as directed.  If you develop nausea and vomiting that is not controlled by your nausea medication, call the clinic.   BELOW ARE SYMPTOMS THAT SHOULD BE REPORTED IMMEDIATELY:  *FEVER GREATER THAN 100.5 F  *CHILLS WITH OR WITHOUT FEVER  NAUSEA AND VOMITING THAT IS NOT CONTROLLED WITH YOUR NAUSEA MEDICATION  *UNUSUAL SHORTNESS OF BREATH  *UNUSUAL BRUISING OR BLEEDING  TENDERNESS IN MOUTH AND THROAT WITH OR WITHOUT PRESENCE OF ULCERS  *URINARY PROBLEMS  *BOWEL PROBLEMS  UNUSUAL RASH Items with * indicate a potential emergency and should be followed up as soon as possible.  Feel free to call the clinic should you have any questions or concerns. The clinic phone number is [phone].  Please show the [place] at check-in to the Emergency Department and triage nurse.

## 2018-10-05 ENCOUNTER — Telehealth: Payer: Self-pay | Admitting: Hematology

## 2018-10-05 ENCOUNTER — Telehealth: Payer: Self-pay

## 2018-10-05 NOTE — Telephone Encounter (Signed)
Spoke with patient's wife regarding lab results.  Per Dr. Burr Medico. Thyroid function is still low, which contributes to his severe fatigue.  I have faxed the results to Dr. Rex Kras so he can adjust his thyroid medication.  She verbalized an understanding.

## 2018-10-05 NOTE — Telephone Encounter (Signed)
Scheduled appt per 6/3 los.  Left a voice message of appt date and time.

## 2018-10-05 NOTE — Telephone Encounter (Signed)
-----   Message from [name], MD sent at 10/05/2018  7:57 AM EDT ----- Please let pt know that his thyroid function is still low, which contributes to his severe fatigue, and please fax the result to his PCP Dr. Norton Pastel for him to adjust his synthroid dose, thanks  [name] 10/05/2018

## 2018-10-06 LAB — AFP TUMOR MARKER

## 2018-10-06 NOTE — Progress Notes (Signed)
[facility] Summary Report / Loop Recorder 

## 2018-10-09 ENCOUNTER — Telehealth: Payer: Self-pay | Admitting: *Deleted

## 2018-10-09 ENCOUNTER — Telehealth: Payer: Self-pay | Admitting: Hematology

## 2018-10-09 NOTE — Telephone Encounter (Signed)
Received call from pt's wife to review and confirm upcoming appts.Reviewed.  Wife was able to repeat back the dates and times of appts. She is requesting phone appt on 10/19/18 to review scan results.

## 2018-10-10 ENCOUNTER — Inpatient Hospital Stay: Payer: Medicare Other

## 2018-10-10 ENCOUNTER — Telehealth: Payer: Self-pay | Admitting: Hematology

## 2018-10-10 NOTE — Progress Notes (Signed)
Nutrition Follow-up:  RD working remotely.  Patient with hepatocellular cancer.  Followed by Dr. Burr Medico. Currently receiving tecentriq and avastin.    Spoke with wife via phone.  She reports that the 4 days after chemotherapy he had really good days, ate good, felt good.  Reports over the last few days nausea is back and not eating as much.  Was drinking 2 ensure with ice cream per day now drinking 1/2 to 1.  Wife reports complains of nausea.  Also has taste change but this was noticed after dental procedure about 1 year ago.    Wife reports so far today patient has had gravy biscuit and coffee for breakfast and noon was 2 eggs with sausage and bisuit and watermelon and 1/2 ensure with ice cream.  Wife also reports couple of bites of cheesecake.  Reports that fruit is tasting good time him right now.    Medications: remeron added  Labs: reviewed  Anthropometrics:   Weight is 146 lb 3.2 oz on 6/3 decreased from 149 lb on 5/13.    NUTRITION DIAGNOSIS: Unintentional weight loss continues   INTERVENTION:  Reviewed ways to help with taste change.   Encouraged continued use of supplements for added calories and protein Encouraged taking nausea medications proactively to help prevent nausea. Wife with questions regarding remeron and side effects.  Encouraged her to address those with MD.       MONITORING, EVALUATION, GOAL: Patient will tolerate increased calories and protein to minimize weight loss.   NEXT VISIT: phone f/u June 23rd  Yunus Stoklosa B. Zenia Resides, [place], [place] Registered Dietitian [phone] (pager)

## 2018-10-10 NOTE — Telephone Encounter (Signed)
Rescheduled lab appt so it is closing to his CT appt per the patient spouse request.

## 2018-10-16 ENCOUNTER — Inpatient Hospital Stay: Payer: Medicare Other

## 2018-10-16 ENCOUNTER — Ambulatory Visit (HOSPITAL_COMMUNITY)
Admission: RE | Admit: 2018-10-16 | Discharge: 2018-10-16 | Disposition: A | Discharge status: Home / Self Care | Payer: Medicare Other | Source: Ambulatory Visit | Attending: Hematology | Admitting: Hematology

## 2018-10-16 ENCOUNTER — Other Ambulatory Visit: Payer: Self-pay

## 2018-10-16 DIAGNOSIS — I700 Atherosclerosis of aorta: Secondary | ICD-10-CM | POA: Diagnosis not present

## 2018-10-16 DIAGNOSIS — I77811 Abdominal aortic ectasia: Secondary | ICD-10-CM | POA: Diagnosis not present

## 2018-10-16 DIAGNOSIS — R188 Other ascites: Secondary | ICD-10-CM | POA: Insufficient documentation

## 2018-10-16 DIAGNOSIS — E039 Hypothyroidism, unspecified: Secondary | ICD-10-CM | POA: Diagnosis not present

## 2018-10-16 DIAGNOSIS — C220 Liver cell carcinoma: Secondary | ICD-10-CM | POA: Insufficient documentation

## 2018-10-16 DIAGNOSIS — R590 Localized enlarged lymph nodes: Secondary | ICD-10-CM | POA: Diagnosis not present

## 2018-10-16 LAB — CBC WITH DIFFERENTIAL (CANCER CENTER ONLY)
Abs Immature Granulocytes: 0.08 10*3/uL — ABNORMAL HIGH (ref 0.00–0.07)
Basophils Absolute: 0 10*3/uL (ref 0.0–0.1)
Basophils Relative: 1 %
Eosinophils Absolute: 0 10*3/uL (ref 0.0–0.5)
Eosinophils Relative: 0 %
HCT: 37.6 % — ABNORMAL LOW (ref 39.0–52.0)
Hemoglobin: 11.9 g/dL — ABNORMAL LOW (ref 13.0–17.0)
Immature Granulocytes: 1 %
Lymphocytes Relative: 15 %
Lymphs Abs: 1.3 10*3/uL (ref 0.7–4.0)
MCH: 29.8 pg (ref 26.0–34.0)
MCHC: 31.6 g/dL (ref 30.0–36.0)
MCV: 94 fL (ref 80.0–100.0)
Monocytes Absolute: 0.9 10*3/uL (ref 0.1–1.0)
Monocytes Relative: 11 %
Neutro Abs: 6.2 10*3/uL (ref 1.7–7.7)
Neutrophils Relative %: 72 %
Platelet Count: 408 10*3/uL — ABNORMAL HIGH (ref 150–400)
RBC: 4 MIL/uL — ABNORMAL LOW (ref 4.22–5.81)
RDW: 15.7 % — ABNORMAL HIGH (ref 11.5–15.5)
WBC Count: 8.5 10*3/uL (ref 4.0–10.5)
nRBC: 0 % (ref 0.0–0.2)

## 2018-10-16 LAB — CMP (CANCER CENTER ONLY)
ALT: 19 U/L (ref 0–44)
AST: 66 U/L — ABNORMAL HIGH (ref 15–41)
Albumin: 4.1 g/dL (ref 3.5–5.0)
Alkaline Phosphatase: 184 U/L — ABNORMAL HIGH (ref 38–126)
Anion gap: 19 — ABNORMAL HIGH (ref 5–15)
BUN: 11 mg/dL (ref 8–23)
CO2: 23 mmol/L (ref 22–32)
Calcium: 9.2 mg/dL (ref 8.9–10.3)
Chloride: 97 mmol/L — ABNORMAL LOW (ref 98–111)
Creatinine: 0.73 mg/dL (ref 0.61–1.24)
GFR, Est AFR Am: >60 mL/min
GFR, Estimated: >60 mL/min
Glucose, Bld: 95 mg/dL (ref 70–99)
Potassium: 4 mmol/L (ref 3.5–5.1)
Sodium: 139 mmol/L (ref 135–145)
Total Bilirubin: 1.1 mg/dL (ref 0.3–1.2)
Total Protein: 7.3 g/dL (ref 6.5–8.1)

## 2018-10-16 LAB — TSH: TSH: 12.134 u[IU]/mL — ABNORMAL HIGH (ref 0.320–4.118)

## 2018-10-16 MED ORDER — IOHEXOL 300 MG/ML  SOLN
100.0000 mL | Freq: Once | INTRAMUSCULAR | Status: AC | PRN
  Administered 2018-10-16: 100 mL via INTRAVENOUS

## 2018-10-16 MED ORDER — SODIUM CHLORIDE (PF) 0.9 % IJ SOLN
INTRAMUSCULAR | Status: AC
  Filled 2018-10-16: qty 50

## 2018-10-16 NOTE — Progress Notes (Signed)
[facility]   [phone] [phone]   Clinic Follow up Note   Patient Care Team: Hulan Fess, MD as PCP - General (Family Medicine) Arna Snipe, RN as Oncology Nurse Navigator Evans Lance, MD as Consulting Physician (Cardiology)   I connected with Early Chars Carstens on 10/19/2018 at  8:45 AM EDT by telephone visit and verified that I am speaking with the correct person using two identifiers.  I discussed the limitations, risks, security and privacy concerns of performing an evaluation and management service by telephone and the availability of in person appointments. I also discussed with the patient that there may be a patient responsible charge related to this service. The patient expressed understanding and agreed to proceed.   Other persons participating in the visit and their role in the encounter:  His wife   Patient's location:  His home  Provider's location:  My Office   CHIEF COMPLAINT:  F/u of [place]   SUMMARY OF ONCOLOGIC HISTORY: Oncology History  Hepatocellular carcinoma ([place])  07/03/2018 Imaging   CT AP W Contrast 07/03/18  IMPRESSION: Innumerable hepatic masses, predominantly involving the left hepatic lobe, including a dominant 8.9 cm mass in segment 4. This appearance is suspicious for metastatic disease versus multifocal HCC.  1.9 cm enhancing lesion in the posterior left upper kidney, suspicious for solid renal neoplasm such as renal cell carcinoma. Given the additional findings, this is of questionable clinical significance. Small volume abdominopelvic ascites.    07/28/2018 Initial Biopsy   Diagnosis 07/28/18 Liver, needle/core biopsy, Left lobe - HEPATOCELLULAR CARCINOMA. SEE COMMENT.   07/28/2018 Cancer Staging   Staging form: Liver, AJCC 8th Edition - Clinical stage from 07/28/2018: Stage IIIA (cT3, cN0, cM0) - Signed by [name], MD on 08/09/2018   08/04/2018 Initial Diagnosis   Hepatocellular carcinoma ([place])    08/13/2018 Imaging   CT Chest 08/13/18  IMPRESSION: 1. No evidence of thoracic metastatic disease. 2. Emphysema with right middle lobe bronchiectasis. 3. Ascending thoracic aortic aneurysm. Recommend semi-annual imaging followup by CTA or MRA and referral to cardiothoracic surgery if not already obtained. This recommendation follows 2010 ACCF/AHA/AATS/ACR/ASA/SCA/SCAI/SIR/STS/SVM Guidelines for the Diagnosis and Management of Patients With Thoracic Aortic Disease. Circulation. 2010; 121: G921-J941 4. Multicentric hepatocellular carcinoma with involvement of the right lobe. ADDENDUM: incidental finding in the original report. There is a subcutaneous lesion in the upper back measuring 2.0 cm and 2 HU on image 4/2, likely an incidental an epidermal inclusion (sebaceous) cyst.   08/23/2018 -  Chemotherapy   Tecentriq (Atezolizumab) and Avastin (bevacizumab) starting 08/23/18   10/16/2018 Imaging   CT AP W Contast  IMPRESSION: 1. Extensive tumor burden identified within both lobes of liver compatible with multifocal hepatoma. When compared with previous exam index lesions demonstrate mild increase in size from previous study. 2. Increase in volume of ascites. 3. Aortocaval  lymph node adenopathy increased in the interval. 4. Increase in volume of right pleural effusion. 5.  Aortic Atherosclerosis (ICD10-I70.0). 6. Ectatic abdominal aorta at risk for aneurysm development. Recommend followup by ultrasound in 5 years. This recommendation follows ACR consensus guidelines: White Paper of the ACR Incidental Findings Committee II on Vascular Findings. J Am Coll Radiol 2013; 10:789-794.      CURRENT THERAPY:  Tecentriq (Atezolizumab)1200mg and Avastin (bevacizumab)15mg /kg every 3 weeksstarting 08/23/18  INTERVAL HISTORY:  Jorgen Wolfinger Suess is here for a follow up. He was able to identify himself by birth date. He notes he is doing well. He notes he is taking  synthroid. He notes his fatigue  is still the same. He notes no pain. His wife feels his appetite is not adequate or at baseline. He notes he tries to take Ensure. He does note SOB with light activity.    REVIEW OF SYSTEMS:   Constitutional: Denies fevers, chills or abnormal weight loss (+) fatigue (+) lower appetite  Eyes: Denies blurriness of vision Ears, nose, mouth, throat, and face: Denies mucositis or sore throat Respiratory: Denies cough or wheezes (+) SOB with light activity  Cardiovascular: Denies palpitation, chest discomfort or lower extremity swelling Gastrointestinal:  Denies nausea, heartburn or change in bowel habits Skin: Denies abnormal skin rashes Lymphatics: Denies new lymphadenopathy or easy bruising Neurological:Denies numbness, tingling or new weaknesses Behavioral/Psych: Mood is stable, no new changes  All other systems were reviewed with the patient and are negative.  MEDICAL HISTORY:  Past Medical History:  Diagnosis Date  . Arthritis 05/20/2011   osteoarthritis,lt. hip, and neck area  . Cancer ([place]) 20-May-2011   skin cancer lesions-multiple  . Cataract 05-20-2011   right  . Cholesterol serum elevated May 20, 2011   tx. Lipitor  . Expressive aphasia 10/2015  . Hearing loss 05-20-2011   wears hearing aids bilaterally  . Kidney calculi 05-20-11   past hx. x1-passed on own, mild prostate issues-no meds  . PONV (postoperative nausea and vomiting) 05-20-2011   with cataract surgery  . Stroke [facility]) 10/2015    SURGICAL HISTORY: Past Surgical History:  Procedure Laterality Date  . BACK SURGERY  May 20, 2011   Lumbar disc surgery-no hardware  . CATARACT EXTRACTION W/ INTRAOCULAR LENS IMPLANT  May 20, 2011   left eye  . EP IMPLANTABLE DEVICE N/A 10/29/2015   Procedure: Loop Recorder Insertion;  Surgeon: Evans Lance, MD;  Location: [facility];  Service: Cardiovascular;  Laterality: N/A;  . EYE SURGERY     eye lid lift   . I&D EXTREMITY Right 01/17/2016   Procedure: IRRIGATION AND DEBRIDEMENT LEG LACERATION;   Surgeon: Vickey Huger, MD;  Location: [facility];  Service: Orthopedics;  Laterality: Right;  . TEE WITHOUT CARDIOVERSION N/A 10/29/2015   Procedure: TRANSESOPHAGEAL ECHOCARDIOGRAM (TEE);  Surgeon: Larey Dresser, MD;  Location: [facility];  Service: Cardiovascular;  Laterality: N/A;  . TOTAL HIP ARTHROPLASTY  05/20/2011   Procedure: TOTAL HIP ARTHROPLASTY ANTERIOR APPROACH;  Surgeon: Mauri Pole, MD;  Location: [facility];  Service: Orthopedics;  Laterality: Left;    I have reviewed the social history and family history with the patient and they are unchanged from previous note.  ALLERGIES:  is allergic to codeine.  MEDICATIONS:  Current Outpatient Medications  Medication Sig Dispense Refill  . atorvastatin (LIPITOR) 40 MG tablet Take 40 mg by mouth daily.     . Coenzyme Q10 (COQ10) 100 MG CAPS Take 100 mg by mouth daily.    Marland Kitchen ELIQUIS 5 MG TABS tablet TAKE 1 TABLET BY MOUTH TWICE A DAY 180 tablet 2  . fluticasone (FLONASE) 50 MCG/ACT nasal spray     . levothyroxine (SYNTHROID) 25 MCG tablet Take 25 mcg by mouth daily before breakfast.    . mirtazapine (REMERON) 7.5 MG tablet Take 1 tablet (7.5 mg total) by mouth at bedtime. 30 tablet 0  . Multiple Vitamin (MULTIVITAMIN) tablet Take 1 tablet by mouth daily.    . ondansetron (ZOFRAN) 8 MG tablet Take 1 tablet (8 mg total) by mouth 2 (two) times daily as needed (Nausea or vomiting). 30 tablet 1  . pantoprazole (PROTONIX) 40 MG tablet Take  40 mg by mouth daily.    . prochlorperazine (COMPAZINE) 10 MG tablet Take 1 tablet (10 mg total) by mouth every 6 (six) hours as needed for nausea or vomiting. 30 tablet 0  . vitamin C (ASCORBIC ACID) 250 MG tablet Take 250 mg by mouth daily.     No current facility-administered medications for this visit.     PHYSICAL EXAMINATION: ECOG PERFORMANCE STATUS: 2 - Symptomatic, <50% confined to bed  No vitals taken today, Exam not performed today  LABORATORY DATA:  I have reviewed the data as listed CBC  Latest Ref Rng & Units 10/16/2018 10/04/2018 09/13/2018  WBC 4.0 - 10.5 K/uL 8.5 8.2 8.2  Hemoglobin 13.0 - 17.0 g/dL 11.9(L) 11.6(L) 11.5(L)  Hematocrit 39.0 - 52.0 % 37.6(L) 35.6(L) 35.2(L)  Platelets 150 - 400 K/uL 408(H) 444(H) 355     CMP Latest Ref Rng & Units 10/16/2018 10/04/2018 09/13/2018  Glucose 70 - 99 mg/dL 95 96 115(H)  BUN 8 - 23 mg/dL 11 17 18   Creatinine 0.61 - 1.24 mg/dL 0.73 0.67 0.76  Sodium 135 - 145 mmol/L 139 138 138  Potassium 3.5 - 5.1 mmol/L 4.0 4.2 4.2  Chloride 98 - 111 mmol/L 97(L) 97(L) 96(L)  CO2 22 - 32 mmol/L 23 23 24   Calcium 8.9 - 10.3 mg/dL 9.2 9.0 9.5  Total Protein 6.5 - 8.1 g/dL 7.3 6.9 7.3  Total Bilirubin 0.3 - 1.2 mg/dL 1.1 1.0 1.1  Alkaline Phos 38 - 126 U/L 184(H) 179(H) 189(H)  AST 15 - 41 U/L 66(H) 69(H) 70(H)  ALT 0 - 44 U/L 19 19 27       RADIOGRAPHIC STUDIES: I have personally reviewed the radiological images as listed and agreed with the findings in the report. No results found.   ASSESSMENT & PLAN:  Antonio Manning is a 78 y.o. male with   1. Hepatocellular Carcinoma, cT3N0M0, stage IIIA -He was recently diagnosed in 07/2018. -His CT AP from 07/03/18 shows multifocal large tumor in his live, mainly in left lobe. Biopsy from 07/28/18 showed [place]. There is also a 1.9 lesion seen on left kidney suspicions for kidney cancer.No evidence of distant metastasison stage CT scans. -No history or image evidence of liver cirrhosis -His case waspreviouslydiscussed in our tumor board. Due tomultifocalbilobulardisease, large tumor burden, limited performance status and advanced age, he is not a candidate for surgical resection, liver transplant, or liver located therapy such as Y 90. -Due tolarge tumor burden,he started First-lineTecentriqandAvastinq3weeks based on theIMbrave150studyresulton 08/23/18, he has been tolerating well. -he developed hypothyroidism from treatment and his latest TSH at 12.134 which can contribute to his  fatigue. I encouraged him to contact Dr Rex Kras about increase synthroid dose. He can increase from 4mcg to 74mcg if he cannot reach Dr Rex Kras.  -We discussed his CT AP from 10/16/18 which shows overall stable disease with mild disease progression of 10-15% increase in liver lesions size. We discussed the small possibility of pseudo progression on immunotherapy. He is clinically stable overall, I recommend him to continue for 2-3 months before another scan to determine true response.  -He is clinically doing well and adequate enough to function at home and take care of himself. He is willing to continue current treatment.  -If he does progress on next scan, he does have the option of oral TKI such as lenvatinib Levantinib. Will discuss further after next scan.  -F/u next week with NP Lacie and treatment.    2. Lower appetite,weight loss  -He has lost  8-10 pounds ina 38monthspan. -Secondary to nausea, taste changeand underlying malignancy -I instructed him to use anti-emetics 30 minutes before each meal.  -He has been eating smaller meals but not consistently. He has been able to keep fruits down.  -I previously prescribed him Mirtazapine. He finds he has to eat smaller more frequent meals. He continues to try Ensure.  -He will f/u with dietician.   3. Left Kidney lesion  -Seen on 07/03/18 CT AP which is suspicions for kidney cancer.We discussed that kidney cancer is usually slow growing.Further workupand treatment will be held at this time due to his incurableHCC. Will monitor closely for now.  4. AF -On Eliquis, follow-up withcardiology  5. Diffuse Upper abdominal painand constipation -Secondary to malignancy, tolerable and mainly with laying down.He has not been taking pain meds -He hadHepatomegalyon 08/23/18 exam. -I previously encouraged him to use laxative, and Tylenol as needed with no more than 3 tablets a day. -He currently denies abdominal pain, overall improved    6. Fall with mild left hip discomfort  -[place] He had a left hip replacement 5 years ago.  -He is able to walk with mild discomfort.  -I encouraged him to keep his body physically active, but to not over due it.  -Ipreviouslysuggestedhe use a cane when ambulating outside of house.  7.Goal of care discussion  -We again discussed the incurable nature of his cancer, and the overall poor prognosis, especially if he does not have good response to therapy  -The patient understands the goal of care is palliative.  8. General body weakness  -Upper extremity stronger than lower. He has had stroke in the past.  -He has developed hypothyroidism from [place], and recently started synthroid. May need adjusting dose as this can cause fatigue and weakness.  -He fell again on left side and feels this is weaker.  -There was equal strength on exam today, No high suspicion for acute stroke  -I encouraged him to watch his weakness and speech. If he develops concerning signs, I recommend he go to ED.   9. Pleural Effusion and abdominal ascites  -There is small pleural effusion and abdominal ascites seen on CT scan as well. It is undetermined if the cause is from cancer.  -He does have occasional SOB with light activity.  -Will watch and do chest Xray if his dyspnea gets worse   PLAN: -CT AP reviewed, overall stable disease with mild disease progression, will continue current therapy  -Lab, f/u with Lacie and chemo next week    No problem-specific Assessment & Plan notes found for this encounter.   No orders of the defined types were placed in this encounter.  I discussed the assessment and treatment plan with the patient. The patient was provided an opportunity to ask questions and all were answered. The patient agreed with the plan and demonstrated an understanding of the instructions.  The patient was advised to call back or seek an  in-person evaluation if the symptoms worsen or if the condition fails to improve as anticipated.  I provided 22 minutes of non face-to-face telephone visit time during this encounter, and > 50% was spent counseling as documented under my assessment & plan.    [name], MD 10/19/2018   I, Joslyn Devon, am acting as scribe for [name], MD.   I have reviewed the above documentation for accuracy and completeness, and I agree with the above.

## 2018-10-17 ENCOUNTER — Telehealth: Payer: Self-pay | Admitting: *Deleted

## 2018-10-17 NOTE — Telephone Encounter (Signed)
Spoke with scheduler at Dr. Hulan Fess, PCP office.  Faxed TSH results to Dr. Eddie Dibbles office with notes to advise pt on thyroid medication.  Scheduler stated she would inform Dr. Theodis Shove of lab results. Dr. Hulan Fess    Phone     [phone]     ;     Fax      [phone]

## 2018-10-17 NOTE — Telephone Encounter (Signed)
-----   Message from [name], MD sent at 10/17/2018 10:41 AM EDT ----- Please fax his TSH results (including previous TSH) to his PCP, to see if pt needs to adjust his thyroid medicine, I will tall pt tomorrow during his appointment,  thanks   [name]  10/17/2018

## 2018-10-19 ENCOUNTER — Inpatient Hospital Stay (HOSPITAL_BASED_OUTPATIENT_CLINIC_OR_DEPARTMENT_OTHER): Payer: Medicare Other | Admitting: Hematology

## 2018-10-19 ENCOUNTER — Encounter: Payer: Self-pay | Admitting: Hematology

## 2018-10-19 DIAGNOSIS — Z9221 Personal history of antineoplastic chemotherapy: Secondary | ICD-10-CM | POA: Diagnosis not present

## 2018-10-19 DIAGNOSIS — Z8673 Personal history of transient ischemic attack (TIA), and cerebral infarction without residual deficits: Secondary | ICD-10-CM

## 2018-10-19 DIAGNOSIS — C220 Liver cell carcinoma: Secondary | ICD-10-CM

## 2018-10-20 ENCOUNTER — Telehealth: Payer: Self-pay | Admitting: Hematology

## 2018-10-20 NOTE — Telephone Encounter (Signed)
No los per 6/18. °

## 2018-10-24 ENCOUNTER — Inpatient Hospital Stay: Payer: Medicare Other

## 2018-10-24 NOTE — Progress Notes (Signed)
[place]   [phone]) [phone] [phone]) [phone]   Clinic Follow up Note   Patient Care Team: Antonio Fess, MD as PCP - General (Family Medicine) Antonio Snipe, RN as Oncology Nurse Navigator Antonio Lance, MD as Consulting Physician (Cardiology) 10/25/2018  CHIEF COMPLAINT: F/u Antonio Manning  SUMMARY OF ONCOLOGIC HISTORY: Oncology History  Hepatocellular carcinoma ([place])  07/03/2018 Imaging   CT AP W Contrast 07/03/18  IMPRESSION: Innumerable hepatic masses, predominantly involving the left hepatic lobe, including a dominant 8.9 cm mass in segment 4. This appearance is suspicious for metastatic disease versus multifocal HCC.  1.9 cm enhancing lesion in the posterior left upper kidney, suspicious for solid renal neoplasm such as renal cell carcinoma. Given the additional findings, this is of questionable clinical significance. Small volume abdominopelvic ascites.    07/28/2018 Initial Biopsy   Diagnosis 07/28/18 Liver, needle/core biopsy, Left lobe - HEPATOCELLULAR CARCINOMA. SEE COMMENT.   07/28/2018 Cancer Staging   Staging form: Liver, AJCC 8th Edition - Clinical stage from 07/28/2018: Stage IIIA (cT3, cN0, cM0) - Signed by Antonio Merle, MD on 08/09/2018   08/04/2018 Initial Diagnosis   Hepatocellular carcinoma ([place])   08/13/2018 Imaging   CT Chest 08/13/18  IMPRESSION: 1. No evidence of thoracic metastatic disease. 2. Emphysema with right middle lobe bronchiectasis. 3. Ascending thoracic aortic aneurysm. Recommend semi-annual imaging followup by CTA or MRA and referral to cardiothoracic surgery if not already obtained. This recommendation follows 2010 ACCF/AHA/AATS/ACR/ASA/SCA/SCAI/SIR/STS/SVM Guidelines for the Diagnosis and Management of Patients With Thoracic Aortic Disease. Circulation. 2010; 121: V779-T903 4. Multicentric hepatocellular carcinoma with involvement of the right lobe. ADDENDUM: incidental finding in the original report. There is a  subcutaneous lesion in the upper back measuring 2.0 cm and 2 HU on image 4/2, likely an incidental an epidermal inclusion (sebaceous) cyst.   08/23/2018 -  Chemotherapy   Tecentriq (Atezolizumab) and Avastin (bevacizumab) starting 08/23/18   10/16/2018 Imaging   CT AP W Contast  IMPRESSION: 1. Extensive tumor burden identified within both lobes of liver compatible with multifocal hepatoma. When compared with previous exam index lesions demonstrate mild increase in size from previous study. 2. Increase in volume of ascites. 3. Aortocaval  lymph node adenopathy increased in the interval. 4. Increase in volume of right pleural effusion. 5.  Aortic Atherosclerosis (ICD10-I70.0). 6. Ectatic abdominal aorta at risk for aneurysm development. Recommend followup by ultrasound in 5 years. This recommendation follows ACR consensus guidelines: White Paper of the ACR Incidental Findings Committee II on Vascular Findings. J Am Coll Radiol 2013; 10:789-794.     CURRENT THERAPY: Tecentriq (Atezolizumab)1200mg and Avastin (bevacizumab)15mg /kg every 3 weeksstarting 08/23/18  INTERVAL HISTORY: Antonio Manning returns for f/u and treatment as scheduled. He completed cycle 3 atezolizumab and bevacizumab on 10/04/18. He had phone visit with Dr. Burr Manning on 10/19/18 to review restaging CT. He lost his balance upon getting up out of bed few days ago. He hit his left shoulder and arm. Did not hit his head. Did not lose consciousness. For past 3 days he has felt better in general. Appetite is good. Normal BM. Denies bleeding. Denies n/v. Has mild DOE which he attributes to lack of exercise. He notes breathing improves the more active he is. Denies dyspnea at rest. Denies cough, chest pain. Denies fever or chills. Has intermittent feet swelling for last month or so. Resolves with elevation. Denies calf pain.    MEDICAL HISTORY:  Past Medical History:  Diagnosis Date  . Arthritis 05-13-11   osteoarthritis,lt. hip, and neck  area  . Cancer ([place]) 05/25/11   skin cancer lesions-multiple  . Cataract 05-25-11   right  . Cholesterol serum elevated May 25, 2011   tx. Lipitor  . Expressive aphasia 10/2015  . Hearing loss 25-May-2011   wears hearing aids bilaterally  . Kidney calculi 05-25-11   past hx. x1-passed on own, mild prostate issues-no meds  . PONV (postoperative nausea and vomiting) 2011-05-25   with cataract surgery  . Stroke [facility]) 10/2015    SURGICAL HISTORY: Past Surgical History:  Procedure Laterality Date  . BACK SURGERY  05-25-11   Lumbar disc surgery-no hardware  . CATARACT EXTRACTION W/ INTRAOCULAR LENS IMPLANT  05-25-11   left eye  . EP IMPLANTABLE DEVICE N/A 10/29/2015   Procedure: Loop Recorder Insertion;  Surgeon: Antonio Lance, MD;  Location: [place] [facility];  Service: Cardiovascular;  Laterality: N/A;  . EYE SURGERY     eye lid lift   . I&D EXTREMITY Right 01/17/2016   Procedure: IRRIGATION AND DEBRIDEMENT LEG LACERATION;  Surgeon: Antonio Huger, MD;  Location: [place];  Service: Orthopedics;  Laterality: Right;  . TEE WITHOUT CARDIOVERSION N/A 10/29/2015   Procedure: TRANSESOPHAGEAL ECHOCARDIOGRAM (TEE);  Surgeon: Antonio Dresser, MD;  Location: [place];  Service: Cardiovascular;  Laterality: N/A;  . TOTAL HIP ARTHROPLASTY  05/20/2011   Procedure: TOTAL HIP ARTHROPLASTY ANTERIOR APPROACH;  Surgeon: Antonio Pole, MD;  Location: [facility];  Service: Orthopedics;  Laterality: Left;    I have reviewed the social history and family history with the patient and they are unchanged from previous note.  ALLERGIES:  is allergic to codeine.  MEDICATIONS:  Current Outpatient Medications  Medication Sig Dispense Refill  . atorvastatin (LIPITOR) 40 MG tablet Take 40 mg by mouth daily.     . Coenzyme Q10 (COQ10) 100 MG CAPS Take 100 mg by mouth daily.    Marland Kitchen ELIQUIS 5 MG TABS tablet TAKE 1 TABLET BY MOUTH TWICE A DAY 180 tablet 2  . fluticasone (FLONASE) 50 MCG/ACT nasal spray     . levothyroxine (SYNTHROID)  25 MCG tablet Take 25 mcg by mouth daily before breakfast.    . mirtazapine (REMERON) 7.5 MG tablet Take 1 tablet (7.5 mg total) by mouth at bedtime. 30 tablet 0  . ondansetron (ZOFRAN) 8 MG tablet Take 1 tablet (8 mg total) by mouth 2 (two) times daily as needed (Nausea or vomiting). 30 tablet 1  . pantoprazole (PROTONIX) 40 MG tablet Take 40 mg by mouth daily.    . prochlorperazine (COMPAZINE) 10 MG tablet Take 1 tablet (10 mg total) by mouth every 6 (six) hours as needed for nausea or vomiting. 30 tablet 0  . vitamin C (ASCORBIC ACID) 250 MG tablet Take 250 mg by mouth daily.    Marland Kitchen amLODipine (NORVASC) 2.5 MG tablet Take 1 tablet (2.5 mg total) by mouth daily. 30 tablet 1  . Multiple Vitamin (MULTIVITAMIN) tablet Take 1 tablet by mouth daily.     No current facility-administered medications for this visit.     PHYSICAL EXAMINATION: ECOG PERFORMANCE STATUS: 2 - Symptomatic, <50% confined to bed  Vitals:   10/25/18 1111 10/25/18 1112  BP: (!) 138/98 (!) 120/96  Pulse:    Resp:    Temp:    SpO2:     Filed Weights   10/25/18 1009  Weight: 148 lb 3.2 oz (67.2 kg)    GENERAL:alert, no distress and comfortable SKIN: generalized bruising. 2 cm skin tear to LUE  EYES: sclera clear LUNGS:  respirations even and unlabored  HEART: mild pedal edema. No calf tenderness  Musculoskeletal:no cyanosis of digits  NEURO: alert & oriented x 3 with fluent speech, presents in wheelchair  Limited exam for covid19 outbreak  LABORATORY DATA:  I have reviewed the data as listed CBC Latest Ref Rng & Units 10/25/2018 10/16/2018 10/04/2018  WBC 4.0 - 10.5 K/uL 7.1 8.5 8.2  Hemoglobin 13.0 - 17.0 g/dL 10.7(L) 11.9(L) 11.6(L)  Hematocrit 39.0 - 52.0 % 33.3(L) 37.6(L) 35.6(L)  Platelets 150 - 400 K/uL 361 408(H) 444(H)     CMP Latest Ref Rng & Units 10/25/2018 10/16/2018 10/04/2018  Glucose 70 - 99 mg/dL 85 95 96  BUN 8 - 23 mg/dL 14 11 17   Creatinine 0.61 - 1.24 mg/dL 0.64 0.73 0.67  Sodium 135 - 145  mmol/L 141 139 138  Potassium 3.5 - 5.1 mmol/L 3.9 4.0 4.2  Chloride 98 - 111 mmol/L 99 97(L) 97(L)  CO2 22 - 32 mmol/L 24 23 23   Calcium 8.9 - 10.3 mg/dL 9.1 9.2 9.0  Total Protein 6.5 - 8.1 g/dL 6.8 7.3 6.9  Total Bilirubin 0.3 - 1.2 mg/dL 0.9 1.1 1.0  Alkaline Phos 38 - 126 U/L 154(H) 184(H) 179(H)  AST 15 - 41 U/L 43(H) 66(H) 69(H)  ALT 0 - 44 U/L 14 19 19       RADIOGRAPHIC STUDIES: I have personally reviewed the radiological images as listed and agreed with the findings in the report. No results found.   ASSESSMENT & PLAN: Antonio Mckamie Pageis a 78 y.o.malewith   1. Hepatocellular Carcinoma, cT3N0M0, stage IIIA -Diagnosed in 07/2018.No history or image evidence of liver cirrhosis -Discussed in our tumor board. Due tomultifocalbilobulardisease, large tumor burden, limited performance status and advanced age, he is not a candidate for surgical resection, liver transplant, or liver located therapy such as Y 90. -Due tolarge tumor burden,Dr. Burr Manning started started First-lineTecentriqandAvastinq3weeks based on the IMbrave150 study resulton 08/23/18. -s/p 3 cycles  -Dr. Burr Manning previously reviewed restaging CT on 10/16/18 that shows mild 10-15% increase iin liver lesions. There is small possibility of pseudoprogression on immunotherapy. She recommended to continue current treatment and restage in 2-3 months  2. Lower appetite,weight loss - secondary to underlying malignancy and treatment. Followed by dietician  3. Lower extremity swelling 4. Hypothyroid - baseline TSH 9, Dr. Rex Kras preferred to hold medication initially; TSH increased after cycle 1 3. Left Kidney lesion -Seen on 07/03/18 CT AP which is suspicions for kidney cancer.further work up on hold due to [facility]  4. AF - on eliquis, followed by cardiology  5. Diffuse Upper abdominal painand constipation - secondary to malignancy 6. Fall with mild left hip discomfort  7.Goal of care discussion - treatment goal is palliative    Antonio Manning appears stable. He completed 3 cycles atezolizumab and bevacizumab q3 weeks. He has tolerated moderately well. He underwent restaging CT that showed mild 10-15% increase in liver lesion size. Dr. Burr Manning previously discussed this as well as possibility of pseudoprogression on immunotherapy. MD recommended to continue current plan and restage in 2-3 months. Labs reviewed, adequate for treatment. TSH is increasing, will fax to Dr. Rex Kras for synthroid adjustment. AFP pending.  His BP is trending up, likely due to bevacizumab. He has not been hypertensive prior, not on medication. He will start amlodipine 2.5 mg daily. He has mild pedal edema today, will watch closely on amlodipine. We reviewed side effects, he will hold if he is dizzy. He does have mild orthostatic BP changes today. I  encouraged him to remain adequately hydrated. BP remained elevated to 149/96 today after atezolizumab; given his elevated BP will hold bevacizumab today. I reviewed the plan with Dr. Burr Manning.   Given his recent fall, I strongly encouraged him to use cane at home, change position slowly, and have good nutrition. He is on eliquis. He is at high risk for recurrent fall. Monitor closely. I plan to check in via phone visit next week. F/u in 3 weeks with next cycle   PLAN: -Proceed with cycle 4 today, atezolizumab only  -Hold bevacizumab for HTN -Begin amlodipine 2.5 mg daily  -encouraged patient to eat and drink well -phone visit next week  -f/u and next treatment in 3 weeks  -I reviewed the plan with Dr. Burr Manning and discussed with infusion RN  -TSH increased, will fax to Dr. Rex Kras for synthroid adjustment   All questions were answered. The patient knows to call the clinic with any problems, questions or concerns. No barriers to learning was detected. I spent 20 minutes counseling the patient face to face. The total time spent in the appointment was 25 minutes and more than 50% was on counseling and review of test results      Alla Feeling, NP 10/25/18

## 2018-10-24 NOTE — Progress Notes (Signed)
Nutrition Follow-up:   RD working remotely.  Patient with hepatocellular cancer.  Followed by Dr. Burr Medico.  Currently receiving chemotherapy.   Spoke with wife for nutrition follow-up.  Wife reports that patient is doing well. Reports that he has eaten more in the last week and half than he has before finding out he had cancer.  Reports that he did have another fall this am and will discuss this with [facility] tomorrow.  Patient has been out in the yard today doing some pruning.  Son came from [place] today.  Reports that he has tried avocado and goat cheese today and like it.  Wife reports that he is looking better.      Medications: reviewed  Labs: reviewed  Anthropometrics:   No new weight since 6/3.       NUTRITION DIAGNOSIS: Unintentional weight loss continues   INTERVENTION:  Encouraged wife to continue provide high calorie, protein foods. Patient and wife will discuss fall with MD tomorrow.   Contact information provided    MONITORING, EVALUATION, GOAL: Patient will tolerate increased calories and protein to minimized weight loss   NEXT VISIT: phone f/u July 14  Aamir Mclinden B. Zenia Resides, [place], [place] Registered Dietitian [phone] (pager)

## 2018-10-25 ENCOUNTER — Encounter: Payer: Self-pay | Admitting: Nurse Practitioner

## 2018-10-25 ENCOUNTER — Inpatient Hospital Stay (HOSPITAL_BASED_OUTPATIENT_CLINIC_OR_DEPARTMENT_OTHER): Payer: Medicare Other | Admitting: Nurse Practitioner

## 2018-10-25 ENCOUNTER — Other Ambulatory Visit: Payer: Self-pay

## 2018-10-25 ENCOUNTER — Inpatient Hospital Stay: Payer: Medicare Other

## 2018-10-25 VITALS — BP 149/96

## 2018-10-25 VITALS — BP 120/96 | HR 86 | Temp 98.7°F | Resp 17 | Ht 73.0 in | Wt 148.2 lb

## 2018-10-25 DIAGNOSIS — R634 Abnormal weight loss: Secondary | ICD-10-CM | POA: Diagnosis not present

## 2018-10-25 DIAGNOSIS — R188 Other ascites: Secondary | ICD-10-CM | POA: Diagnosis not present

## 2018-10-25 DIAGNOSIS — C220 Liver cell carcinoma: Secondary | ICD-10-CM

## 2018-10-25 DIAGNOSIS — E039 Hypothyroidism, unspecified: Secondary | ICD-10-CM

## 2018-10-25 DIAGNOSIS — Z5111 Encounter for antineoplastic chemotherapy: Secondary | ICD-10-CM | POA: Diagnosis not present

## 2018-10-25 DIAGNOSIS — N2889 Other specified disorders of kidney and ureter: Secondary | ICD-10-CM | POA: Diagnosis not present

## 2018-10-25 DIAGNOSIS — R630 Anorexia: Secondary | ICD-10-CM | POA: Diagnosis not present

## 2018-10-25 LAB — CBC WITH DIFFERENTIAL (CANCER CENTER ONLY)
Abs Immature Granulocytes: 0.09 10*3/uL — ABNORMAL HIGH (ref 0.00–0.07)
Basophils Absolute: 0 10*3/uL (ref 0.0–0.1)
Basophils Relative: 1 %
Eosinophils Absolute: 0 10*3/uL (ref 0.0–0.5)
Eosinophils Relative: 1 %
HCT: 33.3 % — ABNORMAL LOW (ref 39.0–52.0)
Hemoglobin: 10.7 g/dL — ABNORMAL LOW (ref 13.0–17.0)
Immature Granulocytes: 1 %
Lymphocytes Relative: 14 %
Lymphs Abs: 1 10*3/uL (ref 0.7–4.0)
MCH: 29.9 pg (ref 26.0–34.0)
MCHC: 32.1 g/dL (ref 30.0–36.0)
MCV: 93 fL (ref 80.0–100.0)
Monocytes Absolute: 0.7 10*3/uL (ref 0.1–1.0)
Monocytes Relative: 9 %
Neutro Abs: 5.3 10*3/uL (ref 1.7–7.7)
Neutrophils Relative %: 74 %
Platelet Count: 361 10*3/uL (ref 150–400)
RBC: 3.58 MIL/uL — ABNORMAL LOW (ref 4.22–5.81)
RDW: 15.9 % — ABNORMAL HIGH (ref 11.5–15.5)
WBC Count: 7.1 10*3/uL (ref 4.0–10.5)
nRBC: 0 % (ref 0.0–0.2)

## 2018-10-25 LAB — CMP (CANCER CENTER ONLY)
ALT: 14 U/L (ref 0–44)
AST: 43 U/L — ABNORMAL HIGH (ref 15–41)
Albumin: 4.1 g/dL (ref 3.5–5.0)
Alkaline Phosphatase: 154 U/L — ABNORMAL HIGH (ref 38–126)
Anion gap: 18 — ABNORMAL HIGH (ref 5–15)
BUN: 14 mg/dL (ref 8–23)
CO2: 24 mmol/L (ref 22–32)
Calcium: 9.1 mg/dL (ref 8.9–10.3)
Chloride: 99 mmol/L (ref 98–111)
Creatinine: 0.64 mg/dL (ref 0.61–1.24)
GFR, Est AFR Am: >60 mL/min (ref >60)
GFR, Estimated: >60 mL/min (ref >60)
Glucose, Bld: 85 mg/dL (ref 70–99)
Potassium: 3.9 mmol/L (ref 3.5–5.1)
Sodium: 141 mmol/L (ref 135–145)
Total Bilirubin: 0.9 mg/dL (ref 0.3–1.2)
Total Protein: 6.8 g/dL (ref 6.5–8.1)

## 2018-10-25 LAB — TOTAL PROTEIN, URINE DIPSTICK: Protein, ur: <30 mg/dL — AB

## 2018-10-25 LAB — TSH: TSH: 16.947 u[IU]/mL — ABNORMAL HIGH (ref 0.320–4.118)

## 2018-10-25 MED ORDER — SODIUM CHLORIDE 0.9 % IV SOLN
Freq: Once | INTRAVENOUS | Status: AC
  Administered 2018-10-25: 12:00:00 via INTRAVENOUS
  Filled 2018-10-25: qty 250

## 2018-10-25 MED ORDER — AMLODIPINE BESYLATE 2.5 MG PO TABS: 2.5000 mg | ORAL_TABLET | Freq: Every day | ORAL | 1 refills | Status: DC

## 2018-10-25 MED ORDER — SODIUM CHLORIDE 0.9 % IV SOLN
1200.0000 mg | Freq: Once | INTRAVENOUS | Status: AC
  Administered 2018-10-25: 1200 mg via INTRAVENOUS
  Filled 2018-10-25: qty 20

## 2018-10-25 NOTE — Progress Notes (Signed)
No Avastin today per Cira Rue, NP

## 2018-10-25 NOTE — Progress Notes (Signed)
Per Cira Rue, NP, ok to proceed with treatment, starting with tecentriq first while we wait for urine protein and BP to improve.  Will continue to follow.

## 2018-10-25 NOTE — Patient Instructions (Signed)
[facility] Discharge Instructions for Patients Receiving Chemotherapy  Today you received the following chemotherapy agents Tecentriq; Avastin  To help prevent nausea and vomiting after your treatment, we encourage you to take your nausea medication as directed.  If you develop nausea and vomiting that is not controlled by your nausea medication, call the clinic.   BELOW ARE SYMPTOMS THAT SHOULD BE REPORTED IMMEDIATELY:  *FEVER GREATER THAN 100.5 F  *CHILLS WITH OR WITHOUT FEVER  NAUSEA AND VOMITING THAT IS NOT CONTROLLED WITH YOUR NAUSEA MEDICATION  *UNUSUAL SHORTNESS OF BREATH  *UNUSUAL BRUISING OR BLEEDING  TENDERNESS IN MOUTH AND THROAT WITH OR WITHOUT PRESENCE OF ULCERS  *URINARY PROBLEMS  *BOWEL PROBLEMS  UNUSUAL RASH Items with * indicate a potential emergency and should be followed up as soon as possible.  Feel free to call the clinic should you have any questions or concerns. The clinic phone number is [phone].  Please show the [facility] at check-in to the Emergency Department and triage nurse.

## 2018-10-26 ENCOUNTER — Telehealth: Payer: Self-pay | Admitting: Nurse Practitioner

## 2018-10-26 ENCOUNTER — Other Ambulatory Visit: Payer: Self-pay | Admitting: Hematology

## 2018-10-26 LAB — AFP TUMOR MARKER

## 2018-10-26 NOTE — Telephone Encounter (Signed)
Scheduled appt per 6/24 los. Printed and mailed calendar. °

## 2018-10-30 ENCOUNTER — Other Ambulatory Visit: Payer: Self-pay | Admitting: Hematology

## 2018-10-30 ENCOUNTER — Ambulatory Visit (INDEPENDENT_AMBULATORY_CARE_PROVIDER_SITE_OTHER): Payer: Medicare Other | Admitting: *Deleted

## 2018-10-30 DIAGNOSIS — I4891 Unspecified atrial fibrillation: Secondary | ICD-10-CM

## 2018-10-31 ENCOUNTER — Telehealth: Payer: Self-pay | Admitting: *Deleted

## 2018-10-31 LAB — CUP PACEART REMOTE DEVICE CHECK
Date Time Interrogation Session: 20200629183933
Implantable Pulse Generator Implant Date: 20170628

## 2018-10-31 NOTE — Telephone Encounter (Signed)
Received call from son, Quita Skye who is in route to home in [place] stating that dad is having an issue with catching his breath sometimes & is asking if he can use his mom's O2.  Asked if they have a way to check O2 sat & he states they have checked & he drops 88-90 sometimes.  Informed Ok to try but with his own nasal canula & let us know if it helps.  He also states that pt may not got to ortho appt tomorrow due to being at 8 am.  He states that he or his brother plan to get him there if at all possible.  He wanted me to call & impress pt of importance of visit.  Tried to reach pt & no answer at home or mobile #.  Informed Dr Burr Medico.

## 2018-10-31 NOTE — Telephone Encounter (Signed)
Received call from pt's son stating that pt reports pain in his hip that started last night.  This is the L hip in which he has had replacement several years ago.  He reports a knot on his butt.  They have called ortho office & scheduled appt for 8 am tomorrow.  He is very uncomfortable & reports pain #5-6 which is consistent.  He states hip is sore & pain radiates.  There are no bruises. He has had several falls, last on Monday 1 wk ago.  He has taken tylenol without much relief.  Son is concerned & is asking about pain med.  Discussed with DR Burr Medico & she suggested Advil alternate with Tylenol but limit tylenol due to liver until ortho visit.  Antonio Manning expressed understanding. They don't want to take pt to ED.  Encouraged to call back if pain worse.  Discussed Immuno Alert Card if he needs to go to ED.

## 2018-11-01 DIAGNOSIS — M25552 Pain in left hip: Secondary | ICD-10-CM | POA: Diagnosis not present

## 2018-11-01 DIAGNOSIS — Z96642 Presence of left artificial hip joint: Secondary | ICD-10-CM | POA: Diagnosis not present

## 2018-11-02 ENCOUNTER — Emergency Department (HOSPITAL_COMMUNITY): Payer: Medicare Other

## 2018-11-02 ENCOUNTER — Encounter (HOSPITAL_COMMUNITY)
Admission: EM | Disposition: A | Discharge status: Home Health | Payer: Self-pay | Source: Home / Self Care | Attending: Internal Medicine

## 2018-11-02 ENCOUNTER — Inpatient Hospital Stay (HOSPITAL_COMMUNITY): Payer: Medicare Other

## 2018-11-02 ENCOUNTER — Encounter (HOSPITAL_COMMUNITY): Payer: Self-pay | Admitting: Certified Registered"

## 2018-11-02 ENCOUNTER — Encounter (HOSPITAL_COMMUNITY): Payer: Self-pay

## 2018-11-02 ENCOUNTER — Inpatient Hospital Stay (HOSPITAL_COMMUNITY)
Admission: EM | Admit: 2018-11-02 | Discharge: 2018-11-09 | DRG: 871 | Disposition: A | Discharge status: Home Health | Payer: Medicare Other | Attending: Internal Medicine | Admitting: Internal Medicine

## 2018-11-02 ENCOUNTER — Other Ambulatory Visit: Payer: Self-pay

## 2018-11-02 DIAGNOSIS — Z7951 Long term (current) use of inhaled steroids: Secondary | ICD-10-CM

## 2018-11-02 DIAGNOSIS — Z9221 Personal history of antineoplastic chemotherapy: Secondary | ICD-10-CM | POA: Diagnosis not present

## 2018-11-02 DIAGNOSIS — Z96642 Presence of left artificial hip joint: Secondary | ICD-10-CM | POA: Diagnosis present

## 2018-11-02 DIAGNOSIS — R52 Pain, unspecified: Secondary | ICD-10-CM

## 2018-11-02 DIAGNOSIS — Z79899 Other long term (current) drug therapy: Secondary | ICD-10-CM | POA: Diagnosis not present

## 2018-11-02 DIAGNOSIS — I471 Supraventricular tachycardia: Secondary | ICD-10-CM

## 2018-11-02 DIAGNOSIS — J690 Pneumonitis due to inhalation of food and vomit: Secondary | ICD-10-CM | POA: Diagnosis present

## 2018-11-02 DIAGNOSIS — I10 Essential (primary) hypertension: Secondary | ICD-10-CM | POA: Diagnosis not present

## 2018-11-02 DIAGNOSIS — K3189 Other diseases of stomach and duodenum: Secondary | ICD-10-CM | POA: Diagnosis not present

## 2018-11-02 DIAGNOSIS — Z8673 Personal history of transient ischemic attack (TIA), and cerebral infarction without residual deficits: Secondary | ICD-10-CM

## 2018-11-02 DIAGNOSIS — K209 Esophagitis, unspecified: Secondary | ICD-10-CM | POA: Diagnosis not present

## 2018-11-02 DIAGNOSIS — I712 Thoracic aortic aneurysm, without rupture: Secondary | ICD-10-CM | POA: Diagnosis present

## 2018-11-02 DIAGNOSIS — M2550 Pain in unspecified joint: Secondary | ICD-10-CM | POA: Diagnosis not present

## 2018-11-02 DIAGNOSIS — E876 Hypokalemia: Secondary | ICD-10-CM | POA: Diagnosis not present

## 2018-11-02 DIAGNOSIS — R0902 Hypoxemia: Secondary | ICD-10-CM | POA: Diagnosis not present

## 2018-11-02 DIAGNOSIS — I480 Paroxysmal atrial fibrillation: Secondary | ICD-10-CM | POA: Diagnosis not present

## 2018-11-02 DIAGNOSIS — R6520 Severe sepsis without septic shock: Secondary | ICD-10-CM | POA: Diagnosis present

## 2018-11-02 DIAGNOSIS — Z515 Encounter for palliative care: Secondary | ICD-10-CM

## 2018-11-02 DIAGNOSIS — Z7901 Long term (current) use of anticoagulants: Secondary | ICD-10-CM | POA: Diagnosis not present

## 2018-11-02 DIAGNOSIS — I4891 Unspecified atrial fibrillation: Secondary | ICD-10-CM

## 2018-11-02 DIAGNOSIS — Z79890 Hormone replacement therapy (postmenopausal): Secondary | ICD-10-CM | POA: Diagnosis not present

## 2018-11-02 DIAGNOSIS — K769 Liver disease, unspecified: Secondary | ICD-10-CM | POA: Diagnosis not present

## 2018-11-02 DIAGNOSIS — E440 Moderate protein-calorie malnutrition: Secondary | ICD-10-CM | POA: Insufficient documentation

## 2018-11-02 DIAGNOSIS — K2211 Ulcer of esophagus with bleeding: Secondary | ICD-10-CM | POA: Diagnosis present

## 2018-11-02 DIAGNOSIS — Z781 Physical restraint status: Secondary | ICD-10-CM

## 2018-11-02 DIAGNOSIS — I5043 Acute on chronic combined systolic (congestive) and diastolic (congestive) heart failure: Secondary | ICD-10-CM | POA: Diagnosis present

## 2018-11-02 DIAGNOSIS — Z87891 Personal history of nicotine dependence: Secondary | ICD-10-CM | POA: Diagnosis not present

## 2018-11-02 DIAGNOSIS — D62 Acute posthemorrhagic anemia: Secondary | ICD-10-CM | POA: Diagnosis present

## 2018-11-02 DIAGNOSIS — A419 Sepsis, unspecified organism: Secondary | ICD-10-CM | POA: Diagnosis present

## 2018-11-02 DIAGNOSIS — H9190 Unspecified hearing loss, unspecified ear: Secondary | ICD-10-CM | POA: Diagnosis present

## 2018-11-02 DIAGNOSIS — K2950 Unspecified chronic gastritis without bleeding: Secondary | ICD-10-CM | POA: Diagnosis not present

## 2018-11-02 DIAGNOSIS — Z96649 Presence of unspecified artificial hip joint: Secondary | ICD-10-CM

## 2018-11-02 DIAGNOSIS — E785 Hyperlipidemia, unspecified: Secondary | ICD-10-CM | POA: Diagnosis present

## 2018-11-02 DIAGNOSIS — R627 Adult failure to thrive: Secondary | ICD-10-CM | POA: Diagnosis present

## 2018-11-02 DIAGNOSIS — Z7189 Other specified counseling: Secondary | ICD-10-CM | POA: Diagnosis not present

## 2018-11-02 DIAGNOSIS — I509 Heart failure, unspecified: Secondary | ICD-10-CM | POA: Diagnosis not present

## 2018-11-02 DIAGNOSIS — K2961 Other gastritis with bleeding: Secondary | ICD-10-CM | POA: Diagnosis present

## 2018-11-02 DIAGNOSIS — I959 Hypotension, unspecified: Secondary | ICD-10-CM | POA: Diagnosis not present

## 2018-11-02 DIAGNOSIS — E039 Hypothyroidism, unspecified: Secondary | ICD-10-CM

## 2018-11-02 DIAGNOSIS — E872 Acidosis, unspecified: Secondary | ICD-10-CM

## 2018-11-02 DIAGNOSIS — R109 Unspecified abdominal pain: Secondary | ICD-10-CM

## 2018-11-02 DIAGNOSIS — D649 Anemia, unspecified: Secondary | ICD-10-CM

## 2018-11-02 DIAGNOSIS — M25552 Pain in left hip: Secondary | ICD-10-CM

## 2018-11-02 DIAGNOSIS — R0609 Other forms of dyspnea: Secondary | ICD-10-CM | POA: Diagnosis not present

## 2018-11-02 DIAGNOSIS — I429 Cardiomyopathy, unspecified: Secondary | ICD-10-CM | POA: Diagnosis present

## 2018-11-02 DIAGNOSIS — R1084 Generalized abdominal pain: Secondary | ICD-10-CM

## 2018-11-02 DIAGNOSIS — Z20828 Contact with and (suspected) exposure to other viral communicable diseases: Secondary | ICD-10-CM | POA: Diagnosis present

## 2018-11-02 DIAGNOSIS — G9341 Metabolic encephalopathy: Secondary | ICD-10-CM | POA: Diagnosis not present

## 2018-11-02 DIAGNOSIS — C220 Liver cell carcinoma: Secondary | ICD-10-CM | POA: Diagnosis present

## 2018-11-02 DIAGNOSIS — R630 Anorexia: Secondary | ICD-10-CM | POA: Diagnosis present

## 2018-11-02 DIAGNOSIS — K208 Other esophagitis: Secondary | ICD-10-CM | POA: Diagnosis not present

## 2018-11-02 DIAGNOSIS — E8729 Other acidosis: Secondary | ICD-10-CM

## 2018-11-02 DIAGNOSIS — J9601 Acute respiratory failure with hypoxia: Secondary | ICD-10-CM | POA: Diagnosis present

## 2018-11-02 DIAGNOSIS — G92 Toxic encephalopathy: Secondary | ICD-10-CM | POA: Diagnosis present

## 2018-11-02 DIAGNOSIS — K920 Hematemesis: Secondary | ICD-10-CM

## 2018-11-02 DIAGNOSIS — R531 Weakness: Secondary | ICD-10-CM | POA: Diagnosis not present

## 2018-11-02 DIAGNOSIS — R180 Malignant ascites: Secondary | ICD-10-CM | POA: Diagnosis present

## 2018-11-02 DIAGNOSIS — Z85828 Personal history of other malignant neoplasm of skin: Secondary | ICD-10-CM

## 2018-11-02 DIAGNOSIS — K2971 Gastritis, unspecified, with bleeding: Secondary | ICD-10-CM | POA: Diagnosis not present

## 2018-11-02 DIAGNOSIS — R0602 Shortness of breath: Secondary | ICD-10-CM

## 2018-11-02 DIAGNOSIS — K2210 Ulcer of esophagus without bleeding: Secondary | ICD-10-CM | POA: Diagnosis not present

## 2018-11-02 DIAGNOSIS — Z471 Aftercare following joint replacement surgery: Secondary | ICD-10-CM | POA: Diagnosis not present

## 2018-11-02 DIAGNOSIS — R5383 Other fatigue: Secondary | ICD-10-CM | POA: Diagnosis present

## 2018-11-02 DIAGNOSIS — G459 Transient cerebral ischemic attack, unspecified: Secondary | ICD-10-CM | POA: Diagnosis not present

## 2018-11-02 DIAGNOSIS — R110 Nausea: Secondary | ICD-10-CM | POA: Diagnosis not present

## 2018-11-02 DIAGNOSIS — I110 Hypertensive heart disease with heart failure: Secondary | ICD-10-CM | POA: Diagnosis present

## 2018-11-02 DIAGNOSIS — I472 Ventricular tachycardia: Secondary | ICD-10-CM | POA: Diagnosis not present

## 2018-11-02 DIAGNOSIS — R197 Diarrhea, unspecified: Secondary | ICD-10-CM | POA: Diagnosis not present

## 2018-11-02 DIAGNOSIS — R58 Hemorrhage, not elsewhere classified: Secondary | ICD-10-CM | POA: Diagnosis not present

## 2018-11-02 DIAGNOSIS — I5021 Acute systolic (congestive) heart failure: Secondary | ICD-10-CM | POA: Diagnosis not present

## 2018-11-02 DIAGNOSIS — I160 Hypertensive urgency: Secondary | ICD-10-CM | POA: Diagnosis present

## 2018-11-02 DIAGNOSIS — J90 Pleural effusion, not elsewhere classified: Secondary | ICD-10-CM | POA: Diagnosis not present

## 2018-11-02 DIAGNOSIS — R1110 Vomiting, unspecified: Secondary | ICD-10-CM | POA: Diagnosis not present

## 2018-11-02 DIAGNOSIS — Z7401 Bed confinement status: Secondary | ICD-10-CM | POA: Diagnosis not present

## 2018-11-02 DIAGNOSIS — J9811 Atelectasis: Secondary | ICD-10-CM | POA: Diagnosis not present

## 2018-11-02 DIAGNOSIS — K922 Gastrointestinal hemorrhage, unspecified: Secondary | ICD-10-CM | POA: Diagnosis not present

## 2018-11-02 LAB — URINALYSIS, ROUTINE W REFLEX MICROSCOPIC
Bilirubin Urine: NEGATIVE
Glucose, UA: 50 mg/dL — AB
Hgb urine dipstick: NEGATIVE
Ketones, ur: 20 mg/dL — AB
Leukocytes,Ua: NEGATIVE
Nitrite: NEGATIVE
Protein, ur: 100 mg/dL — AB
Specific Gravity, Urine: 1.014 (ref 1.005–1.030)
pH: 5 (ref 5.0–8.0)

## 2018-11-02 LAB — TROPONIN I (HIGH SENSITIVITY)
Troponin I (High Sensitivity): 10 ng/L (ref <18)
Troponin I (High Sensitivity): 25 ng/L — ABNORMAL HIGH (ref <18)
Troponin I (High Sensitivity): 36 ng/L — ABNORMAL HIGH (ref <18)

## 2018-11-02 LAB — CBC WITH DIFFERENTIAL/PLATELET
Abs Immature Granulocytes: 0.1 10*3/uL — ABNORMAL HIGH (ref 0.00–0.07)
Basophils Absolute: 0 10*3/uL (ref 0.0–0.1)
Basophils Relative: 0 %
Eosinophils Absolute: 0 10*3/uL (ref 0.0–0.5)
Eosinophils Relative: 0 %
HCT: 23.7 % — ABNORMAL LOW (ref 39.0–52.0)
Hemoglobin: 7.4 g/dL — ABNORMAL LOW (ref 13.0–17.0)
Immature Granulocytes: 1 %
Lymphocytes Relative: 7 %
Lymphs Abs: 0.7 10*3/uL (ref 0.7–4.0)
MCH: 31.2 pg (ref 26.0–34.0)
MCHC: 31.2 g/dL (ref 30.0–36.0)
MCV: 100 fL (ref 80.0–100.0)
Monocytes Absolute: 0.9 10*3/uL (ref 0.1–1.0)
Monocytes Relative: 9 %
Neutro Abs: 8.4 10*3/uL — ABNORMAL HIGH (ref 1.7–7.7)
Neutrophils Relative %: 83 %
Platelets: 249 10*3/uL (ref 150–400)
RBC: 2.37 MIL/uL — ABNORMAL LOW (ref 4.22–5.81)
RDW: 16 % — ABNORMAL HIGH (ref 11.5–15.5)
WBC: 10.1 10*3/uL (ref 4.0–10.5)
nRBC: 0 % (ref 0.0–0.2)

## 2018-11-02 LAB — CBC
HCT: 23.2 % — ABNORMAL LOW (ref 39.0–52.0)
Hemoglobin: 6.6 g/dL — CL (ref 13.0–17.0)
MCH: 30.4 pg (ref 26.0–34.0)
MCHC: 28.4 g/dL — ABNORMAL LOW (ref 30.0–36.0)
MCV: 106.9 fL — ABNORMAL HIGH (ref 80.0–100.0)
Platelets: 323 10*3/uL (ref 150–400)
RBC: 2.17 MIL/uL — ABNORMAL LOW (ref 4.22–5.81)
RDW: 16.9 % — ABNORMAL HIGH (ref 11.5–15.5)
WBC: 12 10*3/uL — ABNORMAL HIGH (ref 4.0–10.5)
nRBC: 0 % (ref 0.0–0.2)

## 2018-11-02 LAB — LACTIC ACID, PLASMA
Lactic Acid, Venous: >11 mmol/L (ref 0.5–1.9)
Lactic Acid, Venous: >11 mmol/L (ref 0.5–1.9)
Lactic Acid, Venous: 8.8 mmol/L (ref 0.5–1.9)

## 2018-11-02 LAB — HEPATIC FUNCTION PANEL
ALT: 21 U/L (ref 0–44)
AST: 69 U/L — ABNORMAL HIGH (ref 15–41)
Albumin: 4.2 g/dL (ref 3.5–5.0)
Alkaline Phosphatase: 120 U/L (ref 38–126)
Bilirubin, Direct: 0.4 mg/dL — ABNORMAL HIGH (ref 0.0–0.2)
Indirect Bilirubin: 0.8 mg/dL (ref 0.3–0.9)
Total Bilirubin: 1.2 mg/dL (ref 0.3–1.2)
Total Protein: 6.8 g/dL (ref 6.5–8.1)

## 2018-11-02 LAB — PROTIME-INR
INR: 1.7 — ABNORMAL HIGH (ref 0.8–1.2)
Prothrombin Time: 20.1 seconds — ABNORMAL HIGH (ref 11.4–15.2)

## 2018-11-02 LAB — COMPREHENSIVE METABOLIC PANEL
ALT: 82 U/L — ABNORMAL HIGH (ref 0–44)
AST: 239 U/L — ABNORMAL HIGH (ref 15–41)
Albumin: 4.1 g/dL (ref 3.5–5.0)
Alkaline Phosphatase: 131 U/L — ABNORMAL HIGH (ref 38–126)
Anion gap: 18 — ABNORMAL HIGH (ref 5–15)
BUN: 23 mg/dL (ref 8–23)
CO2: 21 mmol/L — ABNORMAL LOW (ref 22–32)
Calcium: 8.6 mg/dL — ABNORMAL LOW (ref 8.9–10.3)
Chloride: 102 mmol/L (ref 98–111)
Creatinine, Ser: 0.88 mg/dL (ref 0.61–1.24)
GFR calc Af Amer: >60 mL/min (ref >60)
GFR calc non Af Amer: >60 mL/min (ref >60)
Glucose, Bld: 120 mg/dL — ABNORMAL HIGH (ref 70–99)
Potassium: 4.1 mmol/L (ref 3.5–5.1)
Sodium: 141 mmol/L (ref 135–145)
Total Bilirubin: 1.2 mg/dL (ref 0.3–1.2)
Total Protein: 6.3 g/dL — ABNORMAL LOW (ref 6.5–8.1)

## 2018-11-02 LAB — PREPARE RBC (CROSSMATCH)

## 2018-11-02 LAB — MRSA PCR SCREENING: MRSA by PCR: NEGATIVE

## 2018-11-02 LAB — BASIC METABOLIC PANEL
Anion gap: 33 — ABNORMAL HIGH (ref 5–15)
BUN: 23 mg/dL (ref 8–23)
CO2: 11 mmol/L — ABNORMAL LOW (ref 22–32)
Calcium: 9.3 mg/dL (ref 8.9–10.3)
Chloride: 96 mmol/L — ABNORMAL LOW (ref 98–111)
Creatinine, Ser: 1.14 mg/dL (ref 0.61–1.24)
GFR calc Af Amer: >60 mL/min (ref >60)
GFR calc non Af Amer: >60 mL/min (ref >60)
Glucose, Bld: 143 mg/dL — ABNORMAL HIGH (ref 70–99)
Potassium: 3.6 mmol/L (ref 3.5–5.1)
Sodium: 140 mmol/L (ref 135–145)

## 2018-11-02 LAB — MAGNESIUM
Magnesium: 2 mg/dL (ref 1.7–2.4)
Magnesium: 1.5 mg/dL — ABNORMAL LOW (ref 1.7–2.4)

## 2018-11-02 LAB — BLOOD GAS, ARTERIAL
Acid-base deficit: 0.8 mmol/L (ref 0.0–2.0)
Bicarbonate: 22.5 mmol/L (ref 20.0–28.0)
Drawn by: 270211
O2 Content: 4 L/min
O2 Saturation: 98.4 %
Patient temperature: 98.6
pCO2 arterial: 33.1 mmHg (ref 32.0–48.0)
pH, Arterial: 7.447 (ref 7.350–7.450)
pO2, Arterial: 102 mmHg (ref 83.0–108.0)

## 2018-11-02 LAB — SARS CORONAVIRUS 2 BY RT PCR (HOSPITAL ORDER, PERFORMED IN ~~LOC~~ HOSPITAL LAB): SARS Coronavirus 2: NEGATIVE

## 2018-11-02 LAB — APTT: aPTT: 46 seconds — ABNORMAL HIGH (ref 24–36)

## 2018-11-02 LAB — AMMONIA: Ammonia: 41 umol/L — ABNORMAL HIGH (ref 9–35)

## 2018-11-02 LAB — T4, FREE: Free T4: 0.97 ng/dL (ref 0.61–1.12)

## 2018-11-02 LAB — CBG MONITORING, ED: Glucose-Capillary: 119 mg/dL — ABNORMAL HIGH (ref 70–99)

## 2018-11-02 LAB — POC OCCULT BLOOD, ED: Fecal Occult Bld: NEGATIVE

## 2018-11-02 LAB — LIPASE, BLOOD: Lipase: 29 U/L (ref 11–51)

## 2018-11-02 LAB — PROCALCITONIN: Procalcitonin: 1.01 ng/mL

## 2018-11-02 LAB — CK: Total CK: 24 U/L — ABNORMAL LOW (ref 49–397)

## 2018-11-02 LAB — TSH: TSH: 29.022 u[IU]/mL — ABNORMAL HIGH (ref 0.350–4.500)

## 2018-11-02 SURGERY — CANCELLED PROCEDURE

## 2018-11-02 MED ORDER — COQ10 100 MG PO CAPS: 100.0000 mg | ORAL_CAPSULE | Freq: Every day | ORAL | Status: DC

## 2018-11-02 MED ORDER — SODIUM CHLORIDE 0.9 % IV SOLN
INTRAVENOUS | Status: DC
  Administered 2018-11-02 – 2018-11-03 (×2): via INTRAVENOUS

## 2018-11-02 MED ORDER — CHLORHEXIDINE GLUCONATE CLOTH 2 % EX PADS
6.0000 | MEDICATED_PAD | Freq: Every day | CUTANEOUS | Status: DC
  Administered 2018-11-03 – 2018-11-05 (×3): 6 via TOPICAL

## 2018-11-02 MED ORDER — CHLORHEXIDINE GLUCONATE CLOTH 2 % EX PADS: 6.0000 | MEDICATED_PAD | Freq: Every day | CUTANEOUS | Status: DC

## 2018-11-02 MED ORDER — METRONIDAZOLE IN NACL 5-0.79 MG/ML-% IV SOLN
500.0000 mg | Freq: Once | INTRAVENOUS | Status: AC
  Administered 2018-11-02: 500 mg via INTRAVENOUS
  Filled 2018-11-02: qty 100

## 2018-11-02 MED ORDER — PROPOFOL 10 MG/ML IV BOLUS
INTRAVENOUS | Status: AC
  Filled 2018-11-02: qty 40

## 2018-11-02 MED ORDER — ACETAMINOPHEN 325 MG PO TABS
650.0000 mg | ORAL_TABLET | Freq: Four times a day (QID) | ORAL | Status: DC | PRN
  Administered 2018-11-04 – 2018-11-07 (×2): 650 mg via ORAL
  Filled 2018-11-02 (×2): qty 2

## 2018-11-02 MED ORDER — VANCOMYCIN HCL IN DEXTROSE 1-5 GM/200ML-% IV SOLN
1000.0000 mg | INTRAVENOUS | Status: DC
  Administered 2018-11-03: 1000 mg via INTRAVENOUS
  Filled 2018-11-02: qty 200

## 2018-11-02 MED ORDER — ONDANSETRON HCL 4 MG PO TABS
4.0000 mg | ORAL_TABLET | Freq: Four times a day (QID) | ORAL | Status: DC | PRN
  Administered 2018-11-09: 4 mg via ORAL
  Filled 2018-11-02: qty 1

## 2018-11-02 MED ORDER — SODIUM CHLORIDE 0.9 % IV BOLUS
500.0000 mL | Freq: Once | INTRAVENOUS | Status: AC
  Administered 2018-11-02: 500 mL via INTRAVENOUS

## 2018-11-02 MED ORDER — VANCOMYCIN HCL 10 G IV SOLR
1250.0000 mg | Freq: Once | INTRAVENOUS | Status: AC
  Administered 2018-11-02: 1250 mg via INTRAVENOUS
  Filled 2018-11-02: qty 1250

## 2018-11-02 MED ORDER — VITAMIN C 500 MG PO TABS
250.0000 mg | ORAL_TABLET | Freq: Every day | ORAL | Status: DC
  Administered 2018-11-04 – 2018-11-09 (×6): 250 mg via ORAL
  Filled 2018-11-02 (×6): qty 1

## 2018-11-02 MED ORDER — SODIUM CHLORIDE 0.9% IV SOLUTION
Freq: Once | INTRAVENOUS | Status: AC
  Administered 2018-11-02: 10:00:00 via INTRAVENOUS

## 2018-11-02 MED ORDER — ADULT MULTIVITAMIN W/MINERALS CH
1.0000 | ORAL_TABLET | Freq: Every day | ORAL | Status: DC
  Filled 2018-11-02: qty 1

## 2018-11-02 MED ORDER — SODIUM CHLORIDE 0.9 % IV SOLN
2.0000 g | Freq: Once | INTRAVENOUS | Status: AC
  Administered 2018-11-02: 2 g via INTRAVENOUS
  Filled 2018-11-02: qty 2

## 2018-11-02 MED ORDER — ATORVASTATIN CALCIUM 40 MG PO TABS: 40.0000 mg | ORAL_TABLET | Freq: Every day | ORAL | Status: DC

## 2018-11-02 MED ORDER — VANCOMYCIN HCL IN DEXTROSE 1-5 GM/200ML-% IV SOLN
1000.0000 mg | Freq: Once | INTRAVENOUS | Status: DC
  Filled 2018-11-02: qty 200

## 2018-11-02 MED ORDER — IOHEXOL 300 MG/ML  SOLN: 30.0000 mL | Freq: Once | INTRAMUSCULAR | Status: DC

## 2018-11-02 MED ORDER — SODIUM CHLORIDE 0.9 % IV BOLUS (SEPSIS)
1750.0000 mL | Freq: Once | INTRAVENOUS | Status: AC
  Administered 2018-11-02: 1750 mL via INTRAVENOUS

## 2018-11-02 MED ORDER — DILTIAZEM HCL 100 MG IV SOLR
5.0000 mg/h | INTRAVENOUS | Status: DC
  Administered 2018-11-02: 5 mg/h via INTRAVENOUS
  Filled 2018-11-02 (×2): qty 100

## 2018-11-02 MED ORDER — DILTIAZEM LOAD VIA INFUSION
10.0000 mg | Freq: Once | INTRAVENOUS | Status: AC
  Administered 2018-11-02: 10 mg via INTRAVENOUS
  Filled 2018-11-02: qty 10

## 2018-11-02 MED ORDER — OCTREOTIDE LOAD VIA INFUSION
50.0000 ug | Freq: Once | INTRAVENOUS | Status: AC
  Administered 2018-11-02: 50 ug via INTRAVENOUS
  Filled 2018-11-02: qty 25

## 2018-11-02 MED ORDER — ORAL CARE MOUTH RINSE
15.0000 mL | Freq: Two times a day (BID) | OROMUCOSAL | Status: DC
  Administered 2018-11-02 – 2018-11-09 (×10): 15 mL via OROMUCOSAL

## 2018-11-02 MED ORDER — ONDANSETRON HCL 4 MG/2ML IJ SOLN
4.0000 mg | Freq: Four times a day (QID) | INTRAMUSCULAR | Status: DC | PRN
  Administered 2018-11-02: 4 mg via INTRAVENOUS
  Filled 2018-11-02: qty 2

## 2018-11-02 MED ORDER — SODIUM CHLORIDE 0.9 % IV SOLN
80.0000 mg | Freq: Once | INTRAVENOUS | Status: AC
  Administered 2018-11-02: 80 mg via INTRAVENOUS
  Filled 2018-11-02: qty 80

## 2018-11-02 MED ORDER — IOHEXOL 300 MG/ML  SOLN
100.0000 mL | Freq: Once | INTRAMUSCULAR | Status: AC | PRN
  Administered 2018-11-02: 100 mL via INTRAVENOUS

## 2018-11-02 MED ORDER — SODIUM CHLORIDE (PF) 0.9 % IJ SOLN
INTRAMUSCULAR | Status: AC
  Filled 2018-11-02: qty 50

## 2018-11-02 MED ORDER — SODIUM CHLORIDE 0.9% IV SOLUTION: Freq: Once | INTRAVENOUS | Status: DC

## 2018-11-02 MED ORDER — LEVOTHYROXINE SODIUM 50 MCG PO TABS
50.0000 ug | ORAL_TABLET | Freq: Every day | ORAL | Status: DC
  Administered 2018-11-05 – 2018-11-09 (×5): 50 ug via ORAL
  Filled 2018-11-02 (×6): qty 1

## 2018-11-02 MED ORDER — TRAMADOL HCL 50 MG PO TABS
50.0000 mg | ORAL_TABLET | Freq: Four times a day (QID) | ORAL | Status: DC | PRN
  Administered 2018-11-03: 50 mg via ORAL
  Filled 2018-11-02: qty 1

## 2018-11-02 MED ORDER — SODIUM CHLORIDE 0.9 % IV SOLN
50.0000 ug/h | INTRAVENOUS | Status: DC
  Administered 2018-11-02: 50 ug/h via INTRAVENOUS
  Filled 2018-11-02 (×2): qty 1

## 2018-11-02 MED ORDER — MIRTAZAPINE 15 MG PO TABS
7.5000 mg | ORAL_TABLET | Freq: Every day | ORAL | Status: DC
  Administered 2018-11-03: 7.5 mg via ORAL
  Filled 2018-11-02: qty 1

## 2018-11-02 MED ORDER — ACETAMINOPHEN 650 MG RE SUPP: 650.0000 mg | Freq: Four times a day (QID) | RECTAL | Status: DC | PRN

## 2018-11-02 MED ORDER — SODIUM CHLORIDE 0.9 % IV SOLN
8.0000 mg/h | INTRAVENOUS | Status: DC
  Administered 2018-11-02 – 2018-11-03 (×2): 8 mg/h via INTRAVENOUS
  Filled 2018-11-02 (×2): qty 80

## 2018-11-02 MED ORDER — ALBUTEROL SULFATE (2.5 MG/3ML) 0.083% IN NEBU: 2.5000 mg | INHALATION_SOLUTION | RESPIRATORY_TRACT | Status: DC | PRN

## 2018-11-02 MED ORDER — SODIUM CHLORIDE 0.9 % IV SOLN
2.0000 g | Freq: Two times a day (BID) | INTRAVENOUS | Status: DC
  Administered 2018-11-02 – 2018-11-03 (×2): 2 g via INTRAVENOUS
  Filled 2018-11-02 (×3): qty 2

## 2018-11-02 MED ORDER — SODIUM CHLORIDE 0.9 % IV SOLN: INTRAVENOUS | Status: DC

## 2018-11-02 MED ORDER — MAGNESIUM SULFATE 2 GM/50ML IV SOLN
2.0000 g | Freq: Once | INTRAVENOUS | Status: AC
  Administered 2018-11-02: 2 g via INTRAVENOUS
  Filled 2018-11-02: qty 50

## 2018-11-02 SURGICAL SUPPLY — 15 items

## 2018-11-02 NOTE — ED Triage Notes (Signed)
Pt presents via EMS from home c/o weakness and L hip discomfort. Pt had a previous dislocation of L hip that was treated and sent home w/ tramadol. Since that event he has had diarrhea and weakness.Hx of liver CA. No reported fevers. Chemo every 2 weeks and is in Palliative care. Alert and oriented. HOH.

## 2018-11-02 NOTE — Progress Notes (Signed)
Pharmacy Antibiotic Note  Antonio Manning is a 78 y.o. male admitted on 11/02/2018 with sepsis.  Pharmacy has been consulted for cefepime and vancomycin dosing. Pt hypothermic, WBC 12. Lactate > 11. HR 153. CXR: pleural effusion.   Plan: Cefepime 2 gm IV q12 Vancomycin 1250 mg IV loading dose Vancomycin 1000 mg IV Q 24 hrs. Goal AUC 400-550. Expected AUC: 458.8   SCr used: 1.14, TBW F/u renal function, WBC, temp, culture data Vancomycin levels as needed  Height: 6' (182.9 cm) Weight: 148 lb (67.1 kg) IBW/kg (Calculated) : 77.6  Temp (24hrs), Avg:94.6 F (34.8 C), Min:94.2 F (34.6 C), Max:95.5 F (35.3 C)  Recent Labs  Lab 11/02/18 0827 11/02/18 1020  WBC 12.0*  --   CREATININE 1.14  --   LATICACIDVEN >11.0* >11.0*    Estimated Creatinine Clearance: 49 mL/min (by C-G formula based on SCr of 1.14 mg/dL).    Allergies  Allergen Reactions  . Codeine Nausea And Vomiting   Antimicrobials this admission:  7/2 vanc>> 7/2 cefepime>> 72/ Flagyl x 1 dose Dose adjustments this admission:   Microbiology results:  6.2 SARS CoV2 neg 7/2 UCx: sent 7/2 BCx: sent 7/2 MRSA PCR: ordered Thank you for allowing pharmacy to be a part of this patient's care.  Eudelia Bunch, Pharm.D 11/02/2018 12:29 PM

## 2018-11-02 NOTE — ED Notes (Signed)
Hospitalist bedside 

## 2018-11-02 NOTE — Progress Notes (Signed)
Spoke with GI MD Brahmbhatt, will await results of recent CBC, if HBG >8, will hold second unit of blood. If HGB <8 will transfuse 2nd unit PRBC after CT scan completed prior to planned endoscopy on 11/03/18.

## 2018-11-02 NOTE — H&P (Addendum)
History and Physical    Antonio Manning ATF:[id number] DOB: 17-Aug-1940 DOA: 11/02/2018  PCP: Hulan Fess, MD   Patient coming from: Home via EMS  Chief Complaint: Weakness, Emesis  HPI: Antonio Manning is a 78 y.o. 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  Patient states that he has not been feeling well since yesterday and was not able but comfortable said he had a difficult time breathing but denied chest pain or cough.  He states that he denies any fevers or chills but felt very fatigued and weak and states that he did have some vomiting which is dark in nature.  Had a very large bowel movement last night.  He denies any falls but reports having left hip pain which is improved now.  States because of his shortness of breath and has fatigue he could not get comfortable so he went lay down outside yesterday and stated that the entire night on his porch.  He was brought into the emergency room for further evaluation and the EDP worked the patient up and had some basic blood work done.  Patient's hemoglobin on admission was 6.6 and has been down and because he was having emesis which was dark in nature there is concern for GI bleed.  Because of patient's rectal temperature on admission was low he is started on broad-spectrum antibiotics and sepsis protocol initiated.  TRH was called admit this patient for severe sepsis, symptomatic anemia with concern for upper GI bleed, as well as atrial fibrillation with RVR.  Of note lactic acid was severely elevated at over 112  and Dr. Sherry Ruffing of the emergency room discussed the case with Dr. Lamonte Sakai of critical care who feels that the patient is still stable for medical admission to [facility].  ED Course: In the ED patient had basic blood work done and had a chest x-ray, as well as being placed on broad-spectrum antibiotics and given sepsis protocol fluids.  He was also typed and screened and transfused 1 unit PRBCs.  I asked the GI doctor to see the patient and Dr. Alessandra Bevels evaluated the patient in order to take the patient for an EGD however this was canceled by anesthesiology.  I have also asked cardiology to weigh in as well as pulmonary critical care as well as palliative care services and medical oncology.  Review of Systems: As per HPI otherwise 10 point review of systems   Past Medical History:  Diagnosis Date   Arthritis 06/12/11   osteoarthritis,lt. hip, and neck area   Cancer ([place]) 2011-06-12   skin cancer lesions-multiple   Cataract 06-12-2011   right   Cholesterol serum elevated 2011-06-12   tx. Lipitor   Expressive aphasia 10/2015   Hearing loss 06-12-11   wears hearing aids bilaterally   Kidney calculi June 12, 2011   past hx. x1-passed on own, mild prostate issues-no meds   PONV (postoperative nausea and vomiting) 2011/06/12   with cataract surgery   Stroke ([place]) 10/2015   Past Surgical History:  Procedure Laterality Date   BACK SURGERY  Jun 12, 2011   Lumbar disc surgery-no hardware   CATARACT EXTRACTION W/ INTRAOCULAR  LENS IMPLANT  05-13-11   left eye   EP IMPLANTABLE DEVICE N/A 10/29/2015   Procedure: Loop Recorder Insertion;  Surgeon: Evans Lance, MD;  Location: [facility];  Service: Cardiovascular;  Laterality: N/A;   EYE SURGERY     eye lid lift    I&D EXTREMITY Right 01/17/2016   Procedure: IRRIGATION AND DEBRIDEMENT LEG LACERATION;  Surgeon: Vickey Huger, MD;  Location: [facility];  Service: Orthopedics;  Laterality: Right;   TEE WITHOUT CARDIOVERSION N/A 10/29/2015   Procedure:  TRANSESOPHAGEAL ECHOCARDIOGRAM (TEE);  Surgeon: Larey Dresser, MD;  Location: [facility];  Service: Cardiovascular;  Laterality: N/A;   TOTAL HIP ARTHROPLASTY  05/20/2011   Procedure: TOTAL HIP ARTHROPLASTY ANTERIOR APPROACH;  Surgeon: Mauri Pole, MD;  Location: [facility];  Service: Orthopedics;  Laterality: Left;   SOCIAL HISTORY   reports that he quit smoking about 30 years ago. His smoking use included cigarettes. He quit after 25.00 years of use. He has never used smokeless tobacco. He reports that he does not drink alcohol or use drugs.  Allergies  Allergen Reactions   Codeine Nausea And Vomiting   Family History  Problem Relation Age of Onset   Heart Problems Mother    Cancer Father    Cancer Brother    Stroke Brother    Heart Problems Brother    Cancer Brother    Heart Problems Brother    Heart Problems Sister    Hyperlipidemia Sister    Birth defects Child    Other Child        one kidney   Prior to Admission medications   Medication Sig Start Date End Date Taking? Authorizing Provider  amLODipine (NORVASC) 2.5 MG tablet Take 1 tablet (2.5 mg total) by mouth daily. 10/25/18  Yes Alla Feeling, NP  atorvastatin (LIPITOR) 40 MG tablet Take 40 mg by mouth daily.  03/24/18  Yes [provider]  calcium carbonate (TUMS - DOSED IN MG ELEMENTAL CALCIUM) 500 MG chewable tablet Chew 1 tablet by mouth 3 (three) times daily as needed for indigestion or heartburn.   Yes [provider]  Coenzyme Q10 (COQ10) 100 MG CAPS Take 100 mg by mouth daily.   Yes [provider]  ELIQUIS 5 MG TABS tablet TAKE 1 TABLET BY MOUTH TWICE A DAY 12/27/17  Yes Evans Lance, MD  levothyroxine (SYNTHROID) 50 MCG tablet Take 50 mcg by mouth daily before breakfast.   Yes [provider]  mirtazapine (REMERON) 7.5 MG tablet TAKE 1 TABLET BY MOUTH AT BEDTIME. 10/30/18  Yes [name], MD  Multiple Vitamin (MULTIVITAMIN) tablet Take 1 tablet by mouth daily.    Yes [provider]  ondansetron (ZOFRAN) 8 MG tablet Take 1 tablet (8 mg total) by mouth 2 (two) times daily as needed (Nausea or vomiting). 08/09/18  Yes [name], MD  prochlorperazine (COMPAZINE) 10 MG tablet TAKE 1 TABLET (10 MG TOTAL) BY MOUTH EVERY 6 (SIX) HOURS AS NEEDED FOR NAUSEA OR VOMITING. 10/31/18  Yes [name], MD  vitamin C (ASCORBIC ACID) 250 MG tablet Take 250 mg by mouth daily.   Yes [provider]   Physical Exam: Vitals:   11/02/18 1415 11/02/18 1451 11/02/18 1538 11/02/18 1600  BP: (!) 147/104 (!) 147/126 (!) 146/86   Pulse: (!) 105 (!) 116    Resp: 20 20    Temp:  98 F (36.7 C)  97.9 F (36.6 C)  TempSrc:  Oral  Oral  SpO2:  98%    Weight:      Height:       Constitutional: Chronically ill appearing Caucasian male in NAD and appears calm but uncomfortable  Eyes: Lids and conjunctivae normal, sclerae anicteric  ENMT: External Ears, Nose appear normal. Grossly normal hearing. Mucous membranes are moist.  Neck: Appears normal, supple, no cervical masses, normal ROM, no appreciable thyromegaly; no JVD Respiratory: Diminished to auscultation bilaterally, no wheezing, rales, rhonchi or crackles.Wearing supplemental O2 via [place] Cardiovascular: Irregularly Irregular and tachycardic , no murmurs / rubs / gallops. S1 and S2 auscultated. Has pedal edema with Left foot worse than right Abdomen: Soft,Tender to palpate, slightly distended. No masses palpated. No appreciable hepatosplenomegaly. Bowel sounds positive x3.  GU: Deferred. Musculoskeletal: No clubbing / cyanosis of digits/nails. No joint deformity upper and lower extremities. Skin: No rashes, lesions, ulcers on a limited skin evaluation. No induration; Warm and dry.  Neurologic: CN 2-12 grossly intact with no focal deficits. Romberg sign cerebellar reflexes not assessed.  Psychiatric: Normal judgment and insight. Alert and oriented x 3. Anxious mood and appropriate affect.   Labs on Admission: I  have personally reviewed following labs and imaging studies  CBC: Recent Labs  Lab 11/02/18 0827  WBC 12.0*  HGB 6.6*  HCT 23.2*  MCV 106.9*  PLT 914   Basic Metabolic Panel: Recent Labs  Lab 11/02/18 0827  NA 140  K 3.6  CL 96*  CO2 11*  GLUCOSE 143*  BUN 23  CREATININE 1.14  CALCIUM 9.3  MG 2.0   GFR: Estimated Creatinine Clearance: 49 mL/min (by C-G formula based on SCr of 1.14 mg/dL). Liver Function Tests: Recent Labs  Lab 11/02/18 0827  AST 69*  ALT 21  ALKPHOS 120  BILITOT 1.2  PROT 6.8  ALBUMIN 4.2   Recent Labs  Lab 11/02/18 0827  LIPASE 29   Recent Labs  Lab 11/02/18 0914  AMMONIA 41*   Coagulation Profile: Recent Labs  Lab 11/02/18 0830  INR 1.7*   Cardiac Enzymes: Recent Labs  Lab 11/02/18 0827  CKTOTAL 24*   BNP (last 3 results) No results for input(s): PROBNP in the last 8760 hours. HbA1C: No results for input(s): HGBA1C in the last 72 hours. CBG: Recent Labs  Lab 11/02/18 0828  GLUCAP 119*   Lipid Profile: No results for input(s): CHOL, HDL, LDLCALC, TRIG, CHOLHDL, LDLDIRECT in the last 72 hours. Thyroid Function Tests: Recent Labs    11/02/18 0914  TSH 29.022*  FREET4 0.97   Anemia Panel: No results for input(s): VITAMINB12, FOLATE, FERRITIN, TIBC, IRON, RETICCTPCT in the last 72 hours. Urine analysis:    Component Value Date/Time   COLORURINE YELLOW 11/02/2018 1200   APPEARANCEUR HAZY (A) 11/02/2018 1200   LABSPEC 1.014 11/02/2018 1200   PHURINE 5.0 11/02/2018 1200   GLUCOSEU 50 (A) 11/02/2018 1200   HGBUR NEGATIVE 11/02/2018 1200   BILIRUBINUR NEGATIVE 11/02/2018 1200   KETONESUR 20 (A) 11/02/2018 1200   PROTEINUR 100 (A) 11/02/2018 1200   UROBILINOGEN 0.2 05/13/2011 0930   NITRITE NEGATIVE 11/02/2018 1200   LEUKOCYTESUR NEGATIVE 11/02/2018 1200   Sepsis Labs: !!!!!!!!!!!!!!!!!!!!!!!!!!!!!!!!!!!!!!!!!!!! @LABRCNTIP (procalcitonin:4,lacticidven:4) ) Recent Results (from the past 240 hour(s))  SARS  Coronavirus 2 (CEPHEID- Performed in [facility] hospital lab), Hosp Order     Status: None   Collection Time: 11/02/18  8:27 AM   Specimen: Nasopharyngeal Swab  Result Value Ref Range Status   SARS Coronavirus 2 NEGATIVE NEGATIVE Final    Comment: (NOTE) If result  is NEGATIVE SARS-CoV-2 target nucleic acids are NOT DETECTED. The SARS-CoV-2 RNA is generally detectable in upper and lower  respiratory specimens during the acute phase of infection. The lowest  concentration of SARS-CoV-2 viral copies this assay can detect is 250  copies / mL. A negative result does not preclude SARS-CoV-2 infection  and should not be used as the sole basis for treatment or other  patient management decisions.  A negative result may occur with  improper specimen collection / handling, submission of specimen other  than nasopharyngeal swab, presence of viral mutation(s) within the  areas targeted by this assay, and inadequate number of viral copies  (<250 copies / mL). A negative result must be combined with clinical  observations, patient history, and epidemiological information. If result is POSITIVE SARS-CoV-2 target nucleic acids are DETECTED. The SARS-CoV-2 RNA is generally detectable in upper and lower  respiratory specimens dur ing the acute phase of infection.  Positive  results are indicative of active infection with SARS-CoV-2.  Clinical  correlation with patient history and other diagnostic information is  necessary to determine patient infection status.  Positive results do  not rule out bacterial infection or co-infection with other viruses. If result is PRESUMPTIVE POSTIVE SARS-CoV-2 nucleic acids MAY BE PRESENT.   A presumptive positive result was obtained on the submitted specimen  and confirmed on repeat testing.  While 2019 novel coronavirus  (SARS-CoV-2) nucleic acids may be present in the submitted sample  additional confirmatory testing may be necessary for epidemiological  and / or  clinical management purposes  to differentiate between  SARS-CoV-2 and other Sarbecovirus currently known to infect humans.  If clinically indicated additional testing with an alternate test  methodology [phone]) is advised. The SARS-CoV-2 RNA is generally  detectable in upper and lower respiratory sp ecimens during the acute  phase of infection. The expected result is Negative. Fact Sheet for Patients:  StrictlyIdeas.no Fact Sheet for Healthcare Providers: BankingDealers.co.za This test is not yet approved or cleared by the [place] FDA and has been authorized for detection and/or diagnosis of SARS-CoV-2 by FDA under an Emergency Use Authorization (EUA).  This EUA will remain in effect (meaning this test can be used) for the duration of the COVID-19 declaration under Section 564(b)(1) of the Act, 21 U.S.C. section 360bbb-3(b)(1), unless the authorization is terminated or revoked sooner. Performed at [facility], [place] [address], [place], [place] [address]     Radiological Exams on Admission: Dg Chest Portable 1 View  Result Date: 11/02/2018 CLINICAL DATA:  Diarrhea, weakness, history of liver cancer EXAM: PORTABLE CHEST 1 VIEW COMPARISON:  08/11/2018 FINDINGS: Trace right pleural effusion. Mild right basilar atelectasis. No pneumothorax. Stable cardiomediastinal silhouette. No aggressive osseous lesion. IMPRESSION: Trace right pleural effusion with atelectasis. Electronically Signed   By: Kathreen Devoid   On: 11/02/2018 10:17   EKG: Independently reviewed. Showed Atrial Fibrillation on my interpretation  Assessment/Plan Principal Problem:   Sepsis ([place]) Active Problems:   S/P left hip replacement   Fatigue   Hyperlipidemia   History of CVA (cerebrovascular accident)   PAF (paroxysmal atrial fibrillation) (HCC)   Liver lesion   Hepatocellular carcinoma (HCC)   Lactic acidosis   High anion gap metabolic  acidosis   Symptomatic anemia   Coffee ground emesis   Hypothyroidism   Atrial fibrillation with RVR (HCC)   Abdominal pain   Left hip pain  Severe sepsis of unknown etiology -Patient presented with hypothermia, tachycardia, elevated WBC on admission -  Was given 30 mL's per KG per sepsis bolus and started on broad-spectrum antibiotics; will continue IV vancomycin and IV cefepime next-obtain blood cultures x2, urinalysis and urine culture, -Urinalysis showed hazy appearance with 50 glucose, 20 ketones, negative nitrites, negative leukocytes, rare bacteria, 0-5 WBCs and urine culture still pending -Chest x-ray done showed "Trace right pleural effusion with atelectasis" -SARS-CoV-2 testing was negative -Because the patient was complaining of abdominal pain will obtain a CT abdomen pelvis with contrast -Continue with Bair hugger per protocol -Check procalcitonin level and was 1.01 -Continue to monitor cultures and temperature curve  Lactic Acidosis, trending down -Patient's lactic acid level was greater than 112 and then repeat was 8.8 this afternoon -Continue with IV fluid hydration and blood transfusion and repeat lactic acid every 3 hours  High anion gap metabolic acidosis -Severely deranged values and has a anion gap of 33 and a bicarbonate level of 11 -Continue with IV fluid hydration and continue to monitor lactic acid level -Repeat CMP is pending this afternoon  Acute respiratory failure with hypoxia in the setting of sepsis -Patient was placed on 2 L of normal saline will continue -Continuous pulse oximetry and maintain O2 saturations greater than 92%  -Continue with supplemental oxygen via a nasal cannula and wean O2 as tolerated -COVID-19 Testing done and SARS-CoV-2 Negative  -Repeat chest x-ray in the a.m.  Symptomatic anemia with concern for upper GI bleed with coffee-ground emesis -We will make the patient n.p.o. currently and have GI evaluate -Patient was placed on a  Protonix drip and will continue to monitor for signs and symptoms of bleeding -FOBT was negative currently -Gastric occult was never sent on dark emesis; ? Related to Avastin or Liver Pathology and ? Esophageal Varices -We will type and screen and transfuse 2 units of PRBCs; Per anesthesiology they would not do EGD without 2 units transfused so patient got 1 unit already and will be ordered another -Per my discussion with gastroenterology patient is to go for an EGD and unfortunately this was canceled today by anesthesiology so GI is recommended placing the patient on octreotide drip and continue Protonix drip for now and will have an EGD done in the a.m. -We will repeat hemoglobin/hematocrit in this afternoon  Hypothyroidism with Elevated TSH -Patient's TSH was 29.022 -Free T4 was 0.97 -C/w Levothyroxine 50 mcg po Daily  -Repeat TFT's in 4-6 weeks as an outpatient   Paroxysmal Atrial fibrillation with RVR -Had rates in the 160s -We will start diltiazem drip and give a 10 mg IV bolus now -We will hold his Eliquis at this time given concern for GI bleeding -Cardiology was consulted for further evaluation recommendations -Likely A. fib with RVR is in the setting of anemia and sepsis -High-sensitivity troponin was 10.0  Stage IIIa hepatocellular carcinoma and Liver Disease Plan currently not a surgical candidate because of large tumor burden and not a candidate for liver transplant or treatment of Y 90 next-currently on Immunotherapy with Avastin followed by Dr. Burr Medico -We will notify Dr. Burr Medico of patient's admission -We will consult palliative care for goals of care discussion -Ammonia level was 41, AST was 69, and T Bili was 1.2 -Per my discussion with Dr. Burr Medico, Avastin can cause some GI bleeding and Thrombosis  Hx of CVA -Will hold Eliquis but will continue Atorvastatin  HLD -C/w Atorvastatin  Abdominal Pain -Check CT Abd/Pelvis w/ contrast  Left Hip Pain -Will obtain Left Hip  X-Ray   DVT prophylaxis: SCDs given concern for FIB  Code Status: FULL CODE Family Communication: Discussed with son at bedside  Disposition Plan: Remain Inpatient for further evaluation and workup  Consults called: PCCM Dr. Lamonte Sakai, cardiology Dr. Meda Coffee, GI Dr. Shirley Muscat, medical oncology Dr. Burr Medico, palliative care medicine Admission status: Inpatient SDU  Severity of Illness: The appropriate patient status for this patient is INPATIENT. Inpatient status is judged to be reasonable and necessary in order to provide the required intensity of service to ensure the patient's safety. The patient's presenting symptoms, physical exam findings, and initial radiographic and laboratory data in the context of their chronic comorbidities is felt to place them at high risk for further clinical deterioration. Furthermore, it is not anticipated that the patient will be medically stable for discharge from the hospital within 2 midnights of admission. The following factors support the patient status of inpatient.   " The patient's presenting symptoms include Fatigue and SOB. " The worrisome physical exam findings include Dry MM and Irregularly Irregular HR that is tachycardic . " The initial radiographic and laboratory data are worrisome because of of High Anion Gap metabolic acidosis, Lactic Acidosis. " The chronic co-morbidities are listed as above.  * I certify that at the point of admission it is my clinical judgment that the patient will require inpatient hospital care spanning beyond 2 midnights from the point of admission due to high intensity of service, high risk for further deterioration and high frequency of surveillance required.*  The patient is critically ill with multiple organ system failure and requires high complexity decision making for assessment and support, frequent evaluation and titration of therapies and application of advanced monitoring technologies and extensive interpretation of multiple  databases  CRITICAL CARE TIME SPENT: 51 Non-consecutive Minutes devoted to patient care services described in this note including but not limited to reviewing chart, seeing patient, coordinating care, updating family, and discussing with consultants  Kerney Elbe, D.O. [facility] PAGER is on [place]  If 7PM-7AM, please contact night-coverage [email or web address] Password [email or web address]  11/02/2018, 4:18 PM

## 2018-11-02 NOTE — ED Notes (Signed)
Dr. Sherry Ruffing aware pts heart rate 150's and rectal temp recheck is 95.5. Continue using bear hugger.

## 2018-11-02 NOTE — Consult Note (Signed)
Referring Provider:  [facility] Primary Care Physician:  Hulan Fess, MD Primary Gastroenterologist:  Dr. Alessandra Bevels  Reason for Consultation: Anemia, coffee-ground emesis  HPI: Antonio Manning is a 78 y.o. male with past medical history of stage IIIa hepatocellular carcinoma diagnosed in March 2020, not a surgical candidate because of large tumor burden, not a candidate for liver transplant or treatment with Y 90,  on chemotherapy and followed by Dr. Burr Medico presented to the hospital with weakness and diarrhea.  He was found to have a drop in hemoglobin to 6.6 from hemoglobin of 10.7,  Eight  days ago.  He also had few episodes of coffee-ground emesis.  He had large bowel movement yesterday but it did not check the color of stool.  Complaining of generalized abdominal discomfort.  Complaining of intermittent diarrhea.  Denies NSAID use.  Takes tramadol for pain.  Past Medical History:  Diagnosis Date  . Arthritis 06/03/2011   osteoarthritis,lt. hip, and neck area  . Cancer ([place]) 06-03-11   skin cancer lesions-multiple  . Cataract 06-03-11   right  . Cholesterol serum elevated 06-03-2011   tx. Lipitor  . Expressive aphasia 10/2015  . Hearing loss Jun 03, 2011   wears hearing aids bilaterally  . Kidney calculi June 03, 2011   past hx. x1-passed on own, mild prostate issues-no meds  . PONV (postoperative nausea and vomiting) 06-03-11   with cataract surgery  . Stroke [facility]) 10/2015    Past Surgical History:  Procedure Laterality Date  . BACK SURGERY  2011/06/03   Lumbar disc surgery-no hardware  . CATARACT EXTRACTION W/ INTRAOCULAR LENS IMPLANT  Jun 03, 2011   left eye  . EP IMPLANTABLE DEVICE N/A 10/29/2015   Procedure: Loop Recorder Insertion;  Surgeon: Evans Lance, MD;  Location: [place] [facility];  Service: Cardiovascular;  Laterality: N/A;  . EYE SURGERY     eye lid lift   . I&D EXTREMITY Right 01/17/2016   Procedure: IRRIGATION AND DEBRIDEMENT LEG LACERATION;  Surgeon: Vickey Huger, MD;  Location: [place];  Service:  Orthopedics;  Laterality: Right;  . TEE WITHOUT CARDIOVERSION N/A 10/29/2015   Procedure: TRANSESOPHAGEAL ECHOCARDIOGRAM (TEE);  Surgeon: Larey Dresser, MD;  Location: [place];  Service: Cardiovascular;  Laterality: N/A;  . TOTAL HIP ARTHROPLASTY  05/20/2011   Procedure: TOTAL HIP ARTHROPLASTY ANTERIOR APPROACH;  Surgeon: Mauri Pole, MD;  Location: [facility];  Service: Orthopedics;  Laterality: Left;    Prior to Admission medications   Medication Sig Start Date End Date Taking? Authorizing Provider  amLODipine (NORVASC) 2.5 MG tablet Take 1 tablet (2.5 mg total) by mouth daily. 10/25/18  Yes Alla Feeling, NP  atorvastatin (LIPITOR) 40 MG tablet Take 40 mg by mouth daily.  03/24/18  Yes [provider]  calcium carbonate (TUMS - DOSED IN MG ELEMENTAL CALCIUM) 500 MG chewable tablet Chew 1 tablet by mouth 3 (three) times daily as needed for indigestion or heartburn.   Yes [provider]  Coenzyme Q10 (COQ10) 100 MG CAPS Take 100 mg by mouth daily.   Yes [provider]  ELIQUIS 5 MG TABS tablet TAKE 1 TABLET BY MOUTH TWICE A DAY 12/27/17  Yes Evans Lance, MD  levothyroxine (SYNTHROID) 50 MCG tablet Take 50 mcg by mouth daily before breakfast.   Yes [provider]  mirtazapine (REMERON) 7.5 MG tablet TAKE 1 TABLET BY MOUTH AT BEDTIME. 10/30/18  Yes [name], MD  Multiple Vitamin (MULTIVITAMIN) tablet Take 1 tablet by mouth daily.   Yes [provider]  ondansetron (ZOFRAN) 8 MG tablet Take 1 tablet (8 mg total) by mouth 2 (two) times daily as needed (Nausea or vomiting). 08/09/18  Yes [name], MD  prochlorperazine (COMPAZINE) 10 MG tablet TAKE 1 TABLET (10 MG TOTAL) BY MOUTH EVERY 6 (SIX) HOURS AS NEEDED FOR NAUSEA OR VOMITING. 10/31/18  Yes [name], MD  vitamin C (ASCORBIC ACID) 250 MG tablet Take 250 mg by mouth daily.   Yes [provider]    Scheduled Meds: Continuous Infusions: . sodium chloride    . ceFEPime (MAXIPIME) IV     . pantoprazole (PROTONIX) IVPB    . pantoprozole (PROTONIX) infusion    . vancomycin 1,250 mg (11/02/18 1229)  . [START ON 11/03/2018] vancomycin     PRN Meds:.  Allergies as of 11/02/2018 - Review Complete 11/02/2018  Allergen Reaction Noted  . Codeine Nausea And Vomiting 05/05/2011    Family History  Problem Relation Age of Onset  . Heart Problems Mother   . Cancer Father   . Cancer Brother   . Stroke Brother   . Heart Problems Brother   . Cancer Brother   . Heart Problems Brother   . Heart Problems Sister   . Hyperlipidemia Sister   . Birth defects Child   . Other Child        one kidney    Social History   Socioeconomic History  . Marital status: Married    Spouse name: Not on file  . Number of children: 3  . Years of education: 69  . Highest education level: Not on file  Occupational History  . 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 Financial resource strain: Not on file  . Food insecurity    Worry: Not on file    Inability: Not on file  . Transportation needs    Medical: Not on file    Non-medical: Not on file  Tobacco Use  . Smoking status: Former Smoker    Years: 25.00    Types: Cigarettes    Quit date: 05/12/1988    Years since quitting: 30.4  . Smokeless tobacco: Never Used  Substance and Sexual Activity  . Alcohol use: No    Comment: he used to drink alcohol lightly and quit 50 years   . Drug use: No  . Sexual activity: Yes  Lifestyle  . Physical activity    Days per week: Not on file    Minutes per session: Not on file  . Stress: Not on file  Relationships  . Social Herbalist on phone: Not on file    Gets together: Not on file    Attends religious service: Not on file    Active member of club or organization: Not on file    Attends meetings of clubs or organizations: Not on file    Relationship status: Not on file  . Intimate partner violence    Fear of current or ex partner: Not on file    Emotionally  abused: Not on file    Physically abused: Not on file    Forced sexual activity: Not on file  Other Topics Concern  . Not on file  Social History Narrative  . Not on file    Review of Systems: Review of Systems  Constitutional: Positive for malaise/fatigue. Negative for chills and fever.  HENT: Positive for hearing loss. Negative for tinnitus.   Eyes: Negative for blurred vision and double vision.  Respiratory:  Positive for cough. Negative for hemoptysis.   Cardiovascular: Negative for chest pain and palpitations.  Gastrointestinal: Positive for abdominal pain, diarrhea, heartburn, nausea and vomiting.  Genitourinary: Negative for dysuria and urgency.  Musculoskeletal: Positive for joint pain and myalgias.  Skin: Negative for itching and rash.  Neurological: Negative for seizures and loss of consciousness.  Endo/Heme/Allergies: Does not bruise/bleed easily.  Psychiatric/Behavioral: Negative for hallucinations and suicidal ideas.    Physical Exam: Vital signs: Vitals:   11/02/18 1200 11/02/18 1221  BP: (!) 128/94 113/85  Pulse: (!) 154 (!) 153  Resp: (!) 23 17  Temp:  (!) 95.5 F (35.3 C)  SpO2: 94%      Physical Exam  Constitutional: He is oriented to person, place, and time. He appears well-developed and well-nourished. No distress.  HENT:  Head: Atraumatic.  Mouth/Throat: Oropharynx is clear and moist. No oropharyngeal exudate.  Eyes: EOM are normal. No scleral icterus.  Neck: Normal range of motion. Neck supple.  Cardiovascular: Normal rate and regular rhythm.  Murmur heard. Pulmonary/Chest: Effort normal and breath sounds normal. No respiratory distress.  Anterior exam only  Abdominal: Bowel sounds are normal. He exhibits distension. There is no rebound and no guarding.  Right-sided fullness noted, abdomen is mild distended with generalized discomfort on palpation.  Musculoskeletal: Normal range of motion.        General: No edema.  Neurological: He is alert and  oriented to person, place, and time.  Skin: Skin is warm. No erythema.  Psychiatric: He has a normal mood and affect. Judgment and thought content normal.  Vitals reviewed.   GI:  Lab Results: Recent Labs    11/02/18 0827  WBC 12.0*  HGB 6.6*  HCT 23.2*  PLT 323   BMET Recent Labs    11/02/18 0827  NA 140  K 3.6  CL 96*  CO2 11*  GLUCOSE 143*  BUN 23  CREATININE 1.14  CALCIUM 9.3   LFT Recent Labs    11/02/18 0827  PROT 6.8  ALBUMIN 4.2  AST 69*  ALT 21  ALKPHOS 120  BILITOT 1.2  BILIDIR 0.4*  IBILI 0.8   PT/INR No results for input(s): LABPROT, INR in the last 72 hours.   Studies/Results: Dg Chest Portable 1 View  Result Date: 11/02/2018 CLINICAL DATA:  Diarrhea, weakness, history of liver cancer EXAM: PORTABLE CHEST 1 VIEW COMPARISON:  08/11/2018 FINDINGS: Trace right pleural effusion. Mild right basilar atelectasis. No pneumothorax. Stable cardiomediastinal silhouette. No aggressive osseous lesion. IMPRESSION: Trace right pleural effusion with atelectasis. Electronically Signed   By: Kathreen Devoid   On: 11/02/2018 10:17    Impression/Plan: -Coffee-ground emesis with 4 g drop in hemoglobin compared to last week.  CT scan of abdomen on October 16, 2018 showed large tumor burden with significant mass-effect of left lobe of liver upon the extrahepatic portal vein without any definite evidence of thrombosis. -Acute blood loss anemia -Hepatocellular carcinoma. -Intermittent diarrhea.  Recommendations --------------------------- -Possibility of variceal bleeding from portal hypertension from compression of portal vein secondary to left liver mass cannot be ruled out. -Plan for EGD today.  Continue PPI and antibiotics for now.  Consider adding octreotide depending on endoscopy finding. - Check INR  -Currently receiving blood transfusion. -Monitor H&H.  Transfuse to keep hemoglobin around 8.  Avoid over transfusion. -Treatment options and procedure  discussed  with patient as well as patient's son at bedside.  They verbalized understanding.  Risks (bleeding, infection, bowel perforation that could require surgery, sedation-related changes in  cardiopulmonary systems), benefits (identification and possible treatment of source of symptoms, exclusion of certain causes of symptoms), and alternatives (watchful waiting, radiographic imaging studies, empiric medical treatment)  were explained to patient and family in detail and patient wishes to proceed.    LOS: 0 days   Otis Brace  MD, FACP 11/02/2018, 12:46 PM  Contact #  [phone]

## 2018-11-02 NOTE — Progress Notes (Signed)
-  Anesthesia physician requesting completion of 2 units of PRBC transfusion prior to starting sedation.  We will not able to get that completed this afternoon.  -I will start octreotide drip.  Keep n.p.o. for now.  I will reschedule  EGD for  tomorrow morning.  This was discussed with endoscopy staff, patient as well as patient's son.  Otis Brace MD, [place] 11/02/2018, 1:59 PM  Contact #  [phone]

## 2018-11-02 NOTE — ED Notes (Signed)
MD notified that only one set of blood cultures able to be obtained.

## 2018-11-02 NOTE — Progress Notes (Signed)
CRITICAL VALUE ALERT  Critical Value:  Lactic Acid 8.8  Date & Time Notied:  11/02/2018 1550  Provider Notified: Raiford Noble  No new orders received, trending down.

## 2018-11-02 NOTE — ED Notes (Signed)
Family at bedside at the discretion of the charge rn

## 2018-11-02 NOTE — Anesthesia Preprocedure Evaluation (Deleted)
Anesthesia Evaluation  Patient identified by MRN, date of birth, ID band Patient awake    Reviewed: Allergy & Precautions, NPO status , Patient's Chart, lab work & pertinent test results  History of Anesthesia Complications (+) PONV and history of anesthetic complications  Airway Mallampati: II  TM Distance: >3 FB Neck ROM: Full    Dental no notable dental hx. (+) Edentulous Upper, Partial Lower, Dental Advisory Given   Pulmonary neg pulmonary ROS, former smoker,    Pulmonary exam normal breath sounds clear to auscultation       Cardiovascular + Peripheral Vascular Disease  Normal cardiovascular exam+ dysrhythmias Atrial Fibrillation  Rhythm:Regular Rate:Normal  Left ventricle: The cavity size was normal. Wall thickness was   increased in a pattern of mild LVH. The estimated ejection   fraction was 55%. Wall motion was normal; there were no regional   wall motion abnormalities. - Aortic valve: There was no stenosis. There was trivial   regurgitation. - Aorta: The aortic root was dilated to 5.0 cm and the ascending   aorta measured up to 4.6 cm. - Mitral valve: There was mild regurgitation. - Left atrium: No evidence of thrombus in the atrial cavity or   appendage. - Right ventricle: The cavity size was normal. There was no   evidence of concentric hypertrophy. - Right atrium: No evidence of thrombus in the atrial cavity or   appendage. - Atrial septum: PFO was noted by bubble study. 10/2015   Neuro/Psych CVA, Residual Symptoms negative psych ROS   GI/Hepatic negative GI ROS, Neg liver ROS,   Endo/Other  negative endocrine ROS  Renal/GU Renal disease     Musculoskeletal  (+) Arthritis ,   Abdominal   Peds  Hematology negative hematology ROS (+)   Anesthesia Other Findings   Reproductive/Obstetrics negative OB ROS                            Anesthesia Physical  Anesthesia  Plan  ASA: III  Anesthesia Plan: MAC   Post-op Pain Management:    Induction: Intravenous  PONV Risk Score and Plan:   Airway Management Planned:   Additional Equipment:   Intra-op Plan:   Post-operative Plan: Extubation in OR  Informed Consent: I have reviewed the patients History and Physical, chart, labs and discussed the procedure including the risks, benefits and alternatives for the proposed anesthesia with the patient or authorized representative who has indicated his/her understanding and acceptance.     Dental advisory given  Plan Discussed with: CRNA, Surgeon and Anesthesiologist  Anesthesia Plan Comments: (Pt. Tachycardic to the 150s prior to PRBCs starting. Hgb 6.6, down from 10.7 on 6/25. Has only had about half a unit of blood. I spoke with Dr. Rosalie Gums about the plan and said I felt that he should receive at least 2 units of PRBCs prior to the procedure. I said that given his troponin bump, apparently subacute blood loss, and esophageal varices, he should be better optimized prior to induction of anesthesia. )      Anesthesia Quick Evaluation

## 2018-11-02 NOTE — Progress Notes (Signed)
PHARMACIST - PHYSICIAN ORDER COMMUNICATION  CONCERNING: P&T Medication Policy on Herbal Medications  DESCRIPTION:  This patient's order for:  CoQ-10  has been noted.  This product(s) is classified as an "herbal" or natural product. Due to a lack of definitive safety studies or FDA approval, nonstandard manufacturing practices, plus the potential risk of unknown drug-drug interactions while on inpatient medications, the Pharmacy and Therapeutics Committee does not permit the use of "herbal" or natural products of this type within [facility].   ACTION TAKEN: The pharmacy department is unable to verify this order at this time. Please reevaluate patient's clinical condition at discharge and address if the herbal or natural product(s) should be resumed at that time.  Lenis Noon, PharmD 11/02/18 4:00 PM

## 2018-11-02 NOTE — Progress Notes (Signed)
A consult was received from an ED physician for vancomycin and cefepime per pharmacy dosing.  The patient's profile has been reviewed for ht/wt/allergies/indication/available labs.     A one time order has been placed for cefepime 2 g IV once and vancomycin 1250 mg IV once.    Further antibiotics/pharmacy consults should be ordered by admitting physician if indicated.                       Thank you, Lenis Noon, PharmD 11/02/2018  10:11 AM

## 2018-11-02 NOTE — ED Notes (Signed)
Bed: WA06 Expected date:  Expected time:  Means of arrival:  Comments: EMS 78yo weakness, diarrhea, [place] pt

## 2018-11-02 NOTE — Progress Notes (Signed)
Antonio Manning   DOB:25-Jun-1940   PP#:[id number]   SAY#:[id number]  Oncology follow up  Subjective: Pt is well known to me, under my care for his unresectable HCC. He presented with worsening fatigue, dyspnea on exertion, left hip pain, nausea vomiting with coffee-ground emesis to the ED today, and was admitted for sepsis, GI bleeding and anemia. He is in ICU, feels quite fatigued, but awake and alert, no neurological concerns.    Objective:  Vitals:   11/02/18 2030 11/02/18 2042  BP: (!) 162/85 (!) 159/91  Pulse: (!) 54 70  Resp: 18 15  Temp:  (!) 97.4 F (36.3 C)  SpO2: 100% 100%    Body mass index is 20.15 kg/m.  Intake/Output Summary (Last 24 hours) at 11/02/2018 2059 Last data filed at 11/02/2018 1956 Gross per 24 hour  Intake 904.64 ml  Output 103 ml  Net 801.64 ml     Sclerae unicteric  Oropharynx clear  No peripheral adenopathy  Lungs clear -- no rales or rhonchi  Heart regular rate and rhythm  Abdomen distended, mild tenderness  Neuro nonfocal    CBG (last 3)  Recent Labs    11/02/18 0828  GLUCAP 119*     Labs:  Urine Studies No results for input(s): UHGB, CRYS in the last 72 hours.  Invalid input(s): UACOL, UAPR, USPG, UPH, UTP, UGL, UKET, UBIL, UNIT, UROB, ULEU, UEPI, UWBC, URBC, UBAC, CAST, UCOM, BILUA  Basic Metabolic Panel: Recent Labs  Lab 11/02/18 0827 11/02/18 1527  NA 140 141  K 3.6 4.1  CL 96* 102  CO2 11* 21*  GLUCOSE 143* 120*  BUN 23 23  CREATININE 1.14 0.88  CALCIUM 9.3 8.6*  MG 2.0 1.5*   GFR Estimated Creatinine Clearance: 63.8 mL/min (by C-G formula based on SCr of 0.88 mg/dL). Liver Function Tests: Recent Labs  Lab 11/02/18 0827 11/02/18 1527  AST 69* 239*  ALT 21 82*  ALKPHOS 120 131*  BILITOT 1.2 1.2  PROT 6.8 6.3*  ALBUMIN 4.2 4.1   Recent Labs  Lab 11/02/18 0827  LIPASE 29   Recent Labs  Lab 11/02/18 0914  AMMONIA 41*   Coagulation profile Recent Labs  Lab 11/02/18 0830  INR 1.7*    CBC: Recent Labs   Lab 11/02/18 0827 11/02/18 1614  WBC 12.0* 10.1  NEUTROABS  --  8.4*  HGB 6.6* 7.4*  HCT 23.2* 23.7*  MCV 106.9* 100.0  PLT 323 249   Cardiac Enzymes: Recent Labs  Lab 11/02/18 0827  CKTOTAL 24*   BNP: Invalid input(s): POCBNP CBG: Recent Labs  Lab 11/02/18 0828  GLUCAP 119*   D-Dimer No results for input(s): DDIMER in the last 72 hours. Hgb A1c No results for input(s): HGBA1C in the last 72 hours. Lipid Profile No results for input(s): CHOL, HDL, LDLCALC, TRIG, CHOLHDL, LDLDIRECT in the last 72 hours. Thyroid function studies Recent Labs    11/02/18 0914  TSH 29.022*   Anemia work up No results for input(s): VITAMINB12, FOLATE, FERRITIN, TIBC, IRON, RETICCTPCT in the last 72 hours. Microbiology Recent Results (from the past 240 hour(s))  SARS Coronavirus 2 (CEPHEID- Performed in [place] hospital lab), Hosp Order     Status: None   Collection Time: 11/02/18  8:27 AM   Specimen: Nasopharyngeal Swab  Result Value Ref Range Status   SARS Coronavirus 2 NEGATIVE NEGATIVE Final    Comment: (NOTE) If result is NEGATIVE SARS-CoV-2 target nucleic acids are NOT DETECTED. The SARS-CoV-2 RNA is generally detectable in  upper and lower  respiratory specimens during the acute phase of infection. The lowest  concentration of SARS-CoV-2 viral copies this assay can detect is 250  copies / mL. A negative result does not preclude SARS-CoV-2 infection  and should not be used as the sole basis for treatment or other  patient management decisions.  A negative result may occur with  improper specimen collection / handling, submission of specimen other  than nasopharyngeal swab, presence of viral mutation(s) within the  areas targeted by this assay, and inadequate number of viral copies  (<250 copies / mL). A negative result must be combined with clinical  observations, patient history, and epidemiological information. If result is POSITIVE SARS-CoV-2 target nucleic acids are  DETECTED. The SARS-CoV-2 RNA is generally detectable in upper and lower  respiratory specimens dur ing the acute phase of infection.  Positive  results are indicative of active infection with SARS-CoV-2.  Clinical  correlation with patient history and other diagnostic information is  necessary to determine patient infection status.  Positive results do  not rule out bacterial infection or co-infection with other viruses. If result is PRESUMPTIVE POSTIVE SARS-CoV-2 nucleic acids MAY BE PRESENT.   A presumptive positive result was obtained on the submitted specimen  and confirmed on repeat testing.  While 2019 novel coronavirus  (SARS-CoV-2) nucleic acids may be present in the submitted sample  additional confirmatory testing may be necessary for epidemiological  and / or clinical management purposes  to differentiate between  SARS-CoV-2 and other Sarbecovirus currently known to infect humans.  If clinically indicated additional testing with an alternate test  methodology [phone]) is advised. The SARS-CoV-2 RNA is generally  detectable in upper and lower respiratory sp ecimens during the acute  phase of infection. The expected result is Negative. Fact Sheet for Patients:  StrictlyIdeas.no Fact Sheet for Healthcare Providers: BankingDealers.co.za This test is not yet approved or cleared by the [place] FDA and has been authorized for detection and/or diagnosis of SARS-CoV-2 by FDA under an Emergency Use Authorization (EUA).  This EUA will remain in effect (meaning this test can be used) for the duration of the COVID-19 declaration under Section 564(b)(1) of the Act, 21 U.S.C. section 360bbb-3(b)(1), unless the authorization is terminated or revoked sooner. Performed at [facility], [place] [address] [place], [place] [address]       Studies:  Ct Abdomen Pelvis W Contrast  Result Date: 11/02/2018 CLINICAL DATA:   Vomiting.  Abdominal pain. EXAM: CT ABDOMEN AND PELVIS WITH CONTRAST TECHNIQUE: Multidetector CT imaging of the abdomen and pelvis was performed using the standard protocol following bolus administration of intravenous contrast. CONTRAST:  149m OMNIPAQUE IOHEXOL 300 MG/ML  SOLN COMPARISON:  CT scan of October 16, 2018. FINDINGS: Lower chest: Small right pleural effusion is noted with adjacent right lower lobe atelectasis or pneumonia. Hepatobiliary: Innumerable malignant or metastatic lesions are seen involving both hepatic lobes, but most predominantly the left hepatic lobe and anterior segment of right hepatic lobe. Index lesion is noted anterior portion of right hepatic lobe that measures 8.7 x 8.8 cm, which is not significantly changed compared to prior exam. No biliary dilatation or cholelithiasis is noted. Pancreas: Unremarkable. No pancreatic ductal dilatation or surrounding inflammatory changes. Spleen: Normal in size without focal abnormality. Adrenals/Urinary Tract: Adrenal glands are unremarkable. Kidneys are normal, without renal calculi, focal lesion, or hydronephrosis. Bladder is unremarkable. Stomach/Bowel: Stomach is within normal limits. Appendix appears normal. No evidence of bowel wall thickening, distention, or inflammatory  changes. Vascular/Lymphatic: Aortic atherosclerosis. 16 mm aortocaval lymph node is noted which is not significantly changed compared to prior exam. Reproductive: Prostate is unremarkable. Other: Moderate ascites is noted.  No hernia is noted. Musculoskeletal: No acute or significant osseous findings. IMPRESSION: Small right pleural effusion is noted with adjacent right lower lobe atelectasis or pneumonia. Stable diffuse hepatic lesions are noted consistent with metastatic disease or malignancy. Stable 16 mm aortocaval lymph node. Moderate ascites. Aortic Atherosclerosis (ICD10-I70.0). Electronically Signed   By: Marijo Conception M.D.   On: 11/02/2018 19:32   Dg Chest Portable  1 View  Result Date: 11/02/2018 CLINICAL DATA:  Diarrhea, weakness, history of liver cancer EXAM: PORTABLE CHEST 1 VIEW COMPARISON:  08/11/2018 FINDINGS: Trace right pleural effusion. Mild right basilar atelectasis. No pneumothorax. Stable cardiomediastinal silhouette. No aggressive osseous lesion. IMPRESSION: Trace right pleural effusion with atelectasis. Electronically Signed   By: Kathreen Devoid   On: 11/02/2018 10:17   Dg Hip Unilat With Pelvis 1v Left  Result Date: 11/02/2018 CLINICAL DATA:  Left hip replacement EXAM: DG HIP (WITH OR WITHOUT PELVIS) 1V*L* COMPARISON:  No comparison. FINDINGS: Bones are diffusely demineralized. Patient is status post left total hip arthroplasty. Degenerative changes noted in the right hip. IMPRESSION: No acute findings. Electronically Signed   By: Misty Stanley M.D.   On: 11/02/2018 18:51    Assessment: 78 y.o. male with stage III hepatocellular carcinoma  1. Sepsis with severe lactic acidosis, unknown source of infection  2.  Acute respiratory failure with hypoxia, secondary to #1  3.  Symptomatic anemia from upper GI bleeding  4. Stage III hepatocellular carcinoma, currently on atezoliaumab (immunotherapy) and Avastin (EGFR inhibitor), last dose 6/24 5. AF with RVR 6. History of CVA 7. Left hip pain, history of left hip replacement    Plan:  -I agree with broad spectrum antibiotics and ID workup -he is getting blood transfusion, please keep Hg>8.0 -GI on board, plan to have EGD tomorrow  -Avastin can cause bleeding. Pt has no known history of liver cirrhosis, but has large tumor burden in liver, possible cancer related portal hypertension, varices bleeding is possible, he has also been on eliquis for AF, which has been held now  -his recent CT scan showed mild disease progression, due to lack of other cancer treatment options, and possibility of pseudoprogression from immunotherapy, we decided to continue current regimen. Will certainly hold it for now,  especially Avastin.  -from his [facility] standpoint, he has very poor prognosis, and does not have many other treatment options, due to his advanced age and limited PS. If he deteriorates, we discussed goal of care and palliative care alone.  -I will be out of office until next Wednesday 7/8. Please call my on-call partner if needed.  -I appreciate his care from the hospitalist, GI and cardiology service.    [name], MD 11/02/2018

## 2018-11-02 NOTE — Consult Note (Addendum)
Cardiology Consultation:   Patient ID: Antonio Manning MRN: [id number]; DOB: May 10, 1940  Admit date: 11/02/2018 Date of Consult: 11/02/2018  Primary Care Provider: Hulan Fess, Manning Primary Cardiologist: Antonio Klein, Manning  Primary Electrophysiologist:  None    Patient Profile:   Antonio Manning is a 78 y.o. male with a hx of cryptogenic stroke and subsequent diagnosis of atrial fibrillation with low burden by ILR (Antonio Manning), ETT negative for ischemia (2019), and chronic diastolic heart failure  who is being seen today for the evaluation of Afib RVR in the setting of GI bleed at the request of Dr. Alfredia Manning.  History of Present Illness:   [name] underwent ETT and echocardiogram in 2019 for exertional fatigue and dyspnea. ETT was negative for ischemia and echocardiogram with normal EF and grade 1 DD, mild mitral valve prolapse with mild regurgitation. Loop recorder with low Afib burden (0.1% at clinic visit 03/2018). Afib and brief atrial flutter on ILR, anticoagulated with eliquis 5 mg BID for CHADSVasc 4 (age 79, CVA 2). He was last seen in clinic by Antonio Manning for preoperative clearance for colonoscopy (07/06/18). He continued to complain of fatigue with exertion, but had recently had CT with multiple liver lesion and renal lesions suspicious for cancer. He was cleared for colonoscopy, holding eliquis without bridging.  He was recently diagnosed with stage IIIa hepatocellular carcinoma with high tumor burden on liver and renal lesions. He is on chemotherapy followed by Antonio Manning. He unfortunately presented to [facility] with coffee-ground emesis, weakness, and diarrhea found to have a 4 gram drop in his Hb.  GI was consulted and will transfuse patient with plans for EDG tomorrow. Octreotide in the meantime. Suspect portal hypertension from liver lesions causing variceal bleeding. On arrival, he was tachycardic with suspected atrial fibrillation. Cardiology was consulted for rate control in anticipation  for EDG.   Lactic acid > 11 x 2   Heart Pathway Score:     Past Medical History:  Diagnosis Date  . Arthritis 2011-05-22   osteoarthritis,lt. hip, and neck area  . Cancer ([place]) 2011-05-22   skin cancer lesions-multiple  . Cataract 05/22/11   right  . Cholesterol serum elevated May 22, 2011   tx. Lipitor  . Expressive aphasia 10/2015  . Hearing loss May 22, 2011   wears hearing aids bilaterally  . Kidney calculi 05/22/2011   past hx. x1-passed on own, mild prostate issues-no meds  . PONV (postoperative nausea and vomiting) 05-22-11   with cataract surgery  . Stroke [facility]) 10/2015    Past Surgical History:  Procedure Laterality Date  . BACK SURGERY  05/22/11   Lumbar disc surgery-no hardware  . CATARACT EXTRACTION W/ INTRAOCULAR LENS IMPLANT  May 22, 2011   left eye  . EP IMPLANTABLE DEVICE N/A 10/29/2015   Procedure: Loop Recorder Insertion;  Surgeon: Antonio Manning;  Location: [facility];  Service: Cardiovascular;  Laterality: N/A;  . EYE SURGERY     eye lid lift   . I&D EXTREMITY Right 01/17/2016   Procedure: IRRIGATION AND DEBRIDEMENT LEG LACERATION;  Surgeon: Antonio Huger, Manning;  Location: [facility];  Service: Orthopedics;  Laterality: Right;  . TEE WITHOUT CARDIOVERSION N/A 10/29/2015   Procedure: TRANSESOPHAGEAL ECHOCARDIOGRAM (TEE);  Surgeon: Antonio Manning;  Location: [place] [place];  Service: Cardiovascular;  Laterality: N/A;  . TOTAL HIP ARTHROPLASTY  05/20/2011   Procedure: TOTAL HIP ARTHROPLASTY ANTERIOR APPROACH;  Surgeon: Antonio Manning;  Location: [facility];  Service: Orthopedics;  Laterality: Left;  Home Medications:  Prior to Admission medications   Medication Sig Start Date End Date Taking? Authorizing Provider  amLODipine (NORVASC) 2.5 MG tablet Take 1 tablet (2.5 mg total) by mouth daily. 10/25/18  Yes Antonio Feeling, NP  atorvastatin (LIPITOR) 40 MG tablet Take 40 mg by mouth daily.  03/24/18  Yes Provider, Historical, Manning  calcium carbonate (TUMS - DOSED IN MG ELEMENTAL  CALCIUM) 500 MG chewable tablet Chew 1 tablet by mouth 3 (three) times daily as needed for indigestion or heartburn.   Yes Provider, Historical, Manning  Coenzyme Q10 (COQ10) 100 MG CAPS Take 100 mg by mouth daily.   Yes Provider, Historical, Manning  ELIQUIS 5 MG TABS tablet TAKE 1 TABLET BY MOUTH TWICE A DAY 12/27/17  Yes Antonio Manning  levothyroxine (SYNTHROID) 50 MCG tablet Take 50 mcg by mouth daily before breakfast.   Yes Provider, Historical, Manning  mirtazapine (REMERON) 7.5 MG tablet TAKE 1 TABLET BY MOUTH AT BEDTIME. 10/30/18  Yes Antonio Merle, Manning  Multiple Vitamin (MULTIVITAMIN) tablet Take 1 tablet by mouth daily.   Yes Provider, Historical, Manning  ondansetron (ZOFRAN) 8 MG tablet Take 1 tablet (8 mg total) by mouth 2 (two) times daily as needed (Nausea or vomiting). 08/09/18  Yes Antonio Merle, Manning  prochlorperazine (COMPAZINE) 10 MG tablet TAKE 1 TABLET (10 MG TOTAL) BY MOUTH EVERY 6 (SIX) HOURS AS NEEDED FOR NAUSEA OR VOMITING. 10/31/18  Yes Antonio Merle, Manning  vitamin C (ASCORBIC ACID) 250 MG tablet Take 250 mg by mouth daily.   Yes Provider, Historical, Manning    Inpatient Medications: Scheduled Meds: . sodium chloride   Intravenous Once  . octreotide  50 mcg Intravenous Once   Continuous Infusions: . sodium chloride    . sodium chloride    . [MAR Hold] ceFEPime (MAXIPIME) IV    . octreotide  (SANDOSTATIN)    IV infusion    . [MAR Hold] pantoprazole (PROTONIX) IVPB    . pantoprozole (PROTONIX) infusion    . [MAR Hold] vancomycin     PRN Meds:   Allergies:    Allergies  Allergen Reactions  . Codeine Nausea And Vomiting    Social History:   Social History   Socioeconomic History  . Marital status: Married    Spouse name: Not on file  . Number of children: 3  . Years of education: 18  . Highest education level: Not on file  Occupational History  . 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 Financial resource strain: Not on file  . Food insecurity    Worry: Not on file     Inability: Not on file  . Transportation needs    Medical: Not on file    Non-medical: Not on file  Tobacco Use  . Smoking status: Former Smoker    Years: 25.00    Types: Cigarettes    Quit date: 05/12/1988    Years since quitting: 30.4  . Smokeless tobacco: Never Used  Substance and Sexual Activity  . Alcohol use: No    Comment: he used to drink alcohol lightly and quit 50 years   . Drug use: No  . Sexual activity: Yes  Lifestyle  . Physical activity    Days per week: Not on file    Minutes per session: Not on file  . Stress: Not on file  Relationships  . Social Herbalist on phone: Not on file    Gets together: Not on  file    Attends religious service: Not on file    Active member of club or organization: Not on file    Attends meetings of clubs or organizations: Not on file    Relationship status: Not on file  . Intimate partner violence    Fear of current or ex partner: Not on file    Emotionally abused: Not on file    Physically abused: Not on file    Forced sexual activity: Not on file  Other Topics Concern  . Not on file  Social History Narrative  . Not on file    Family History:    Family History  Problem Relation Age of Onset  . Heart Problems Mother   . Cancer Father   . Cancer Brother   . Stroke Brother   . Heart Problems Brother   . Cancer Brother   . Heart Problems Brother   . Heart Problems Sister   . Hyperlipidemia Sister   . Birth defects Child   . Other Child        one kidney     ROS:  Please see the history of present illness.   All other ROS reviewed and negative.     Physical Exam/Data:   Vitals:   11/02/18 1300 11/02/18 1306 11/02/18 1329 11/02/18 1344  BP: 117/89  140/81 (!) 122/95  Pulse: (!) 125  (!) 130 (!) 104  Resp: (!) 21  19 19   Temp:  97.8 F (36.6 C) (!) 97.5 F (36.4 C)   TempSrc:  Rectal Oral   SpO2: 93%     Weight:      Height:        Intake/Output Summary (Last 24 hours) at 11/02/2018 1410 Last  data filed at 11/02/2018 1043 Gross per 24 hour  Intake 500 ml  Output -  Net 500 ml   Last 3 Weights 11/02/2018 10/25/2018 10/04/2018  Weight (lbs) 148 lb 148 lb 3.2 oz 146 lb 3.2 oz  Weight (kg) 67.132 kg 67.223 kg 66.316 kg     Body mass index is 20.07 kg/m.  General:  Elderly male, recent vomiting HEENT: normal Neck: no JVD Vascular: No carotid bruits Cardiac:  Irregular rhythm, tachycardic rate Lungs:  clear to auscultation bilaterally, no wheezing, rhonchi or rales  Abd: soft, nontender, no hepatomegaly  Ext: no edema Musculoskeletal:  No deformities, BUE and BLE strength normal and equal Skin: warm and dry  Neuro:  CNs 2-12 intact, no focal abnormalities noted Psych:  Normal affect   EKG:  The EKG was personally reviewed and demonstrates:  Appears to be sinus tachycardia HR 103 with left axis deviation, further interpretation difficult with artifact Telemetry:  Telemetry was personally reviewed and demonstrates:  Appears to be sinus with bouts of sinus tachycardia and frequent ectopy  Relevant CV Studies:  Echo 11/02/18 - pending   Echo 12/27/17: Study Conclusions  - Left ventricle: The cavity size was normal. Systolic function was   normal. The estimated ejection fraction was in the range of 55%   to 60%. Wall motion was normal; there were no regional wall   motion abnormalities. Doppler parameters are consistent with   abnormal left ventricular relaxation (grade 1 diastolic   dysfunction). Doppler parameters are consistent with both   elevated ventricular end-diastolic filling pressure and elevated   left atrial filling pressure. - Aortic valve: There was trivial regurgitation. - Mitral valve: Mild, late systolicprolapse, involving the   posterior leaflet. There was mild regurgitation.  ETT 12/27/17:  Blood pressure demonstrated a normal response to exercise.  There was no ST segment deviation noted during stress.   ETT with normal exercise tolerance (6:10); no  chest pain; normal BP response; no ST changes; negative adequate ETT; Duke treadmill score 6.   Laboratory Data:  High Sensitivity Troponin:   Recent Labs  Lab 11/02/18 1020  TROPONINIHS 10.0     Cardiac EnzymesNo results for input(s): TROPONINI in the last 168 hours. No results for input(s): TROPIPOC in the last 168 hours.  Chemistry Recent Labs  Lab 11/02/18 0827  NA 140  K 3.6  CL 96*  CO2 11*  GLUCOSE 143*  BUN 23  CREATININE 1.14  CALCIUM 9.3  GFRNONAA >60  GFRAA >60  ANIONGAP 33*    Recent Labs  Lab 11/02/18 0827  PROT 6.8  ALBUMIN 4.2  AST 69*  ALT 21  ALKPHOS 120  BILITOT 1.2   Hematology Recent Labs  Lab 11/02/18 0827  WBC 12.0*  RBC 2.17*  HGB 6.6*  HCT 23.2*  MCV 106.9*  MCH 30.4  MCHC 28.4*  RDW 16.9*  PLT 323   BNPNo results for input(s): BNP, PROBNP in the last 168 hours.  DDimer No results for input(s): DDIMER in the last 168 hours.   Radiology/Studies:  Dg Chest Portable 1 View  Result Date: 11/02/2018 CLINICAL DATA:  Diarrhea, weakness, history of liver cancer EXAM: PORTABLE CHEST 1 VIEW COMPARISON:  08/11/2018 FINDINGS: Trace right pleural effusion. Mild right basilar atelectasis. No pneumothorax. Stable cardiomediastinal silhouette. No aggressive osseous lesion. IMPRESSION: Trace right pleural effusion with atelectasis. Electronically Signed   By: Kathreen Devoid   On: 11/02/2018 10:17    Assessment and Plan:   1. Atrial fibrillation with RVR vs atrial tachycardia with frequent ectopy 2. Hypertensive urgency 3. History of chronic anticoagulation for Afib - on eliquis  - telemetry with sinus rhythm/tachycardia and frequent ectopy - EKG as above - patient is asymptomatic and NPO - hypertensive with systolic in the 027O - agree with cardizem drip for rate and for hypertension - holding anticoagulation - home antihypertensives: norvasc 2.5 mg  4. Hyperlipidemia - hold crestor while NPO  5. Hypothyroid - restart synthroid when  taking orals    For questions or updates, please contact [place] Please consult [email or web address] for contact info under   Signed, Ledora Bottcher, PA  11/02/2018 2:10 PM   The patient was seen, examined and discussed with Minette Brine , PA-C and I agree with the above.   78 y.o. male with a hx of cryptogenic stroke and subsequent diagnosis of atrial fibrillation with low burden by ILR (Antonio Manning), ETT negative for ischemia (2019), and chronic diastolic heart failure  who is being seen today for the evaluation of Afib RVR in the setting of GI bleed. He is on chronic anticoagulation with eliquis 5 mg BID for CHADSVasc 4 (age 48, CVA 2). He was last seen in clinic by Antonio Manning [facility] for preoperative clearance for colonoscopy (07/06/18). He continued to complain of fatigue with exertion, but had recently had CT with multiple liver lesion and renal lesions suspicious for cancer. He was cleared for colonoscopy, holding eliquis without bridging. He was recently diagnosed with stage IIIa hepatocellular carcinoma with high tumor burden on liver and renal lesions. He is on chemotherapy followed by Antonio Manning. He unfortunately presented to [facility] with coffee-ground emesis, weakness, and diarrhea found to have a 4 gram drop in his Hb.  GI was consulted  and will transfuse patient with plans for EDG tomorrow. Octreotide in the meantime. Suspect portal hypertension from liver lesions causing variceal bleeding.   Physical exam shows pleasant gentleman coughing blood tinged sputum, he has no JVD elevation, S1,2 no significant murmur, lungs are CTA, LE have no edema, but appear cold.  Upon review of his telemetry he is in sinus rhythm to sinus tachycardia with frequent PACs and very short runs of atrial tachycardia, no evidence of atrial fibrillation. He is hypertensive.  I agree with cardizem drip for control of his hypertension, tachycardia and frequent ectopy as he is NPO. Hold anticoagulation until post EGD,  await further recommendation from GI.   Ena Dawley, Manning

## 2018-11-02 NOTE — Anesthesia Preprocedure Evaluation (Signed)
Anesthesia Evaluation    Reviewed: Allergy & Precautions, Patient's Chart, lab work & pertinent test results  History of Anesthesia Complications (+) PONV and history of anesthetic complications  Airway Mallampati: II  TM Distance: >3 FB Neck ROM: Full    Dental no notable dental hx. (+) Edentulous Upper, Partial Lower, Dental Advisory Given   Pulmonary neg pulmonary ROS, former smoker,    Pulmonary exam normal breath sounds clear to auscultation       Cardiovascular + Peripheral Vascular Disease  Normal cardiovascular exam+ dysrhythmias Atrial Fibrillation  Rhythm:Regular Rate:Normal  Left ventricle: The cavity size was normal. Wall thickness was   increased in a pattern of mild LVH. The estimated ejection   fraction was 55%. Wall motion was normal; there were no regional   wall motion abnormalities. - Aortic valve: There was no stenosis. There was trivial   regurgitation. - Aorta: The aortic root was dilated to 5.0 cm and the ascending   aorta measured up to 4.6 cm. - Mitral valve: There was mild regurgitation. - Left atrium: No evidence of thrombus in the atrial cavity or   appendage. - Right ventricle: The cavity size was normal. There was no   evidence of concentric hypertrophy. - Right atrium: No evidence of thrombus in the atrial cavity or   appendage. - Atrial septum: PFO was noted by bubble study. 10/2015   Neuro/Psych CVA, Residual Symptoms negative psych ROS   GI/Hepatic HCC   Endo/Other  negative endocrine ROS  Renal/GU Renal disease     Musculoskeletal  (+) Arthritis ,   Abdominal   Peds  Hematology negative hematology ROS (+)   Anesthesia Other Findings   Reproductive/Obstetrics negative OB ROS                             Anesthesia Physical  Anesthesia Plan  ASA: IV  Anesthesia Plan: MAC   Post-op Pain Management:    Induction: Intravenous  PONV Risk Score  and Plan:   Airway Management Planned:   Additional Equipment:   Intra-op Plan:   Post-operative Plan:   Informed Consent:   Plan Discussed with:   Anesthesia Plan Comments:         Anesthesia Quick Evaluation

## 2018-11-02 NOTE — ED Provider Notes (Signed)
[facility] DEPT Provider Note   CSN: [id number] Arrival date & time: 11/02/18  0754     History   Chief Complaint Chief Complaint  Patient presents with   Weakness    HPI Antonio Manning is a 78 y.o. male.     The history is provided by the patient and medical records. No language interpreter was used.  Emesis Severity:  Moderate Duration:  2 days Timing:  Intermittent Quality:  Stomach contents Progression:  Unchanged Chronicity:  New Recent urination:  Normal Relieved by:  Nothing Worsened by:  Nothing Ineffective treatments:  None tried Associated symptoms: diarrhea   Associated symptoms: no abdominal pain, no chills, no cough, no fever and no headaches     Past Medical History:  Diagnosis Date   Arthritis 05-26-11   osteoarthritis,lt. hip, and neck area   Cancer ([place]) 05-26-11   skin cancer lesions-multiple   Cataract 05/26/11   right   Cholesterol serum elevated 26-May-2011   tx. Lipitor   Expressive aphasia 10/2015   Hearing loss 05-26-2011   wears hearing aids bilaterally   Kidney calculi May 26, 2011   past hx. x1-passed on own, mild prostate issues-no meds   PONV (postoperative nausea and vomiting) 05-26-11   with cataract surgery   Stroke ([place]) 10/2015    Patient Active Problem List   Diagnosis Date Noted   Goals of care, counseling/discussion 09/06/2018   Hepatocellular carcinoma ([place]) 08/04/2018   Pre-operative cardiovascular examination 07/06/2018   PAF (paroxysmal atrial fibrillation) ([place]) 07/06/2018   Liver lesion 07/06/2018   Long term current use of anticoagulant 03/12/2018   History of loop recorder 01/17/2016   Paroxysmal atrial flutter ([place]) 11/25/2015   Expressive aphasia 10/27/2015   Cerebral thrombosis with cerebral infarction 10/27/2015   Aphasia 10/27/2015   History of CVA (cerebrovascular accident) 10/27/2015   Thoracic aorta atherosclerosis ([place]) 04/04/2014   Hyperlipidemia 04/04/2014     Fatigue 10/30/2013   Bradycardia 10/30/2013   S/P left hip replacement 05/21/2011    Past Surgical History:  Procedure Laterality Date   BACK SURGERY  May 26, 2011   Lumbar disc surgery-no hardware   CATARACT EXTRACTION W/ INTRAOCULAR LENS IMPLANT  2011/05/26   left eye   EP IMPLANTABLE DEVICE N/A 10/29/2015   Procedure: Loop Recorder Insertion;  Surgeon: Evans Lance, MD;  Location: [facility];  Service: Cardiovascular;  Laterality: N/A;   EYE SURGERY     eye lid lift    I&D EXTREMITY Right 01/17/2016   Procedure: IRRIGATION AND DEBRIDEMENT LEG LACERATION;  Surgeon: Vickey Huger, MD;  Location: [facility];  Service: Orthopedics;  Laterality: Right;   TEE WITHOUT CARDIOVERSION N/A 10/29/2015   Procedure: TRANSESOPHAGEAL ECHOCARDIOGRAM (TEE);  Surgeon: Larey Dresser, MD;  Location: [facility];  Service: Cardiovascular;  Laterality: N/A;   TOTAL HIP ARTHROPLASTY  05/20/2011   Procedure: TOTAL HIP ARTHROPLASTY ANTERIOR APPROACH;  Surgeon: Mauri Pole, MD;  Location: [facility];  Service: Orthopedics;  Laterality: Left;        Home Medications    Prior to Admission medications   Medication Sig Start Date End Date Taking? Authorizing Provider  amLODipine (NORVASC) 2.5 MG tablet Take 1 tablet (2.5 mg total) by mouth daily. 10/25/18   Alla Feeling, NP  atorvastatin (LIPITOR) 40 MG tablet Take 40 mg by mouth daily.  03/24/18   [provider]  Coenzyme Q10 (COQ10) 100 MG CAPS Take 100 mg by mouth daily.    [provider]  Arne Cleveland  5 MG TABS tablet TAKE 1 TABLET BY MOUTH TWICE A DAY 12/27/17   Evans Lance, MD  fluticasone Asencion Islam) 50 MCG/ACT nasal spray  09/06/18   [provider]  levothyroxine (SYNTHROID) 25 MCG tablet Take 25 mcg by mouth daily before breakfast.    [provider]  mirtazapine (REMERON) 7.5 MG tablet TAKE 1 TABLET BY MOUTH AT BEDTIME. 10/30/18   [name], MD  Multiple Vitamin (MULTIVITAMIN) tablet Take 1 tablet by mouth  daily.    [provider]  ondansetron (ZOFRAN) 8 MG tablet Take 1 tablet (8 mg total) by mouth 2 (two) times daily as needed (Nausea or vomiting). 08/09/18   [name], MD  pantoprazole (PROTONIX) 40 MG tablet Take 40 mg by mouth daily. 09/06/18   [provider]  prochlorperazine (COMPAZINE) 10 MG tablet TAKE 1 TABLET (10 MG TOTAL) BY MOUTH EVERY 6 (SIX) HOURS AS NEEDED FOR NAUSEA OR VOMITING. 10/31/18   [name], MD  vitamin C (ASCORBIC ACID) 250 MG tablet Take 250 mg by mouth daily.    [provider]    Family History Family History  Problem Relation Age of Onset   Heart Problems Mother    Cancer Father    Cancer Brother    Stroke Brother    Heart Problems Brother    Cancer Brother    Heart Problems Brother    Heart Problems Sister    Hyperlipidemia Sister    Birth defects Child    Other Child        one kidney    Social History Social History   Tobacco Use   Smoking status: Former Smoker    Years: 25.00    Types: Cigarettes    Quit date: 05/12/1988    Years since quitting: 30.4   Smokeless tobacco: Never Used  Substance Use Topics   Alcohol use: No    Comment: he used to drink alcohol lightly and quit 50 years    Drug use: No     Allergies   Codeine   Review of Systems Review of Systems  Constitutional: Positive for fatigue. Negative for chills, diaphoresis and fever.  HENT: Negative for congestion.   Eyes: Negative for visual disturbance.  Respiratory: Positive for shortness of breath. Negative for cough, chest tightness, wheezing and stridor.   Cardiovascular: Negative for chest pain, palpitations and leg swelling.  Gastrointestinal: Positive for diarrhea, nausea and vomiting. Negative for abdominal distention, abdominal pain and constipation.  Genitourinary: Negative for dysuria, flank pain and frequency.  Musculoskeletal: Negative for back pain, neck pain and neck stiffness.  Skin: Negative for wound.    Neurological: Negative for dizziness, light-headedness and headaches.  All other systems reviewed and are negative.    Physical Exam Updated Vital Signs BP 98/78 (BP Location: Right Arm)    Pulse (!) 102    Temp (!) 94.2 F (34.6 C) (Oral)    Resp 14    Ht 6' (1.829 m)    Wt 67.1 kg    SpO2 94%    BMI 20.07 kg/m   Physical Exam Vitals signs and nursing note reviewed.  Constitutional:      Appearance: He is well-developed.  HENT:     Head: Normocephalic and atraumatic.     Nose: Nose normal. No congestion or rhinorrhea.     Mouth/Throat:     Mouth: Mucous membranes are dry.  Eyes:     Conjunctiva/sclera: Conjunctivae normal.     Pupils: Pupils are equal, round,  and reactive to light.  Neck:     Musculoskeletal: Neck supple. No muscular tenderness.  Cardiovascular:     Rate and Rhythm: Tachycardia present. Rhythm irregular.     Heart sounds: No murmur.  Pulmonary:     Effort: Pulmonary effort is normal. No respiratory distress.     Breath sounds: Rhonchi present. No wheezing or rales.  Chest:     Chest wall: No tenderness.  Abdominal:     General: There is no distension.     Palpations: Abdomen is soft.     Tenderness: There is no abdominal tenderness.  Musculoskeletal:        General: No swelling, tenderness, deformity or signs of injury.     Right lower leg: No edema.     Left lower leg: No edema.  Skin:    General: Skin is warm and dry.     Capillary Refill: Capillary refill takes less than 2 seconds.     Coloration: Skin is pale.     Findings: No erythema or rash.  Neurological:     General: No focal deficit present.     Mental Status: He is alert.      ED Treatments / Results  Labs (all labs ordered are listed, but only abnormal results are displayed) Labs Reviewed  BASIC METABOLIC PANEL - Abnormal; Notable for the following components:      Result Value   Chloride 96 (*)    CO2 11 (*)    Glucose, Bld 143 (*)    Anion gap 33 (*)    All other  components within normal limits  CBC - Abnormal; Notable for the following components:   WBC 12.0 (*)    RBC 2.17 (*)    Hemoglobin 6.6 (*)    HCT 23.2 (*)    MCV 106.9 (*)    MCHC 28.4 (*)    RDW 16.9 (*)    All other components within normal limits  URINALYSIS, ROUTINE W REFLEX MICROSCOPIC - Abnormal; Notable for the following components:   APPearance HAZY (*)    Glucose, UA 50 (*)    Ketones, ur 20 (*)    Protein, ur 100 (*)    Bacteria, UA RARE (*)    All other components within normal limits  HEPATIC FUNCTION PANEL - Abnormal; Notable for the following components:   AST 69 (*)    Bilirubin, Direct 0.4 (*)    All other components within normal limits  CK - Abnormal; Notable for the following components:   Total CK 24 (*)    All other components within normal limits  TSH - Abnormal; Notable for the following components:   TSH 29.022 (*)    All other components within normal limits  LACTIC ACID, PLASMA - Abnormal; Notable for the following components:   Lactic Acid, Venous >11.0 (*)    All other components within normal limits  LACTIC ACID, PLASMA - Abnormal; Notable for the following components:   Lactic Acid, Venous >11.0 (*)    All other components within normal limits  AMMONIA - Abnormal; Notable for the following components:   Ammonia 41 (*)    All other components within normal limits  PROTIME-INR - Abnormal; Notable for the following components:   Prothrombin Time 20.1 (*)    INR 1.7 (*)    All other components within normal limits  CBG MONITORING, ED - Abnormal; Notable for the following components:   Glucose-Capillary 119 (*)    All other components within normal  limits  SARS CORONAVIRUS 2 (HOSPITAL ORDER, PERFORMED IN [facility])  URINE CULTURE  CULTURE, BLOOD (ROUTINE X 2)  CULTURE, BLOOD (ROUTINE X 2)  MRSA PCR SCREENING  NOVEL CORONAVIRUS, NAA (HOSPITAL ORDER, SEND-OUT TO REF LAB)  LIPASE, BLOOD  MAGNESIUM  TROPONIN I (HIGH SENSITIVITY)    T4, FREE  TROPONIN I (HIGH SENSITIVITY)  COMPREHENSIVE METABOLIC PANEL  MAGNESIUM  LACTIC ACID, PLASMA  LACTIC ACID, PLASMA  PROCALCITONIN  POC OCCULT BLOOD, ED  PREPARE RBC (CROSSMATCH)  TYPE AND SCREEN  PREPARE RBC (CROSSMATCH)  PREPARE RBC (CROSSMATCH)    EKG EKG Interpretation  Date/Time:  Thursday November 02 2018 08:22:20 EDT Ventricular Rate:  103 PR Interval:    QRS Duration: 100 QT Interval:  363 QTC Calculation: 476 R Axis:   -78 Text Interpretation:  Sinus tachycardia with irregular rate Left anterior fascicular block Abnormal R-wave progression, late transition Borderline prolonged QT interval When compared to prior, more irregular rate.  No STEMI Confirmed by Antony Blackbird [phone]) on 11/02/2018 3:35:39 PM   Radiology Dg Chest Portable 1 View  Result Date: 11/02/2018 CLINICAL DATA:  Diarrhea, weakness, history of liver cancer EXAM: PORTABLE CHEST 1 VIEW COMPARISON:  08/11/2018 FINDINGS: Trace right pleural effusion. Mild right basilar atelectasis. No pneumothorax. Stable cardiomediastinal silhouette. No aggressive osseous lesion. IMPRESSION: Trace right pleural effusion with atelectasis. Electronically Signed   By: Kathreen Devoid   On: 11/02/2018 10:17    Procedures Procedures (including critical care time)  CRITICAL CARE Performed by: Gwenyth Allegra Taelyr Jantz Total critical care time: 60 minutes Critical care time was exclusive of separately billable procedures and treating other patients. Critical care was necessary to treat or prevent imminent or life-threatening deterioration. Critical care was time spent personally by me on the following activities: development of treatment plan with patient and/or surrogate as well as nursing, discussions with consultants, evaluation of patient's response to treatment, examination of patient, obtaining history from patient or surrogate, ordering and performing treatments and interventions, ordering and review of laboratory studies,  ordering and review of radiographic studies, pulse oximetry and re-evaluation of patient's condition.  Antonio Manning was evaluated in Emergency Department on 11/02/2018 for the symptoms described in the history of present illness. He was evaluated in the context of the global COVID-19 pandemic, which necessitated consideration that the patient might be at risk for infection with the SARS-CoV-2 virus that causes COVID-19. Institutional protocols and algorithms that pertain to the evaluation of patients at risk for COVID-19 are in a state of rapid change based on information released by regulatory bodies including the CDC and federal and state organizations. These policies and algorithms were followed during the patient's care in the ED.   Medications Ordered in ED Medications  pantoprazole (PROTONIX) 80 mg in sodium chloride 0.9 % 250 mL (0.32 mg/mL) infusion (has no administration in time range)  ceFEPIme (MAXIPIME) 2 g in sodium chloride 0.9 % 100 mL IVPB ( Intravenous MAR Unhold 11/02/18 1447)  0.9 %  sodium chloride infusion (has no administration in time range)  vancomycin (VANCOCIN) IVPB 1000 mg/200 mL premix ( Intravenous MAR Unhold 11/02/18 1447)  0.9 %  sodium chloride infusion (Manually program via Guardrails IV Fluids) (has no administration in time range)  octreotide (SANDOSTATIN) 2 mcg/mL load via infusion 50 mcg (has no administration in time range)    And  octreotide (SANDOSTATIN) 500 mcg in sodium chloride 0.9 % 250 mL (2 mcg/mL) infusion (has no administration in time range)  0.9 %  sodium chloride infusion (Manually program via Guardrails IV Fluids) (has no administration in time range)  iohexol (OMNIPAQUE) 300 MG/ML solution 30 mL (has no administration in time range)  sodium chloride 0.9 % bolus 500 mL (0 mLs Intravenous Stopped 11/02/18 1043)  0.9 %  sodium chloride infusion (Manually program via Guardrails IV Fluids) ( Intravenous New Bag/Given 11/02/18 1024)  ceFEPIme (MAXIPIME) 2 g in  sodium chloride 0.9 % 100 mL IVPB (0 g Intravenous Stopped 11/02/18 1133)  metroNIDAZOLE (FLAGYL) IVPB 500 mg (0 mg Intravenous Stopped 11/02/18 1223)  sodium chloride 0.9 % bolus 1,750 mL (1,750 mLs Intravenous New Bag/Given 11/02/18 1024)  vancomycin (VANCOCIN) 1,250 mg in sodium chloride 0.9 % 250 mL IVPB (0 mg Intravenous Stopped 11/02/18 1423)  pantoprazole (PROTONIX) 80 mg in sodium chloride 0.9 % 100 mL IVPB ( Intravenous MAR Unhold 11/02/18 1447)  propofol (DIPRIVAN) 10 mg/mL bolus/IV push (has no administration in time range)     Initial Impression / Assessment and Plan / ED Course  I have reviewed the triage vital signs and the nursing notes.  Pertinent labs & imaging results that were available during my care of the patient were reviewed by me and considered in my medical decision making (see chart for details).        Antonio Manning is a 78 y.o. male with a past medical history significant for prior left hip replacement, hepatocellular carcinoma currently on chemotherapy, prior stroke, paroxysmal atrial fibrillation on Eliquis therapy, hyperlipidemia, and hypercholesterolemia who presents for shortness of breath, nausea, vomiting, fatigue and left hip pain.  According to EMS, patient was outside all night and was found laying down on his porch.  He reports that he laid down the ports to "get comfortable.  He reports he is been not been feeling well since yesterday.  He reports that he has been having difficulty breathing with shortness of breath but denies chest pain or cough.  He denies fevers or chills however his rectal temperature on arrival is 94.  He reports he is having nausea, vomiting, and diarrhea since yesterday and he is feeling very weak and fatigued.  He denies any blood in his stool.  He denies any falls and reports his left hip pain does not feel like when he has been dislocated in the past.  He reports the left hip pain is resolved currently and he is more concerned about his  fatigue.  On exam, patient is very pale and his mucous membranes and skin.  Abdomen is nontender with normal bowel sounds.  Lungs are coarse bilaterally.  No murmur.  Chest is nontender.  Back is nontender.  CVA area is nontender.  Patient's left hip is nontender and he does not appear to have a leg length difference.  He has palpable pulses in both feet and he can move both of his feet.  He could feel me touch on both sides.  Clinically I am concerned about the patient's cold temperature, severe fatigue, pallor, nausea, vomiting, diarrhea, shortness of breath.  As the patient reports he was outside all night and was found to be lying on a deck, I am concerned that he may have an infectious etiology, rhabdo, or electrolyte imbalance with his fluid loss with the vomiting and diarrhea.  Given his history of hip replacement and hip dislocation, will get a pelvis and hip x-ray will also get chest x-ray to look for something like pneumonia with the shortness of breath and rhonchi on exam.  In the  setting of the coronavirus pandemic in this area, will check a rapid coronavirus test as patient may need admission if abnormalities are discovered on exam.  Patient will be given a small amount of fluids to start rehydration.  9:57 AM Nursing reports that the patient's hemoglobin is returned at 6.6, down from over 10 several weeks ago.  Patient also had emesis that appeared dark.  Concern for GI bleed.  He will have a fecal occult test checked.  Lactic acid also returned at greater than 11.  Given his soft blood pressures in the 90s, his hypothermia, the shortness of breath, nausea vomiting, diarrhea, I am concerned he may have an infectious etiology.  Patient also is a leukocytosis of 12.  Anion gap is 33 likely related to the lactic acidosis and CO2 of 11.   Patient will be given a unit of blood for symptomatic anemia and will be started on broad-spectrum antibiotics for likely sepsis with a lactic acid greater  than 11.   Final Clinical Impressions(s) / ED Diagnoses   Final diagnoses:  Sepsis, due to unspecified organism, unspecified whether acute organ dysfunction present ([place])  Lactic acidosis    Clinical Impression: 1. Sepsis, due to unspecified organism, unspecified whether acute organ dysfunction present ([place])   2. Lactic acidosis     Disposition: Admit  This note was prepared with assistance of Dragon voice recognition software. Occasional wrong-word or sound-a-like substitutions may have occurred due to the inherent limitations of voice recognition software.      Zarrah Loveland, Gwenyth Allegra, MD 11/02/18 1536

## 2018-11-03 ENCOUNTER — Inpatient Hospital Stay (HOSPITAL_COMMUNITY): Payer: Medicare Other | Admitting: Certified Registered"

## 2018-11-03 ENCOUNTER — Inpatient Hospital Stay (HOSPITAL_COMMUNITY): Payer: Medicare Other

## 2018-11-03 ENCOUNTER — Other Ambulatory Visit: Payer: Self-pay

## 2018-11-03 ENCOUNTER — Encounter (HOSPITAL_COMMUNITY)
Admission: EM | Disposition: A | Discharge status: Home Health | Payer: Self-pay | Source: Home / Self Care | Attending: Internal Medicine

## 2018-11-03 DIAGNOSIS — D62 Acute posthemorrhagic anemia: Secondary | ICD-10-CM

## 2018-11-03 DIAGNOSIS — R0609 Other forms of dyspnea: Secondary | ICD-10-CM

## 2018-11-03 DIAGNOSIS — K2210 Ulcer of esophagus without bleeding: Secondary | ICD-10-CM

## 2018-11-03 DIAGNOSIS — K922 Gastrointestinal hemorrhage, unspecified: Secondary | ICD-10-CM

## 2018-11-03 HISTORY — PX: BIOPSY: SHX5522

## 2018-11-03 HISTORY — PX: ESOPHAGOGASTRODUODENOSCOPY (EGD) WITH PROPOFOL: SHX5813

## 2018-11-03 LAB — URINE CULTURE: Culture: NO GROWTH

## 2018-11-03 LAB — ECHOCARDIOGRAM COMPLETE
Height: 72 in
Weight: 2469.15 oz

## 2018-11-03 LAB — HEMOGLOBIN AND HEMATOCRIT, BLOOD
HCT: 24.9 % — ABNORMAL LOW (ref 39.0–52.0)
Hemoglobin: 8.2 g/dL — ABNORMAL LOW (ref 13.0–17.0)

## 2018-11-03 LAB — CBC
HCT: 24.9 % — ABNORMAL LOW (ref 39.0–52.0)
HCT: 24.9 % — ABNORMAL LOW (ref 39.0–52.0)
HCT: 24.6 % — ABNORMAL LOW (ref 39.0–52.0)
Hemoglobin: 8 g/dL — ABNORMAL LOW (ref 13.0–17.0)
Hemoglobin: 8.3 g/dL — ABNORMAL LOW (ref 13.0–17.0)
Hemoglobin: 8.1 g/dL — ABNORMAL LOW (ref 13.0–17.0)
MCH: 30.8 pg (ref 26.0–34.0)
MCH: 31.6 pg (ref 26.0–34.0)
MCH: 31.5 pg (ref 26.0–34.0)
MCHC: 32.1 g/dL (ref 30.0–36.0)
MCHC: 33.3 g/dL (ref 30.0–36.0)
MCHC: 32.9 g/dL (ref 30.0–36.0)
MCV: 95.8 fL (ref 80.0–100.0)
MCV: 94.7 fL (ref 80.0–100.0)
MCV: 95.7 fL (ref 80.0–100.0)
Platelets: 208 10*3/uL (ref 150–400)
Platelets: 193 10*3/uL (ref 150–400)
Platelets: 190 10*3/uL (ref 150–400)
RBC: 2.6 MIL/uL — ABNORMAL LOW (ref 4.22–5.81)
RBC: 2.63 MIL/uL — ABNORMAL LOW (ref 4.22–5.81)
RBC: 2.57 MIL/uL — ABNORMAL LOW (ref 4.22–5.81)
RDW: 16.1 % — ABNORMAL HIGH (ref 11.5–15.5)
RDW: 16.5 % — ABNORMAL HIGH (ref 11.5–15.5)
RDW: 16.5 % — ABNORMAL HIGH (ref 11.5–15.5)
WBC: 8.5 10*3/uL (ref 4.0–10.5)
WBC: 7.9 10*3/uL (ref 4.0–10.5)
WBC: 7.9 10*3/uL (ref 4.0–10.5)
nRBC: 0.5 % — ABNORMAL HIGH (ref 0.0–0.2)
nRBC: 1.3 % — ABNORMAL HIGH (ref 0.0–0.2)
nRBC: 1.5 % — ABNORMAL HIGH (ref 0.0–0.2)

## 2018-11-03 LAB — LACTIC ACID, PLASMA
Lactic Acid, Venous: 1.7 mmol/L (ref 0.5–1.9)
Lactic Acid, Venous: 1.5 mmol/L (ref 0.5–1.9)

## 2018-11-03 LAB — BASIC METABOLIC PANEL
Anion gap: 11 (ref 5–15)
BUN: 20 mg/dL (ref 8–23)
CO2: 24 mmol/L (ref 22–32)
Calcium: 8 mg/dL — ABNORMAL LOW (ref 8.9–10.3)
Chloride: 103 mmol/L (ref 98–111)
Creatinine, Ser: 0.54 mg/dL — ABNORMAL LOW (ref 0.61–1.24)
GFR calc Af Amer: >60 mL/min (ref >60)
GFR calc non Af Amer: >60 mL/min (ref >60)
Glucose, Bld: 90 mg/dL (ref 70–99)
Potassium: 3.6 mmol/L (ref 3.5–5.1)
Sodium: 138 mmol/L (ref 135–145)

## 2018-11-03 LAB — GLUCOSE, CAPILLARY: Glucose-Capillary: 86 mg/dL (ref 70–99)

## 2018-11-03 LAB — HEMOGLOBIN A1C
Hgb A1c MFr Bld: 5 % (ref 4.8–5.6)
Mean Plasma Glucose: 96.8 mg/dL

## 2018-11-03 LAB — PROCALCITONIN: Procalcitonin: 1.36 ng/mL

## 2018-11-03 SURGERY — ESOPHAGOGASTRODUODENOSCOPY (EGD) WITH PROPOFOL
Anesthesia: Monitor Anesthesia Care

## 2018-11-03 MED ORDER — LACTATED RINGERS IV SOLN: INTRAVENOUS | Status: DC | PRN

## 2018-11-03 MED ORDER — PROPOFOL 500 MG/50ML IV EMUL
INTRAVENOUS | Status: DC | PRN
  Administered 2018-11-03: 125 ug/kg/min via INTRAVENOUS

## 2018-11-03 MED ORDER — PROPOFOL 10 MG/ML IV BOLUS
INTRAVENOUS | Status: AC
  Filled 2018-11-03: qty 20

## 2018-11-03 MED ORDER — SUCRALFATE 1 GM/10ML PO SUSP
1.0000 g | Freq: Three times a day (TID) | ORAL | Status: DC
  Administered 2018-11-03 – 2018-11-09 (×21): 1 g via ORAL
  Filled 2018-11-03 (×22): qty 10

## 2018-11-03 MED ORDER — ENSURE ENLIVE PO LIQD: 237.0000 mL | Freq: Two times a day (BID) | ORAL | Status: DC

## 2018-11-03 MED ORDER — SODIUM CHLORIDE 0.9 % IV SOLN
3.0000 g | Freq: Four times a day (QID) | INTRAVENOUS | Status: DC
  Administered 2018-11-03 – 2018-11-05 (×7): 3 g via INTRAVENOUS
  Filled 2018-11-03 (×2): qty 3
  Filled 2018-11-03 (×2): qty 8
  Filled 2018-11-03 (×3): qty 3

## 2018-11-03 MED ORDER — PANTOPRAZOLE SODIUM 40 MG IV SOLR
40.0000 mg | Freq: Two times a day (BID) | INTRAVENOUS | Status: DC
  Administered 2018-11-03 – 2018-11-04 (×4): 40 mg via INTRAVENOUS
  Filled 2018-11-03 (×5): qty 40

## 2018-11-03 MED ORDER — METOPROLOL TARTRATE 25 MG PO TABS
25.0000 mg | ORAL_TABLET | Freq: Four times a day (QID) | ORAL | Status: DC
  Administered 2018-11-03 (×2): 25 mg via ORAL
  Filled 2018-11-03 (×4): qty 1

## 2018-11-03 MED ORDER — MAGNESIUM SULFATE 2 GM/50ML IV SOLN
2.0000 g | Freq: Once | INTRAVENOUS | Status: AC
  Administered 2018-11-03: 2 g via INTRAVENOUS
  Filled 2018-11-03: qty 50

## 2018-11-03 MED ORDER — ADULT MULTIVITAMIN W/MINERALS CH
1.0000 | ORAL_TABLET | Freq: Every day | ORAL | Status: DC
  Administered 2018-11-04 – 2018-11-09 (×5): 1 via ORAL
  Filled 2018-11-03 (×5): qty 1

## 2018-11-03 SURGICAL SUPPLY — 15 items

## 2018-11-03 NOTE — Op Note (Signed)
[facility] Patient Name: Antonio Manning Procedure Date: 11/03/2018 MRN: [id number] Attending MD: Otis Brace , MD Date of Birth: 1940-08-25 CSN: [id number] Age: 78 Admit Type: Inpatient Procedure:                Upper GI endoscopy Indications:              Coffee-ground emesis Providers:                Otis Brace, MD, Cleda Daub, RN, Cletis Athens, Technician Referring MD:              Medicines:                Sedation Administered by an Anesthesia Professional Complications:            No immediate complications. Estimated Blood Loss:     Estimated blood loss was minimal. Procedure:                Pre-Anesthesia Assessment:                           - Prior to the procedure, a History and Physical                            was performed, and patient medications and                            allergies were reviewed. The patient's tolerance of                            previous anesthesia was also reviewed. The risks                            and benefits of the procedure and the sedation                            options and risks were discussed with the patient.                            All questions were answered, and informed consent                            was obtained. Prior Anticoagulants: The patient has                            taken Eliquis (apixaban), last dose was 1 day prior                            to procedure. ASA Grade Assessment: IV - A patient                            with severe systemic disease that is a constant  threat to life. After reviewing the risks and                            benefits, the patient was deemed in satisfactory                            condition to undergo the procedure.                           After obtaining informed consent, the endoscope was                            passed under direct vision. Throughout the   procedure, the patient's blood pressure, pulse, and                            oxygen saturations were monitored continuously. The                            GIF-H190 ([id number] Olympus gastroscope was                            introduced through the mouth, and advanced to the                            second part of duodenum. The upper GI endoscopy was                            performed with moderate difficulty due to presence                            of food. The patient tolerated the procedure well. Scope In: Scope Out: Findings:      LA Grade D (one or more mucosal breaks involving at least 75% of       esophageal circumference) esophagitis with no bleeding was found in the       Mid and distal esophagus. Biopsies were taken with a cold forceps for       histology.      There is no endoscopic evidence of varices in the entire esophagus.      Excessive fluid was found in the gastric fundus and in the gastric body.      Scattered mild inflammation characterized by congestion (edema) and       erythema was found in the gastric body. Biopsies were taken with a cold       forceps for histology.      Extrinsic compression on the stomach was found in the gastric body.      Diffuse mild mucosal changes characterized by friability (with contact       bleeding) were found in the entire examined stomach.      The cardia and gastric fundus were normal on retroflexion except for       retained fluid.      The duodenal bulb, first portion of the duodenum and second portion of       the duodenum were normal. Impression:               -  LA Grade D erosive esophagitis. Biopsied.                           - Excessive gastric fluid.                           - Gastritis. Biopsied.                           - Extrinsic compression in the gastric body.                           Dionisio Jeno (with contact bleeding) mucosa in the                            stomach.                           - Normal  duodenal bulb, first portion of the                            duodenum and second portion of the duodenum. Moderate Sedation:      Moderate (conscious) sedation was personally administered by an       anesthesia professional. The following parameters were monitored: oxygen       saturation, heart rate, blood pressure, and response to care. Recommendation:           - Return patient to hospital ward for ongoing care.                           - Full liquid diet.                           - Resume Eliquis (apixaban) at prior dose tomorrow.                           - Await pathology results. Procedure Code(s):        --- Professional ---                           [phone], Esophagogastroduodenoscopy, flexible,                            transoral; with biopsy, single or multiple Diagnosis Code(s):        --- Professional ---                           K20.8, Other esophagitis                           K29.70, Gastritis, unspecified, without bleeding                           K31.89, Other diseases of stomach and duodenum                           K92.2, Gastrointestinal hemorrhage, unspecified  K92.0, Hematemesis CPT copyright 2019 American Medical Association. All rights reserved. The codes documented in this report are preliminary and upon coder review may  be revised to meet current compliance requirements. Otis Brace, MD Otis Brace, MD 11/03/2018 9:48:58 AM Number of Addenda: 0

## 2018-11-03 NOTE — Brief Op Note (Signed)
11/02/2018 - 11/03/2018  9:49 AM  PATIENT:  Antonio Manning  78 y.o. male  PRE-OPERATIVE DIAGNOSIS:  upper gi bleed  POST-OPERATIVE DIAGNOSIS:  esophagitis / gastritis  PROCEDURE:  Procedure(s): ESOPHAGOGASTRODUODENOSCOPY (EGD) WITH PROPOFOL (N/A) BIOPSY  SURGEON:  Surgeon(s) and Role:    * Berlie Persky, MD - Primary  Findings ----------- -EGD showed severe erosive esophagitis.  No varices.  Mild gastritis and large amount of retained fluid in the stomach.  No active bleeding  Recommendations ----------------------- - Full liquid diet - DC octreotide - Carafate for 2 weeks - continue  Protonix for now - Monitor H&H.  GI will follow -Findings discussed with patient's wife over the phone.  Otis Brace MD, [place] 11/03/2018, 9:53 AM  Contact #  [phone]

## 2018-11-03 NOTE — Progress Notes (Addendum)
PROGRESS NOTE   Antonio Manning  XHB:[id number]    DOB: 1940/09/21    DOA: 11/02/2018  PCP: Hulan Fess, MD   I have briefly reviewed patients previous medical records in [facility].  Chief Complaint  Patient presents with  . Weakness    Brief Narrative:  78 year old male with PMH of stage III hepatocellular carcinoma currently on immunotherapy and Avastin, HTN, HLD, hard of hearing, cryptogenic stroke, paroxysmal atrial fibrillation on Eliquis, presented to the ED on 11/02/2018 due to weakness, dyspnea and coffee-ground emesis.  In the ED, patient met sepsis criteria including hypothermia, elevated lactate, tachypnea and tachycardia, initiated on broad-spectrum antibiotics.  He also had acute blood loss anemia/hemoglobin 6.6 due to acute upper GI bleed.  Admitted to stepdown unit for sepsis, acute respiratory failure with hypoxia, elevated lactate, acute upper GI bleed with ABLA and A. fib with RVR.  [facility], cardiology and medical oncology consulted.  Status post EGD 7/3.  Improved.   Assessment & Plan:   Principal Problem:   Sepsis ([place]) Active Problems:   S/P left hip replacement   Fatigue   Hyperlipidemia   History of CVA (cerebrovascular accident)   PAF (paroxysmal atrial fibrillation) (HCC)   Liver lesion   Hepatocellular carcinoma (HCC)   Lactic acidosis   High anion gap metabolic acidosis   Symptomatic anemia   Coffee ground emesis   Hypothyroidism   Atrial fibrillation with RVR (HCC)   Abdominal pain   Left hip pain   Sepsis  Present on admission (hypothermia, tachycardia, leukocytosis and elevated lactate)  Could be related to aspiration during emesis episodes.  CT abdomen shows right lower lobe atelectasis versus pneumonia.  Blood cultures x2: Negative to date.  SARS coronavirus 2: Negative.  Urine culture negative.  MRSA PCR negative.  Procalcitonin 1.01 > 1.36.  Lactate has normalized.  He was treated per sepsis criteria with IV fluids and started on  broad-spectrum IV antibiotics including IV vancomycin and cefepime.  Given concern for possible aspiration pneumonia, negative MRSA PCR, discontinued IV vancomycin and cefepime and changed to IV Unasyn.  At discharge may consider oral Augmentin.  Sepsis physiology seems to have resolved.  Monitor closely.  Acute respiratory failure with hypoxia  Secondary to pneumonia/atelectasis.  Treat as above and wean off oxygen as tolerated.  Incentive spirometry  Acute upper GI bleed/severe erosive esophagitis and mild gastritis  He was treated supportively with bowel rest/n.p.o., IV Protonix drip, IV octreotide drip.  Interestingly FOBT was negative.  [facility] was consulted and underwent EGD 7/3 with findings as noted below.  Post EGD placed on full liquid diet, octreotide discontinued, Carafate for 2 weeks, continue Protonix for now.  Acute blood loss anemia/symptomatic  Secondary to acute upper GI bleed.  S/p 2 units PRBC and hemoglobin has improved from 6.6 on admission to 8.1 range and stable.  Transfuse as needed for hemoglobin 7 or less.  Eliquis obviously on hold.  Lactic acidosis  Secondary to sepsis, acute upper GI bleed, acute blood loss anemia  Treated as above and resolved.  High anion gap metabolic acidosis  Anion gap 33 on admission.  Resolved.  Hypothyroidism  TSH 29.022, free T4: 0.97.  Checking free T3.  Currently on Synthroid 50 mcg daily.  Discussed with patient regarding compliance and may consider increasing Synthroid dose.  Atrial fibrillation with RVR  Cardiology follow-up appreciated.  Patient was placed on IV Cardizem drip and reverted to sinus rhythm overnight of admission.  Eliquis on hold for  now and resume when okay with GI.  Started metoprolol 25 mg every 6 hourly and wean off of IV diltiazem.  Cardiomyopathy  TTE 7/3: LVEF 45-50%.  No chest pain reported.  Management per cardiology.  Elevated troponin  Likely due to demand ischemia from  acute illness discussed above rather than due to ACS.  Essential hypertension  Now on metoprolol 25 mg every 6 hourly.  Monitor closely and adjust medications as needed.  Stage III hepatocellular carcinoma  Oncology input appreciated.  Currently on atezolizumab (immunotherapy) and Avastin, last dose 6/24.  History of CVA  Eliquis currently on hold due to GI bleed.  Resume when okay with GI.  Hyperlipidemia  Continue atorvastatin.  Abdominal pain  Present on admission.  Likely related to esophagitis now seen on EGD.  Stable hepatic metastatic lesions and moderate ascites.  Abdominal pain has resolved.  Left hip pain  X-ray without fractures or acute findings.  Hypomagnesemia  Replace and follow.   DVT prophylaxis: SCDs Code Status: Full Family Communication: Discussed with spouse, updated care and answered questions. Disposition: DC home pending clinical improvement, possibly in the next 1 to 2 days   Consultants:  [facility] Medical oncology  Procedures:  EGD 3/7  Antimicrobials:  IV cefepime and vancomycin 7/2-7/3 IV Unasyn 7/3 >   Subjective: Seen this morning prior to procedures.  Hard of hearing.  Overall feels much better.  Denies abdominal pain.  Had 2 episodes of coffee-ground emesis overnight but none since.  Denies melena.  No chest pain, dyspnea, cough or palpitations.  Denies dizziness or lightheadedness.  ROS: As above, otherwise negative.  Objective:  Vitals:   11/03/18 1200 11/03/18 1225 11/03/18 1600 11/03/18 1643  BP: (!) 151/91 (!) 151/91 (!) 135/97   Pulse: (!) 106 93 91 (!) 103  Resp: 17     Temp:  98.1 F (36.7 C) 97.6 F (36.4 C)   TempSrc:  Oral Oral   SpO2: 98%  99%   Weight:      Height:        Examination:  General exam: Pleasant elderly male, moderately built and nourished lying comfortably propped up in bed this morning undergoing transthoracic echocardiogram at bedside. Respiratory system: Slightly  diminished breath sounds in the bases with occasional basal crackles but otherwise clear to auscultation. Respiratory effort normal. Cardiovascular system: S1 & S2 heard, RRR. No JVD, murmurs, rubs, gallops or clicks. No pedal edema.  Telemetry personally reviewed: Sinus rhythm with frequent PACs. Gastrointestinal system: Abdomen is nondistended, soft and nontender. No organomegaly or masses felt. Normal bowel sounds heard. Central nervous system: Alert and oriented. No focal neurological deficits. Extremities: Symmetric 5 x 5 power.  Hard of hearing. Skin: No rashes, lesions or ulcers Psychiatry: Judgement and insight appear normal. Mood & affect appropriate.     Data Reviewed: I have personally reviewed following labs and imaging studies  CBC: Recent Labs  Lab 11/02/18 0827 11/02/18 1614 11/02/18 2357 11/03/18 0210 11/03/18 0816 11/03/18 1618  WBC 12.0* 10.1  --  8.5 7.9 7.9  NEUTROABS  --  8.4*  --   --   --   --   HGB 6.6* 7.4* 8.2* 8.0* 8.3* 8.1*  HCT 23.2* 23.7* 24.9* 24.9* 24.9* 24.6*  MCV 106.9* 100.0  --  95.8 94.7 95.7  PLT 323 249  --  208 193 829   Basic Metabolic Panel: Recent Labs  Lab 11/02/18 0827 11/02/18 1527 11/03/18 0816  NA 140 141 138  K 3.6  4.1 3.6  CL 96* 102 103  CO2 11* 21* 24  GLUCOSE 143* 120* 90  BUN 23 23 20   CREATININE 1.14 0.88 0.54*  CALCIUM 9.3 8.6* 8.0*  MG 2.0 1.5*  --    Liver Function Tests: Recent Labs  Lab 11/02/18 0827 11/02/18 1527  AST 69* 239*  ALT 21 82*  ALKPHOS 120 131*  BILITOT 1.2 1.2  PROT 6.8 6.3*  ALBUMIN 4.2 4.1   Coagulation Profile: Recent Labs  Lab 11/02/18 0830  INR 1.7*   Cardiac Enzymes: Recent Labs  Lab 11/02/18 0827  CKTOTAL 24*   HbA1C: Recent Labs    11/03/18 0210  HGBA1C 5.0   CBG: Recent Labs  Lab 11/02/18 0828 11/03/18 0739  GLUCAP 119* 86    Recent Results (from the past 240 hour(s))  SARS Coronavirus 2 (CEPHEID- Performed in [place] hospital lab), Hosp Order      Status: None   Collection Time: 11/02/18  8:27 AM   Specimen: Nasopharyngeal Swab  Result Value Ref Range Status   SARS Coronavirus 2 NEGATIVE NEGATIVE Final    Comment: (NOTE) If result is NEGATIVE SARS-CoV-2 target nucleic acids are NOT DETECTED. The SARS-CoV-2 RNA is generally detectable in upper and lower  respiratory specimens during the acute phase of infection. The lowest  concentration of SARS-CoV-2 viral copies this assay can detect is 250  copies / mL. A negative result does not preclude SARS-CoV-2 infection  and should not be used as the sole basis for treatment or other  patient management decisions.  A negative result may occur with  improper specimen collection / handling, submission of specimen other  than nasopharyngeal swab, presence of viral mutation(s) within the  areas targeted by this assay, and inadequate number of viral copies  (<250 copies / mL). A negative result must be combined with clinical  observations, patient history, and epidemiological information. If result is POSITIVE SARS-CoV-2 target nucleic acids are DETECTED. The SARS-CoV-2 RNA is generally detectable in upper and lower  respiratory specimens dur ing the acute phase of infection.  Positive  results are indicative of active infection with SARS-CoV-2.  Clinical  correlation with patient history and other diagnostic information is  necessary to determine patient infection status.  Positive results do  not rule out bacterial infection or co-infection with other viruses. If result is PRESUMPTIVE POSTIVE SARS-CoV-2 nucleic acids MAY BE PRESENT.   A presumptive positive result was obtained on the submitted specimen  and confirmed on repeat testing.  While 2019 novel coronavirus  (SARS-CoV-2) nucleic acids may be present in the submitted sample  additional confirmatory testing may be necessary for epidemiological  and / or clinical management purposes  to differentiate between  SARS-CoV-2 and other  Sarbecovirus currently known to infect humans.  If clinically indicated additional testing with an alternate test  methodology [phone]) is advised. The SARS-CoV-2 RNA is generally  detectable in upper and lower respiratory sp ecimens during the acute  phase of infection. The expected result is Negative. Fact Sheet for Patients:  StrictlyIdeas.no Fact Sheet for Healthcare Providers: BankingDealers.co.za This test is not yet approved or cleared by the [place] FDA and has been authorized for detection and/or diagnosis of SARS-CoV-2 by FDA under an Emergency Use Authorization (EUA).  This EUA will remain in effect (meaning this test can be used) for the duration of the COVID-19 declaration under Section 564(b)(1) of the Act, 21 U.S.C. section 360bbb-3(b)(1), unless the authorization is terminated or revoked sooner. Performed at  [facility], [place] [address] [place], [place] [address]   Blood culture (routine x 2)     Status: None (Preliminary result)   Collection Time: 11/02/18  8:30 AM   Specimen: BLOOD  Result Value Ref Range Status   Specimen Description   Final    BLOOD BLOOD RIGHT FOREARM Performed at [place] [address], [place], [place] [address]    Special Requests   Final    BOTTLES DRAWN AEROBIC AND ANAEROBIC Blood Culture adequate volume Performed at [place] [address] [place], [place] [address]    Culture   Final    NO GROWTH < 24 HOURS Performed at [place] [address], [place], [place] [address]    Report Status PENDING  Incomplete  Urine culture     Status: None   Collection Time: 11/02/18 12:00 PM   Specimen: Urine, Clean Catch  Result Value Ref Range Status   Specimen Description   Final    URINE, CLEAN CATCH Performed at [facility], [place] [address] [place], [place] [address]    Special Requests    Final    NONE Performed at [facility], [place] [address], [place], [place] [address]    Culture   Final    NO GROWTH Performed at [facility], [place] [address], [place], [place] [address]    Report Status 11/03/2018 FINAL  Final  MRSA PCR Screening     Status: None   Collection Time: 11/02/18 12:05 PM   Specimen: Nasopharyngeal  Result Value Ref Range Status   MRSA by PCR NEGATIVE NEGATIVE Final    Comment:        The GeneXpert MRSA Assay (FDA approved for NASAL specimens only), is one component of a comprehensive MRSA colonization surveillance program. It is not intended to diagnose MRSA infection nor to guide or monitor treatment for MRSA infections. Performed at [facility], [place] [address] [place], [place] [address]   Blood culture (routine x 2)     Status: None (Preliminary result)   Collection Time: 11/02/18  3:27 PM   Specimen: BLOOD  Result Value Ref Range Status   Specimen Description   Final    BLOOD LEFT ARM Performed at [place] [address] [place], [place] [address]    Special Requests   Final    BOTTLES DRAWN AEROBIC ONLY Blood Culture adequate volume Performed at [place] [address], [place], [place] [address]    Culture   Final    NO GROWTH < 24 HOURS Performed at [place] [address], [place], [place] [address]    Report Status PENDING  Incomplete         Radiology Studies: X-ray Chest Pa And Lateral  Result Date: 11/03/2018 CLINICAL DATA:  Sepsis EXAM: CHEST - 2 VIEW COMPARISON:  11/02/2018 FINDINGS: Similar cardiomegaly with low lung volumes and basilar densities, suspect atelectasis. On the lateral view, there are small posterior layering effusions. No definite edema pattern or CHF. No pneumothorax. Trachea midline. Aorta atherosclerotic. Degenerative changes of the spine. Diffuse osteopenia noted. IMPRESSION: Mild cardiomegaly without  CHF Slight increased bibasilar atelectasis and small posterior layering effusions. Electronically Signed   By: Jerilynn Mages.  Shick M.D.   On: 11/03/2018 13:46   Ct Abdomen Pelvis W Contrast  Result Date: 11/02/2018 CLINICAL DATA:  Vomiting.  Abdominal pain. EXAM: CT ABDOMEN  AND PELVIS WITH CONTRAST TECHNIQUE: Multidetector CT imaging of the abdomen and pelvis was performed using the standard protocol following bolus administration of intravenous contrast. CONTRAST:  168m OMNIPAQUE IOHEXOL 300 MG/ML  SOLN COMPARISON:  CT scan of October 16, 2018. FINDINGS: Lower chest: Small right pleural effusion is noted with adjacent right lower lobe atelectasis or pneumonia. Hepatobiliary: Innumerable malignant or metastatic lesions are seen involving both hepatic lobes, but most predominantly the left hepatic lobe and anterior segment of right hepatic lobe. Index lesion is noted anterior portion of right hepatic lobe that measures 8.7 x 8.8 cm, which is not significantly changed compared to prior exam. No biliary dilatation or cholelithiasis is noted. Pancreas: Unremarkable. No pancreatic ductal dilatation or surrounding inflammatory changes. Spleen: Normal in size without focal abnormality. Adrenals/Urinary Tract: Adrenal glands are unremarkable. Kidneys are normal, without renal calculi, focal lesion, or hydronephrosis. Bladder is unremarkable. Stomach/Bowel: Stomach is within normal limits. Appendix appears normal. No evidence of bowel wall thickening, distention, or inflammatory changes. Vascular/Lymphatic: Aortic atherosclerosis. 16 mm aortocaval lymph node is noted which is not significantly changed compared to prior exam. Reproductive: Prostate is unremarkable. Other: Moderate ascites is noted.  No hernia is noted. Musculoskeletal: No acute or significant osseous findings. IMPRESSION: Small right pleural effusion is noted with adjacent right lower lobe atelectasis or pneumonia. Stable diffuse hepatic lesions are noted consistent  with metastatic disease or malignancy. Stable 16 mm aortocaval lymph node. Moderate ascites. Aortic Atherosclerosis (ICD10-I70.0). Electronically Signed   By: JMarijo ConceptionM.D.   On: 11/02/2018 19:32   Dg Chest Portable 1 View  Result Date: 11/02/2018 CLINICAL DATA:  Diarrhea, weakness, history of liver cancer EXAM: PORTABLE CHEST 1 VIEW COMPARISON:  08/11/2018 FINDINGS: Trace right pleural effusion. Mild right basilar atelectasis. No pneumothorax. Stable cardiomediastinal silhouette. No aggressive osseous lesion. IMPRESSION: Trace right pleural effusion with atelectasis. Electronically Signed   By: HKathreen Devoid  On: 11/02/2018 10:17   Dg Hip Unilat With Pelvis 1v Left  Result Date: 11/02/2018 CLINICAL DATA:  Left hip replacement EXAM: DG HIP (WITH OR WITHOUT PELVIS) 1V*L* COMPARISON:  No comparison. FINDINGS: Bones are diffusely demineralized. Patient is status post left total hip arthroplasty. Degenerative changes noted in the right hip. IMPRESSION: No acute findings. Electronically Signed   By: EMisty StanleyM.D.   On: 11/02/2018 18:51        Scheduled Meds: . Chlorhexidine Gluconate Cloth  6 each Topical Daily  . iohexol  30 mL Oral Once  . levothyroxine  50 mcg Oral QAC breakfast  . mouth rinse  15 mL Mouth Rinse BID  . metoprolol tartrate  25 mg Oral Q6H  . mirtazapine  7.5 mg Oral QHS  . multivitamin with minerals  1 tablet Oral Daily  . pantoprazole (PROTONIX) IV  40 mg Intravenous Q12H  . sucralfate  1 g Oral TID WC & HS  . vitamin C  250 mg Oral Daily   Continuous Infusions: . sodium chloride 50 mL/hr at 11/03/18 1222  . ceFEPime (MAXIPIME) IV Stopped (11/03/18 0905)  . diltiazem (CARDIZEM) infusion Stopped (11/03/18 1335)  . vancomycin Stopped (11/03/18 1333)     LOS: 1 day     AVernell Leep MD, FACP, [facility]  To contact the attending provider between 7A-7P or the covering provider during after hours 7P-7A, please log into the web site  [email or web address] and access using universal [place] password for that web site. If you do not have the password, please call the  hospital operator.  11/03/2018, 4:58 PM

## 2018-11-03 NOTE — Progress Notes (Signed)
  Echocardiogram 2D Echocardiogram has been performed.  Antonio Manning M 11/03/2018, 8:22 AM

## 2018-11-03 NOTE — Evaluation (Signed)
Occupational Therapy Evaluation Patient Details Name: Antonio Manning MRN: [id number] DOB: 1940-12-18 Today's Date: 11/03/2018    History of Present Illness 78 year old man admitted wtih weakness and coffee ground emesis.  H/O [place] on chemo, CVA with expressive aphasia, PAF, and hearing loss   Clinical Impression   Pt was admitted for the above.  He reports that he is mod I with adls and ambuating at baseline. He uses a RW.  Pt was limited by HR during OT evaluation.  He was able to get to chair with min A, +2 safety for multiple lines.  He can normally cross his legs for LB adls, but didn't feel up to trying today. Will follow in acute setting with supervision level goals.    Follow Up Recommendations  Home health OT;Supervision/Assistance - 24 hour    Equipment Recommendations  None recommended by OT    Recommendations for Other Services       Precautions / Restrictions Precautions Precautions: Fall Precaution Comments: watch HR Restrictions Weight Bearing Restrictions: No      Mobility Bed Mobility Overal bed mobility: Needs Assistance Bed Mobility: Supine to Sit     Supine to sit: Min guard     General bed mobility comments: for lines/safety  Transfers Overall transfer level: Needs assistance Equipment used: Rolling walker (2 wheeled) Transfers: Sit to/from Stand Sit to Stand: Min assist;+2 safety/equipment         General transfer comment: light assistance for standing and steadying.  +2 lines    Balance                                           ADL either performed or assessed with clinical judgement   ADL Overall ADL's : Needs assistance/impaired Eating/Feeding: Set up Eating/Feeding Details (indicate cue type and reason): poor appetite Grooming: Set up;Sitting   Upper Body Bathing: Set up   Lower Body Bathing: Moderate assistance   Upper Body Dressing : Set up   Lower Body Dressing: Maximal assistance   Toilet Transfer:  Minimal assistance;+2 for safety/equipment Toilet Transfer Details (indicate cue type and reason): took several steps to chair Toileting- Clothing Manipulation and Hygiene: Minimal assistance         General ADL Comments: pt states that he usually crosses his legs for ADLs and might try that in a couple of days. Limited by HR     Vision   Additional Comments: h/o cataracts     Perception     Praxis      Pertinent Vitals/Pain Pain Assessment: No/denies pain     Hand Dominance     Extremity/Trunk Assessment Upper Extremity Assessment Upper Extremity Assessment: Overall WFL for tasks assessed           Communication Communication Communication: (per chart, expressive difficulties)   Cognition Arousal/Alertness: Awake/alert Behavior During Therapy: WFL for tasks assessed/performed Overall Cognitive Status: Within Functional Limits for tasks assessed                                     General Comments  HR up to 128; parameters set below 120.  On 5 liters, sats remained in 90s. Doesn't use 02 at baseline    Exercises     Shoulder Instructions      Home Living Family/patient expects to  be discharged to:: Private residence Living Arrangements: Spouse/significant other Available Help at Discharge: Family Type of Home: House Home Access: Stairs to enter Technical brewer of Steps: 2 Entrance Stairs-Rails: None       Bathroom Shower/Tub: Teacher, early years/pre: Standard     Home Equipment: Bedside commode;Shower seat;Walker - 2 wheels          Prior Functioning/Environment Level of Independence: Independent        Comments: reports 3 of the best children        OT Problem List: Decreased strength;Decreased activity tolerance;Impaired balance (sitting and/or standing);Cardiopulmonary status limiting activity      OT Treatment/Interventions: Self-care/ADL training;Energy conservation;DME and/or AE  instruction;Patient/family education;Balance training;Therapeutic activities    OT Goals(Current goals can be found in the care plan section) Acute Rehab OT Goals Patient Stated Goal: home OT Goal Formulation: With patient Time For Goal Achievement: 11/17/18 Potential to Achieve Goals: Good ADL Goals Pt Will Transfer to Toilet: with supervision;ambulating;bedside commode Pt Will Perform Toileting - Clothing Manipulation and hygiene: with supervision;sit to/from stand Additional ADL Goal #1: pt will perform adl with set up/supervision and initiate at least one rest break for energy conservation  OT Frequency: Min 2X/week   Barriers to D/C:            Co-evaluation PT/OT/SLP Co-Evaluation/Treatment: Yes Reason for Co-Treatment: For patient/therapist safety PT goals addressed during session: Mobility/safety with mobility OT goals addressed during session: ADL's and self-care      AM-PAC OT "6 Clicks" Daily Activity     Outcome Measure Help from another person eating meals?: A Little Help from another person taking care of personal grooming?: A Little Help from another person toileting, which includes using toliet, bedpan, or urinal?: A Little Help from another person bathing (including washing, rinsing, drying)?: A Lot Help from another person to put on and taking off regular upper body clothing?: A Little Help from another person to put on and taking off regular lower body clothing?: A Lot 6 Click Score: 16   End of Session Nurse Communication: Mobility status  Activity Tolerance: (limited by HR) Patient left: in chair;with call bell/phone within reach;with chair alarm set  OT Visit Diagnosis: Muscle weakness (generalized) (M62.81)                Time: 7342-8768 OT Time Calculation (min): 31 min Charges:  OT General Charges $OT Visit: 1 Visit OT Evaluation $OT Eval Low Complexity: [place], OTR/L Acute Rehabilitation Services [phone] [facility]  pager [phone] office 11/03/2018  [place] 11/03/2018, 3:53 PM

## 2018-11-03 NOTE — Progress Notes (Signed)
Pharmacy Antibiotic Note  Antonio Manning is a 78 y.o. male admitted on 11/02/2018 with sepsis.  Pharmacy has been consulted for ampicillin/sulbactam dosing.  Pt current on broad spectrum antibiotics of cefepime and vancomycin. Antibiotics being changed to ampicillin/sulbactam for treatment of aspiration PNA. No antibiotic allergies.   Today, 11/03/18  WBC 7.9 - WNL  SCr - WNL, CrCl ~70 mL/min  Afebrile  Plan:  Ampicillin/sulbactam 3 g IV q6h  Renal function has been stable. Pharmacy to sign off. Please re-consult if needed.   Height: 6' (182.9 cm) Weight: 154 lb 5.2 oz (70 kg) IBW/kg (Calculated) : 77.6  Temp (24hrs), Avg:98 F (36.7 C), Min:97.4 F (36.3 C), Max:98.7 F (37.1 C)  Recent Labs  Lab 11/02/18 0827 11/02/18 1020 11/02/18 1527 11/02/18 1614 11/03/18 0210 11/03/18 0816 11/03/18 1139 11/03/18 1618  WBC 12.0*  --   --  10.1 8.5 7.9  --  7.9  CREATININE 1.14  --  0.88  --   --  0.54*  --   --   LATICACIDVEN >11.0* >11.0* 8.8*  --   --  1.7 1.5  --     Estimated Creatinine Clearance: 72.9 mL/min (A) (by C-G formula based on SCr of 0.54 mg/dL (L)).    Allergies  Allergen Reactions  . Codeine Nausea And Vomiting    Antimicrobials this admission: Ampicillin/sulbactam 7/3 >>  Vancomycin/cefepime 7/2 >> 7/3  Dose adjustments this admission:  Microbiology results: 7/2 BCx: ngtd 7/2 UCx: NGF  7/2 MRSA PCR: Negative  Thank you for allowing pharmacy to be a part of this patient's care.  Lenis Noon, PharmD 11/03/2018 5:41 PM

## 2018-11-03 NOTE — Progress Notes (Signed)
OT Cancellation Note  Patient Details Name: Antonio Manning MRN: [id number] DOB: Sep 18, 1940   Cancelled Treatment:    Reason Eval/Treat Not Completed: Patient at procedure or test/ unavailable. Will try to check back later.  Antonio Manning 11/03/2018, 11:58 AM  Lesle Chris, OTR/L Acute Rehabilitation Services [phone] [facility] pager [phone] office 11/03/2018

## 2018-11-03 NOTE — Progress Notes (Addendum)
Spoke to patients wife about patient status. All questions answered and notified RN will follow up with new developments.   Wife did mention that patient had areas on buttocks and back with skin breakdown. Mentioned she has been doing wound care bid on patient. States wounds have started to scab up and heal. RN assessed thoroughly the crevices of bottom and back no areas noted to be broken down or scabbed over.

## 2018-11-03 NOTE — Progress Notes (Signed)
Progress Note  Patient Name: Antonio Manning Date of Encounter: 11/03/2018  Primary Cardiologist: Sanda Klein, MD   Subjective   Patient just back from endoscopy   Denies CP   Breathing OK Inpatient Medications    Scheduled Meds: . sodium chloride   Intravenous Once  . sodium chloride   Intravenous Once  . Chlorhexidine Gluconate Cloth  6 each Topical Daily  . iohexol  30 mL Oral Once  . levothyroxine  50 mcg Oral QAC breakfast  . mouth rinse  15 mL Mouth Rinse BID  . mirtazapine  7.5 mg Oral QHS  . multivitamin with minerals  1 tablet Oral Daily  . vitamin C  250 mg Oral Daily   Continuous Infusions: . sodium chloride 75 mL/hr at 11/03/18 0400  . ceFEPime (MAXIPIME) IV Stopped (11/02/18 2249)  . diltiazem (CARDIZEM) infusion 5 mg/hr (11/03/18 0400)  . octreotide  (SANDOSTATIN)    IV infusion 50 mcg/hr (11/03/18 0400)  . pantoprozole (PROTONIX) infusion 8 mg/hr (11/03/18 0400)  . vancomycin     PRN Meds: acetaminophen **OR** acetaminophen, albuterol, ondansetron **OR** ondansetron (ZOFRAN) IV, traMADol   Vital Signs    Vitals:   11/03/18 0200 11/03/18 0400 11/03/18 0500 11/03/18 0600  BP: (!) 162/88 (!) 165/103 (!) 169/85 (!) 160/106  Pulse: (!) 58 68 (!) 47 71  Resp: 11 13 12 12   Temp:  97.7 F (36.5 C)    TempSrc:  Oral    SpO2: 100% 100% 99% 100%  Weight:   70 kg   Height:        Intake/Output Summary (Last 24 hours) at 11/03/2018 0741 Last data filed at 11/03/2018 0600 Gross per 24 hour  Intake 2480 ml  Output 978 ml  Net 1502 ml   Last 3 Weights 11/03/2018 11/02/2018 11/02/2018  Weight (lbs) 154 lb 5.2 oz 148 lb 9.4 oz 148 lb  Weight (kg) 70 kg 67.4 kg 67.132 kg      Telemetry     SR with PACs- Personally Reviewed  ECG    Ordered  Physical Exam   GEN: No acute distress.   Neck: No JVD Cardiac: RRR with skip, no murmurs, rubs, or gallops.  Respiratory: Clear to auscultation bilaterally. GI: Soft, nontender, non-distended  MS: No edema; No  deformity. Neuro:  Nonfocal  Psych: Normal affect   Labs    High Sensitivity Troponin:   Recent Labs  Lab 11/02/18 1020 11/02/18 1527 11/02/18 1805  TROPONINIHS 10.0 25.0* 36.0*      Cardiac EnzymesNo results for input(s): TROPONINI in the last 168 hours. No results for input(s): TROPIPOC in the last 168 hours.   Chemistry Recent Labs  Lab 11/02/18 0827 11/02/18 1527  NA 140 141  K 3.6 4.1  CL 96* 102  CO2 11* 21*  GLUCOSE 143* 120*  BUN 23 23  CREATININE 1.14 0.88  CALCIUM 9.3 8.6*  PROT 6.8 6.3*  ALBUMIN 4.2 4.1  AST 69* 239*  ALT 21 82*  ALKPHOS 120 131*  BILITOT 1.2 1.2  GFRNONAA >60 >60  GFRAA >60 >60  ANIONGAP 33* 18*     Hematology Recent Labs  Lab 11/02/18 0827 11/02/18 1614 11/02/18 2357 11/03/18 0210  WBC 12.0* 10.1  --  8.5  RBC 2.17* 2.37*  --  2.60*  HGB 6.6* 7.4* 8.2* 8.0*  HCT 23.2* 23.7* 24.9* 24.9*  MCV 106.9* 100.0  --  95.8  MCH 30.4 31.2  --  30.8  MCHC 28.4* 31.2  --  32.1  RDW 16.9* 16.0*  --  16.1*  PLT 323 249  --  208    BNPNo results for input(s): BNP, PROBNP in the last 168 hours.   DDimer No results for input(s): DDIMER in the last 168 hours.   Radiology    Ct Abdomen Pelvis W Contrast  Result Date: 11/02/2018 CLINICAL DATA:  Vomiting.  Abdominal pain. EXAM: CT ABDOMEN AND PELVIS WITH CONTRAST TECHNIQUE: Multidetector CT imaging of the abdomen and pelvis was performed using the standard protocol following bolus administration of intravenous contrast. CONTRAST:  18mL OMNIPAQUE IOHEXOL 300 MG/ML  SOLN COMPARISON:  CT scan of October 16, 2018. FINDINGS: Lower chest: Small right pleural effusion is noted with adjacent right lower lobe atelectasis or pneumonia. Hepatobiliary: Innumerable malignant or metastatic lesions are seen involving both hepatic lobes, but most predominantly the left hepatic lobe and anterior segment of right hepatic lobe. Index lesion is noted anterior portion of right hepatic lobe that measures 8.7 x 8.8  cm, which is not significantly changed compared to prior exam. No biliary dilatation or cholelithiasis is noted. Pancreas: Unremarkable. No pancreatic ductal dilatation or surrounding inflammatory changes. Spleen: Normal in size without focal abnormality. Adrenals/Urinary Tract: Adrenal glands are unremarkable. Kidneys are normal, without renal calculi, focal lesion, or hydronephrosis. Bladder is unremarkable. Stomach/Bowel: Stomach is within normal limits. Appendix appears normal. No evidence of bowel wall thickening, distention, or inflammatory changes. Vascular/Lymphatic: Aortic atherosclerosis. 16 mm aortocaval lymph node is noted which is not significantly changed compared to prior exam. Reproductive: Prostate is unremarkable. Other: Moderate ascites is noted.  No hernia is noted. Musculoskeletal: No acute or significant osseous findings. IMPRESSION: Small right pleural effusion is noted with adjacent right lower lobe atelectasis or pneumonia. Stable diffuse hepatic lesions are noted consistent with metastatic disease or malignancy. Stable 16 mm aortocaval lymph node. Moderate ascites. Aortic Atherosclerosis (ICD10-I70.0). Electronically Signed   By: Marijo Conception M.D.   On: 11/02/2018 19:32   Dg Chest Portable 1 View  Result Date: 11/02/2018 CLINICAL DATA:  Diarrhea, weakness, history of liver cancer EXAM: PORTABLE CHEST 1 VIEW COMPARISON:  08/11/2018 FINDINGS: Trace right pleural effusion. Mild right basilar atelectasis. No pneumothorax. Stable cardiomediastinal silhouette. No aggressive osseous lesion. IMPRESSION: Trace right pleural effusion with atelectasis. Electronically Signed   By: Kathreen Devoid   On: 11/02/2018 10:17   Dg Hip Unilat With Pelvis 1v Left  Result Date: 11/02/2018 CLINICAL DATA:  Left hip replacement EXAM: DG HIP (WITH OR WITHOUT PELVIS) 1V*L* COMPARISON:  No comparison. FINDINGS: Bones are diffusely demineralized. Patient is status post left total hip arthroplasty. Degenerative  changes noted in the right hip. IMPRESSION: No acute findings. Electronically Signed   By: Misty Stanley M.D.   On: 11/02/2018 18:51    Cardiac Studies   Echo 11/02/18 - pending   Echo 12/27/17: Study Conclusions  - Left ventricle: The cavity size was normal. Systolic function was normal. The estimated ejection fraction was in the range of 55% to 60%. Wall motion was normal; there were no regional wall motion abnormalities. Doppler parameters are consistent with abnormal left ventricular relaxation (grade 1 diastolic dysfunction). Doppler parameters are consistent with both elevated ventricular end-diastolic filling pressure and elevated left atrial filling pressure. - Aortic valve: There was trivial regurgitation. - Mitral valve: Mild, late systolicprolapse, involving the posterior leaflet. There was mild regurgitation.   ETT 12/27/17:  Blood pressure demonstrated a normal response to exercise.  There was no ST segment deviation noted during stress.  ETT  with normal exercise tolerance (6:10); no chest pain; normal BP response; no ST changes; negative adequate ETT; Duke treadmill score 6.   Patient Profile     ALF DOYLE is a 78 y.o. male with a hx of cryptogenic stroke and subsequent diagnosis of atrial fibrillation with low burden by ILR (Dr. Sallyanne Kuster), ETT negative for ischemia (2019), and chronic diastolic heart failure  who is being seen today for the evaluation of Afib RVR in the setting of GI bleed at the request of Dr. Alfredia Ferguson.   Assessment & Plan    1  Atrial fibrillation   Patient converted to SR   EKG ordered    WIll follow  Eliquis is on hold for now   Will resume if OK with GI  Start b blocker  Wean dilt    2  HTN  BP has been elevated   WIll switch to oral meds (metoprolol 25 qid)  and follow (pt to start clear liquids)  3  Hypothyroidism TSH 29 (very elevated)  Fre T4 normal  Will check free T3   Unusual with that degree of elevation    4  GI   Erosive esophagitis on EGD       For questions or updates, please contact [facility] Please consult [email or web address] for contact info under        Signed, Dorris Carnes, MD  11/03/2018, 7:41 AM

## 2018-11-03 NOTE — Progress Notes (Signed)
Palliative Medicine Team consult was received.   Chart reviewed.  Attempted to see patient, but of the floor for procedure at time of visit.  Will attempt to revisit later this afternoon.  If there are urgent needs or questions please call [phone]. Thank you for consulting out team to assist with this patients care.  Micheline Rough, MD [facility] [phone]

## 2018-11-03 NOTE — Anesthesia Postprocedure Evaluation (Signed)
Anesthesia Post Note  Patient: Antonio Manning  Procedure(s) Performed: ESOPHAGOGASTRODUODENOSCOPY (EGD) WITH PROPOFOL (N/A ) BIOPSY     Patient location during evaluation: Endoscopy Anesthesia Type: MAC Level of consciousness: awake and alert Pain management: pain level controlled Vital Signs Assessment: post-procedure vital signs reviewed and stable Respiratory status: spontaneous breathing, nonlabored ventilation, respiratory function stable and patient connected to nasal cannula oxygen Cardiovascular status: blood pressure returned to baseline and stable Postop Assessment: no apparent nausea or vomiting Anesthetic complications: no    Last Vitals:  Vitals:   11/03/18 0954 11/03/18 1000  BP: (!) 145/84 (!) 149/87  Pulse: 91 85  Resp: 15 15  Temp: 36.7 C   SpO2: 96% 95%    Last Pain:  Vitals:   11/03/18 0954  TempSrc: Oral  PainSc:                  Altus Zaino DANIEL

## 2018-11-03 NOTE — Evaluation (Signed)
Physical Therapy Evaluation Patient Details Name: Antonio Manning MRN: [id number] DOB: [birth year] Today's Date: 11/03/2018   History of Present Illness  78 year old man admitted wtih weakness and coffee ground emesis.  H/O [place] on chemo, CVA with expressive aphasia, PAF, and hearing loss. Pt with L THA 2013, pt reports fall and "dislocating hip" but imaging obtained 7/2 WNL.  Clinical Impression  Pt presents with LE weakness, L hip pain with mobility, increased time and effort to perform mobility tasks, unsteadiness in dynamic standing, and decreased activity tolerance due dyspnea, fatigue, and tachycardia up to 130 bpm. Pt to benefit from acute PT to address deficits. Pt ambulated 5 ft to recliner at bedside, unable to ambulate further due to tachycardia and dyspnea. PT expects pt to progress well with mobility, recommending home with family with HHPT, as pt states family is very supportive and helpful. PT to progress mobility as tolerated, and will continue to follow acutely.      Follow Up Recommendations Home health PT;Supervision/Assistance - 24 hour    Equipment Recommendations  None recommended by PT    Recommendations for Other Services       Precautions / Restrictions Precautions Precautions: Fall Precaution Comments: watch HR Restrictions Weight Bearing Restrictions: No      Mobility  Bed Mobility Overal bed mobility: Needs Assistance Bed Mobility: Supine to Sit     Supine to sit: Min guard     General bed mobility comments: for lines/safety, increased time to scoot to EOB with use of UE support.  Transfers Overall transfer level: Needs assistance Equipment used: Rolling walker (2 wheeled) Transfers: Sit to/from Stand Sit to Stand: Min assist;+2 safety/equipment         General transfer comment: light assistance for standing and steadying.  +2 lines  Ambulation/Gait Ambulation/Gait assistance: Min assist Gait Distance (Feet): 5 Feet Assistive device: Rolling  walker (2 wheeled) Gait Pattern/deviations: Step-through pattern;Decreased stride length Gait velocity: decr   General Gait Details: Min assist for steadying, guiding pt to recliner. HR up to 130 bpm during ambulation, sats not reading accurately but pt with DOE 2/4 with mobility.  Stairs            Wheelchair Mobility    Modified Rankin (Stroke Patients Only)       Balance Overall balance assessment: Needs assistance;History of Falls(reports 2 falls in the past year) Sitting-balance support: No upper extremity supported Sitting balance-Leahy Scale: Fair     Standing balance support: Bilateral upper extremity supported Standing balance-Leahy Scale: Poor Standing balance comment: reliant on UE support                             Pertinent Vitals/Pain Pain Assessment: Faces Faces Pain Scale: Hurts a little bit Pain Location: L hip with mobility Pain Descriptors / Indicators: Sore Pain Intervention(s): Limited activity within patient's tolerance;Monitored during session;Repositioned    Home Living Family/patient expects to be discharged to:: Private residence Living Arrangements: Spouse/significant other Available Help at Discharge: Family Type of Home: House Home Access: Stairs to enter Entrance Stairs-Rails: None Entrance Stairs-Number of Steps: 2   Home Equipment: Bedside commode;Shower seat;Walker - 2 wheels      Prior Function Level of Independence: Independent with assistive device(s)         Comments: reports 3 of the best children and wife, states he was using RW and needing some assist with ADLs PTA     Hand Dominance  Dominant Hand: Right    Extremity/Trunk Assessment   Upper Extremity Assessment Upper Extremity Assessment: Overall WFL for tasks assessed(per OT note)    Lower Extremity Assessment Lower Extremity Assessment: LLE deficits/detail;RLE deficits/detail RLE Deficits / Details: at least 3/5 hip flexion/extension, knee  flexion/extension, PF/DF LLE Deficits / Details: at least 3/5 hip flexion/extension, knee flexion/extension, PF/DF, but requires increased time to perform AROM due to L hip pain       Communication   Communication: HOH(per chart, expressive difficulties)  Cognition Arousal/Alertness: Awake/alert Behavior During Therapy: WFL for tasks assessed/performed Overall Cognitive Status: Within Functional Limits for tasks assessed                                        General Comments General comments (skin integrity, edema, etc.): HR up to 128; parameters set below 120.  On 5 liters, sats remained in 90s. Doesn't use 02 at baseline    Exercises     Assessment/Plan    PT Assessment Patient needs continued PT services  PT Problem List Decreased strength;Decreased mobility;Decreased activity tolerance;Decreased balance;Decreased knowledge of use of DME;Pain       PT Treatment Interventions DME instruction;Therapeutic activities;Gait training;Therapeutic exercise;Patient/family education;Balance training;Stair training;Functional mobility training    PT Goals (Current goals can be found in the Care Plan section)  Acute Rehab PT Goals Patient Stated Goal: home PT Goal Formulation: With patient Time For Goal Achievement: 11/17/18 Potential to Achieve Goals: Good    Frequency Min 3X/week   Barriers to discharge        Co-evaluation   Reason for Co-Treatment: For patient/therapist safety PT goals addressed during session: Mobility/safety with mobility OT goals addressed during session: ADL's and self-care       AM-PAC PT "6 Clicks" Mobility  Outcome Measure Help needed turning from your back to your side while in a flat bed without using bedrails?: A Little Help needed moving from lying on your back to sitting on the side of a flat bed without using bedrails?: A Little Help needed moving to and from a bed to a chair (including a wheelchair)?: A Little Help needed  standing up from a chair using your arms (e.g., wheelchair or bedside chair)?: A Little Help needed to walk in hospital room?: A Little Help needed climbing 3-5 steps with a railing? : A Lot 6 Click Score: 17    End of Session Equipment Utilized During Treatment: Gait belt;Oxygen Activity Tolerance: Patient limited by fatigue;Treatment limited secondary to medical complications (Comment)(tachycardia) Patient left: in chair;with call bell/phone within reach;with chair alarm set Nurse Communication: Mobility status PT Visit Diagnosis: Other abnormalities of gait and mobility (R26.89);Difficulty in walking, not elsewhere classified (R26.2)    Time: 2956-2130 PT Time Calculation (min) (ACUTE ONLY): 31 min   Charges:   PT Evaluation $PT Eval Low Complexity: 1 Low         Bowie Doiron Terrial Rhodes, PT Acute Rehabilitation Services Pager [phone]  Office [phone]   Symone Cornman D Despina Hidden 11/03/2018, 4:22 PM

## 2018-11-03 NOTE — Transfer of Care (Signed)
Immediate Anesthesia Transfer of Care Note  Patient: Antonio Manning  Procedure(s) Performed: ESOPHAGOGASTRODUODENOSCOPY (EGD) WITH PROPOFOL (N/A ) BIOPSY  Patient Location: PACU and Endoscopy Unit  Anesthesia Type:MAC  Level of Consciousness: awake, oriented and patient cooperative  Airway & Oxygen Therapy: Patient Spontanous Breathing and Patient connected to nasal cannula oxygen  Post-op Assessment: Report given to RN and Post -op Vital signs reviewed and stable  Post vital signs: Reviewed and stable  Last Vitals:  Vitals Value Taken Time  BP    Temp    Pulse 86 11/03/18 0954  Resp 15 11/03/18 0954  SpO2 96 % 11/03/18 0954  Vitals shown include unvalidated device data.  Last Pain:  Vitals:   11/03/18 0904  TempSrc: Oral  PainSc: 4          Complications: No apparent anesthesia complications

## 2018-11-03 NOTE — Progress Notes (Signed)
[facility] Gastroenterology Progress Note  Antonio Manning 78 y.o. 1940-05-09  CC: Symptomatic anemia, coffee-ground emesis   Subjective: Had another coffee-ground emesis yesterday.  No bowel movement since admission.  Continues to have mild abdominal discomfort.  CT abdomen pelvis with IV contrast negative yesterday.   ROS : Afebrile.  Negative for chest pain and acute shortness of breath.  Objective: Vital signs in last 24 hours: Vitals:   11/03/18 0500 11/03/18 0600  BP: (!) 169/85 (!) 160/106  Pulse: (!) 47 71  Resp: 12 12  Temp:    SpO2: 99% 100%    Physical Exam:  General:   Elderly patient, not in acute distress  Head:  Normocephalic, without obvious abnormality, atraumatic  Eyes:  , EOM's intact,   Lungs:    Bibasilar crackles.  No respiratory distress  Heart:  Regular rate and rhythm, S1, S2 normal  Abdomen:    Generalized discomfort, mild distended, bowel sounds present, no peritoneal signs,   Extremities: Extremities normal, atraumatic, no  edema       Lab Results: Recent Labs    11/02/18 0827 11/02/18 1527 11/03/18 0816  NA 140 141 138  K 3.6 4.1 3.6  CL 96* 102 103  CO2 11* 21* 24  GLUCOSE 143* 120* 90  BUN 23 23 20   CREATININE 1.14 0.88 0.54*  CALCIUM 9.3 8.6* 8.0*  MG 2.0 1.5*  --    Recent Labs    11/02/18 0827 11/02/18 1527  AST 69* 239*  ALT 21 82*  ALKPHOS 120 131*  BILITOT 1.2 1.2  PROT 6.8 6.3*  ALBUMIN 4.2 4.1   Recent Labs    11/02/18 1614  11/03/18 0210 11/03/18 0816  WBC 10.1  --  8.5 7.9  NEUTROABS 8.4*  --   --   --   HGB 7.4*   < > 8.0* 8.3*  HCT 23.7*   < > 24.9* 24.9*  MCV 100.0  --  95.8 94.7  PLT 249  --  208 193   < > = values in this interval not displayed.   Recent Labs    11/02/18 0830  LABPROT 20.1*  INR 1.7*      Assessment/Plan: -Coffee-ground emesis with 4 g drop in hemoglobin compared to last week.  CT scan of abdomen on October 16, 2018 showed large tumor burden with significant mass-effect of left lobe  of liver upon the extrahepatic portal vein without any definite evidence of thrombosis. -Acute blood loss anemia.  Status post blood transfusion.  Hemoglobin improved to 8.3 now. -Hepatocellular carcinoma with malignant ascites. -Intermittent diarrhea. -A. fib.  Eliquis on hold.  Recommendations --------------------------- -Feeling better compared to yesterday.  Repeat CT negative for acute changes.  Hemoglobin improved with blood transfusion.  Eliquis on hold.  Lactic acid back to normal. -Oncology and cardiology notes reviewed. -Continue current management for now.  EGD today.  Risks (bleeding, infection, bowel perforation that could require surgery, sedation-related changes in cardiopulmonary systems), benefits (identification and possible treatment of source of symptoms, exclusion of certain causes of symptoms), and alternatives (watchful waiting, radiographic imaging studies, empiric medical treatment)  were explained to patient/family in detail and patient wishes to proceed.   Otis Brace MD, [place] 11/03/2018, 9:01 AM  Contact #  [phone]

## 2018-11-03 NOTE — Progress Notes (Signed)
Received notification for CCMD that patient had 6 beat run of Non sustained V Tach. RN assessed patient who asymptomatic and asleep. Notified MD Harrington Challenger thru [employer] and MD [facility] notified.

## 2018-11-04 DIAGNOSIS — E876 Hypokalemia: Secondary | ICD-10-CM

## 2018-11-04 DIAGNOSIS — G9341 Metabolic encephalopathy: Secondary | ICD-10-CM

## 2018-11-04 LAB — CBC
HCT: 22.6 % — ABNORMAL LOW (ref 39.0–52.0)
HCT: 22.7 % — ABNORMAL LOW (ref 39.0–52.0)
Hemoglobin: 7.3 g/dL — ABNORMAL LOW (ref 13.0–17.0)
Hemoglobin: 7.3 g/dL — ABNORMAL LOW (ref 13.0–17.0)
MCH: 30.9 pg (ref 26.0–34.0)
MCH: 31.1 pg (ref 26.0–34.0)
MCHC: 32.3 g/dL (ref 30.0–36.0)
MCHC: 32.2 g/dL (ref 30.0–36.0)
MCV: 95.8 fL (ref 80.0–100.0)
MCV: 96.6 fL (ref 80.0–100.0)
Platelets: 155 10*3/uL (ref 150–400)
Platelets: 160 10*3/uL (ref 150–400)
RBC: 2.36 MIL/uL — ABNORMAL LOW (ref 4.22–5.81)
RBC: 2.35 MIL/uL — ABNORMAL LOW (ref 4.22–5.81)
RDW: 17 % — ABNORMAL HIGH (ref 11.5–15.5)
RDW: 16.8 % — ABNORMAL HIGH (ref 11.5–15.5)
WBC: 5.3 10*3/uL (ref 4.0–10.5)
WBC: 6 10*3/uL (ref 4.0–10.5)
nRBC: 1.1 % — ABNORMAL HIGH (ref 0.0–0.2)
nRBC: 1.2 % — ABNORMAL HIGH (ref 0.0–0.2)

## 2018-11-04 LAB — COMPREHENSIVE METABOLIC PANEL
ALT: 139 U/L — ABNORMAL HIGH (ref 0–44)
AST: 679 U/L — ABNORMAL HIGH (ref 15–41)
Albumin: 3.5 g/dL (ref 3.5–5.0)
Alkaline Phosphatase: 717 U/L — ABNORMAL HIGH (ref 38–126)
Anion gap: 12 (ref 5–15)
BUN: 19 mg/dL (ref 8–23)
CO2: 25 mmol/L (ref 22–32)
Calcium: 7.9 mg/dL — ABNORMAL LOW (ref 8.9–10.3)
Chloride: 101 mmol/L (ref 98–111)
Creatinine, Ser: 0.52 mg/dL — ABNORMAL LOW (ref 0.61–1.24)
GFR calc Af Amer: >60 mL/min (ref >60)
GFR calc non Af Amer: >60 mL/min (ref >60)
Glucose, Bld: 79 mg/dL (ref 70–99)
Potassium: 2.7 mmol/L — CL (ref 3.5–5.1)
Sodium: 138 mmol/L (ref 135–145)
Total Bilirubin: 1.6 mg/dL — ABNORMAL HIGH (ref 0.3–1.2)
Total Protein: 5.7 g/dL — ABNORMAL LOW (ref 6.5–8.1)

## 2018-11-04 LAB — MAGNESIUM: Magnesium: 2.1 mg/dL (ref 1.7–2.4)

## 2018-11-04 LAB — AMMONIA: Ammonia: <9 umol/L — ABNORMAL LOW (ref 9–35)

## 2018-11-04 LAB — T3, FREE: T3, Free: 1.1 pg/mL — ABNORMAL LOW (ref 2.0–4.4)

## 2018-11-04 MED ORDER — POTASSIUM CHLORIDE CRYS ER 20 MEQ PO TBCR
40.0000 meq | EXTENDED_RELEASE_TABLET | ORAL | Status: AC
  Administered 2018-11-04 (×3): 40 meq via ORAL
  Filled 2018-11-04 (×3): qty 2

## 2018-11-04 MED ORDER — METOPROLOL TARTRATE 50 MG PO TABS
75.0000 mg | ORAL_TABLET | Freq: Two times a day (BID) | ORAL | Status: DC
  Administered 2018-11-04 – 2018-11-06 (×5): 75 mg via ORAL
  Filled 2018-11-04: qty 3
  Filled 2018-11-04: qty 1
  Filled 2018-11-04 (×2): qty 3
  Filled 2018-11-04: qty 1

## 2018-11-04 NOTE — Progress Notes (Signed)
Spoke with pt wife, who seemed very concerned that pt has some confusion.  Pt wife had called and spoke with pt GI doctor as well. This RN reinforced pt VSS and let pt spoke with his wife, pt recognized his wife's voice, name and said he was at [facility].  Clearly confusion is intermittent, and informed pt wife we would monitor him closely for any more changes.  Pt wife was very thankful and happy about the call of assurance.  Pt RN will F/U on pt status as needed.

## 2018-11-04 NOTE — Progress Notes (Addendum)
PROGRESS NOTE   Antonio Manning  ZOX:[id number]    DOB: [birth year]    DOA: 11/02/2018  PCP: Hulan Fess, MD   I have briefly reviewed patients previous medical records in [facility].  Chief Complaint  Patient presents with   Weakness    Brief Narrative:  78 year old male with PMH of stage III hepatocellular carcinoma currently on immunotherapy and Avastin, HTN, HLD, hard of hearing, cryptogenic stroke, paroxysmal atrial fibrillation on Eliquis, presented to the ED on 11/02/2018 due to weakness, dyspnea and coffee-ground emesis.  In the ED, patient met sepsis criteria including hypothermia, elevated lactate, tachypnea and tachycardia, initiated on broad-spectrum antibiotics.  He also had acute blood loss anemia/hemoglobin 6.6 due to acute upper GI bleed.  Admitted to stepdown unit for sepsis, acute respiratory failure with hypoxia, elevated lactate, acute upper GI bleed with ABLA and A. fib with RVR.  [facility], cardiology and medical oncology consulted.  Status post EGD 7/3.  Improved.   Assessment & Plan:   Principal Problem:   Sepsis ([place]) Active Problems:   S/P left hip replacement   Fatigue   Hyperlipidemia   History of CVA (cerebrovascular accident)   PAF (paroxysmal atrial fibrillation) (HCC)   Liver lesion   Hepatocellular carcinoma (HCC)   Lactic acidosis   High anion gap metabolic acidosis   Symptomatic anemia   Coffee ground emesis   Hypothyroidism   Atrial fibrillation with RVR (HCC)   Abdominal pain   Left hip pain   Sepsis  Present on admission (hypothermia, tachycardia, leukocytosis and elevated lactate)  Could be related to aspiration during emesis episodes.  CT abdomen shows right lower lobe atelectasis versus pneumonia.  Blood cultures x2: Negative to date.  SARS coronavirus 2: Negative.  Urine culture negative.  MRSA PCR negative.  Procalcitonin 1.01 > 1.36.  Lactate has normalized.  He was treated per sepsis criteria with IV fluids and started on  broad-spectrum IV antibiotics including IV vancomycin and cefepime.  Given concern for possible aspiration pneumonia, negative MRSA PCR, discontinued IV vancomycin and cefepime and changed to IV Unasyn.  At discharge may consider oral Augmentin.  Sepsis physiology resolved.  No high fevers.  Blood cultures continue to remain negative to date.  Continue IV Unasyn for 1 more day then transition to oral Augmentin.  Acute respiratory failure with hypoxia  Secondary to pneumonia/atelectasis.  Seems to have resolved.  Incentive spirometry  Acute upper GI bleed/severe erosive esophagitis and mild gastritis  He was treated supportively with bowel rest/n.p.o., IV Protonix drip, IV octreotide drip.  Interestingly FOBT was negative.  [facility] was consulted and underwent EGD 7/3 with findings as noted below.  Post EGD placed on full liquid diet, octreotide discontinued, Carafate for 2 weeks, continue Protonix for now.  No recurrent emesis.  Has not eaten much post EGD due to confusion.  Advance diet as tolerated.  Discussed with Dr. Alessandra Bevels.  Acute blood loss anemia/symptomatic  Secondary to acute upper GI bleed.  S/p 2 units PRBC and hemoglobin has improved from 6.6 on admission to 8.1 range.  Transfuse as needed for hemoglobin 7 or less.  Eliquis obviously on hold.  In the absence of overt bleeding, hemoglobin has dropped to 7.3.  Follow CBC later this evening and again daily.  Lactic acidosis  Secondary to sepsis, acute upper GI bleed, acute blood loss anemia  Treated as above and resolved.  High anion gap metabolic acidosis  Anion gap 33 on admission.  Resolved.  Hypothyroidism  TSH 29.022, free T4: 0.97.  Checking free T3.  Currently on Synthroid 50 mcg daily.  Discussed with patient regarding compliance and may consider increasing Synthroid dose.  Sinus tachycardia with frequent PACs/history of A. fib.  Cardiology follow-up appreciated.  Patient was placed on IV  Cardizem drip and reverted to sinus rhythm overnight of admission.  Cardizem drip discontinued 7/3  GI okay with resuming Eliquis.  However given severity of recent anemia requiring transfusion and hemoglobin drop from 8.1-7.3, as discussed with Dr. Meda Coffee, will continue to hold Eliquis at least for 1 more day.  Cardiology have changed metoprolol to 75 mg twice daily.  Cardiomyopathy  TTE 7/3: LVEF 45-50%.  No chest pain reported.  Management per cardiology.  Elevated troponin  Likely due to demand ischemia from acute illness discussed above rather than due to ACS.  Essential hypertension  Metoprolol 75 mg twice daily.  Stage III hepatocellular carcinoma  Oncology input appreciated.  Currently on atezolizumab (immunotherapy) and Avastin, last dose 6/24.  LFTs have worsened compared to yesterday.?  Related to anesthesia/antibiotics versus others.  Follow CMP in a.m.  History of CVA  Eliquis currently on hold due to GI bleed.  Resume when hemoglobin stable.  Hyperlipidemia  Continue atorvastatin.  Abdominal pain  Present on admission.  Likely related to esophagitis now seen on EGD.  Stable hepatic metastatic lesions and moderate ascites.  Abdominal pain may be related to his [place].  Left hip pain  X-ray without fractures or acute findings.  Hypomagnesemia  Replaced.  Hypokalemia  Potassium 2.7 today.  Replace aggressively and follow BMP.  Acute toxic metabolic encephalopathy  Overnight 7/3 patient had confusion, agitation and had to be restrained.  DD: Meds (tramadol/Remeron)-now discontinued, EGD procedural anesthesia effect, sundowning, related to infection/pneumonia (less likely).  Very low index of suspicion for a stroke in the absence of focal deficits and improving mental status.  Given worsened LFTs, check ammonia level although hepatic encephalopathy felt less likely.  Had ammonia level of 41 on 7/2.  Better this morning.  Delirium precautions.  As per  spouse, patient was not taking Remeron or tramadol, discontinued both.  Avoid physical restraints.  If he gets agitated again, try to get a Air cabin crew.  Also to call patient's youngest son who can face time the patient and try to reorient him and calm him down.   DVT prophylaxis: SCDs Code Status: Full Family Communication: Discussed with spouse 7/4, updated care and answered questions. Disposition: DC home pending clinical improvement, possibly in the next 1 to 2 days.  Continue to monitor in stepdown unit for additional 24 hours.   Consultants:  [facility] Cardiology Medical Oncology  Procedures:  EGD 3/7  Antimicrobials:  IV cefepime and vancomycin 7/2-7/3 IV Unasyn 7/3 >   Subjective: Overnight events noted.  Confusion and agitation and had to be restrained.  Better this morning.  Oriented x2.  Did not eat much post procedure yesterday.  No vomiting or bleeding reported.  ROS: As above, otherwise negative.  Objective:  Vitals:   11/04/18 0600 11/04/18 0700 11/04/18 0800 11/04/18 0926  BP: (!) 146/95 128/71 139/88 (!) 156/95  Pulse: 95 68 78 85  Resp: _0 Temp:      TempSrc:      SpO2: 100% 100% 99%   Weight:      Height:        Examination:  General exam: Pleasant elderly male, moderately built and nourished lying comfortably supine in bed.  Still had wrist restraints on which were removed in my presence. Respiratory system: Clear to auscultation.  No increased work of breathing. Cardiovascular system: S1 & S2 heard, RRR. No JVD, murmurs, rubs, gallops or clicks. No pedal edema.  Telemetry personally reviewed: Sinus rhythm with few PACs. Gastrointestinal system: Abdomen is nondistended and soft.  Palpable nonpulsatile mass in the epigastric region with associated tenderness which could be his HCC.  Normal bowel sounds heard. Central nervous system: Alert and oriented x2. No focal neurological deficits. Extremities: Symmetric 5 x 5 power.  Hard of  hearing. Skin: No rashes, lesions or ulcers Psychiatry: Judgement and insight appears impaired today. Mood & affect appropriate.     Data Reviewed: I have personally reviewed following labs and imaging studies  CBC: Recent Labs  Lab 11/02/18 1614 11/02/18 2357 11/03/18 0210 11/03/18 0816 11/03/18 1618 11/04/18 0709  WBC 10.1  --  8.5 7.9 7.9 5.3  NEUTROABS 8.4*  --   --   --   --   --   HGB 7.4* 8.2* 8.0* 8.3* 8.1* 7.3*  HCT 23.7* 24.9* 24.9* 24.9* 24.6* 22.6*  MCV 100.0  --  95.8 94.7 95.7 95.8  PLT 249  --  208 193 190 798   Basic Metabolic Panel: Recent Labs  Lab 11/02/18 0827 11/02/18 1527 11/03/18 0816 11/04/18 0709  NA 140 141 138 138  K 3.6 4.1 3.6 2.7*  CL 96* 102 103 101  CO2 11* 21* 24 25  GLUCOSE 143* 120* 90 79  BUN _0 CREATININE 1.14 0.88 0.54* 0.52*  CALCIUM 9.3 8.6* 8.0* 7.9*  MG 2.0 1.5*  --  2.1   Liver Function Tests: Recent Labs  Lab 11/02/18 0827 11/02/18 1527 11/04/18 0709  AST 69* 239* 679*  ALT 21 82* 139*  ALKPHOS 120 131* 717*  BILITOT 1.2 1.2 1.6*  PROT 6.8 6.3* 5.7*  ALBUMIN 4.2 4.1 3.5   Coagulation Profile: Recent Labs  Lab 11/02/18 0830  INR 1.7*   Cardiac Enzymes: Recent Labs  Lab 11/02/18 0827  CKTOTAL 24*   HbA1C: Recent Labs    11/03/18 0210  HGBA1C 5.0   CBG: Recent Labs  Lab 11/02/18 0828 11/03/18 0739  GLUCAP 119* 86    Recent Results (from the past 240 hour(s))  SARS Coronavirus 2 (CEPHEID- Performed in [facility] hospital lab), Hosp Order     Status: None   Collection Time: 11/02/18  8:27 AM   Specimen: Nasopharyngeal Swab  Result Value Ref Range Status   SARS Coronavirus 2 NEGATIVE NEGATIVE Final    Comment: (NOTE) If result is NEGATIVE SARS-CoV-2 target nucleic acids are NOT DETECTED. The SARS-CoV-2 RNA is generally detectable in upper and lower  respiratory specimens during the acute phase of infection. The lowest  concentration of SARS-CoV-2 viral copies this assay can  detect is 250  copies / mL. A negative result does not preclude SARS-CoV-2 infection  and should not be used as the sole basis for treatment or other  patient management decisions.  A negative result may occur with  improper specimen collection / handling, submission of specimen other  than nasopharyngeal swab, presence of viral mutation(s) within the  areas targeted by this assay, and inadequate number of viral copies  (<250 copies / mL). A negative result must be combined with clinical  observations, patient history, and epidemiological information. If result is POSITIVE SARS-CoV-2 target nucleic acids are DETECTED. The SARS-CoV-2 RNA is generally detectable in upper and lower  respiratory specimens dur ing the acute phase of infection.  Positive  results are indicative of active infection with SARS-CoV-2.  Clinical  correlation with patient history and other diagnostic information is  necessary to determine patient infection status.  Positive results do  not rule out bacterial infection or co-infection with other viruses. If result is PRESUMPTIVE POSTIVE SARS-CoV-2 nucleic acids MAY BE PRESENT.   A presumptive positive result was obtained on the submitted specimen  and confirmed on repeat testing.  While 2019 novel coronavirus  (SARS-CoV-2) nucleic acids may be present in the submitted sample  additional confirmatory testing may be necessary for epidemiological  and / or clinical management purposes  to differentiate between  SARS-CoV-2 and other Sarbecovirus currently known to infect humans.  If clinically indicated additional testing with an alternate test  methodology [phone]) is advised. The SARS-CoV-2 RNA is generally  detectable in upper and lower respiratory sp ecimens during the acute  phase of infection. The expected result is Negative. Fact Sheet for Patients:  StrictlyIdeas.no Fact Sheet for Healthcare  Providers: BankingDealers.co.za This test is not yet approved or cleared by the [place] FDA and has been authorized for detection and/or diagnosis of SARS-CoV-2 by FDA under an Emergency Use Authorization (EUA).  This EUA will remain in effect (meaning this test can be used) for the duration of the COVID-19 declaration under Section 564(b)(1) of the Act, 21 U.S.C. section 360bbb-3(b)(1), unless the authorization is terminated or revoked sooner. Performed at [facility], [place] [address], [place] , [place] [address]   Blood culture (routine x 2)     Status: None (Preliminary result)   Collection Time: 11/02/18  8:30 AM   Specimen: BLOOD  Result Value Ref Range Status   Specimen Description   Final    BLOOD BLOOD RIGHT FOREARM Performed at [place] [address] [place], [place] [address]    Special Requests   Final    BOTTLES DRAWN AEROBIC AND ANAEROBIC Blood Culture adequate volume Performed at [place] [address] [place], [place] [address]    Culture   Final    NO GROWTH < 24 HOURS Performed at [place] [address] [place], [place] [address]    Report Status PENDING  Incomplete  Urine culture     Status: None   Collection Time: 11/02/18 12:00 PM   Specimen: Urine, Clean Catch  Result Value Ref Range Status   Specimen Description   Final    URINE, CLEAN CATCH Performed at [facility], [place] [address], [place], [place] [address]    Special Requests   Final    NONE Performed at [facility], [place] [address] [place], [place] [address]    Culture   Final    NO GROWTH Performed at [facility], [place] [address], [place],  [address]    Report Status 11/03/2018 FINAL  Final  MRSA PCR Screening     Status: None   Collection Time: 11/02/18 12:05 PM   Specimen: Nasopharyngeal  Result Value Ref Range Status   MRSA by  PCR NEGATIVE NEGATIVE Final    Comment:        The GeneXpert MRSA Assay (FDA approved for NASAL specimens only), is one component of a comprehensive MRSA colonization surveillance program. It is not intended to diagnose MRSA infection nor to guide or monitor treatment for MRSA infections. Performed at [facility], [place] [address] [place], [place] [address]   Blood culture (routine x 2)     Status: None (Preliminary result)   Collection Time: 11/02/18  3:27 PM   Specimen: BLOOD  Result Value Ref Range Status   Specimen Description   Final    BLOOD LEFT ARM Performed at [place] [address] [place], [place] [address]    Special Requests   Final    BOTTLES DRAWN AEROBIC ONLY Blood Culture adequate volume Performed at [place] [address] [place], [place] [address]    Culture   Final    NO GROWTH < 24 HOURS Performed at [place] [address], [place], [place] [address]    Report Status PENDING  Incomplete         Radiology Studies: X-ray Chest Pa And Lateral  Result Date: 11/03/2018 CLINICAL DATA:  Sepsis EXAM: CHEST - 2 VIEW COMPARISON:  11/02/2018 FINDINGS: Similar cardiomegaly with low lung volumes and basilar densities, suspect atelectasis. On the lateral view, there are small posterior layering effusions. No definite edema pattern or CHF. No pneumothorax. Trachea midline. Aorta atherosclerotic. Degenerative changes of the spine. Diffuse osteopenia noted. IMPRESSION: Mild cardiomegaly without CHF Slight increased bibasilar atelectasis and small posterior layering effusions. Electronically Signed   By: Jerilynn Mages.  Shick M.D.   On: 11/03/2018 13:46   Ct Abdomen Pelvis W Contrast  Result Date: 11/02/2018 CLINICAL DATA:  Vomiting.  Abdominal pain. EXAM: CT ABDOMEN AND PELVIS WITH CONTRAST TECHNIQUE: Multidetector CT imaging of the abdomen and pelvis was performed using the standard protocol following  bolus administration of intravenous contrast. CONTRAST:  114m OMNIPAQUE IOHEXOL 300 MG/ML  SOLN COMPARISON:  CT scan of October 16, 2018. FINDINGS: Lower chest: Small right pleural effusion is noted with adjacent right lower lobe atelectasis or pneumonia. Hepatobiliary: Innumerable malignant or metastatic lesions are seen involving both hepatic lobes, but most predominantly the left hepatic lobe and anterior segment of right hepatic lobe. Index lesion is noted anterior portion of right hepatic lobe that measures 8.7 x 8.8 cm, which is not significantly changed compared to prior exam. No biliary dilatation or cholelithiasis is noted. Pancreas: Unremarkable. No pancreatic ductal dilatation or surrounding inflammatory changes. Spleen: Normal in size without focal abnormality. Adrenals/Urinary Tract: Adrenal glands are unremarkable. Kidneys are normal, without renal calculi, focal lesion, or hydronephrosis. Bladder is unremarkable. Stomach/Bowel: Stomach is within normal limits. Appendix appears normal. No evidence of bowel wall thickening, distention, or inflammatory changes. Vascular/Lymphatic: Aortic atherosclerosis. 16 mm aortocaval lymph node is noted which is not significantly changed compared to prior exam. Reproductive: Prostate is unremarkable. Other: Moderate ascites is noted.  No hernia is noted. Musculoskeletal: No acute or significant osseous findings. IMPRESSION: Small right pleural effusion is noted with adjacent right lower lobe atelectasis or pneumonia. Stable diffuse hepatic lesions are noted consistent with metastatic disease or malignancy. Stable 16 mm aortocaval lymph node. Moderate ascites. Aortic Atherosclerosis (ICD10-I70.0). Electronically Signed   By: JMarijo ConceptionM.D.   On: 11/02/2018 19:32   Dg Hip Unilat With Pelvis 1v Left  Result Date: 11/02/2018 CLINICAL DATA:  Left hip replacement EXAM: DG HIP (WITH OR WITHOUT PELVIS) 1V*L* COMPARISON:  No comparison. FINDINGS: Bones are diffusely  demineralized. Patient is status post left total hip arthroplasty. Degenerative changes noted in the right hip. IMPRESSION: No acute findings. Electronically Signed   By: EMisty StanleyM.D.   On: 11/02/2018 18:51        Scheduled Meds:  Chlorhexidine  Gluconate Cloth  6 each Topical Daily   feeding supplement (ENSURE ENLIVE)  237 mL Oral BID BM   levothyroxine  50 mcg Oral QAC breakfast   mouth rinse  15 mL Mouth Rinse BID   metoprolol tartrate  75 mg Oral BID   mirtazapine  7.5 mg Oral QHS   multivitamin with minerals  1 tablet Oral Daily   pantoprazole (PROTONIX) IV  40 mg Intravenous Q12H   potassium chloride  40 mEq Oral Q4H   sucralfate  1 g Oral TID WC & HS   vitamin C  250 mg Oral Daily   Continuous Infusions:  ampicillin-sulbactam (UNASYN) IV 3 g (11/04/18 1132)     LOS: 2 days     Vernell Leep, MD, FACP, [facility]. [facility]  To contact the attending provider between 7A-7P or the covering provider during after hours 7P-7A, please log into the web site [email or web address] and access using universal [place] password for that web site. If you do not have the password, please call the hospital operator.  11/04/2018, 12:02 PM

## 2018-11-04 NOTE — Progress Notes (Signed)
Spoke with pt wife who was very concerned that pt hasn't called or answered her phone calls since 5:45pm 11/03/18. RN assured wife, pt was resting and Vital Signs are stable. Pt wife continued to call pt phone, Wife called RN to put patient on phone around [place]. Pt heard wife and started to try to climb out of bed, pulled out IV and stated " Im at home, let me get up". This RN reoriented patient and got him back to bed. GI doctor called RN and informed RN that pt wife called him concerned. This RN called charge RN to advise. RN will continue to monitor patient closely.

## 2018-11-04 NOTE — Progress Notes (Signed)
Notified by Manning, Antonio Manning, that patient's son was down at front desk upset in regards to patient possibly being restrained and sedated. Met with Antonio Manning and Antonio Manning at front desk. Explained to Antonio Manning, patient's son, that patient was not restrained at this time and or received any sedative medications per my current knowledge. Patient was confused and disoriented overnight, trying to get of bed and pull out equipment. Per nursing staff overnight, patient was unable to be reoriented and for safety concerns had to be placed in restraints. Antonio Manning was not happy with the fact that him or his family was not notified about the restraints or what was the reason for his father being so confused. Antonio Manning did have conversation with wife overnight in regards to patient's confusion. Manning and I apologized to Antonio Manning in regards to the miscommunication of the restraints but reiterated that the restraints were necessary to prevent patient from falling and removing vital equipment. Antonio Manning said "he was okay with that if it is necessary and if patient can be able to talk with family via patient's cell phone." Patient has cell phone at beside either in patient's bed or on bedside table. Also, Antonio Manning and patient's family were concerned with patient possibly having a neurological change or even a stroke. I followed up with Antonio Manning beside RN for today, patient's chart, and with Antonio Manning. Per Antonio Manning and nursing staff assessments, there has been no focal deficits or signs of a stroke occurring or manifesting. This was communicated to Antonio Manning, other patient's son, and Antonio Manning. Antonio Manning was called by Antonio Manning and I updated Antonio Manning after the following up. Antonio Manning established and family decided that Antonio Manning would be the point of contact.  Wife and daughter were able to facetime with patient this afternoon too.  Antonio Manning ordered for no restraints to be used unless absolutely necessary tonight or in future. Family should be made aware before hand.  Refrain from using benzos and opioids to treat agitation or delirium as well. Safety sitter should be ordered if patient gets out of hand. This has been communicated to night shift staff.

## 2018-11-04 NOTE — Progress Notes (Signed)
Addendum  Paged by RN indicating that patient's son Antonio Manning had come to the hospital, he and rest of the family were very upset with several issues regarding to patient's care and wanted me to speak with patient's youngest son listed in his contacts.  I called and spoke with patient's son Antonio Manning in great detail. I carefully listened to all their concerns.  Most importantly they were concerned that patient was confused needing to be restrained and today mental status not close to baseline.  I discussed in detail regarding possible causes for his confusion and measures taken to address same appropriately.  He was appreciative of the phone call.  He agreed to be the primary contact person for the family and wished to be called if there was any acute change in patient's status or if patient got more confused or agitated again tonight then he would be happy to call his father and FaceTime him to try to calm him down.  I updated patient's RN and ICU charge nurse with details above.  Vernell Leep, MD, FACP, [facility]. [facility]  To contact the attending provider between 7A-7P or the covering provider during after hours 7P-7A, please log into the web site [email or web address] and access using universal  password for that web site. If you do not have the password, please call the hospital operator.

## 2018-11-04 NOTE — Progress Notes (Addendum)
[facility] Gastroenterology Progress Note  Antonio Manning 78 y.o. [birth year]  CC: Symptomatic anemia, coffee-ground emesis   Subjective: Yesterday's events noted.  He started having confusion sometime yesterday evening.  Also had agitation requiring use of restraint.  Is feeling somewhat better today.  Calm and cooperative.  Able to answer basic questions.   Objective: Vital signs in last 24 hours: Vitals:   11/04/18 0800 11/04/18 0926  BP: 139/88 (!) 156/95  Pulse: 78 85  Resp: 19   Temp:    SpO2: 99%     Physical Exam:  General:   Elderly patient, alert but somewhat confused  Head:  Normocephalic, without obvious abnormality, atraumatic  Eyes:  , EOM's intact,   Lungs:    Bibasilar crackles.  No respiratory distress  Heart:  Regular rate and rhythm, S1, S2 normal  Abdomen:    Generalized discomfort, epigastric tenderness to palpation, mild distended, bowel sounds present, no peritoneal signs,           Lab Results: Recent Labs    11/02/18 1527 11/03/18 0816 11/04/18 0709  NA 141 138 138  K 4.1 3.6 2.7*  CL 102 103 101  CO2 21* 24 25  GLUCOSE 120* 90 79  BUN 23 20 19   CREATININE 0.88 0.54* 0.52*  CALCIUM 8.6* 8.0* 7.9*  MG 1.5*  --  2.1   Recent Labs    11/02/18 1527 11/04/18 0709  AST 239* 679*  ALT 82* 139*  ALKPHOS 131* 717*  BILITOT 1.2 1.6*  PROT 6.3* 5.7*  ALBUMIN 4.1 3.5   Recent Labs    11/02/18 1614  11/03/18 1618 11/04/18 0709  WBC 10.1   < > 7.9 5.3  NEUTROABS 8.4*  --   --   --   HGB 7.4*   < > 8.1* 7.3*  HCT 23.7*   < > 24.6* 22.6*  MCV 100.0   < > 95.7 95.8  PLT 249   < > 190 155   < > = values in this interval not displayed.   Recent Labs    11/02/18 0830  LABPROT 20.1*  INR 1.7*      Assessment/Plan: -Coffee-ground emesis with 4 g drop in hemoglobin compared to last week.  CT scan of abdomen on October 16, 2018 showed large tumor burden with significant mass-effect of left lobe of liver upon the extrahepatic portal vein without  any definite evidence of thrombosis. -Acute blood loss anemia.  Status post blood transfusion.  Hemoglobin improved  -Confusion. ??  From aspiration pneumonia. -Hepatocellular carcinoma with malignant ascites. -Intermittent diarrhea. -A. fib.  Eliquis on hold.  Recommendations --------------------------- - EGD 07/03 showed severe erosive esophagitis.  No varices.  Mild gastritis and large amount of retained fluid in the stomach.   -No further bleeding episodes.  LFTs trending upwards.  -I had long discussion with patient's wife earlier this morning (around 1 AM).  I have also discussed with patient's son about his condition today.  Yesterday's confusion could be from anesthesia medications versus sundowning versus from aspiration pneumonia.  He is feeling somewhat better this morning.  -Advance diet. D/W Dr. Algis Liming as well.   -Okay to resume anticoagulation from GI standpoint.  Approximately 35-minute spent in patient encounter with more than 50% time spent in counseling and care coordination.     Otis Brace MD, [place] 11/04/2018, 9:28 AM  Contact #  [phone]

## 2018-11-04 NOTE — Progress Notes (Addendum)
Progress Note  Patient Name: Antonio Manning Date of Encounter: 11/04/2018  Primary Cardiologist: Sanda Klein, MD   Subjective   The patient appears somnolent. Denies any pain.  Inpatient Medications    Scheduled Meds: . Chlorhexidine Gluconate Cloth  6 each Topical Daily  . feeding supplement (ENSURE ENLIVE)  237 mL Oral BID BM  . levothyroxine  50 mcg Oral QAC breakfast  . mouth rinse  15 mL Mouth Rinse BID  . metoprolol tartrate  25 mg Oral Q6H  . mirtazapine  7.5 mg Oral QHS  . multivitamin with minerals  1 tablet Oral Daily  . pantoprazole (PROTONIX) IV  40 mg Intravenous Q12H  . potassium chloride  40 mEq Oral Q4H  . sucralfate  1 g Oral TID WC & HS  . vitamin C  250 mg Oral Daily   Continuous Infusions: . ampicillin-sulbactam (UNASYN) IV Stopped (11/04/18 0436)  . diltiazem (CARDIZEM) infusion Stopped (11/03/18 1335)   PRN Meds: acetaminophen **OR** acetaminophen, albuterol, ondansetron **OR** ondansetron (ZOFRAN) IV, traMADol   Vital Signs    Vitals:   11/04/18 0500 11/04/18 0600 11/04/18 0700 11/04/18 0800  BP: 120/79 (!) 146/95 128/71 139/88  Pulse:  95 68 78  Resp: 12 18 12 19   Temp:      TempSrc:      SpO2:  100% 100% 99%  Weight: 73.3 kg     Height:        Intake/Output Summary (Last 24 hours) at 11/04/2018 0857 Last data filed at 11/04/2018 0700 Gross per 24 hour  Intake 2038.05 ml  Output 1130 ml  Net 908.05 ml   Last 3 Weights 11/04/2018 11/03/2018 11/02/2018  Weight (lbs) 161 lb 9.6 oz 154 lb 5.2 oz 148 lb 9.4 oz  Weight (kg) 73.3 kg 70 kg 67.4 kg     Telemetry     SR with PACs- Personally Reviewed  ECG    Ordered  Physical Exam   GEN: No acute distress.   Neck: No JVD Cardiac: RRR with skip, no murmurs, rubs, or gallops.  Respiratory: Clear to auscultation bilaterally. GI: Soft, nontender, non-distended  MS: No edema; No deformity. Neuro:  Nonfocal  Psych: Normal affect   Labs    High Sensitivity Troponin:   Recent Labs  Lab  11/02/18 1020 11/02/18 1527 11/02/18 1805  TROPONINIHS 10.0 25.0* 36.0*      Cardiac EnzymesNo results for input(s): TROPONINI in the last 168 hours. No results for input(s): TROPIPOC in the last 168 hours.   Chemistry Recent Labs  Lab 11/02/18 0827 11/02/18 1527 11/03/18 0816 11/04/18 0709  NA 140 141 138 138  K 3.6 4.1 3.6 2.7*  CL 96* 102 103 101  CO2 11* 21* 24 25  GLUCOSE 143* 120* 90 79  BUN 23 23 20 19   CREATININE 1.14 0.88 0.54* 0.52*  CALCIUM 9.3 8.6* 8.0* 7.9*  PROT 6.8 6.3*  --  5.7*  ALBUMIN 4.2 4.1  --  3.5  AST 69* 239*  --  679*  ALT 21 82*  --  139*  ALKPHOS 120 131*  --  717*  BILITOT 1.2 1.2  --  1.6*  GFRNONAA >60 >60 >60 >60  GFRAA >60 >60 >60 >60  ANIONGAP 33* 18* 11 12     Hematology Recent Labs  Lab 11/03/18 0816 11/03/18 1618 11/04/18 0709  WBC 7.9 7.9 5.3  RBC 2.63* 2.57* 2.36*  HGB 8.3* 8.1* 7.3*  HCT 24.9* 24.6* 22.6*  MCV 94.7 95.7 95.8  MCH 31.6 31.5 30.9  MCHC 33.3 32.9 32.3  RDW 16.5* 16.5* 17.0*  PLT 193 190 155    BNPNo results for input(s): BNP, PROBNP in the last 168 hours.   DDimer No results for input(s): DDIMER in the last 168 hours.   Radiology    X-ray Chest Pa And Lateral  Result Date: 11/03/2018 CLINICAL DATA:  Sepsis EXAM: CHEST - 2 VIEW COMPARISON:  11/02/2018 FINDINGS: Similar cardiomegaly with low lung volumes and basilar densities, suspect atelectasis. On the lateral view, there are small posterior layering effusions. No definite edema pattern or CHF. No pneumothorax. Trachea midline. Aorta atherosclerotic. Degenerative changes of the spine. Diffuse osteopenia noted. IMPRESSION: Mild cardiomegaly without CHF Slight increased bibasilar atelectasis and small posterior layering effusions. Electronically Signed   By: Jerilynn Mages.  Shick M.D.   On: 11/03/2018 13:46   Ct Abdomen Pelvis W Contrast  Result Date: 11/02/2018 CLINICAL DATA:  Vomiting.  Abdominal pain. EXAM: CT ABDOMEN AND PELVIS WITH CONTRAST TECHNIQUE:  Multidetector CT imaging of the abdomen and pelvis was performed using the standard protocol following bolus administration of intravenous contrast. CONTRAST:  161mL OMNIPAQUE IOHEXOL 300 MG/ML  SOLN COMPARISON:  CT scan of October 16, 2018. FINDINGS: Lower chest: Small right pleural effusion is noted with adjacent right lower lobe atelectasis or pneumonia. Hepatobiliary: Innumerable malignant or metastatic lesions are seen involving both hepatic lobes, but most predominantly the left hepatic lobe and anterior segment of right hepatic lobe. Index lesion is noted anterior portion of right hepatic lobe that measures 8.7 x 8.8 cm, which is not significantly changed compared to prior exam. No biliary dilatation or cholelithiasis is noted. Pancreas: Unremarkable. No pancreatic ductal dilatation or surrounding inflammatory changes. Spleen: Normal in size without focal abnormality. Adrenals/Urinary Tract: Adrenal glands are unremarkable. Kidneys are normal, without renal calculi, focal lesion, or hydronephrosis. Bladder is unremarkable. Stomach/Bowel: Stomach is within normal limits. Appendix appears normal. No evidence of bowel wall thickening, distention, or inflammatory changes. Vascular/Lymphatic: Aortic atherosclerosis. 16 mm aortocaval lymph node is noted which is not significantly changed compared to prior exam. Reproductive: Prostate is unremarkable. Other: Moderate ascites is noted.  No hernia is noted. Musculoskeletal: No acute or significant osseous findings. IMPRESSION: Small right pleural effusion is noted with adjacent right lower lobe atelectasis or pneumonia. Stable diffuse hepatic lesions are noted consistent with metastatic disease or malignancy. Stable 16 mm aortocaval lymph node. Moderate ascites. Aortic Atherosclerosis (ICD10-I70.0). Electronically Signed   By: Marijo Conception M.D.   On: 11/02/2018 19:32   Dg Chest Portable 1 View  Result Date: 11/02/2018 CLINICAL DATA:  Diarrhea, weakness, history of  liver cancer EXAM: PORTABLE CHEST 1 VIEW COMPARISON:  08/11/2018 FINDINGS: Trace right pleural effusion. Mild right basilar atelectasis. No pneumothorax. Stable cardiomediastinal silhouette. No aggressive osseous lesion. IMPRESSION: Trace right pleural effusion with atelectasis. Electronically Signed   By: Kathreen Devoid   On: 11/02/2018 10:17   Dg Hip Unilat With Pelvis 1v Left  Result Date: 11/02/2018 CLINICAL DATA:  Left hip replacement EXAM: DG HIP (WITH OR WITHOUT PELVIS) 1V*L* COMPARISON:  No comparison. FINDINGS: Bones are diffusely demineralized. Patient is status post left total hip arthroplasty. Degenerative changes noted in the right hip. IMPRESSION: No acute findings. Electronically Signed   By: Misty Stanley M.D.   On: 11/02/2018 18:51    Cardiac Studies   Echo 11/02/18 - pending   Echo 12/27/17: Study Conclusions  - Left ventricle: The cavity size was normal. Systolic function was normal. The estimated ejection fraction  was in the range of 55% to 60%. Wall motion was normal; there were no regional wall motion abnormalities. Doppler parameters are consistent with abnormal left ventricular relaxation (grade 1 diastolic dysfunction). Doppler parameters are consistent with both elevated ventricular end-diastolic filling pressure and elevated left atrial filling pressure. - Aortic valve: There was trivial regurgitation. - Mitral valve: Mild, late systolicprolapse, involving the posterior leaflet. There was mild regurgitation.   ETT 12/27/17:  Blood pressure demonstrated a normal response to exercise.  There was no ST segment deviation noted during stress.  ETT with normal exercise tolerance (6:10); no chest pain; normal BP response; no ST changes; negative adequate ETT; Duke treadmill score 6.   Patient Profile     Antonio Manning is a 78 y.o. male with a hx of cryptogenic stroke and subsequent diagnosis of atrial fibrillation with low burden by ILR (Dr.  Sallyanne Kuster), ETT negative for ischemia (2019), and chronic diastolic heart failure  who is being seen today for the evaluation of Afib RVR in the setting of GI bleed and sepsis in the settings of acute GIB and pneumonia at the request of Dr. Alfredia Ferguson.  Assessment & Plan    1. Sinus tachycardia with very frequent ectopy, predominantly PACs and short runs of atrial tachycardia, no evidence for atrial fibrillation. Now in SR.  Eliquis on hold for now. Will resume if OK with GI. Cardizem drip stopped yesterday, I will increase Metoprolol to 75 mg po BID.  2.  HTN - elevated, increase metoprolol as above.  3.  Hypothyroidism - TSH 29 (very elevated).  Fre T4 normal.  Will check free T3.   Unusual with that degree of elevation.  4.  GI - Erosive esophagitis on EGD, s/p 2 units of PRBC, Hb today 7.3  For questions or updates, please contact [facility] Please consult [email or web address] for contact info under     Signed, Ena Dawley, MD  11/04/2018, 8:57 AM

## 2018-11-05 DIAGNOSIS — J690 Pneumonitis due to inhalation of food and vomit: Secondary | ICD-10-CM

## 2018-11-05 DIAGNOSIS — Z8673 Personal history of transient ischemic attack (TIA), and cerebral infarction without residual deficits: Secondary | ICD-10-CM

## 2018-11-05 LAB — COMPREHENSIVE METABOLIC PANEL
ALT: 89 U/L — ABNORMAL HIGH (ref 0–44)
AST: 291 U/L — ABNORMAL HIGH (ref 15–41)
Albumin: 3.3 g/dL — ABNORMAL LOW (ref 3.5–5.0)
Alkaline Phosphatase: 572 U/L — ABNORMAL HIGH (ref 38–126)
Anion gap: 9 (ref 5–15)
BUN: 22 mg/dL (ref 8–23)
CO2: 26 mmol/L (ref 22–32)
Calcium: 8.3 mg/dL — ABNORMAL LOW (ref 8.9–10.3)
Chloride: 107 mmol/L (ref 98–111)
Creatinine, Ser: 0.54 mg/dL — ABNORMAL LOW (ref 0.61–1.24)
GFR calc Af Amer: >60 mL/min (ref >60)
GFR calc non Af Amer: >60 mL/min (ref >60)
Glucose, Bld: 87 mg/dL (ref 70–99)
Potassium: 3.9 mmol/L (ref 3.5–5.1)
Sodium: 142 mmol/L (ref 135–145)
Total Bilirubin: 1.7 mg/dL — ABNORMAL HIGH (ref 0.3–1.2)
Total Protein: 5.9 g/dL — ABNORMAL LOW (ref 6.5–8.1)

## 2018-11-05 LAB — CBC
HCT: 23.1 % — ABNORMAL LOW (ref 39.0–52.0)
Hemoglobin: 7.4 g/dL — ABNORMAL LOW (ref 13.0–17.0)
MCH: 31.2 pg (ref 26.0–34.0)
MCHC: 32 g/dL (ref 30.0–36.0)
MCV: 97.5 fL (ref 80.0–100.0)
Platelets: 165 10*3/uL (ref 150–400)
RBC: 2.37 MIL/uL — ABNORMAL LOW (ref 4.22–5.81)
RDW: 17.3 % — ABNORMAL HIGH (ref 11.5–15.5)
WBC: 5.9 10*3/uL (ref 4.0–10.5)
nRBC: 0.8 % — ABNORMAL HIGH (ref 0.0–0.2)

## 2018-11-05 MED ORDER — APIXABAN 5 MG PO TABS
5.0000 mg | ORAL_TABLET | Freq: Two times a day (BID) | ORAL | Status: DC
  Administered 2018-11-05 – 2018-11-09 (×10): 5 mg via ORAL
  Filled 2018-11-05 (×10): qty 1

## 2018-11-05 MED ORDER — ENSURE ENLIVE PO LIQD
237.0000 mL | Freq: Three times a day (TID) | ORAL | Status: DC
  Administered 2018-11-06: 237 mL via ORAL

## 2018-11-05 MED ORDER — AMLODIPINE BESYLATE 5 MG PO TABS
2.5000 mg | ORAL_TABLET | Freq: Every day | ORAL | Status: DC
  Administered 2018-11-05 – 2018-11-06 (×2): 2.5 mg via ORAL
  Filled 2018-11-05 (×2): qty 1

## 2018-11-05 MED ORDER — AMOXICILLIN-POT CLAVULANATE 875-125 MG PO TABS
1.0000 | ORAL_TABLET | Freq: Two times a day (BID) | ORAL | Status: DC
  Administered 2018-11-05 – 2018-11-07 (×4): 1 via ORAL
  Filled 2018-11-05 (×4): qty 1

## 2018-11-05 MED ORDER — PANTOPRAZOLE SODIUM 40 MG PO TBEC
40.0000 mg | DELAYED_RELEASE_TABLET | Freq: Two times a day (BID) | ORAL | Status: DC
  Administered 2018-11-05 – 2018-11-09 (×10): 40 mg via ORAL
  Filled 2018-11-05 (×10): qty 1

## 2018-11-05 NOTE — Progress Notes (Signed)
Initial Nutrition Assessment  DOCUMENTATION CODES:   (unable to assess for malnutrition at this time)  INTERVENTION:  - will increase Ensure Enlive from BID to TID, each supplement provides 350 kcal and 20 grams of protein.  - will order Magic Cup BID with lunch and dinner meals, each supplement provides 290 kcal and 9 grams of protein.  - can Music therapist Cup with Ensure to create a milkshake at lunch and dinner, if desired.  - continue to encourage PO intakes.  - if intakes do not improve, recommend considering a trial of appetite stimulant to assess if this aids patient.     NUTRITION DIAGNOSIS:   Inadequate oral intake related to acute illness, lethargy/confusion, decreased appetite as evidenced by meal completion < 50%.  GOAL:   Patient will meet greater than or equal to 90% of their needs  MONITOR:   PO intake, Supplement acceptance, Labs, Weight trends  REASON FOR ASSESSMENT:   Malnutrition Screening Tool  ASSESSMENT:   78 year old male with medical history of stage 3 hepatocellular carcinoma currently on immunotherapy and Avastin, HTN, HLD, hard of hearing, cryptogenic stroke, and afib on Eliquis. He presented to the ED on 7/2 due to weakness, dyspnea and coffee-ground emesis. In the ED, patient met sepsis criteria including hypothermia, elevated lactate, tachypnea and tachycardia, initiated on broad-spectrum antibiotics. He also had acute blood loss anemia/hemoglobin 6.6 due to acute upper GI bleed. Admitted to SDU for sepsis, acute respiratory failure with hypoxia, elevated lactate, acute upper GI bleed with ABLA and A. fib with RVR.  [facility], cardiology and medical oncology consulted. Status post EGD 7/3.  Hospital course complicated by acute confusion overnight 7/3, possibly related to medications (Remeron and tramadol), much improved and stable now.  Weight on admission was 148 lb and weight this AM was recorded as 167 lb (+8.5 kg/19 lb) in 3 days; suspect this is  d/t fluid and/or differences in scales. Weight from 4/22-6/24 was stable at ~148 lb so used admission weight in estimating needs. Flow sheet indicates that patient was a/o to self and place only as of this AM. Also noted patient is HOH. Did not attempt to call patient at this time but will see in person as able.   Patient was NPO at the time of admission and then advanced to [place] on 7/3 at 9:55 AM and to Regular on 7/4 at 1:30 PM. Per RN flow sheet, patient consumed 15% of lunch and 10% of dinner on 7/3; 25% of breakfast and 15% of lunch yesterday. Ensure Enlive was ordered BID yesterday and patient has refused all 3 bottles that were offered to him.   Patient is being followed outpatient by a RD at the [facility]. Last note was from 6/23 when RD spoke with patient's wife. Patient had been intermittently consuming Ensure Plus with ice cream (milkshake) and as of 6/23 patient was eating much better than previously and his appetite was the best it had been since prior to cancer diagnosis.   Suspect current decreased intakes are multifactoral: mentation is not at baseline, GIB, severe erosive esophagitis and mild gastritis, and other processes related to acute illness.    Medications reviewed; 50 mcg oral synthroid/day, daily multivitamin with minerals, 40 mg oral protonix BID, 40 mEq K-Dur x3 doses 7/4, 1 g carafate TID, 250 mg oral ascorbic acid/day.   Labs reviewed; creatinine: 0.54 mg/dl, Ca: 8.3 mg/dl, Alk Phos and LFTs elevated.      NUTRITION - FOCUSED PHYSICAL EXAM:  unable to  complete at this time.   Diet Order:   Diet Order            Diet regular Room service appropriate? Yes; Fluid consistency: Thin  Diet effective now              EDUCATION NEEDS:   Not appropriate for education at this time  Skin:  Skin Assessment: Reviewed RN Assessment  Last BM:  7/5  Height:   Ht Readings from Last 1 Encounters:  11/02/18 6' (1.829 m)    Weight:   Wt Readings from Last 1  Encounters:  11/05/18 75.6 kg    Ideal Body Weight:  83.6 kg  BMI:  Body mass index is 22.6 kg/m.  Estimated Nutritional Needs:   Kcal:  2015-2215 kcal  Protein:  100-115 grams  Fluid:  >/= 2 L/day      Jarome Matin, MS, RD, LDN, [facility] Inpatient Clinical Dietitian Pager # [phone] After hours/weekend pager # [phone]

## 2018-11-05 NOTE — Progress Notes (Addendum)
Physical Therapy Treatment Patient Details Name: Antonio Manning MRN: [id number] DOB: [birth year] Today's Date: 11/05/2018    History of Present Illness 78 year old man admitted wtih weakness and coffee ground emesis.  H/O HCC on chemo, CVA with expressive aphasia, PAF, and hearing loss. Pt with L THA 2013, pt reports fall and "dislocating hip" but imaging obtained 7/2 WNL.    PT Comments    Pt cooperative but requiring encouragement to move beyond standing.  Pt ambulated short distance in room but fatigues easily - HgB 7.4 - no c/o dizziness.   Follow Up Recommendations  Home health PT;Supervision/Assistance - 24 hour     Equipment Recommendations  None recommended by PT    Recommendations for Other Services       Precautions / Restrictions Precautions Precautions: Fall Precaution Comments: watch HR Restrictions Weight Bearing Restrictions: No    Mobility  Bed Mobility               General bed mobility comments: Pt up in chair with nursing and requests back to same  Transfers Overall transfer level: Needs assistance Equipment used: Rolling walker (2 wheeled) Transfers: Sit to/from Stand Sit to Stand: Min assist;Mod assist;+2 physical assistance         General transfer comment: cues for transition position and physical assist to bring wt up and fwd from low surface and to balance in initial standing.  Sit<>stand x 2  Ambulation/Gait Ambulation/Gait assistance: Min assist Gait Distance (Feet): 14 Feet Assistive device: Rolling walker (2 wheeled) Gait Pattern/deviations: Step-through pattern;Decreased stride length;Shuffle;Trunk flexed Gait velocity: decr   General Gait Details: min assist for balance and RW management; cues for posture and position from [employer]             Wheelchair Mobility    Modified Rankin (Stroke Patients Only)       Balance Overall balance assessment: Needs assistance;History of Falls Sitting-balance support: No  upper extremity supported Sitting balance-Leahy Scale: Good     Standing balance support: Bilateral upper extremity supported Standing balance-Leahy Scale: Poor Standing balance comment: reliant on UE support                            Cognition Arousal/Alertness: Awake/alert Behavior During Therapy: WFL for tasks assessed/performed Overall Cognitive Status: Within Functional Limits for tasks assessed                                        Exercises      General Comments        Pertinent Vitals/Pain Pain Assessment: Faces Faces Pain Scale: Hurts a little bit Pain Location: L hip with mobility Pain Descriptors / Indicators: Sore Pain Intervention(s): Limited activity within patient's tolerance;Monitored during session    Home Living                      Prior Function            PT Goals (current goals can now be found in the care plan section) Acute Rehab PT Goals Patient Stated Goal: home PT Goal Formulation: With patient Time For Goal Achievement: 11/17/18 Potential to Achieve Goals: Good Progress towards PT goals: Progressing toward goals    Frequency    Min 3X/week      PT Plan Current plan remains appropriate  Co-evaluation              AM-PAC PT "6 Clicks" Mobility   Outcome Measure  Help needed turning from your back to your side while in a flat bed without using bedrails?: A Little Help needed moving from lying on your back to sitting on the side of a flat bed without using bedrails?: A Little Help needed moving to and from a bed to a chair (including a wheelchair)?: A Little Help needed standing up from a chair using your arms (e.g., wheelchair or bedside chair)?: A Little Help needed to walk in hospital room?: A Little Help needed climbing 3-5 steps with a railing? : A Lot 6 Click Score: 17    End of Session Equipment Utilized During Treatment: Gait belt Activity Tolerance: Patient limited by  fatigue Patient left: in chair;with call bell/phone within reach;with chair alarm set Nurse Communication: Mobility status PT Visit Diagnosis: Other abnormalities of gait and mobility (R26.89);Difficulty in walking, not elsewhere classified (R26.2)     Time: 2440-1027 PT Time Calculation (min) (ACUTE ONLY): 19 min  Charges:    Gait training 8-23 min                    Mauro Kaufmann PT Acute Rehabilitation Services Pager [phone] Office [phone]    Ashlon Lottman 11/05/2018, 12:31 PM

## 2018-11-05 NOTE — Progress Notes (Signed)
Pharmacy: Augmentin  Augmentin 875 mg po BID to end 7/8 PM for total of 7 days abx therapy  Eudelia Bunch, Pharm.D 11/05/2018 11:43 AM

## 2018-11-05 NOTE — Progress Notes (Signed)
Assisted patient from chair to bed with assist x 2,  during transfer patient states he does not feel good. Patient unable to verbalize what he meant by not feel good. Patient states, " I just don't feel good." Patient denies SOB and chest pain. Oxygen level was 90% on room air all other VS stable. Patient placed on oxygen via nasal cannula @ 2 liters. Patient positioned comfortably in bed. Oral hydration offered.  Patient states he feels better. Oxygen saturation  94% on 2 liters of oxygen.

## 2018-11-05 NOTE — Progress Notes (Signed)
The patient is receiving Protonix by the intravenous route.  Based on criteria approved by the Pharmacy and [place], the medication is being converted to the equivalent oral dose form.  These criteria include: -No active GI bleeding - per GI note 7/4 -Able to tolerate diet of full liquids (or better) or tube feeding -Able to tolerate other medications by the oral or enteral route  If you have any questions about this conversion, please contact the Pharmacy Department (phone [phone]  Thank you. Eudelia Bunch, Pharm.D 11/05/2018 8:47 AM

## 2018-11-05 NOTE — Progress Notes (Signed)
PROGRESS NOTE   Antonio Manning  NFA:[id number]    DOB: 1940/10/15    DOA: 11/02/2018  PCP: Hulan Fess, MD   I have briefly reviewed patients previous medical records in [facility].  Chief Complaint  Patient presents with  . Weakness    Brief Narrative:  78 year old male with PMH of stage III hepatocellular carcinoma currently on immunotherapy and Avastin, HTN, HLD, hard of hearing, cryptogenic stroke, paroxysmal atrial fibrillation on Eliquis, presented to the ED on 11/02/2018 due to weakness, dyspnea and coffee-ground emesis.  In the ED, patient met sepsis criteria including hypothermia, elevated lactate, tachypnea and tachycardia, initiated on broad-spectrum antibiotics.  He also had acute blood loss anemia/hemoglobin 6.6 due to acute upper GI bleed.  Admitted to stepdown unit for sepsis, acute respiratory failure with hypoxia, elevated lactate, acute upper GI bleed with ABLA and A. fib with RVR.  [facility], cardiology and medical oncology consulted.  Status post EGD 7/3.  Hospital course complicated by acute confusion overnight 7/3, possibly related to medications (Remeron and tramadol), much improved and stable now.   Assessment & Plan:   Principal Problem:   Sepsis ([place]) Active Problems:   S/P left hip replacement   Fatigue   Hyperlipidemia   History of CVA (cerebrovascular accident)   PAF (paroxysmal atrial fibrillation) (HCC)   Liver lesion   Hepatocellular carcinoma (HCC)   Lactic acidosis   High anion gap metabolic acidosis   Symptomatic anemia   Coffee ground emesis   Hypothyroidism   Atrial fibrillation with RVR (HCC)   Abdominal pain   Left hip pain   Sepsis  Present on admission (hypothermia, tachycardia, leukocytosis and elevated lactate)  Could be related to aspiration during emesis episodes.  CT abdomen shows right lower lobe atelectasis versus pneumonia.  Blood cultures x2: Negative to date.  SARS coronavirus 2: Negative.  Urine culture negative.   MRSA PCR negative.  Procalcitonin 1.01 > 1.36.  Lactate has normalized.  He was treated per sepsis criteria with IV fluids and started on broad-spectrum IV antibiotics including IV vancomycin and cefepime.  Given concern for possible aspiration pneumonia, negative MRSA PCR, discontinued IV vancomycin and cefepime and changed to IV Unasyn.  At discharge may consider oral Augmentin.  Sepsis physiology resolved.  No high fevers.  Blood cultures continue to remain negative to date.  Change IV Unasyn to Augmentin and complete total 7 days course.  Acute respiratory failure with hypoxia  Secondary to pneumonia/atelectasis.  As per RN, was still on oxygen via nasal cannula at 6 L/min overnight.  Mobilize, incentive spirometry, attempt to wean oxygen for saturations >92%.  Acute upper GI bleed/severe erosive esophagitis and mild gastritis  He was treated supportively with bowel rest/n.p.o., IV Protonix drip, IV octreotide drip.  Interestingly FOBT was negative.  [facility] was consulted and underwent EGD 7/3 with findings as noted below.  Post EGD placed on full liquid diet, octreotide discontinued, Carafate for 2 weeks, continue Protonix for now.  Improved.  Discussed with Dr. Alessandra Bevels, advance to regular consistency diet, twice daily PPI indefinitely and sucralfate for 2 weeks.  Outpatient follow-up with GI and biopsies.  Acute blood loss anemia/symptomatic  Secondary to acute upper GI bleed.  S/p 2 units PRBC and hemoglobin has improved from 6.6 on admission to 8.1 range.  Transfuse as needed for hemoglobin 7 or less.  Eliquis which was held due to GI bleed resumed on 7/5.  Hemoglobin stable in the 7.3-7.4 range over the last 24 hours.  Continue to follow CBC daily.  Lactic acidosis  Secondary to sepsis, acute upper GI bleed, acute blood loss anemia  Treated as above and resolved.  High anion gap metabolic acidosis  Anion gap 33 on admission.  Resolved.  Hypothyroidism  TSH  29.022, free T4: 0.97.  Free T3: 1.1.  Currently on Synthroid 50 mcg daily.  ?  Abnormal TSH related to acute illness/cancer.  Clinically appears euthyroid.  Continue current dose of Synthroid, defer to PCP regarding adjusting dose and consider repeating TSH in 4 weeks.  Sinus tachycardia with frequent PACs/history of A. fib.  Cardiology follow-up appreciated.  Patient was placed on IV Cardizem drip and reverted to sinus rhythm overnight of admission.  Cardizem drip discontinued 7/3  Cardiology have changed metoprolol to 75 mg twice daily.  Eliquis was held due to active GI bleed and anemia.  Now that hemoglobin is stable and no overt GI bleed, as discussed with GI and cardiology, resumed Eliquis on 7/5 with close CBC monitoring.  Cardiomyopathy  TTE 7/3: LVEF 45-50%.  No chest pain reported.  Management per cardiology.  Elevated troponin  Likely due to demand ischemia from acute illness discussed above rather than due to ACS.  Essential hypertension  Metoprolol 75 mg twice daily.  Resuming home amlodipine 2.5 mg daily.  Stage III hepatocellular carcinoma  Oncology input appreciated.  Currently on atezolizumab (immunotherapy) and Avastin, last dose 6/24.  LFTs have worsened 2 days ago but are better today.?  Reactive.  History of CVA  Eliquis resumed.  Hyperlipidemia  Continue atorvastatin.  Abdominal pain  Present on admission.  Likely related to esophagitis now seen on EGD.  Stable hepatic metastatic lesions and moderate ascites.  Abdominal pain may be related to his [place].  Left hip pain  X-ray without fractures or acute findings.  Hypomagnesemia  Replaced.  Hypokalemia  Replaced.  Acute toxic metabolic encephalopathy  Overnight 7/3 patient had confusion, agitation and had to be restrained.  Possibly due to medications i.e. Remeron which she was not taking at home and tramadol, discontinued.  Clinically much improved and probably close to baseline.   Verified with patient's youngest son this morning.  Avoid physical restraints.  If he gets agitated again, try to get a [employer] cabin crew.  Also to call patient's youngest son who can face time the patient and try to reorient him and calm him down.   DVT prophylaxis: SCDs Code Status: Full Family Communication: Discussed in detail with patient's youngest son on 7/5 morning, he had just finished speaking with patient 30 minutes prior.  He indicates that patient's mental status has significantly improved and may be approaching baseline.  Updated care and answered questions. Disposition: Consider transferring to telemetry later today, monitor overnight and possible discharge home with home health services on 7/6.   Consultants:  [facility] Cardiology Medical Oncology  Procedures:  EGD 3/7  Antimicrobials:  IV cefepime and vancomycin 7/2-7/3 IV Unasyn 7/3 > 7/5 Augmentin 7/6 >   Subjective: Patient denies complaints.  Expressed displeasure and frustration that he is unable to go home today.  He feels like he is ready to go home.  He denies nausea, vomiting, pain, dyspnea.  Coherent and knows why he was hospitalized.  As per RN, uneventful last night, no acute issues reported.  ROS: As above, otherwise negative.  Objective:  Vitals:   11/05/18 0500 11/05/18 0600 11/05/18 0840 11/05/18 1023  BP: 133/89 (!) 144/96  (!) 141/99  Pulse: 61 73  79  Resp:  10 13    Temp: (!) 97.5 F (36.4 C)  97.9 F (36.6 C)   TempSrc: Oral  Oral   SpO2: 98% 98%    Weight: 75.6 kg     Height:        Examination:  General exam: Pleasant elderly male, moderately built and nourished lying comfortably supine in bed.  He looks much improved compared to yesterday morning. Respiratory system: Slightly diminished breath sounds in the bases without wheezing, rhonchi or crackles.  Rest of lung fields clear to auscultation.  No increased work of breathing. Cardiovascular system: S1 & S2 heard, RRR. No JVD,  murmurs, rubs, gallops or clicks.  Trace bilateral ankle edema.  Telemetry personally reviewed: Sinus rhythm with few PACs. Gastrointestinal system: Abdomen is nondistended, soft and nontender today.  Palpable nonpulsatile mass in the epigastric region which could be his HCC.  Normal bowel sounds heard. Central nervous system: Alert and oriented x4. No focal neurological deficits. Extremities: Symmetric 5 x 5 power.  Hard of hearing. Skin: No rashes, lesions or ulcers Psychiatry: Judgement and insight appears appear intact. Mood & affect appropriate.     Data Reviewed: I have personally reviewed following labs and imaging studies  CBC: Recent Labs  Lab 11/02/18 1614  11/03/18 0816 11/03/18 1618 11/04/18 0709 11/04/18 1652 11/05/18 0544  WBC 10.1   < > 7.9 7.9 5.3 6.0 5.9  NEUTROABS 8.4*  --   --   --   --   --   --   HGB 7.4*   < > 8.3* 8.1* 7.3* 7.3* 7.4*  HCT 23.7*   < > 24.9* 24.6* 22.6* 22.7* 23.1*  MCV 100.0   < > 94.7 95.7 95.8 96.6 97.5  PLT 249   < > 193 190 155 160 165   < > = values in this interval not displayed.   Basic Metabolic Panel: Recent Labs  Lab 11/02/18 0827 11/02/18 1527 11/03/18 0816 11/04/18 0709 11/05/18 0544  NA 140 141 138 138 142  K 3.6 4.1 3.6 2.7* 3.9  CL 96* 102 103 101 107  CO2 11* 21* 24 25 26   GLUCOSE 143* 120* 90 79 87  BUN 23 23 20 19 22   CREATININE 1.14 0.88 0.54* 0.52* 0.54*  CALCIUM 9.3 8.6* 8.0* 7.9* 8.3*  MG 2.0 1.5*  --  2.1  --    Liver Function Tests: Recent Labs  Lab 11/02/18 0827 11/02/18 1527 11/04/18 0709 11/05/18 0544  AST 69* 239* 679* 291*  ALT 21 82* 139* 89*  ALKPHOS 120 131* 717* 572*  BILITOT 1.2 1.2 1.6* 1.7*  PROT 6.8 6.3* 5.7* 5.9*  ALBUMIN 4.2 4.1 3.5 3.3*   Coagulation Profile: Recent Labs  Lab 11/02/18 0830  INR 1.7*   Cardiac Enzymes: Recent Labs  Lab 11/02/18 0827  CKTOTAL 24*   HbA1C: Recent Labs    11/03/18 0210  HGBA1C 5.0   CBG: Recent Labs  Lab 11/02/18 0828 11/03/18  0739  GLUCAP 119* 86    Recent Results (from the past 240 hour(s))  SARS Coronavirus 2 (CEPHEID- Performed in [place] hospital lab), Hosp Order     Status: None   Collection Time: 11/02/18  8:27 AM   Specimen: Nasopharyngeal Swab  Result Value Ref Range Status   SARS Coronavirus 2 NEGATIVE NEGATIVE Final    Comment: (NOTE) If result is NEGATIVE SARS-CoV-2 target nucleic acids are NOT DETECTED. The SARS-CoV-2 RNA is generally detectable in upper and lower  respiratory specimens during the acute  phase of infection. The lowest  concentration of SARS-CoV-2 viral copies this assay can detect is 250  copies / mL. A negative result does not preclude SARS-CoV-2 infection  and should not be used as the sole basis for treatment or other  patient management decisions.  A negative result may occur with  improper specimen collection / handling, submission of specimen other  than nasopharyngeal swab, presence of viral mutation(s) within the  areas targeted by this assay, and inadequate number of viral copies  (<250 copies / mL). A negative result must be combined with clinical  observations, patient history, and epidemiological information. If result is POSITIVE SARS-CoV-2 target nucleic acids are DETECTED. The SARS-CoV-2 RNA is generally detectable in upper and lower  respiratory specimens dur ing the acute phase of infection.  Positive  results are indicative of active infection with SARS-CoV-2.  Clinical  correlation with patient history and other diagnostic information is  necessary to determine patient infection status.  Positive results do  not rule out bacterial infection or co-infection with other viruses. If result is PRESUMPTIVE POSTIVE SARS-CoV-2 nucleic acids MAY BE PRESENT.   A presumptive positive result was obtained on the submitted specimen  and confirmed on repeat testing.  While 2019 novel coronavirus  (SARS-CoV-2) nucleic acids may be present in the submitted sample   additional confirmatory testing may be necessary for epidemiological  and / or clinical management purposes  to differentiate between  SARS-CoV-2 and other Sarbecovirus currently known to infect humans.  If clinically indicated additional testing with an alternate test  methodology [phone]) is advised. The SARS-CoV-2 RNA is generally  detectable in upper and lower respiratory sp ecimens during the acute  phase of infection. The expected result is Negative. Fact Sheet for Patients:  StrictlyIdeas.no Fact Sheet for Healthcare Providers: BankingDealers.co.za This test is not yet approved or cleared by the [place] FDA and has been authorized for detection and/or diagnosis of SARS-CoV-2 by FDA under an Emergency Use Authorization (EUA).  This EUA will remain in effect (meaning this test can be used) for the duration of the COVID-19 declaration under Section 564(b)(1) of the Act, 21 U.S.C. section 360bbb-3(b)(1), unless the authorization is terminated or revoked sooner. Performed at [facility], [place] [address], [place], [place] [address]   Blood culture (routine x 2)     Status: None (Preliminary result)   Collection Time: 11/02/18  8:30 AM   Specimen: BLOOD  Result Value Ref Range Status   Specimen Description   Final    BLOOD BLOOD RIGHT FOREARM Performed at [place] [address] [place], [place] [address]    Special Requests   Final    BOTTLES DRAWN AEROBIC AND ANAEROBIC Blood Culture adequate volume Performed at [facility] [address], [place], [place] [address]    Culture   Final    NO GROWTH 2 DAYS Performed at [facility] [address], [place], [place] [address]    Report Status PENDING  Incomplete  Urine culture     Status: None   Collection Time: 11/02/18 12:00 PM   Specimen: Urine, Clean Catch  Result Value Ref Range Status   Specimen  Description   Final    URINE, CLEAN CATCH Performed at [facility], [place] [address] [place], [place] [address]    Special Requests   Final    NONE Performed at [facility], [place] [address] [place], [place] [address]  Culture   Final    NO GROWTH Performed at [facility], [place] [address], [place], [place] [address]    Report Status 11/03/2018 FINAL  Final  MRSA PCR Screening     Status: None   Collection Time: 11/02/18 12:05 PM   Specimen: Nasopharyngeal  Result Value Ref Range Status   MRSA by PCR NEGATIVE NEGATIVE Final    Comment:        The GeneXpert MRSA Assay (FDA approved for NASAL specimens only), is one component of a comprehensive MRSA colonization surveillance program. It is not intended to diagnose MRSA infection nor to guide or monitor treatment for MRSA infections. Performed at [facility], [place] [address] [place], [place] [address]   Blood culture (routine x 2)     Status: None (Preliminary result)   Collection Time: 11/02/18  3:27 PM   Specimen: BLOOD  Result Value Ref Range Status   Specimen Description   Final    BLOOD LEFT ARM Performed at [place] [address] [place], [place] [address]    Special Requests   Final    BOTTLES DRAWN AEROBIC ONLY Blood Culture adequate volume Performed at [facility] [address], [place], [place] [address]    Culture   Final    NO GROWTH 2 DAYS Performed at [place] [address] [place], [place] [address]    Report Status PENDING  Incomplete         Radiology Studies: X-ray Chest Pa And Lateral  Result Date: 11/03/2018 CLINICAL DATA:  Sepsis EXAM: CHEST - 2 VIEW COMPARISON:  11/02/2018 FINDINGS: Similar cardiomegaly with low lung volumes and basilar densities, suspect atelectasis. On the lateral view, there are small posterior layering effusions. No definite edema pattern or CHF. No  pneumothorax. Trachea midline. Aorta atherosclerotic. Degenerative changes of the spine. Diffuse osteopenia noted. IMPRESSION: Mild cardiomegaly without CHF Slight increased bibasilar atelectasis and small posterior layering effusions. Electronically Signed   By: Jerilynn Mages.  Shick M.D.   On: 11/03/2018 13:46        Scheduled Meds: . amLODipine  2.5 mg Oral Daily  . apixaban  5 mg Oral BID  . Chlorhexidine Gluconate Cloth  6 each Topical Daily  . feeding supplement (ENSURE ENLIVE)  237 mL Oral BID BM  . levothyroxine  50 mcg Oral QAC breakfast  . mouth rinse  15 mL Mouth Rinse BID  . metoprolol tartrate  75 mg Oral BID  . multivitamin with minerals  1 tablet Oral Daily  . pantoprazole  40 mg Oral BID  . sucralfate  1 g Oral TID WC & HS  . vitamin C  250 mg Oral Daily   Continuous Infusions: . ampicillin-sulbactam (UNASYN) IV 3 g (11/05/18 1019)     LOS: 3 days     Vernell Leep, MD, FACP, [facility]. [facility]  To contact the attending provider between 7A-7P or the covering provider during after hours 7P-7A, please log into the web site [email or web address] and access using universal [place] password for that web site. If you do not have the password, please call the hospital operator.  11/05/2018, 11:16 AM

## 2018-11-05 NOTE — Progress Notes (Addendum)
[facility] Gastroenterology Progress Note  Antonio Manning 78 y.o. 1940-09-08  CC: Symptomatic anemia, coffee-ground emesis   Subjective: More alert this morning.  Discussed with nursing staff.  No further nausea and vomiting.  No evidence of active bleeding.  Continues to have mild abdominal discomfort.  ROS : Afebrile.  Complaining of generalized weakness  Objective: Vital signs in last 24 hours: Vitals:   11/05/18 0600 11/05/18 0840  BP: (!) 144/96   Pulse: 73   Resp: 13   Temp:  97.9 F (36.6 C)  SpO2: 98%     Physical Exam:  General:   Elderly patient, alert and slow to respond  Head:  Normocephalic, without obvious abnormality, atraumatic  Eyes:  , EOM's intact,   Lungs:    Bibasilar crackles.  No respiratory distress  Heart:  Regular rate and rhythm, S1, S2 normal  Abdomen:    Generalized discomfort, epigastric tenderness to palpation, mild distended, bowel sounds present, no peritoneal signs,   LE 1 + Edema        Lab Results: Recent Labs    11/02/18 1527  11/04/18 0709 11/05/18 0544  NA 141   < > 138 142  K 4.1   < > 2.7* 3.9  CL 102   < > 101 107  CO2 21*   < > 25 26  GLUCOSE 120*   < > 79 87  BUN 23   < > 19 22  CREATININE 0.88   < > 0.52* 0.54*  CALCIUM 8.6*   < > 7.9* 8.3*  MG 1.5*  --  2.1  --    < > = values in this interval not displayed.   Recent Labs    11/04/18 0709 11/05/18 0544  AST 679* 291*  ALT 139* 89*  ALKPHOS 717* 572*  BILITOT 1.6* 1.7*  PROT 5.7* 5.9*  ALBUMIN 3.5 3.3*   Recent Labs    11/02/18 1614  11/04/18 1652 11/05/18 0544  WBC 10.1   < > 6.0 5.9  NEUTROABS 8.4*  --   --   --   HGB 7.4*   < > 7.3* 7.4*  HCT 23.7*   < > 22.7* 23.1*  MCV 100.0   < > 96.6 97.5  PLT 249   < > 160 165   < > = values in this interval not displayed.   No results for input(s): LABPROT, INR in the last 72 hours.    Assessment/Plan: -Severe erosive esophagitis. -Acute blood loss anemia.  Status post blood transfusion.  Hemoglobin  stable -Confusion. ??  From aspiration pneumonia. -Hepatocellular carcinoma with malignant ascites. -Intermittent diarrhea. -A. fib.  Eliquis on hold.  Recommendations --------------------------- - EGD 07/03 showed severe erosive esophagitis.  No varices.  Mild gastritis and large amount of retained fluid in the stomach.   -No further bleeding episodes.  LFTs improving.  Biopsies pending.  -Continue twice daily PPI for now.  Continue Carafate for 2 weeks.  -On regular diet.  -Okay to resume anticoagulation from GI standpoint.  Recommend 1 physician, preferably hospitalist, contacting 1 family member for updates as needed.  GI will follow.     Otis Brace MD, [place] 11/05/2018, 9:12 AM  Contact #  [phone]

## 2018-11-05 NOTE — Progress Notes (Signed)
Progress Note  Patient Name: Antonio Manning Date of Encounter: 11/05/2018  Primary Cardiologist: Sanda Klein, MD   Subjective   He feels better today, he is tired but denies any chest pain or SOB.  Inpatient Medications    Scheduled Meds: . Chlorhexidine Gluconate Cloth  6 each Topical Daily  . feeding supplement (ENSURE ENLIVE)  237 mL Oral BID BM  . levothyroxine  50 mcg Oral QAC breakfast  . mouth rinse  15 mL Mouth Rinse BID  . metoprolol tartrate  75 mg Oral BID  . multivitamin with minerals  1 tablet Oral Daily  . pantoprazole  40 mg Oral BID  . sucralfate  1 g Oral TID WC & HS  . vitamin C  250 mg Oral Daily   Continuous Infusions: . ampicillin-sulbactam (UNASYN) IV 3 g (11/05/18 1019)   PRN Meds: acetaminophen **OR** acetaminophen, albuterol, ondansetron **OR** ondansetron (ZOFRAN) IV   Vital Signs    Vitals:   11/05/18 0400 11/05/18 0500 11/05/18 0600 11/05/18 0840  BP: 125/78 133/89 (!) 144/96   Pulse: (!) 59 61 73   Resp: (!) 9 10 13    Temp:  (!) 97.5 F (36.4 C)  97.9 F (36.6 C)  TempSrc:  Oral  Oral  SpO2: 98% 98% 98%   Weight:  75.6 kg    Height:        Intake/Output Summary (Last 24 hours) at 11/05/2018 1022 Last data filed at 11/05/2018 0600 Gross per 24 hour  Intake 433.83 ml  Output 400 ml  Net 33.83 ml   Last 3 Weights 11/05/2018 11/04/2018 11/03/2018  Weight (lbs) 166 lb 10.7 oz 161 lb 9.6 oz 154 lb 5.2 oz  Weight (kg) 75.6 kg 73.3 kg 70 kg     Telemetry     SR with PACs- Personally Reviewed  ECG    Ordered  Physical Exam   GEN: No acute distress.   Neck: No JVD Cardiac: RRR with skip, no murmurs, rubs, or gallops.  Respiratory: Clear to auscultation bilaterally. GI: Soft, nontender, non-distended  MS: No edema; No deformity. Neuro:  Nonfocal  Psych: Normal affect   Labs    High Sensitivity Troponin:   Recent Labs  Lab 11/02/18 1020 11/02/18 1527 11/02/18 1805  TROPONINIHS 10.0 25.0* 36.0*      Cardiac EnzymesNo  results for input(s): TROPONINI in the last 168 hours. No results for input(s): TROPIPOC in the last 168 hours.   Chemistry Recent Labs  Lab 11/02/18 1527 11/03/18 0816 11/04/18 0709 11/05/18 0544  NA 141 138 138 142  K 4.1 3.6 2.7* 3.9  CL 102 103 101 107  CO2 21* 24 25 26   GLUCOSE 120* 90 79 87  BUN 23 20 19 22   CREATININE 0.88 0.54* 0.52* 0.54*  CALCIUM 8.6* 8.0* 7.9* 8.3*  PROT 6.3*  --  5.7* 5.9*  ALBUMIN 4.1  --  3.5 3.3*  AST 239*  --  679* 291*  ALT 82*  --  139* 89*  ALKPHOS 131*  --  717* 572*  BILITOT 1.2  --  1.6* 1.7*  GFRNONAA >60 >60 >60 >60  GFRAA >60 >60 >60 >60  ANIONGAP 18* 11 12 9      Hematology Recent Labs  Lab 11/04/18 0709 11/04/18 1652 11/05/18 0544  WBC 5.3 6.0 5.9  RBC 2.36* 2.35* 2.37*  HGB 7.3* 7.3* 7.4*  HCT 22.6* 22.7* 23.1*  MCV 95.8 96.6 97.5  MCH 30.9 31.1 31.2  MCHC 32.3 32.2 32.0  RDW  17.0* 16.8* 17.3*  PLT 155 160 165    BNPNo results for input(s): BNP, PROBNP in the last 168 hours.   DDimer No results for input(s): DDIMER in the last 168 hours.   Radiology    X-ray Chest Pa And Lateral  Result Date: 11/03/2018 CLINICAL DATA:  Sepsis EXAM: CHEST - 2 VIEW COMPARISON:  11/02/2018 FINDINGS: Similar cardiomegaly with low lung volumes and basilar densities, suspect atelectasis. On the lateral view, there are small posterior layering effusions. No definite edema pattern or CHF. No pneumothorax. Trachea midline. Aorta atherosclerotic. Degenerative changes of the spine. Diffuse osteopenia noted. IMPRESSION: Mild cardiomegaly without CHF Slight increased bibasilar atelectasis and small posterior layering effusions. Electronically Signed   By: Jerilynn Mages.  Shick M.D.   On: 11/03/2018 13:46    Cardiac Studies   Echo 11/02/18 - pending   Echo 12/27/17: Study Conclusions  - Left ventricle: The cavity size was normal. Systolic function was normal. The estimated ejection fraction was in the range of 55% to 60%. Wall motion was normal;  there were no regional wall motion abnormalities. Doppler parameters are consistent with abnormal left ventricular relaxation (grade 1 diastolic dysfunction). Doppler parameters are consistent with both elevated ventricular end-diastolic filling pressure and elevated left atrial filling pressure. - Aortic valve: There was trivial regurgitation. - Mitral valve: Mild, late systolicprolapse, involving the posterior leaflet. There was mild regurgitation.   ETT 12/27/17:  Blood pressure demonstrated a normal response to exercise.  There was no ST segment deviation noted during stress.  ETT with normal exercise tolerance (6:10); no chest pain; normal BP response; no ST changes; negative adequate ETT; Duke treadmill score 6.   Patient Profile     Antonio Manning is a 78 y.o. male with a hx of cryptogenic stroke and subsequent diagnosis of atrial fibrillation with low burden by ILR (Dr. Sallyanne Kuster), ETT negative for ischemia (2019), and chronic diastolic heart failure  who is being seen today for the evaluation of Afib RVR in the setting of GI bleed and sepsis in the settings of acute GIB and pneumonia at the request of Dr. Alfredia Ferguson.  Assessment & Plan    1. Sinus tachycardia with very frequent ectopy, predominantly PACs and short runs of atrial tachycardia, no evidence for atrial fibrillation. Now in SR.  I would restart Eliquis today, ok with GI, follow Hb closely. Cardizem drip stopped, continue Metoprolol to 75 mg po BID.  2.  HTN - increased metoprolol. I will restart home amlodipine 2.5 mg po daily  3.  Hypothyroidism - TSH 29 (very elevated).  Fre T4 normal.  Will check free T3.   Unusual with that degree of elevation.  4.  GI - Erosive esophagitis on EGD, s/p 2 units of PRBC, Hb today 7.4, we will follow post eliquis initiation. He has received fluids and blood as fluid resuscitation, overall 12 lbs gain but he has no signs of CHF or fluid overload.  For questions or  updates, please contact [place] Please consult [email or web address] for contact info under     Signed, Ena Dawley, MD  11/05/2018, 10:22 AM

## 2018-11-06 ENCOUNTER — Other Ambulatory Visit: Payer: Self-pay

## 2018-11-06 ENCOUNTER — Inpatient Hospital Stay (HOSPITAL_COMMUNITY): Payer: Medicare Other

## 2018-11-06 DIAGNOSIS — I10 Essential (primary) hypertension: Secondary | ICD-10-CM

## 2018-11-06 DIAGNOSIS — I5021 Acute systolic (congestive) heart failure: Secondary | ICD-10-CM

## 2018-11-06 LAB — TYPE AND SCREEN
ABO/RH(D): A POS
Antibody Screen: NEGATIVE
Unit division: 0
Unit division: 0
Unit division: 0

## 2018-11-06 LAB — COMPREHENSIVE METABOLIC PANEL
ALT: 58 U/L — ABNORMAL HIGH (ref 0–44)
AST: 126 U/L — ABNORMAL HIGH (ref 15–41)
Albumin: 3 g/dL — ABNORMAL LOW (ref 3.5–5.0)
Alkaline Phosphatase: 434 U/L — ABNORMAL HIGH (ref 38–126)
Anion gap: 9 (ref 5–15)
BUN: 21 mg/dL (ref 8–23)
CO2: 26 mmol/L (ref 22–32)
Calcium: 8.4 mg/dL — ABNORMAL LOW (ref 8.9–10.3)
Chloride: 106 mmol/L (ref 98–111)
Creatinine, Ser: 0.43 mg/dL — ABNORMAL LOW (ref 0.61–1.24)
GFR calc Af Amer: >60 mL/min (ref >60)
GFR calc non Af Amer: >60 mL/min (ref >60)
Glucose, Bld: 95 mg/dL (ref 70–99)
Potassium: 3.6 mmol/L (ref 3.5–5.1)
Sodium: 141 mmol/L (ref 135–145)
Total Bilirubin: 1.6 mg/dL — ABNORMAL HIGH (ref 0.3–1.2)
Total Protein: 5.6 g/dL — ABNORMAL LOW (ref 6.5–8.1)

## 2018-11-06 LAB — BPAM RBC
Blood Product Expiration Date: 202007162359
Blood Product Expiration Date: 202007162359
Blood Product Expiration Date: 202007162359
ISSUE DATE / TIME: 202007021210
ISSUE DATE / TIME: 202007022016
Unit Type and Rh: 6200
Unit Type and Rh: 6200
Unit Type and Rh: 6200

## 2018-11-06 LAB — CBC
HCT: 25.8 % — ABNORMAL LOW (ref 39.0–52.0)
Hemoglobin: 8.1 g/dL — ABNORMAL LOW (ref 13.0–17.0)
MCH: 30.8 pg (ref 26.0–34.0)
MCHC: 31.4 g/dL (ref 30.0–36.0)
MCV: 98.1 fL (ref 80.0–100.0)
Platelets: 198 10*3/uL (ref 150–400)
RBC: 2.63 MIL/uL — ABNORMAL LOW (ref 4.22–5.81)
RDW: 17.2 % — ABNORMAL HIGH (ref 11.5–15.5)
WBC: 7.9 10*3/uL (ref 4.0–10.5)
nRBC: 0.6 % — ABNORMAL HIGH (ref 0.0–0.2)

## 2018-11-06 MED ORDER — METOPROLOL TARTRATE 25 MG PO TABS
25.0000 mg | ORAL_TABLET | Freq: Once | ORAL | Status: AC
  Administered 2018-11-06: 25 mg via ORAL
  Filled 2018-11-06: qty 1

## 2018-11-06 MED ORDER — POTASSIUM CHLORIDE CRYS ER 20 MEQ PO TBCR
40.0000 meq | EXTENDED_RELEASE_TABLET | Freq: Once | ORAL | Status: AC
  Administered 2018-11-06: 40 meq via ORAL
  Filled 2018-11-06: qty 2

## 2018-11-06 MED ORDER — FUROSEMIDE 10 MG/ML IJ SOLN
40.0000 mg | Freq: Once | INTRAMUSCULAR | Status: AC
  Administered 2018-11-06: 40 mg via INTRAVENOUS
  Filled 2018-11-06: qty 4

## 2018-11-06 MED ORDER — METOPROLOL TARTRATE 50 MG PO TABS
100.0000 mg | ORAL_TABLET | Freq: Two times a day (BID) | ORAL | Status: DC
  Administered 2018-11-06: 100 mg via ORAL
  Filled 2018-11-06: qty 2

## 2018-11-06 NOTE — Care Management Important Message (Signed)
Important Message  Patient Details  Name: Antonio Manning MRN: [id number] Date of Birth: 1940-05-15   Medicare Important Message Given:  Yes. CMA printed out IM for the CSW or the Case Manager Nurse to give to the patient.      Deundre Thong 11/06/2018, 9:12 AM

## 2018-11-06 NOTE — TOC Initial Note (Signed)
Transition of Care [facility]) - Initial/Assessment Note    Patient Details  Name: Antonio Manning MRN: [id number] Date of Birth: 10-17-1940  Transition of Care [facility]) CM/SW Contact:    Joaquin Courts, RN Phone Number: 11/06/2018, 10:39 AM  Clinical Narrative:     CM spoke with patient at bedside who requests CM speak with spouse about home health needs.  CM spoke with spouse, [name], over the telephone who states that due to the [place] pandemic patient and spouse have been staying at home with their children delivering food and supplies to the home and leaving it on the porch. Wife states she is immunocompromised and cannot have anyone in the home at this time. At this time they will decline HH services and spouse states that patient has all necessary dme at home.  Spouse does report that she is on oxygen and should patient require oxygen as well they would prefer to use [employer] who supplies her oxygen and request that patient be evaluated for a portable light weight concentrator if O2 is needed.                  Expected Discharge Plan: Home/Self Care Barriers to Discharge: Continued Medical Work up   Patient Goals and CMS Choice Patient states their goals for this hospitalization and ongoing recovery are:: to go home      Expected Discharge Plan and Services Expected Discharge Plan: Home/Self Care   Discharge Planning Services: CM Consult   Living arrangements for the past 2 months: Single Family Home Expected Discharge Date: (unknown)               DME Arranged: N/A DME Agency: NA       HH Arranged: Patient Refused [place] Agency: NA        Prior Living Arrangements/Services Living arrangements for the past 2 months: Single Family Home Lives with:: Spouse Patient language and need for interpreter reviewed:: Yes Do you feel safe going back to the place where you live?: Yes      Need for Family Participation in Patient Care: Yes (Comment) Care giver support system in place?:  Yes (comment)   Criminal Activity/Legal Involvement Pertinent to Current Situation/Hospitalization: No - Comment as needed  Activities of Daily Living Home Assistive Devices/Equipment: Cane (specify quad or straight), Eyeglasses, Hearing aid, Walker (specify type)(bilateral hearing aids, single point cane, front wheeled walker) ADL Screening (condition at time of admission) Patient's cognitive ability adequate to safely complete daily activities?: Yes Is the patient deaf or have difficulty hearing?: Yes(wears bilateral hearing aids) Does the patient have difficulty seeing, even when wearing glasses/contacts?: No Does the patient have difficulty concentrating, remembering, or making decisions?: No Patient able to express need for assistance with ADLs?: Yes Does the patient have difficulty dressing or bathing?: No Independently performs ADLs?: Yes (appropriate for developmental age) Does the patient have difficulty walking or climbing stairs?: Yes(secondary to weakness and shortness of breath) Weakness of Legs: Both Weakness of Arms/Hands: Both  Permission Sought/Granted                  Emotional Assessment Appearance:: Appears stated age Attitude/Demeanor/Rapport: Engaged Affect (typically observed): Accepting Orientation: : Oriented to Self, Oriented to Place, Oriented to  Time, Oriented to Situation   Psych Involvement: No (comment)  Admission diagnosis:  weakness and diarrhea Patient Active Problem List   Diagnosis Date Noted  . Sepsis ([place]) 11/02/2018  . Lactic acidosis 11/02/2018  . High anion gap metabolic acidosis  11/02/2018  . Symptomatic anemia 11/02/2018  . Coffee ground emesis 11/02/2018  . Hypothyroidism 11/02/2018  . Atrial fibrillation with RVR ([place]) 11/02/2018  . Abdominal pain 11/02/2018  . Left hip pain 11/02/2018  . Goals of care, counseling/discussion 09/06/2018  . Hepatocellular carcinoma ([place]) 08/04/2018  . Pre-operative cardiovascular examination  07/06/2018  . PAF (paroxysmal atrial fibrillation) ([place]) 07/06/2018  . Liver lesion 07/06/2018  . Long term current use of anticoagulant 03/12/2018  . History of loop recorder 01/17/2016  . Paroxysmal atrial flutter ([place]) 11/25/2015  . Expressive aphasia 10/27/2015  . Cerebral thrombosis with cerebral infarction 10/27/2015  . Aphasia 10/27/2015  . History of CVA (cerebrovascular accident) 10/27/2015  . Thoracic aorta atherosclerosis ([place]) 04/04/2014  . Hyperlipidemia 04/04/2014  . Fatigue 10/30/2013  . Bradycardia 10/30/2013  . S/P left hip replacement 05/21/2011   PCP:  Hulan Fess, MD Pharmacy:   [id number] [address], [place] [address] Phone: [phone] Fax: [phone]     Social Determinants of Health ([place]) Interventions    Readmission Risk Interventions Readmission Risk Prevention Plan 11/06/2018  Transportation Screening Complete  PCP or Specialist Appt within 3-5 Days Not Complete  Not Complete comments not ready for d/c  HRI or [place] Complete  Social Work Consult for [place] Planning/Counseling Complete  Palliative Care Screening Not Applicable  Medication Review Press photographer) Complete  Some recent data might be hidden

## 2018-11-06 NOTE — Progress Notes (Signed)
[facility] PROGRESS NOTE Subjective Patient had EGD 7/3 with severe erosive esophagitis and gastritis.  Biopsies are still pending.  He now is eating a regular diet and currently is eating breakfast brought to him by his family is eaten about half of it.  He denies any pain with swallowing any signs of bleeding.  Objective: Vital signs in last 24 hours: Temp:  [97.7 F (36.5 C)-98.9 F (37.2 C)] 98.9 F (37.2 C) (07/06 0606) Pulse Rate:  [68-129] 68 (07/06 0606) Resp:  [14-20] 17 (07/06 0606) BP: (127-164)/(84-104) 150/95 (07/06 0606) SpO2:  [89 %-98 %] 96 % (07/06 0606) Last BM Date: 11/05/18  Intake/Output from previous day: 07/05 0701 - 07/06 0700 In: -  Out: 450 [Urine:450] Intake/Output this shift: No intake/output data recorded.  PE: General--patient sitting up in bed eating breakfast   Lab Results: Recent Labs    11/03/18 1618 11/04/18 0709 11/04/18 1652 11/05/18 0544 11/06/18 0520  WBC 7.9 5.3 6.0 5.9 7.9  HGB 8.1* 7.3* 7.3* 7.4* 8.1*  HCT 24.6* 22.6* 22.7* 23.1* 25.8*  PLT 190 155 160 165 198   BMET Recent Labs    11/04/18 0709 11/05/18 0544 11/06/18 0520  NA 138 142 141  K 2.7* 3.9 3.6  CL 101 107 106  CO2 25 26 26   CREATININE 0.52* 0.54* 0.43*   LFT Recent Labs    11/04/18 0709 11/05/18 0544 11/06/18 0520  PROT 5.7* 5.9* 5.6*  AST 679* 291* 126*  ALT 139* 89* 58*  ALKPHOS 717* 572* 434*  BILITOT 1.6* 1.7* 1.6*   PT/INR No results for input(s): LABPROT, INR in the last 72 hours. PANCREAS No results for input(s): LIPASE in the last 72 hours.       Studies/Results: No results found.  Medications: I have reviewed the patient's current medications.  Assessment:   1.  GI bleed.  Severe esophagitis and gastritis on EGD biopsies pending.  Patient on pantoprazole and sucralfate.  Appears to be doing quite well and eating without problems.   Plan: We will go ahead and keep him on pantoprazole probably indefinitely.   Could stop the sucralfate suspension in a week or so if no problems.  He has been started back on Eliquis.  We will check the biopsy reports later today [place] note.  We will sign off following him on a regular basis but please call us if there is any problem.   Nancy Fetter 11/06/2018, 8:50 AM  This note was created using voice recognition software. Minor errors may Have occurred unintentionally.  Pager: [phone] If no answer or after hours call [phone]

## 2018-11-06 NOTE — Progress Notes (Signed)
Patient has had multiple episodes today of calling out to the nurse saying that he was feeling like he "couldn't breathe." The nurse reassured him that he was ok and checked his oxygen level so he could see it as well to let him know that I was ok. Each time when he has called the nurse his oxygen level has been at 95% or 96%. This feeling doesn't seem to last very long with the patient. Will continue to monitor patient.  Timoteo Gaul, RN

## 2018-11-06 NOTE — Progress Notes (Signed)
Pt had 9 beat run of vtach. Patient asymptomatic, resting with no complaints. Now back in afib rate controlled 70s-90s. On-call NP [place] notified. Will continue to closely monitor.

## 2018-11-06 NOTE — Plan of Care (Signed)

## 2018-11-06 NOTE — Progress Notes (Addendum)
Progress Note  Patient Name: Antonio Manning Date of Encounter: 11/06/2018  Primary Cardiologist: Sanda Klein, MD   Subjective   Pt has just worked with PT. PT and RN state that pt was barely able to stand and was not able to walk any. RN says that pt's wife questions whether she can handle him at home.  Pt denies chest discomfort or lightheadedness. He does admit to some dyspnea with the exertion of trying to get up.   Inpatient Medications    Scheduled Meds: . amLODipine  2.5 mg Oral Daily  . amoxicillin-clavulanate  1 tablet Oral Q12H  . apixaban  5 mg Oral BID  . feeding supplement (ENSURE ENLIVE)  237 mL Oral TID BM  . levothyroxine  50 mcg Oral QAC breakfast  . mouth rinse  15 mL Mouth Rinse BID  . metoprolol tartrate  75 mg Oral BID  . multivitamin with minerals  1 tablet Oral Daily  . pantoprazole  40 mg Oral BID  . sucralfate  1 g Oral TID WC & HS  . vitamin C  250 mg Oral Daily   Continuous Infusions:  PRN Meds: acetaminophen **OR** acetaminophen, albuterol, ondansetron **OR** ondansetron (ZOFRAN) IV   Vital Signs    Vitals:   11/05/18 1547 11/05/18 1710 11/05/18 2133 11/06/18 0606  BP: 127/85 130/84 (!) 141/96 (!) 150/95  Pulse:  92 71 68  Resp:  19 18 17   Temp:  97.7 F (36.5 C) 97.7 F (36.5 C) 98.9 F (37.2 C)  TempSrc:   Oral Axillary  SpO2:  96% 96% 96%  Weight:      Height:        Intake/Output Summary (Last 24 hours) at 11/06/2018 1002 Last data filed at 11/05/2018 2142 Gross per 24 hour  Intake -  Output 150 ml  Net -150 ml   Last 3 Weights 11/05/2018 11/04/2018 11/03/2018  Weight (lbs) 166 lb 10.7 oz 161 lb 9.6 oz 154 lb 5.2 oz  Weight (kg) 75.6 kg 73.3 kg 70 kg      Telemetry    Sinus rhythm, sinus tach with frequent PAC's. One 9 beat run of NSVT,  - Personally Reviewed  ECG    Sinus tach with PACs, machine read as afib but p waves present for over half of beats. LAD, Low voltage QRS, Septal infarct , age undetermined Possible Lateral  infarct , age undetermined Inferior infarct , age undetermined - Personally Reviewed  Physical Exam   GEN: No acute distress.   Neck: No JVD Cardiac: RRR, no murmurs, rubs, or gallops.  Respiratory: Clear to auscultation bilaterally. GI: Soft, nontender, non-distended  MS: No edema; No deformity. Neuro:  Nonfocal  Psych: Normal affect   Labs    High Sensitivity Troponin:   Recent Labs  Lab 11/02/18 1020 11/02/18 1527 11/02/18 1805  TROPONINIHS 10.0 25.0* 36.0*      Cardiac EnzymesNo results for input(s): TROPONINI in the last 168 hours. No results for input(s): TROPIPOC in the last 168 hours.   Chemistry Recent Labs  Lab 11/04/18 0709 11/05/18 0544 11/06/18 0520  NA 138 142 141  K 2.7* 3.9 3.6  CL 101 107 106  CO2 25 26 26   GLUCOSE 79 87 95  BUN 19 22 21   CREATININE 0.52* 0.54* 0.43*  CALCIUM 7.9* 8.3* 8.4*  PROT 5.7* 5.9* 5.6*  ALBUMIN 3.5 3.3* 3.0*  AST 679* 291* 126*  ALT 139* 89* 58*  ALKPHOS 717* 572* 434*  BILITOT 1.6* 1.7* 1.6*  GFRNONAA >60 >60 >60  GFRAA >60 >60 >60  ANIONGAP 12 9 9      Hematology Recent Labs  Lab 11/04/18 1652 11/05/18 0544 11/06/18 0520  WBC 6.0 5.9 7.9  RBC 2.35* 2.37* 2.63*  HGB 7.3* 7.4* 8.1*  HCT 22.7* 23.1* 25.8*  MCV 96.6 97.5 98.1  MCH 31.1 31.2 30.8  MCHC 32.2 32.0 31.4  RDW 16.8* 17.3* 17.2*  PLT 160 165 198    BNPNo results for input(s): BNP, PROBNP in the last 168 hours.   DDimer No results for input(s): DDIMER in the last 168 hours.   Radiology    No results found.  Cardiac Studies    Echocardiogram 11/03/2018 IMPRESSIONS   1. The left ventricle has mildly reduced systolic function, with an ejection fraction of 45-50%. The cavity size was normal. There is mildly increased left ventricular wall thickness. Left ventricular diastolic Doppler parameters are consistent with  impaired relaxation. Indeterminate filling pressures The E/e' is 8-15. There is abnormal septal motion consistent with left  bundle branch block.  2. The right ventricle has normal systolic function. The cavity was normal. There is no increase in right ventricular wall thickness.  3. The mitral valve is abnormal. Mild thickening of the mitral valve leaflet. Mitral valve regurgitation is mild to moderate by color flow Doppler.  4. The tricuspid valve is grossly normal.  5. The aortic valve is tricuspid. Mild sclerosis of the aortic valve. Aortic valve regurgitation is trivial by color flow Doppler. No stenosis of the aortic valve.  6. There is mild to moderate dilatation of the ascending aorta measuring 44 mm.  7. The average left ventricular global longitudinal strain is -12.0 %.  SUMMARY   LVEF 45-50%, mild LVH, global hypokinesis and incoordinate septal motion, grade 1 DD, indeterminate LV filling pressure, normal biatrial size, mild to moderate MR, trivial AI, trivial TR, IVC not visualized, no obvious atrial level shunt, abnormal GLS at -12% with predominant septal strain abnormality  FINDINGS  Left Ventricle: The left ventricle has mildly reduced systolic function, with an ejection fraction of 45-50%. The cavity size was normal. There is mildly increased left ventricular wall thickness. Left ventricular diastolic Doppler parameters are  consistent with impaired relaxation. Indeterminate filling pressures The E/e' is 8-15. There is abnormal (paradoxical) septal motion, consistent with left bundle branch block. The average left ventricular global longitudinal strain is -12.0 %.  Right Ventricle: The right ventricle has normal systolic function. The cavity was normal. There is no increase in right ventricular wall thickness. Left Atrium: Left atrial size was normal in size. Right Atrium: Right atrial size was normal in size. Right atrial pressure is estimated at 8 mmHg. Interatrial Septum: No atrial level shunt detected by color flow Doppler. Pericardium: There is no evidence of pericardial effusion. Mitral  Valve: The mitral valve is abnormal. Mild thickening of the mitral valve leaflet. Mitral valve regurgitation is mild to moderate by color flow Doppler. Tricuspid Valve: The tricuspid valve is grossly normal. Tricuspid valve regurgitation is trivial by color flow Doppler. Aortic Valve: The aortic valve is tricuspid Mild sclerosis of the aortic valve. Aortic valve regurgitation is trivial by color flow Doppler. There is No stenosis of the aortic valve. Pulmonic Valve: The pulmonic valve was grossly normal. Pulmonic valve regurgitation is not visualized by color flow Doppler. Aorta: There is mild to moderate dilatation of the ascending aorta measuring 44 mm. Venous: The inferior vena cava was not well visualized.  Echo 12/27/17: Study Conclusions  - Left  ventricle: The cavity size was normal. Systolic function was normal. The estimated ejection fraction was in the range of 55% to 60%. Wall motion was normal; there were no regional wall motion abnormalities. Doppler parameters are consistent with abnormal left ventricular relaxation (grade 1 diastolic dysfunction). Doppler parameters are consistent with both elevated ventricular end-diastolic filling pressure and elevated left atrial filling pressure. - Aortic valve: There was trivial regurgitation. - Mitral valve: Mild, late systolicprolapse, involving the posterior leaflet. There was mild regurgitation.   ETT 12/27/17:  Blood pressure demonstrated a normal response to exercise.  There was no ST segment deviation noted during stress.  ETT with normal exercise tolerance (6:10); no chest pain; normal BP response; no ST changes; negative adequate ETT; Duke treadmill score 6.   Patient Profile     78 y.o. male with a hx of cryptogenic stroke and subsequent diagnosis of atrial fibrillation with low burden by ILR (Dr. Sallyanne Kuster), ETT negative for ischemia (2019), and chronic diastolic heart failurewho is being seen for  the evaluation of possible Afib RVR in the setting of GI bleedand sepsis in the settings of acute GIB and pneumonia.   Assessment & Plan    1. Sinus tachycardia with frequent ectopy -EKGs and tele reviewed. Appears to be sinus rhythm with occ sinus tach with frequent PACs. No sustained atrial fib noted. He did have 9 beats of probable NSVT.  -He has been restarted on his Eliquis for stroke risk reduction with prior PAF and stroke.  -Echo shows mildly reduced LV systolic function with EF 45-50%, down from 55-60% in 12/2017. Also abnormal septal motion consistent with left bundle branch block. -Reduced EF may be tachycardia mediated.  -BP is on the high side. 150/95 this am. Will increase metoprolol to 100 mg BID for better rate control and to attempt to reduce ectopy. -Since afib is not new for him, he is anticoagulated and does not appear to be having sustained afib at present, can be discharged from a cardiology standpoint when he is medically ready. -Will arrange for outpatient follow up in our office.   2. Hypertension -Home amlodipine 2.5 mg resumed yesterday.  -BP elevated. Will increase metoprolol as above.   3. Hypothyroidism -TSH elevated at 29. Free T4 normal. T3 low at 1.1. Per primary, possible abnormal TSH related to acute illness/cancer. Deferring to PCP for any adjustment in medication.   4. Acute upper GI bleed. -S/P transfusion of 2 Units PRBCs. Hgb 6.6 >>8.1, now stable.  -Severe esophagitis and mild gastritis on EGD. Placed on carafate for 2 weeks. Continued on PPI.  -For outpatient GI follow up.   5. Ascending aortic aneurysm -ascending aorta measuring 44 mm on echo 11/03/18. -Chest CT 08/13/18, 48 mm. Recommendation for semi annual imaging. Follow up with Dr. Sallyanne Kuster as outpatient.       For questions or updates, please contact [place] Please consult [email or web address] for contact info under   History and all data above reviewed.  Patient examined.  I agree with  the findings as above.   He is weak and had diarrhea.  No pain.  No SOB.  He does not feel his palpitations.  The patient exam reveals HER:[id number]  ,  Lungs: Clear  ,  Abd: Positive bowel sounds, no rebound no guarding, Ext No edema  .  All available labs, radiology testing, previous records reviewed. Agree with documented assessment and plan.   Atrial fib:  He has lots of atrial ectopy with atrial tach.  Some might  be short bursts of fib but this does not change the plan.  OK to increase beta blocker to see if he tolerates this.  Otherwise, continue current anticoagulation.    [place]opolis Sloop  11:10 AM  11/06/2018       Signed, Daune Perch, NP  11/06/2018, 10:02 AM

## 2018-11-06 NOTE — Progress Notes (Signed)
Occupational Therapy Treatment Patient Details Name: Antonio Manning MRN: [id number] DOB: 01-13-1941 Today's Date: 11/06/2018    History of present illness 78 year old man admitted wtih weakness and coffee ground emesis.  H/O HCC on chemo, CVA with expressive aphasia, PAF, and hearing loss. Pt with L THA 2013, pt reports fall and "dislocating hip" but imaging obtained 7/2 WNL.   OT comments  Pt has limited A at home.  Wife is limited.  Per note wife declining HH as wife does not others in the home.  Pt may need ST SNF.  Son comes from [place] to A - but feel pt will need more A than that   Follow Up Recommendations  SNF;Supervision/Assistance - 24 hour;Home health OT    Equipment Recommendations  None recommended by OT    Recommendations for Other Services      Precautions / Restrictions Precautions Precautions: Fall Precaution Comments: watch HR Restrictions Weight Bearing Restrictions: No       Mobility Bed Mobility   Bed Mobility: Rolling Rolling: Min assist   Supine to sit: Mod assist     General bed mobility comments: upon attempted to sit EOB pt then needed to return to bed  Transfers            NT          Balance Overall balance assessment: Needs assistance;History of Falls Sitting-balance support: No upper extremity supported Sitting balance-Leahy Scale: Good     Standing balance support: Bilateral upper extremity supported Standing balance-Leahy Scale: Poor Standing balance comment: reliant on UE support                           ADL either performed or assessed with clinical judgement   ADL Overall ADL's : Needs assistance/impaired                           Toilet Transfer Details (indicate cue type and reason): attempted to get OOB but pt felt he was going to have diarrhea and decided on bed pan.  Pt very weak this day.           General ADL Comments: pts wife not able to A him at home .  Do not feel at a level at  this time in which he can safely go home     Vision Baseline Vision/History: No visual deficits            Cognition Arousal/Alertness: Awake/alert Behavior During Therapy: WFL for tasks assessed/performed Overall Cognitive Status: Within Functional Limits for tasks assessed                                                     Pertinent Vitals/ Pain       Pain Assessment: No/denies pain         Frequency  Min 2X/week        Progress Toward Goals  OT Goals(current goals can now be found in the care plan section)  Progress towards OT goals: Not progressing toward goals - comment(fatigue and diarrhea limiting)  Acute Rehab OT Goals Patient Stated Goal: home OT Goal Formulation: With patient  Plan Discharge plan needs to be updated       AM-PAC OT "6 Clicks" Daily Activity  Outcome Measure   Help from another person eating meals?: None Help from another person taking care of personal grooming?: A Little Help from another person toileting, which includes using toliet, bedpan, or urinal?: A Lot Help from another person bathing (including washing, rinsing, drying)?: A Lot Help from another person to put on and taking off regular upper body clothing?: A Little Help from another person to put on and taking off regular lower body clothing?: A Lot 6 Click Score: 16    End of Session    OT Visit Diagnosis: Muscle weakness (generalized) (M62.81)   Activity Tolerance Patient limited by fatigue;Other (comment)(limited by diarrhea)   Patient Left in bed;with call bell/phone within reach;with bed alarm set;with nursing/sitter in room   Nurse Communication Mobility status        Time: 1055-1110 OT Time Calculation (min): 15 min  Charges: OT General Charges $OT Visit: 1 Visit OT Treatments $Self Care/Home Management : 8-22 mins  Lise Auer, OT Acute Rehabilitation Services [phone] Office- [phone]      Walaa Carel,  Karin Golden D 11/06/2018, 12:58 PM

## 2018-11-06 NOTE — Progress Notes (Signed)
PROGRESS NOTE   Antonio Manning  URK:[id number]    DOB: 05-27-1940    DOA: 11/02/2018  PCP: Hulan Fess, MD   I have briefly reviewed patients previous medical records in [facility].  Chief Complaint  Patient presents with  . Weakness    Brief Narrative:  78 year old male with PMH of stage III hepatocellular carcinoma currently on immunotherapy and Avastin, HTN, HLD, hard of hearing, cryptogenic stroke, paroxysmal atrial fibrillation on Eliquis, presented to the ED on 11/02/2018 due to weakness, dyspnea and coffee-ground emesis.  In the ED, patient met sepsis criteria including hypothermia, elevated lactate, tachypnea and tachycardia, initiated on broad-spectrum antibiotics.  He also had acute blood loss anemia/hemoglobin 6.6 due to acute upper GI bleed.  Admitted to stepdown unit for sepsis, acute respiratory failure with hypoxia, elevated lactate, acute upper GI bleed with ABLA and A. fib with RVR.  [facility], cardiology and medical oncology consulted.  Status post EGD 7/3.  Hospital course complicated by acute confusion overnight 7/3, possibly related to medications (Remeron and tramadol), resolved.  On 7/6, weak, dyspneic, hypoxic, suspect pulmonary edema.   Assessment & Plan:   Principal Problem:   Sepsis ([place]) Active Problems:   S/P left hip replacement   Fatigue   Hyperlipidemia   History of CVA (cerebrovascular accident)   PAF (paroxysmal atrial fibrillation) (HCC)   Liver lesion   Hepatocellular carcinoma (HCC)   Lactic acidosis   High anion gap metabolic acidosis   Symptomatic anemia   Coffee ground emesis   Hypothyroidism   Atrial fibrillation with RVR (HCC)   Abdominal pain   Left hip pain   Sepsis  Present on admission (hypothermia, tachycardia, leukocytosis and elevated lactate)  Could be related to aspiration during emesis episodes.  CT abdomen shows right lower lobe atelectasis versus pneumonia.  Blood cultures x2: Negative to date.  SARS coronavirus 2:  Negative.  Urine culture negative.  MRSA PCR negative.  Procalcitonin 1.01 > 1.36.  Lactate has normalized.  He was treated per sepsis criteria with IV fluids and started on broad-spectrum IV antibiotics including IV vancomycin and cefepime.  Given concern for possible aspiration pneumonia, negative MRSA PCR, discontinued IV vancomycin and cefepime and changed to IV Unasyn.  At discharge may consider oral Augmentin.  Sepsis physiology resolved.  No high fevers.  Blood cultures continue to remain negative to date.  Changed IV Unasyn to Augmentin and complete total 7 days course.  If has worsening diarrhea with Augmentin, consider stopping oral antibiotics.  Acute respiratory failure with hypoxia  Secondary to pneumonia/atelectasis.  Now persistent due to pulmonary edema.  Dyspnea on minimal exertion such as standing up and hypoxic.  Treat for CHF as below and follow O2 requirements.  Acute systolic CHF  Patient not on diuretics PTA.  TTE 7/3: LVEF 45-50%.  This cardiomyopathy may be rate related versus other etiologies.  Weight gain of approximately 22 pounds since admission from volume resuscitation.  Chest x-ray personally reviewed: Suggestive of mild pulmonary edema and atelectasis.  IV Lasix 40 mg x 1, strict intake output and monitor weights closely.  Reassess in a.m.  Acute upper GI bleed/severe erosive esophagitis and mild gastritis  He was treated supportively with bowel rest/n.p.o., IV Protonix drip, IV octreotide drip.  Interestingly FOBT was negative.  [facility] was consulted and underwent EGD 7/3 with findings as noted below.  No overt GI bleed.  Tolerating regular consistency diet, twice daily PPI indefinitely and sucralfate for 2 weeks.  Outpatient follow-up  with GI with biopsies.  Acute blood loss anemia/symptomatic  Secondary to acute upper GI bleed.  S/p 2 units PRBC and hemoglobin has improved from 6.6 on admission to 8.1 range.  Transfuse as needed for  hemoglobin 7 or less.  Eliquis which was held due to GI bleed resumed on 7/5.  Hemoglobin up to 8.1 today.  Continue to monitor serial CBCs.  Lactic acidosis  Secondary to sepsis, acute upper GI bleed, acute blood loss anemia  Treated as above and resolved.  High anion gap metabolic acidosis  Anion gap 33 on admission.  Resolved.  Hypothyroidism  TSH 29.022, free T4: 0.97.  Free T3: 1.1.  Currently on Synthroid 50 mcg daily.  ?  Abnormal TSH related to acute illness/cancer.  Clinically appears euthyroid.  Continue current dose of Synthroid, defer to PCP regarding adjusting dose and consider repeating TSH in 4 weeks.  Also hesitant to increase his Synthroid dose given issues with sinus tachycardia/A. fib RVR.  Sinus tachycardia with frequent PACs/history of A. fib.  Cardiology continued to assist.  Briefly required IV Cardizem on admission.   Cardiology have seen today when increase metoprolol to 100 mg twice daily.  Eliquis was held due to active GI bleed and anemia.  Now that hemoglobin is stable and no overt GI bleed, resumed Eliquis on 7/5 with close CBC monitoring.  Cardiomyopathy  TTE 7/3: LVEF 45-50%.  No chest pain reported.  Management per cardiology.  Elevated troponin  Likely due to demand ischemia from acute illness discussed above rather than due to ACS.  Essential hypertension  Metoprolol 100 mg twice daily.  Continue home amlodipine 2.5 mg daily.  Stage III hepatocellular carcinoma  Oncology input appreciated.  Currently on atezolizumab (immunotherapy) and Avastin, last dose 6/24.  LFTs have worsened 2 days ago but continued to improve.?  Reactive  History of CVA  Eliquis resumed.  Hyperlipidemia  Continue atorvastatin.  Abdominal pain  Present on admission.  Likely related to esophagitis now seen on EGD.  Stable hepatic metastatic lesions and moderate ascites.  Abdominal pain may be related to his Violet.  Left hip pain  X-ray without  fractures or acute findings.  Hypomagnesemia  Replaced.  Hypokalemia  Replaced.    Acute toxic metabolic encephalopathy  Overnight 7/3 patient had confusion, agitation and had to be restrained.  Possibly due to medications i.e. Remeron which she was not taking at home and tramadol, discontinued.  Probably resolved now.  Avoid physical restraints.  If he gets agitated again, try to get a Air cabin crew.  Also to call patient's youngest son who can face time the patient and try to reorient him and calm him down.  Ascending aortic aneurysm Ascending aorta measuring 44 mm on echo 7/3, chest CT 4/12: 48 mm.  Recommend outpatient follow-up.  NSVT  Continue metoprolol and monitor on telemetry.   DVT prophylaxis: SCDs, Eliquis Code Status: Full Family Communication: Discussed in detail with patient's youngest son on 7/6, updated care and answered questions.  He indicates that patient and family will not want SNF and to not bring this topic up with the patient, the plan will be for patient to return home and the family will make arrangements to manage his care at home. Disposition: Patient not medically ready for discharge today.  As per chart review, patient is weak and unable to participate with therapies today.  Also spouse indicates that she may not be able to manage his needs at home and will not allow home health  services due to pandemic restrictions and her immunocompromise status.  May need to consider SNF.   Consultants:  [facility] Medical Oncology  Procedures:  EGD 3/7  Antimicrobials:  IV cefepime and vancomycin 7/2-7/3 IV Unasyn 7/3 > 7/5 Augmentin 7/6 >   Subjective: When seen earlier this morning patient denied complaints.  He denied dyspnea, pain.  Had 2 BMs earlier in the morning, small volume with no blood or black stools.  Subsequently as per chart review and discussion with nursing, patient weak and unable to stand up, has had total of 4-5  small-volume stools since this morning which are soft/loose.  No pain reported.  ROS: As above, otherwise negative.  Objective:  Vitals:   11/06/18 0606 11/06/18 1006 11/06/18 1052 11/06/18 1055  BP: (!) 150/95     Pulse: 68     Resp: 17     Temp: 98.9 F (37.2 C)     TempSrc: Axillary     SpO2: 96% 92% (!) 88% 90%  Weight:      Height:        Examination:  General exam: Pleasant elderly male, moderately built and nourished lying comfortably supine in bed.  He did not appear in any distress. Respiratory system: Diminished breath sounds in the bases with occasional basal crackles but otherwise clear to auscultation without wheezing or rhonchi.  No increased work of breathing. Cardiovascular system: S1 & S2 heard, RRR. No JVD, murmurs, rubs, gallops or clicks.  Trace bilateral ankle edema.  Telemetry personally reviewed: Sinus rhythm with frequent PACs.  Had brief.  Of ST in the 110s-130s and an episode of 9 beat NSVT which was reviewed with cardiology team. Gastrointestinal system: Abdomen is nondistended, soft and nontender.  Palpable nonpulsatile mass in the epigastric region which could be his HCC.  Normal bowel sounds heard.  Stable. Central nervous system: Alert and oriented x4. No focal neurological deficits. Extremities: Symmetric 5 x 5 power.  Hard of hearing. Skin: No rashes, lesions or ulcers Psychiatry: Judgement and insight appears appear intact. Mood & affect appropriate.     Data Reviewed: I have personally reviewed following labs and imaging studies  CBC: Recent Labs  Lab 11/02/18 1614  11/03/18 1618 11/04/18 0709 11/04/18 1652 11/05/18 0544 11/06/18 0520  WBC 10.1   < > 7.9 5.3 6.0 5.9 7.9  NEUTROABS 8.4*  --   --   --   --   --   --   HGB 7.4*   < > 8.1* 7.3* 7.3* 7.4* 8.1*  HCT 23.7*   < > 24.6* 22.6* 22.7* 23.1* 25.8*  MCV 100.0   < > 95.7 95.8 96.6 97.5 98.1  PLT 249   < > 190 155 160 165 198   < > = values in this interval not displayed.   Basic  Metabolic Panel: Recent Labs  Lab 11/02/18 0827 11/02/18 1527 11/03/18 0816 11/04/18 0709 11/05/18 0544 11/06/18 0520  NA 140 141 138 138 142 141  K 3.6 4.1 3.6 2.7* 3.9 3.6  CL 96* 102 103 101 107 106  CO2 11* 21* 24 25 26 26   GLUCOSE 143* 120* 90 79 87 95  BUN 23 23 20 19 22 21   CREATININE 1.14 0.88 0.54* 0.52* 0.54* 0.43*  CALCIUM 9.3 8.6* 8.0* 7.9* 8.3* 8.4*  MG 2.0 1.5*  --  2.1  --   --    Liver Function Tests: Recent Labs  Lab 11/02/18 0827 11/02/18 1527 11/04/18 0709 11/05/18 0544 11/06/18 0520  AST 69* 239* 679* 291* 126*  ALT 21 82* 139* 89* 58*  ALKPHOS 120 131* 717* 572* 434*  BILITOT 1.2 1.2 1.6* 1.7* 1.6*  PROT 6.8 6.3* 5.7* 5.9* 5.6*  ALBUMIN 4.2 4.1 3.5 3.3* 3.0*   Coagulation Profile: Recent Labs  Lab 11/02/18 0830  INR 1.7*   Cardiac Enzymes: Recent Labs  Lab 11/02/18 0827  CKTOTAL 24*   HbA1C: No results for input(s): HGBA1C in the last 72 hours. CBG: Recent Labs  Lab 11/02/18 0828 11/03/18 0739  GLUCAP 119* 86    Recent Results (from the past 240 hour(s))  SARS Coronavirus 2 (CEPHEID- Performed in [facility] hospital lab), Hosp Order     Status: None   Collection Time: 11/02/18  8:27 AM   Specimen: Nasopharyngeal Swab  Result Value Ref Range Status   SARS Coronavirus 2 NEGATIVE NEGATIVE Final    Comment: (NOTE) If result is NEGATIVE SARS-CoV-2 target nucleic acids are NOT DETECTED. The SARS-CoV-2 RNA is generally detectable in upper and lower  respiratory specimens during the acute phase of infection. The lowest  concentration of SARS-CoV-2 viral copies this assay can detect is 250  copies / mL. A negative result does not preclude SARS-CoV-2 infection  and should not be used as the sole basis for treatment or other  patient management decisions.  A negative result may occur with  improper specimen collection / handling, submission of specimen other  than nasopharyngeal swab, presence of viral mutation(s) within the  areas  targeted by this assay, and inadequate number of viral copies  (<250 copies / mL). A negative result must be combined with clinical  observations, patient history, and epidemiological information. If result is POSITIVE SARS-CoV-2 target nucleic acids are DETECTED. The SARS-CoV-2 RNA is generally detectable in upper and lower  respiratory specimens dur ing the acute phase of infection.  Positive  results are indicative of active infection with SARS-CoV-2.  Clinical  correlation with patient history and other diagnostic information is  necessary to determine patient infection status.  Positive results do  not rule out bacterial infection or co-infection with other viruses. If result is PRESUMPTIVE POSTIVE SARS-CoV-2 nucleic acids MAY BE PRESENT.   A presumptive positive result was obtained on the submitted specimen  and confirmed on repeat testing.  While 2019 novel coronavirus  (SARS-CoV-2) nucleic acids may be present in the submitted sample  additional confirmatory testing may be necessary for epidemiological  and / or clinical management purposes  to differentiate between  SARS-CoV-2 and other Sarbecovirus currently known to infect humans.  If clinically indicated additional testing with an alternate test  methodology [phone]) is advised. The SARS-CoV-2 RNA is generally  detectable in upper and lower respiratory sp ecimens during the acute  phase of infection. The expected result is Negative. Fact Sheet for Patients:  StrictlyIdeas.no Fact Sheet for Healthcare Providers: BankingDealers.co.za This test is not yet approved or cleared by the [place] FDA and has been authorized for detection and/or diagnosis of SARS-CoV-2 by FDA under an Emergency Use Authorization (EUA).  This EUA will remain in effect (meaning this test can be used) for the duration of the COVID-19 declaration under Section 564(b)(1) of the Act, 21 U.S.C. section  360bbb-3(b)(1), unless the authorization is terminated or revoked sooner. Performed at [facility], [place] [address], [place], [place] [address]   Blood culture (routine x 2)     Status: None (Preliminary result)   Collection Time: 11/02/18  8:30 AM   Specimen:  BLOOD  Result Value Ref Range Status   Specimen Description   Final    BLOOD BLOOD RIGHT FOREARM Performed at [facility] [address] [place], [place] [address]    Special Requests   Final    BOTTLES DRAWN AEROBIC AND ANAEROBIC Blood Culture adequate volume Performed at [facility] [address], [place], [place] [address]    Culture   Final    NO GROWTH 3 DAYS Performed at [facility], [place] [address], [place], [place] [address]    Report Status PENDING  Incomplete  Urine culture     Status: None   Collection Time: 11/02/18 12:00 PM   Specimen: Urine, Clean Catch  Result Value Ref Range Status   Specimen Description   Final    URINE, CLEAN CATCH Performed at [facility], [place] [address], [place], [place] [address]    Special Requests   Final    NONE Performed at [facility], [place] [address], [place], [place] [address]    Culture   Final    NO GROWTH Performed at [facility], [place] [address], [place], [place] [address]    Report Status 11/03/2018 FINAL  Final  MRSA PCR Screening     Status: None   Collection Time: 11/02/18 12:05 PM   Specimen: Nasopharyngeal  Result Value Ref Range Status   MRSA by PCR NEGATIVE NEGATIVE Final    Comment:        The GeneXpert MRSA Assay (FDA approved for NASAL specimens only), is one component of a comprehensive MRSA colonization surveillance program. It is not intended to diagnose MRSA infection nor to guide or monitor treatment for MRSA infections. Performed at [facility], [place] [address] [place], [place] [address]   Blood culture  (routine x 2)     Status: None (Preliminary result)   Collection Time: 11/02/18  3:27 PM   Specimen: BLOOD  Result Value Ref Range Status   Specimen Description   Final    BLOOD LEFT ARM Performed at [facility] [address], [place], [place] [address]    Special Requests   Final    BOTTLES DRAWN AEROBIC ONLY Blood Culture adequate volume Performed at [facility] [address] [place], [place] [address]    Culture   Final    NO GROWTH 3 DAYS Performed at [facility], [place] [address], [place],  [address]    Report Status PENDING  Incomplete         Radiology Studies: Dg Chest Port 1 View  Result Date: 11/06/2018 CLINICAL DATA:  Shortness of breath EXAM: PORTABLE CHEST 1 VIEW COMPARISON:  11/03/2018 FINDINGS: Cardiomegaly. Aortic atherosclerosis. Enlargement of bilateral effusions with lower lobe atelectasis. Loop recorder in place. Upper lungs are clear. IMPRESSION: Worsened congestive heart failure with enlarging effusions and lower lobe atelectasis. Electronically Signed   By: Nelson Chimes M.D.   On: 11/06/2018 12:38        Scheduled Meds: . amLODipine  2.5 mg Oral Daily  . amoxicillin-clavulanate  1 tablet Oral Q12H  . apixaban  5 mg Oral BID  . feeding supplement (ENSURE ENLIVE)  237 mL Oral TID BM  . furosemide  40 mg Intravenous Once  . levothyroxine  50 mcg Oral QAC breakfast  . mouth rinse  15 mL Mouth Rinse BID  . metoprolol tartrate  100 mg Oral BID  . multivitamin  with minerals  1 tablet Oral Daily  . pantoprazole  40 mg Oral BID  . potassium chloride  40 mEq Oral Once  . sucralfate  1 g Oral TID WC & HS  . vitamin C  250 mg Oral Daily   Continuous Infusions:    LOS: 4 days     Vernell Leep, MD, FACP, [facility]. [facility]  To contact the attending provider between 7A-7P or the covering provider during after hours 7P-7A, please log into the web site [email or web address] and access using  universal [place] password for that web site. If you do not have the password, please call the hospital operator.  11/06/2018, 1:01 PM

## 2018-11-06 NOTE — Progress Notes (Signed)
RN & NT went in room to try to get patient up to walk and check oxygen while ambulating. Patient was able to sit on the side of the bed but when we used the walker to stand him, he was unable to stand more than 10 seconds and had to sit back down. Patient said he was "weak" and felt like he "couldn't breath" and sat back down. Patient then laid down and had a loose bowel movement and barely made it onto the bedpan. OT came into the room to try to get patient up while RN was in the room still and patient said he couldn't do it and couldn't make it to the [facility].   RN talked with patient's wife and she is refusing home health PT right now because she doesn't want anyone in the house. She has a walker and a bathroom chair at home if needed but says she has a lung disease and can't help him do much. If patient is unable to get up, i'm worried about him falling at home again.   OT said she would come back by to see him again later.  Will continue to monitor patient.

## 2018-11-07 ENCOUNTER — Encounter (HOSPITAL_COMMUNITY): Payer: Self-pay | Admitting: Gastroenterology

## 2018-11-07 ENCOUNTER — Telehealth: Payer: Self-pay | Admitting: *Deleted

## 2018-11-07 ENCOUNTER — Other Ambulatory Visit: Payer: Self-pay

## 2018-11-07 LAB — CBC
HCT: 28 % — ABNORMAL LOW (ref 39.0–52.0)
Hemoglobin: 9 g/dL — ABNORMAL LOW (ref 13.0–17.0)
MCH: 30.8 pg (ref 26.0–34.0)
MCHC: 32.1 g/dL (ref 30.0–36.0)
MCV: 95.9 fL (ref 80.0–100.0)
Platelets: 247 10*3/uL (ref 150–400)
RBC: 2.92 MIL/uL — ABNORMAL LOW (ref 4.22–5.81)
RDW: 17 % — ABNORMAL HIGH (ref 11.5–15.5)
WBC: 9.7 10*3/uL (ref 4.0–10.5)
nRBC: 1.6 % — ABNORMAL HIGH (ref 0.0–0.2)

## 2018-11-07 LAB — COMPREHENSIVE METABOLIC PANEL
ALT: 41 U/L (ref 0–44)
AST: 68 U/L — ABNORMAL HIGH (ref 15–41)
Albumin: 3 g/dL — ABNORMAL LOW (ref 3.5–5.0)
Alkaline Phosphatase: 350 U/L — ABNORMAL HIGH (ref 38–126)
Anion gap: 10 (ref 5–15)
BUN: 22 mg/dL (ref 8–23)
CO2: 24 mmol/L (ref 22–32)
Calcium: 8.3 mg/dL — ABNORMAL LOW (ref 8.9–10.3)
Chloride: 105 mmol/L (ref 98–111)
Creatinine, Ser: 0.44 mg/dL — ABNORMAL LOW (ref 0.61–1.24)
GFR calc Af Amer: >60 mL/min (ref >60)
GFR calc non Af Amer: >60 mL/min (ref >60)
Glucose, Bld: 105 mg/dL — ABNORMAL HIGH (ref 70–99)
Potassium: 3 mmol/L — ABNORMAL LOW (ref 3.5–5.1)
Sodium: 139 mmol/L (ref 135–145)
Total Bilirubin: 1.9 mg/dL — ABNORMAL HIGH (ref 0.3–1.2)
Total Protein: 5.7 g/dL — ABNORMAL LOW (ref 6.5–8.1)

## 2018-11-07 LAB — SAMPLE TO BLOOD BANK

## 2018-11-07 LAB — PROCALCITONIN: Procalcitonin: 5.39 ng/mL

## 2018-11-07 LAB — CULTURE, BLOOD (ROUTINE X 2)
Culture: NO GROWTH
Culture: NO GROWTH
Special Requests: ADEQUATE
Special Requests: ADEQUATE

## 2018-11-07 LAB — LACTIC ACID, PLASMA
Lactic Acid, Venous: 3.9 mmol/L (ref 0.5–1.9)
Lactic Acid, Venous: 3 mmol/L (ref 0.5–1.9)

## 2018-11-07 MED ORDER — PIPERACILLIN-TAZOBACTAM 3.375 G IVPB
3.3750 g | Freq: Three times a day (TID) | INTRAVENOUS | Status: DC
  Administered 2018-11-07: 3.375 g via INTRAVENOUS
  Filled 2018-11-07: qty 50

## 2018-11-07 MED ORDER — ALBUMIN HUMAN 25 % IV SOLN
50.0000 g | Freq: Four times a day (QID) | INTRAVENOUS | Status: DC
  Administered 2018-11-07 – 2018-11-08 (×3): 50 g via INTRAVENOUS
  Filled 2018-11-07 (×4): qty 200

## 2018-11-07 MED ORDER — PIPERACILLIN-TAZOBACTAM 3.375 G IVPB 30 MIN
3.3750 g | Freq: Four times a day (QID) | INTRAVENOUS | Status: AC
  Administered 2018-11-07 – 2018-11-08 (×2): 3.375 g via INTRAVENOUS
  Filled 2018-11-07 (×4): qty 50

## 2018-11-07 MED ORDER — POTASSIUM CHLORIDE CRYS ER 20 MEQ PO TBCR
40.0000 meq | EXTENDED_RELEASE_TABLET | ORAL | Status: AC
  Administered 2018-11-07 (×2): 40 meq via ORAL
  Filled 2018-11-07 (×2): qty 2

## 2018-11-07 MED ORDER — METOPROLOL TARTRATE 5 MG/5ML IV SOLN: 2.5000 mg | INTRAVENOUS | Status: DC | PRN

## 2018-11-07 MED ORDER — PIPERACILLIN-TAZOBACTAM 3.375 G IVPB
3.3750 g | Freq: Three times a day (TID) | INTRAVENOUS | Status: DC
  Administered 2018-11-08 – 2018-11-09 (×4): 3.375 g via INTRAVENOUS
  Filled 2018-11-07 (×4): qty 50

## 2018-11-07 MED ORDER — METOPROLOL TARTRATE 50 MG PO TABS: 100.0000 mg | ORAL_TABLET | Freq: Two times a day (BID) | ORAL | Status: DC

## 2018-11-07 MED ORDER — SODIUM CHLORIDE 0.9 % IV BOLUS
250.0000 mL | Freq: Once | INTRAVENOUS | Status: AC
  Administered 2018-11-07: 250 mL via INTRAVENOUS

## 2018-11-07 MED ORDER — AMIODARONE HCL IN DEXTROSE 360-4.14 MG/200ML-% IV SOLN
60.0000 mg/h | INTRAVENOUS | Status: AC
  Administered 2018-11-07: 60 mg/h via INTRAVENOUS
  Filled 2018-11-07: qty 200

## 2018-11-07 MED ORDER — AMIODARONE HCL IN DEXTROSE 360-4.14 MG/200ML-% IV SOLN
60.0000 mg/h | INTRAVENOUS | Status: DC
  Administered 2018-11-07: 30 mg/h via INTRAVENOUS
  Administered 2018-11-08: 60 mg/h via INTRAVENOUS
  Administered 2018-11-08: 30 mg/h via INTRAVENOUS
  Administered 2018-11-09: 60 mg/h via INTRAVENOUS
  Administered 2018-11-09: 30 mg/h via INTRAVENOUS
  Filled 2018-11-07 (×8): qty 200

## 2018-11-07 MED ORDER — AMLODIPINE BESYLATE 5 MG PO TABS: 2.5000 mg | ORAL_TABLET | Freq: Every day | ORAL | Status: DC

## 2018-11-07 NOTE — Progress Notes (Signed)
Due to multiple IV medications that are incompatible with one another, will adjust Zosyn to 3.375g IV q6h (each dose over 30 minutes) through 4am dose tomorrow, then adjust back to Zosyn 3.375g IV q8h (each dose infused over 4 hours).   Lindell Spar, PharmD, BCPS Clinical Pharmacist  11/07/2018 4:53 PM

## 2018-11-07 NOTE — Progress Notes (Signed)
Provided support with family around [facility] restrictions    Chaplain received two voicemails from [employer] ([phone] and [phone]) on behalf of pt's son Antonio Manning.  Stating they wished to help Antonio Manning be able to see his father.  Chaplain spoke with patient at bedside, who did not know this person.  Pt did not wish to call his son, Antonio Manning, but rather wished chaplain to call son Antonio Manning and speak with Antonio Manning about the matter.    Chaplain called Antonio Manning, provided support via phone.  Antonio Manning wishes for continued chaplain support for his father and is appreciative of night chaplain, Antonio Manning, speaking with Antonio Manning at bedside and spouse, Antonio Manning, via phone.  Antonio Manning will contact chaplain when his sister can go to Antonio. Antonio Manning house with smart phone and arrange a video conference with this pt and spouse.  Antonio Manning wished chaplain to contact Antonio Manning.    Chaplain contacted Antonio Manning, who wished to be able to come see his father.  He feels his father is end of life.  Chaplain provided education and support around current visitation policies due to Antonio Manning.  Antonio Manning is clergy with Antonio Manning and has clergy badge.  This allowed Antonio Manning to see his father in emergency department when he arrived.  Chaplain informed Antonio Manning that community clergy were not allowed in hospital at this time and assured that spiritual care would do everything we could to support and advocate for his father and create connection with Antonio Manning and pt spouse, Antonio Manning.   Encouraged Antonio Manning to reach out to care team to assess father's situation and determine if family visitation was appropriate.

## 2018-11-07 NOTE — Progress Notes (Signed)
Location: [place] of visit: emotional support  Visit: Monday 10:00pm-Tuesday 12:44am Summary of pastoral inventions- Chaplain provided emotional support via prayer Chaplain also spoke to wife who was deeply concerned about the Pt's emotional state. Chaplain addressed this issue by offering and leading prayer with wife via phone.   Dante Gang, Chaplain

## 2018-11-07 NOTE — Progress Notes (Signed)
PROGRESS NOTE  Antonio Manning GHW:[id number] DOB: 1940/07/21 DOA: 11/02/2018 PCP: Hulan Fess, MD  HPI/Recap of past 74 hours: 78 year old male with PMH of stage III hepatocellular carcinoma currently on immunotherapy and Avastin, HTN, HLD, hard of hearing, cryptogenic stroke, paroxysmal atrial fibrillation on Eliquis, presented to the ED on 11/02/2018 due to weakness, dyspnea and coffee-ground emesis.  In the ED, patient met sepsis criteria including hypothermia, elevated lactate, tachypnea and tachycardia, initiated on broad-spectrum antibiotics.  He also had acute blood loss anemia/hemoglobin 6.6 due to acute upper GI bleed.  Admitted to stepdown unit for sepsis, acute respiratory failure with hypoxia, elevated lactate, acute upper GI bleed with ABLA and A. fib with RVR.  [facility], cardiology and medical oncology consulted.  Status post EGD 7/3.  Hospital course complicated by acute confusion overnight 7/3, possibly related to medications (Remeron and tramadol), resolved.  On 7/6, weak, dyspneic, hypoxic, suspect pulmonary edema.  11/07/18: Patient seen and examined at his bedside.  Conversational dyspnea noted.  Hypotensive this morning with map of 56, gave 250 cc IV fluid bolus and started on IV albumin 50 g x 4 doses to maintain map greater than 65.  A. fib with RVR twelve-lead EKG rate 120.  He denies chest pain.   Assessment/Plan: Principal Problem:   Sepsis ([place]) Active Problems:   S/P left hip replacement   Fatigue   Hyperlipidemia   History of CVA (cerebrovascular accident)   PAF (paroxysmal atrial fibrillation) (HCC)   Liver lesion   Hepatocellular carcinoma (HCC)   Lactic acidosis   High anion gap metabolic acidosis   Symptomatic anemia   Coffee ground emesis   Hypothyroidism   Atrial fibrillation with RVR (HCC)   Abdominal pain   Left hip pain  Sepsis of unclear etiology Suspected aspiration pneumonia Currently on Augmentin.  Completed Unasyn. Blood cultures x2- to date  Procalcitonin on 11/03/2018 was 1.36 Repeat procalcitonin tomorrow 11/08/2018  Hypotension in the setting of A. fib with RVR Cardiology has been consulted and following Beta-blocker held due to hypotension, defer to cardiology to restart rate control agent Maintain map greater than 65 Given 250 cc IV fluid bolus normal saline Start 50 g albumin x4 doses Closely monitor vital signs  A. fib with RVR Heart rate 120-130s Not on rate control medications at this time due to hypotension Defer to cardiology IV Lopressor as needed for heart rate greater than 130 w MAP greater than 70.  Acute on chronic combined systolic and diastolic CHF 2D echo done on 11/03/2018 showed LVEF 45 to 78% with 1 diastolic dysfunction Continue strict I's and O's and daily weight Beta-blocker on hold due to hypotension Cardiology following  Pulmonary edema likely cardiogenic Maintain O2 saturation greater than 92% Independently reviewed last chest x-ray which showed cardiomegaly with mild increase in pulmonary vascularity and left pleural effusion.  Elevated transaminases LFTs are trending down Stable diffuse hepatic lesions are noted consistent with metastatic disease or malignancy. Stable 16 mm aortocaval lymph node. Moderate ascites.  Acute upper GI bleed/severe erosive esophagitis and mild gastritis  He was treated supportively with bowel rest/n.p.o., IV Protonix drip, IV octreotide drip.  Interestingly FOBT was negative.  [facility] was consulted and underwent EGD 7/3 with findings as noted below.  No overt GI bleed.  Tolerating regular consistency diet, twice daily PPI indefinitely and sucralfate for 2 weeks.  Outpatient follow-up with GI with biopsies.  Acute blood loss anemia/symptomatic  Secondary to acute upper GI bleed.  S/p 2 units PRBC  and hemoglobin has improved from 6.6 on admission to 8.1 range.  Transfuse as needed for hemoglobin 7 or less.  Eliquis which was held due to GI bleed resumed  on 7/5.  Hemoglobin up to 8.1 today.  Continue to monitor serial CBCs.  Resolved lactic acidosis  Secondary to sepsis, acute upper GI bleed, acute blood loss anemia  Treated as above and resolved.  Resolved high anion gap metabolic acidosis  Anion gap 33 on admission.  Resolved.  Hypothyroidism  TSH 29.022, free T4: 0.97.  Free T3: 1.1.  Currently on Synthroid 50 mcg daily.  ?  Abnormal TSH related to acute illness/cancer.  Clinically appears euthyroid.  Continue current dose of Synthroid, defer to PCP regarding adjusting dose and consider repeating TSH in 4 weeks.  Also hesitant to increase his Synthroid dose given issues with sinus tachycardia/A. fib RVR.  Cardiomyopathy  TTE 7/3: LVEF 45-50%.  No chest pain reported.  Management per cardiology.  Elevated troponin  Likely due to demand ischemia from acute illness discussed above rather than due to ACS.  Denies chest pain  Stage III hepatocellular carcinoma  Oncology input appreciated.  Currently on atezolizumab (immunotherapy) and Avastin, last dose 6/24.  LFTs have worsened 2 days ago but continued to improve.?  Reactive  History of CVA  Eliquis resumed.  Hyperlipidemia  Continue atorvastatin.  Abdominal pain  Present on admission.  Likely related to esophagitis now seen on EGD.  Stable hepatic metastatic lesions and moderate ascites.  Abdominal pain may be related to his [place].  Left hip pain  X-ray without fractures or acute findings.  Hypomagnesemia  Replaced.  Hypokalemia  Replaced.    Acute toxic metabolic encephalopathy  Overnight 7/3 patient had confusion, agitation and had to be restrained.  Possibly due to medications i.e. Remeron which she was not taking at home and tramadol, discontinued.  Mental status appears to be back to baseline.  Avoid physical restraints.  If he gets agitated again, try to get a Air cabin crew.  Also to call patient's youngest son who can face time  the patient and try to reorient him and calm him down.  Ascending aortic aneurysm Ascending aorta measuring 44 mm on echo 7/3, chest CT 4/12: 48 mm.  Recommend outpatient follow-up.   DVT prophylaxis: SCDs, Eliquis Code Status: Full Family Communication: Discussed in detail with patient's youngest son on 7/6, updated care and answered questions.  He indicates that patient and family will not want SNF and to not bring this topic up with the patient, the plan will be for patient to return home and the family will make arrangements to manage his care at home. Disposition:  Not medically stable for discharge due to hypotension and A. fib with RVR. Consultants:  [facility] Medical Oncology  Procedures:  EGD 3/7  Antimicrobials:  IV cefepime and vancomycin 7/2-7/3 IV Unasyn 7/3 > 7/5 Augmentin 7/6 >    Objective: Vitals:   11/07/18 0606 11/07/18 0901 11/07/18 0902 11/07/18 0906  BP:  (!) 64/51 (!) 68/50 (!) 83/62  Pulse:  (!) 158    Resp:      Temp:      TempSrc:      SpO2:      Weight: 68.5 kg     Height:        Intake/Output Summary (Last 24 hours) at 11/07/2018 0911 Last data filed at 11/07/2018 0359 Gross per 24 hour  Intake 470 ml  Output 1701 ml  Net -1231 ml  Filed Weights   11/04/18 0500 11/05/18 0500 11/07/18 0606  Weight: 73.3 kg 75.6 kg 68.5 kg    Exam:  . General: 78 y.o. year-old male well developed well nourished in no acute distress.  Alert and interactive. . Cardiovascular: Regular rate and rhythm with no rubs or gallops.  No thyromegaly or JVD noted.   Marland Kitchen Respiratory: Mild rales at bases with no wheezes noted.  Poor inspiratory effort. . Abdomen: Soft nontender nondistended with normal bowel sounds x4 quadrants. . Musculoskeletal: Trace lower extremity edema. 2/4 pulses in all 4 extremities. Marland Kitchen Psychiatry: Mood is appropriate for condition and setting.   Data Reviewed: CBC: Recent Labs  Lab 11/02/18 1614  11/04/18 0709 11/04/18  1652 11/05/18 0544 11/06/18 0520 11/07/18 0504  WBC 10.1   < > 5.3 6.0 5.9 7.9 9.7  NEUTROABS 8.4*  --   --   --   --   --   --   HGB 7.4*   < > 7.3* 7.3* 7.4* 8.1* 9.0*  HCT 23.7*   < > 22.6* 22.7* 23.1* 25.8* 28.0*  MCV 100.0   < > 95.8 96.6 97.5 98.1 95.9  PLT 249   < > 155 160 165 198 247   < > = values in this interval not displayed.   Basic Metabolic Panel: Recent Labs  Lab 11/02/18 0827 11/02/18 1527 11/03/18 0816 11/04/18 0709 11/05/18 0544 11/06/18 0520 11/07/18 0504  NA 140 141 138 138 142 141 139  K 3.6 4.1 3.6 2.7* 3.9 3.6 3.0*  CL 96* 102 103 101 107 106 105  CO2 11* 21* 24 25 26 26 24   GLUCOSE 143* 120* 90 79 87 95 105*  BUN 23 23 20 19 22 21 22   CREATININE 1.14 0.88 0.54* 0.52* 0.54* 0.43* 0.44*  CALCIUM 9.3 8.6* 8.0* 7.9* 8.3* 8.4* 8.3*  MG 2.0 1.5*  --  2.1  --   --   --    GFR: Estimated Creatinine Clearance: 71.4 mL/min (A) (by C-G formula based on SCr of 0.44 mg/dL (L)). Liver Function Tests: Recent Labs  Lab 11/02/18 1527 11/04/18 0709 11/05/18 0544 11/06/18 0520 11/07/18 0504  AST 239* 679* 291* 126* 68*  ALT 82* 139* 89* 58* 41  ALKPHOS 131* 717* 572* 434* 350*  BILITOT 1.2 1.6* 1.7* 1.6* 1.9*  PROT 6.3* 5.7* 5.9* 5.6* 5.7*  ALBUMIN 4.1 3.5 3.3* 3.0* 3.0*   Recent Labs  Lab 11/02/18 0827  LIPASE 29   Recent Labs  Lab 11/02/18 0914 11/04/18 1652  AMMONIA 41* <9*   Coagulation Profile: Recent Labs  Lab 11/02/18 0830  INR 1.7*   Cardiac Enzymes: Recent Labs  Lab 11/02/18 0827  CKTOTAL 24*   BNP (last 3 results) No results for input(s): PROBNP in the last 8760 hours. HbA1C: No results for input(s): HGBA1C in the last 72 hours. CBG: Recent Labs  Lab 11/02/18 0828 11/03/18 0739  GLUCAP 119* 86   Lipid Profile: No results for input(s): CHOL, HDL, LDLCALC, TRIG, CHOLHDL, LDLDIRECT in the last 72 hours. Thyroid Function Tests: No results for input(s): TSH, T4TOTAL, FREET4, T3FREE, THYROIDAB in the last 72 hours.  Anemia Panel: No results for input(s): VITAMINB12, FOLATE, FERRITIN, TIBC, IRON, RETICCTPCT in the last 72 hours. Urine analysis:    Component Value Date/Time   COLORURINE YELLOW 11/02/2018 1200   APPEARANCEUR HAZY (A) 11/02/2018 1200   LABSPEC 1.014 11/02/2018 1200   PHURINE 5.0 11/02/2018 1200   GLUCOSEU 50 (A) 11/02/2018 1200   HGBUR NEGATIVE  11/02/2018 1200   BILIRUBINUR NEGATIVE 11/02/2018 1200   KETONESUR 20 (A) 11/02/2018 1200   PROTEINUR 100 (A) 11/02/2018 1200   UROBILINOGEN 0.2 05/13/2011 0930   NITRITE NEGATIVE 11/02/2018 1200   LEUKOCYTESUR NEGATIVE 11/02/2018 1200   Sepsis Labs: '@LABRCNTIP'$ (procalcitonin:4,lacticidven:4)  ) Recent Results (from the past 240 hour(s))  SARS Coronavirus 2 (CEPHEID- Performed in [facility] hospital lab), Hosp Order     Status: None   Collection Time: 11/02/18  8:27 AM   Specimen: Nasopharyngeal Swab  Result Value Ref Range Status   SARS Coronavirus 2 NEGATIVE NEGATIVE Final    Comment: (NOTE) If result is NEGATIVE SARS-CoV-2 target nucleic acids are NOT DETECTED. The SARS-CoV-2 RNA is generally detectable in upper and lower  respiratory specimens during the acute phase of infection. The lowest  concentration of SARS-CoV-2 viral copies this assay can detect is 250  copies / mL. A negative result does not preclude SARS-CoV-2 infection  and should not be used as the sole basis for treatment or other  patient management decisions.  A negative result may occur with  improper specimen collection / handling, submission of specimen other  than nasopharyngeal swab, presence of viral mutation(s) within the  areas targeted by this assay, and inadequate number of viral copies  (<250 copies / mL). A negative result must be combined with clinical  observations, patient history, and epidemiological information. If result is POSITIVE SARS-CoV-2 target nucleic acids are DETECTED. The SARS-CoV-2 RNA is generally detectable in upper and lower   respiratory specimens dur ing the acute phase of infection.  Positive  results are indicative of active infection with SARS-CoV-2.  Clinical  correlation with patient history and other diagnostic information is  necessary to determine patient infection status.  Positive results do  not rule out bacterial infection or co-infection with other viruses. If result is PRESUMPTIVE POSTIVE SARS-CoV-2 nucleic acids MAY BE PRESENT.   A presumptive positive result was obtained on the submitted specimen  and confirmed on repeat testing.  While 2019 novel coronavirus  (SARS-CoV-2) nucleic acids may be present in the submitted sample  additional confirmatory testing may be necessary for epidemiological  and / or clinical management purposes  to differentiate between  SARS-CoV-2 and other Sarbecovirus currently known to infect humans.  If clinically indicated additional testing with an alternate test  methodology [phone]) is advised. The SARS-CoV-2 RNA is generally  detectable in upper and lower respiratory sp ecimens during the acute  phase of infection. The expected result is Negative. Fact Sheet for Patients:  StrictlyIdeas.no Fact Sheet for Healthcare Providers: BankingDealers.co.za This test is not yet approved or cleared by the [place] FDA and has been authorized for detection and/or diagnosis of SARS-CoV-2 by FDA under an Emergency Use Authorization (EUA).  This EUA will remain in effect (meaning this test can be used) for the duration of the COVID-19 declaration under Section 564(b)(1) of the Act, 21 U.S.C. section 360bbb-3(b)(1), unless the authorization is terminated or revoked sooner. Performed at [facility], [place] [address], [place], [place] [address]   Blood culture (routine x 2)     Status: None (Preliminary result)   Collection Time: 11/02/18  8:30 AM   Specimen: BLOOD  Result Value Ref Range Status    Specimen Description   Final    BLOOD BLOOD RIGHT FOREARM Performed at [facility] [address], [place], [place] [address]    Special Requests   Final    BOTTLES DRAWN AEROBIC AND  ANAEROBIC Blood Culture adequate volume Performed at [facility] [address], [place], [place] [address]    Culture   Final    NO GROWTH 4 DAYS Performed at [facility], [place] [address], [place], [place] [address]    Report Status PENDING  Incomplete  Urine culture     Status: None   Collection Time: 11/02/18 12:00 PM   Specimen: Urine, Clean Catch  Result Value Ref Range Status   Specimen Description   Final    URINE, CLEAN CATCH Performed at [facility], [place] [address] [place], [place] [address]    Special Requests   Final    NONE Performed at [name], [place] [address], [place], [place] [address]    Culture   Final    NO GROWTH Performed at [facility], [place] [address], [place], [place] [address]    Report Status 11/03/2018 FINAL  Final  MRSA PCR Screening     Status: None   Collection Time: 11/02/18 12:05 PM   Specimen: Nasopharyngeal  Result Value Ref Range Status   MRSA by PCR NEGATIVE NEGATIVE Final    Comment:        The GeneXpert MRSA Assay (FDA approved for NASAL specimens only), is one component of a comprehensive MRSA colonization surveillance program. It is not intended to diagnose MRSA infection nor to guide or monitor treatment for MRSA infections. Performed at [facility], [place] [address], [place], [place] [address]   Blood culture (routine x 2)     Status: None (Preliminary result)   Collection Time: 11/02/18  3:27 PM   Specimen: BLOOD  Result Value Ref Range Status   Specimen Description   Final    BLOOD LEFT ARM Performed at [facility] [address], [place], [place] [address]    Special Requests   Final    BOTTLES DRAWN  AEROBIC ONLY Blood Culture adequate volume Performed at [facility] [address], [place], [place] [address]    Culture   Final    NO GROWTH 4 DAYS Performed at [facility], [place] [address], [place],  [address]    Report Status PENDING  Incomplete      Studies: Dg Chest Port 1 View  Result Date: 11/06/2018 CLINICAL DATA:  Shortness of breath EXAM: PORTABLE CHEST 1 VIEW COMPARISON:  11/03/2018 FINDINGS: Cardiomegaly. Aortic atherosclerosis. Enlargement of bilateral effusions with lower lobe atelectasis. Loop recorder in place. Upper lungs are clear. IMPRESSION: Worsened congestive heart failure with enlarging effusions and lower lobe atelectasis. Electronically Signed   By: Nelson Chimes M.D.   On: 11/06/2018 12:38    Scheduled Meds: . amLODipine  2.5 mg Oral Daily  . amoxicillin-clavulanate  1 tablet Oral Q12H  . apixaban  5 mg Oral BID  . feeding supplement (ENSURE ENLIVE)  237 mL Oral TID BM  . levothyroxine  50 mcg Oral QAC breakfast  . mouth rinse  15 mL Mouth Rinse BID  . metoprolol tartrate  100 mg Oral BID  . multivitamin with minerals  1 tablet Oral Daily  . pantoprazole  40 mg Oral BID  . sucralfate  1 g Oral TID WC & HS  . vitamin C  250 mg Oral Daily    Continuous Infusions: . albumin human    . sodium chloride       LOS: 5 days     Kayleen Memos,  MD [facility] Pager [phone]  If 7PM-7AM, please contact night-coverage [email or web address] Password [email or web address] 11/07/2018, 9:11 AM

## 2018-11-07 NOTE — Progress Notes (Signed)
Updated wife and son Quita Skye via phone [phone]. All questions answered to their satisfaction.

## 2018-11-07 NOTE — Telephone Encounter (Signed)
Received call from pt's son, Quita Skye asking if Dr Burr Medico could go by & see his dad.  He reports that pt is very down & weak & they are concerned about him.  Informed that Dr Burr Medico is out of office today but will route a message to her.  He was very Patent attorney.

## 2018-11-07 NOTE — Progress Notes (Signed)
PHARMACY NOTE -  [facility] has been assisting with dosing of Zosyn for aspiration PNA.  Dosage remains stable at 3.375g IV q8 hr and need for further dosage adjustment appears unlikely at present given good renal function  Pharmacy will sign off, following peripherally for culture results or dose adjustments. Please reconsult if a change in clinical status warrants re-evaluation of dosage.  Reuel Boom, PharmD, BCPS [phone] 11/07/2018, 2:54 PM

## 2018-11-07 NOTE — Progress Notes (Addendum)
Progress Note  Patient Name: Antonio Manning Date of Encounter: 11/07/2018  Primary Cardiologist: Sanda Klein, MD   Subjective   Pt in afib with RVR, rates in 130's as of 0720 this am. He currently feels weak and lightheaded. Has been having some mild lower left chest pressure. EKG without ischemic changes. BP low, nurse starting IV NS bolus.   Inpatient Medications    Scheduled Meds: . amLODipine  2.5 mg Oral Daily  . amoxicillin-clavulanate  1 tablet Oral Q12H  . apixaban  5 mg Oral BID  . feeding supplement (ENSURE ENLIVE)  237 mL Oral TID BM  . levothyroxine  50 mcg Oral QAC breakfast  . mouth rinse  15 mL Mouth Rinse BID  . metoprolol tartrate  100 mg Oral BID  . multivitamin with minerals  1 tablet Oral Daily  . pantoprazole  40 mg Oral BID  . sucralfate  1 g Oral TID WC & HS  . vitamin C  250 mg Oral Daily   Continuous Infusions:  PRN Meds: acetaminophen **OR** acetaminophen, albuterol, ondansetron **OR** ondansetron (ZOFRAN) IV   Vital Signs    Vitals:   11/06/18 1318 11/06/18 2200 11/07/18 0356 11/07/18 0606  BP: (!) 130/91 (!) 115/91 (!) 139/92   Pulse: 71 91 (!) 52   Resp: 16 14 14    Temp: 98 F (36.7 C) 97.6 F (36.4 C) (!) 97.5 F (36.4 C)   TempSrc: Oral Oral Oral   SpO2: 93% 94% 96%   Weight:    68.5 kg  Height:        Intake/Output Summary (Last 24 hours) at 11/07/2018 0852 Last data filed at 11/07/2018 0359 Gross per 24 hour  Intake 470 ml  Output 1701 ml  Net -1231 ml   Last 3 Weights 11/07/2018 11/05/2018 11/04/2018  Weight (lbs) 151 lb 0.2 oz 166 lb 10.7 oz 161 lb 9.6 oz  Weight (kg) 68.5 kg 75.6 kg 73.3 kg      Telemetry    Sinus rhythm in the 60's with PACs overnight, converted to afib RVR at ~0720, rates in 130's - Personally Reviewed  ECG    Atrial fibrillation with rapid ventricular response, 128 bpm, LAD, low voltage QRS  - Personally Reviewed  Physical Exam   GEN: No acute distress.   Neck: No JVD Cardiac: Irregular rhythm,  tachycardia, no murmurs, rubs, or gallops.  Respiratory: Clear to auscultation bilaterally. GI: Soft, nontender, non-distended  MS: No edema; No deformity. Neuro:  Nonfocal  Psych: Normal affect   Labs    High Sensitivity Troponin:   Recent Labs  Lab 11/02/18 1020 11/02/18 1527 11/02/18 1805  TROPONINIHS 10.0 25.0* 36.0*      Cardiac EnzymesNo results for input(s): TROPONINI in the last 168 hours. No results for input(s): TROPIPOC in the last 168 hours.   Chemistry Recent Labs  Lab 11/05/18 0544 11/06/18 0520 11/07/18 0504  NA 142 141 139  K 3.9 3.6 3.0*  CL 107 106 105  CO2 26 26 24   GLUCOSE 87 95 105*  BUN 22 21 22   CREATININE 0.54* 0.43* 0.44*  CALCIUM 8.3* 8.4* 8.3*  PROT 5.9* 5.6* 5.7*  ALBUMIN 3.3* 3.0* 3.0*  AST 291* 126* 68*  ALT 89* 58* 41  ALKPHOS 572* 434* 350*  BILITOT 1.7* 1.6* 1.9*  GFRNONAA >60 >60 >60  GFRAA >60 >60 >60  ANIONGAP 9 9 10      Hematology Recent Labs  Lab 11/05/18 0544 11/06/18 0520 11/07/18 0504  WBC 5.9  7.9 9.7  RBC 2.37* 2.63* 2.92*  HGB 7.4* 8.1* 9.0*  HCT 23.1* 25.8* 28.0*  MCV 97.5 98.1 95.9  MCH 31.2 30.8 30.8  MCHC 32.0 31.4 32.1  RDW 17.3* 17.2* 17.0*  PLT 165 198 247    BNPNo results for input(s): BNP, PROBNP in the last 168 hours.   DDimer No results for input(s): DDIMER in the last 168 hours.   Radiology    Dg Chest Port 1 View  Result Date: 11/06/2018 CLINICAL DATA:  Shortness of breath EXAM: PORTABLE CHEST 1 VIEW COMPARISON:  11/03/2018 FINDINGS: Cardiomegaly. Aortic atherosclerosis. Enlargement of bilateral effusions with lower lobe atelectasis. Loop recorder in place. Upper lungs are clear. IMPRESSION: Worsened congestive heart failure with enlarging effusions and lower lobe atelectasis. Electronically Signed   By: Nelson Chimes M.D.   On: 11/06/2018 12:38    Cardiac Studies   Echocardiogram 11/03/2018 IMPRESSIONS  1. The left ventricle has mildly reduced systolic function, with an ejection  fraction of 45-50%. The cavity size was normal. There is mildly increased left ventricular wall thickness. Left ventricular diastolic Doppler parameters are consistent with  impaired relaxation. Indeterminate filling pressures The E/e' is 8-15. There is abnormal septal motion consistent with left bundle branch block. 2. The right ventricle has normal systolic function. The cavity was normal. There is no increase in right ventricular wall thickness. 3. The mitral valve is abnormal. Mild thickening of the mitral valve leaflet. Mitral valve regurgitation is mild to moderate by color flow Doppler. 4. The tricuspid valve is grossly normal. 5. The aortic valve is tricuspid. Mild sclerosis of the aortic valve. Aortic valve regurgitation is trivial by color flow Doppler. No stenosis of the aortic valve. 6. There is mild to moderate dilatation of the ascending aorta measuring 44 mm. 7. The average left ventricular global longitudinal strain is -12.0 %.  SUMMARY  LVEF 45-50%, mild LVH, global hypokinesis and incoordinate septal motion, grade 1 DD, indeterminate LV filling pressure, normal biatrial size, mild to moderate MR, trivial AI, trivial TR, IVC not visualized, no obvious atrial level shunt, abnormal GLS at -12% with predominant septal strain abnormality FINDINGS Left Ventricle: The left ventricle has mildly reduced systolic function, with an ejection fraction of 45-50%. The cavity size was normal. There is mildly increased left ventricular wall thickness. Left ventricular diastolic Doppler parameters are  consistent with impaired relaxation. Indeterminate filling pressures The E/e' is 8-15. There is abnormal (paradoxical) septal motion, consistent with left bundle branch block. The average left ventricular global longitudinal strain is -12.0 %.  Right Ventricle: The right ventricle has normal systolic function. The cavity was normal. There is no increase in right ventricular wall  thickness. Left Atrium: Left atrial size was normal in size. Right Atrium: Right atrial size was normal in size. Right atrial pressure is estimated at 8 mmHg. Interatrial Septum: No atrial level shunt detected by color flow Doppler. Pericardium: There is no evidence of pericardial effusion. Mitral Valve: The mitral valve is abnormal. Mild thickening of the mitral valve leaflet. Mitral valve regurgitation is mild to moderate by color flow Doppler. Tricuspid Valve: The tricuspid valve is grossly normal. Tricuspid valve regurgitation is trivial by color flow Doppler. Aortic Valve: The aortic valve is tricuspid Mild sclerosis of the aortic valve. Aortic valve regurgitation is trivial by color flow Doppler. There is No stenosis of the aortic valve. Pulmonic Valve: The pulmonic valve was grossly normal. Pulmonic valve regurgitation is not visualized by color flow Doppler. Aorta: There is mild to moderate  dilatation of the ascending aorta measuring 44 mm. Venous: The inferior vena cava was not well visualized.  Echo 12/27/17: Study Conclusions  - Left ventricle: The cavity size was normal. Systolic function was normal. The estimated ejection fraction was in the range of 55% to 60%. Wall motion was normal; there were no regional wall motion abnormalities. Doppler parameters are consistent with abnormal left ventricular relaxation (grade 1 diastolic dysfunction). Doppler parameters are consistent with both elevated ventricular end-diastolic filling pressure and elevated left atrial filling pressure. - Aortic valve: There was trivial regurgitation. - Mitral valve: Mild, late systolicprolapse, involving the posterior leaflet. There was mild regurgitation.   ETT 12/27/17:  Blood pressure demonstrated a normal response to exercise.  There was no ST segment deviation noted during stress.  ETT with normal exercise tolerance (6:10); no chest pain; normal BP response; no ST  changes; negative adequate ETT; Duke treadmill score 6.   Patient Profile     78 y.o. male with a hx of cryptogenic stroke and subsequent diagnosis of atrial fibrillation with low burden by ILR (Dr. Sallyanne Kuster), ETT negative for ischemia (2019), and chronic diastolic heart failurewho is being seen for the evaluation of possible Afib RVR in the settings of acute GIB and pneumonia.   Assessment & Plan    A. fib RVR -Echo shows mildly reduced LV systolic function with EF 45-50%, down from 55-60% in 12/2017. Also abnormal septal motion consistent with left bundle branch block. Reduced EF may be tachycardia mediated.  -Patient has a history of PAF, on anticoagulation with apixaban -Metoprolol increased to 100 mg twice daily yesterday to try to reduce ectopy and pt tolerated well overnight with HR in 60's. This am converted to Afib with RVR in the 130's. BP low. Now receiving a NS bolus.  -Mild left lower chest pressure. EKG without ischemic changes.  -Will see how he responds to IV fluids. Would like to avoid amiodarone due to abnormal LFTs.   Hypokalemia -K+ 3.0. Will replace.   Hypertension -Amlodipine had been added back and blood pressure was stable until this am. Hypotension with afib RVR. Currently  Holding amlodipine and metoprolol. IV bolus ordered. IV albumin ordered by primary team.   Hypothyroidism -TSH elevated at 29. Free T4 normal. T3 low at 1.1. Per primary, possible abnormal TSH related to acute illness/cancer. Deferring to PCP for any adjustment in medication.   Acute upper GI bleed -S/P transfusion of 2 Units PRBCs. Hgb 6.6 >>9, now stable.  -Severe esophagitis and mild gastritis on EGD. Placed on carafate for 2 weeks. Continued on PPI. -LFTs improving  -For outpatient GI follow up.   Ascending aortic aneurysm -ascending aorta measuring 44 mm on echo 11/03/18. -Chest CT 08/13/18, 48 mm. Recommendation for semi annual imaging. Follow up with Dr. Sallyanne Kuster as outpatient.     For questions or updates, please contact [place] Please consult [email or web address] for contact info under     Signed, Daune Perch, NP  11/07/2018, 8:52 AM    History and all data above reviewed.  Patient examined.  I agree with the findings as above.  The patient looks weaker than yesterday.   The objective finding is hypotension and atrial fib with rapid rate.  He denies pain but has a subjective sensation of dyspnea.  He has had sats that are OK on 2 liters.   The patient exam reveals VPX:[id number]  ,  Lungs: Decreased breath sounds at the left greater than right base  ,  Abd: Positive bowel sounds,  no rebound no guarding, Ext Decreased DP/PT and feet cool to touch  .  All available labs, radiology testing, previous records reviewed. Agree with documented assessment and plan. HYPOTENSION:  I don't think that the afib RVR is necessarily driving the hypotension but that the hypotension is driving the atrial fib. I would be very careful about fluid resuscitation with his reduced EF and mild volume overload evident on CXR two days ago.  I reviewed the oncology records and the "treatment goal was palliative".  He is a full code.  I am worried that he is evolving into shock and will require support including pressor support.  Goals of therapy and level of support should be clarified with the patient and family.   I will try amiodarone without bolus and have removed beta blockers and Norvasc.  Discussed with the primary team.   Minus Breeding  10:55 AM  11/07/2018

## 2018-11-07 NOTE — Progress Notes (Signed)
Pathology shows benign gastritis negative for H. pylori and marked esophagitis with mostly just necrotic debris. Would continue patient on current treatment with pantoprazole and sucralfate slurry and call us back if needed.

## 2018-11-07 NOTE — Progress Notes (Signed)
PT Cancellation Note  Patient Details Name: KYLIL SWOPES MRN: [id number] DOB: 1941-02-27   Cancelled Treatment:    Reason Eval/Treat Not Completed: Medical issues which prohibited therapy; RN reports pt with low BP and getting Albumin infusion.  Will cancel and check back another day.   Reginia Naas 11/07/2018, 12:40 PM  Magda Kiel, [place] [phone] 11/07/2018

## 2018-11-07 NOTE — Plan of Care (Signed)
  Problem: Clinical Measurements: Goal: Will remain free from infection Outcome: Progressing   Problem: Education: Goal: Knowledge of General Education information will improve Description: Including pain rating scale, medication(s)/side effects and non-pharmacologic comfort measures Outcome: Progressing   Problem: Health Behavior/Discharge Planning: Goal: Ability to manage health-related needs will improve Outcome: Progressing

## 2018-11-07 NOTE — Evaluation (Signed)
Clinical/Bedside Swallow Evaluation Patient Details  Name: Antonio Manning MRN: [id number] Date of Birth: 19-May-1940  Today's Date: 11/07/2018 Time: SLP Start Time (ACUTE ONLY): 1535 SLP Stop Time (ACUTE ONLY): 1555 SLP Time Calculation (min) (ACUTE ONLY): 20 min  Past Medical History:  Past Medical History:  Diagnosis Date  . Arthritis 05/19/2011   osteoarthritis,lt. hip, and neck area  . Cancer ([place]) May 19, 2011   skin cancer lesions-multiple  . Cataract May 19, 2011   right  . Cholesterol serum elevated 19-May-2011   tx. Lipitor  . Expressive aphasia 10/2015  . Hearing loss 05-19-11   wears hearing aids bilaterally  . Kidney calculi 05-19-2011   past hx. x1-passed on own, mild prostate issues-no meds  . PONV (postoperative nausea and vomiting) 2011/05/19   with cataract surgery  . Stroke [facility]) 10/2015   Past Surgical History:  Past Surgical History:  Procedure Laterality Date  . BACK SURGERY  19-May-2011   Lumbar disc surgery-no hardware  . BIOPSY  11/03/2018   Procedure: BIOPSY;  Surgeon: Otis Brace, MD;  Location: [facility];  Service: Gastroenterology;;  . CATARACT EXTRACTION W/ INTRAOCULAR LENS IMPLANT  May 19, 2011   left eye  . EP IMPLANTABLE DEVICE N/A 10/29/2015   Procedure: Loop Recorder Insertion;  Surgeon: Evans Lance, MD;  Location: [facility];  Service: Cardiovascular;  Laterality: N/A;  . ESOPHAGOGASTRODUODENOSCOPY (EGD) WITH PROPOFOL N/A 11/03/2018   Procedure: ESOPHAGOGASTRODUODENOSCOPY (EGD) WITH PROPOFOL;  Surgeon: Otis Brace, MD;  Location: [facility];  Service: Gastroenterology;  Laterality: N/A;  . EYE SURGERY     eye lid lift   . I&D EXTREMITY Right 01/17/2016   Procedure: IRRIGATION AND DEBRIDEMENT LEG LACERATION;  Surgeon: Vickey Huger, MD;  Location: [facility];  Service: Orthopedics;  Laterality: Right;  . TEE WITHOUT CARDIOVERSION N/A 10/29/2015   Procedure: TRANSESOPHAGEAL ECHOCARDIOGRAM (TEE);  Surgeon: Larey Dresser, MD;  Location: [facility];  Service:  Cardiovascular;  Laterality: N/A;  . TOTAL HIP ARTHROPLASTY  05/20/2011   Procedure: TOTAL HIP ARTHROPLASTY ANTERIOR APPROACH;  Surgeon: Mauri Pole, MD;  Location: [facility];  Service: Orthopedics;  Laterality: Left;   HPI:  Patient is an 78 y.o. male with PMH: stage III hepatocellular carcinoma currently on immunotherapy and Avastin, HTN, HDL, HOH, crytogenic CVA, afib, who presented to ED on 7/2 with weakness, dyspnea, coffee ground emesis. In ED, he met criteria for sepsis and was found to be with upper GI bleed. EGD performed on 7/3. on 7/6, patient with increased weakness, dyspneic, hypoxic and with suspected pulmonary edema.   Assessment / Plan / Recommendation Clinical Impression  Patient presnts with a mild oropharyngeal dysphagia but without overt s/s of aspiration or penetration. SLP did observe presence of oral thrush on tongue and uvula, which likely indicates it is in his pharynx as well. Patient did not report any discomfort with swallowing but did state that "nothing tastes good". He also stated that any action, such as going to the bathroom, fatigues him. SLP recommending downgrade diet slightly from regular solids to Dys 3 mechanical soft solids and monitor patient's intake. SLP Visit Diagnosis: Dysphagia, unspecified (R13.10)    Aspiration Risk  No limitations    Diet Recommendation Dysphagia 3 (Mech soft);Thin liquid   Liquid Administration via: Cup;Straw Medication Administration: Whole meds with liquid Supervision: Patient able to self feed;Intermittent supervision to cue for compensatory strategies Compensations: Slow rate;Small sips/bites Postural Changes: Seated upright at 90 degrees    Other  Recommendations Oral Care Recommendations: Oral care QID  Follow up Recommendations None      Frequency and Duration min 1 x/week  1 week       Prognosis Prognosis for Safe Diet Advancement: Good      Swallow Study   General Date of Onset: 11/02/18 HPI: Patient is  an 78 y.o. male with PMH: stage III hepatocellular carcinoma currently on immunotherapy and Avastin, HTN, HDL, HOH, crytogenic CVA, afib, who presented to ED on 7/2 with weakness, dyspnea, coffee ground emesis. In ED, he met criteria for sepsis and was found to be with upper GI bleed. EGD performed on 7/3. on 7/6, patient with increased weakness, dyspneic, hypoxic and with suspected pulmonary edema. Type of Study: Bedside Swallow Evaluation Previous Swallow Assessment: N/A Diet Prior to this Study: Thin liquids;Regular Temperature Spikes Noted: No Respiratory Status: Room air History of Recent Intubation: No Behavior/Cognition: Alert;Cooperative;Pleasant mood;Lethargic/Drowsy Oral Cavity Assessment: Other (comment)(thrush on tongue and uvula) Oral Care Completed by SLP: Yes Oral Cavity - Dentition: Dentures, top;Dentures, bottom Vision: Functional for self-feeding Self-Feeding Abilities: Able to feed self Patient Positioning: Upright in bed Volitional Cough: Weak Volitional Swallow: Able to elicit    Oral/Motor/Sensory Function Overall Oral Motor/Sensory Function: Within functional limits   Ice Chips     Thin Liquid Thin Liquid: Within functional limits Presentation: Straw;Self Fed    Nectar Thick     Honey Thick     Puree Puree: Within functional limits Presentation: Self Fed   Solid     Solid: Not tested      Nadara Mode [name] 11/07/2018,5:53 PM   Sonia Baller, MA, [facility] Pager: [phone]

## 2018-11-07 NOTE — Progress Notes (Signed)
This morning shortly after shift change patient called out to front desk saying "I can't breathe." RN immediately went into patient's room to check on him and check his oxygen level which was at 96% on .05L of O2. RN assured patient that his oxygen level was normal. Then the patient wanted to get into the chair, so RN got NT to help patient pivot to chair. Once patient was sitting in the chair he told the RN "I feel like i'm going to pass out." RN checked a set of vital signs which showed patient's blood pressure was 64/51 automatically and telemetry was showing patient's HR 158. RN checked a manual BP to determine if the automatic was correct and manual was 68/50. EKG showed A.Fib with RVR. MD [place] Crane was notified and Cardiology happened to be on the floor to come see patient. RN laid the patient back in the chair and elevated his feet, gave a 250cc bolus per MD and blood pressure came up to 112/94 but heart rate was still fluctuating between 130's-150's. RN then gave Albumin per MD to help maintain blood pressure. Cariology put patient on Amiodarone drip. Patient was put back into bed and VS were checked multiple times throughout the rest of the shift which showed improvement in heart rate and blood pressure.   RN did talk to patient's wife and let her know what happened. Day RN updated nightshift RN on all events that took place today with patient. Patient is currently resting in bed, saying he's "still not feeling great but he's better than this morning."

## 2018-11-08 ENCOUNTER — Encounter: Payer: Self-pay | Admitting: Hematology

## 2018-11-08 LAB — CBC WITH DIFFERENTIAL/PLATELET
Abs Immature Granulocytes: 0.25 10*3/uL — ABNORMAL HIGH (ref 0.00–0.07)
Basophils Absolute: 0 10*3/uL (ref 0.0–0.1)
Basophils Relative: 0 %
Eosinophils Absolute: 0 10*3/uL (ref 0.0–0.5)
Eosinophils Relative: 0 %
HCT: 27.5 % — ABNORMAL LOW (ref 39.0–52.0)
Hemoglobin: 8.7 g/dL — ABNORMAL LOW (ref 13.0–17.0)
Immature Granulocytes: 2 %
Lymphocytes Relative: 7 %
Lymphs Abs: 0.7 10*3/uL (ref 0.7–4.0)
MCH: 31.1 pg (ref 26.0–34.0)
MCHC: 31.6 g/dL (ref 30.0–36.0)
MCV: 98.2 fL (ref 80.0–100.0)
Monocytes Absolute: 1.2 10*3/uL — ABNORMAL HIGH (ref 0.1–1.0)
Monocytes Relative: 11 %
Neutro Abs: 8.2 10*3/uL — ABNORMAL HIGH (ref 1.7–7.7)
Neutrophils Relative %: 80 %
Platelets: 300 10*3/uL (ref 150–400)
RBC: 2.8 MIL/uL — ABNORMAL LOW (ref 4.22–5.81)
RDW: 17.3 % — ABNORMAL HIGH (ref 11.5–15.5)
WBC: 10.3 10*3/uL (ref 4.0–10.5)
nRBC: 5 % — ABNORMAL HIGH (ref 0.0–0.2)

## 2018-11-08 LAB — COMPREHENSIVE METABOLIC PANEL
ALT: 22 U/L (ref 0–44)
AST: 34 U/L (ref 15–41)
Albumin: 3.8 g/dL (ref 3.5–5.0)
Alkaline Phosphatase: 193 U/L — ABNORMAL HIGH (ref 38–126)
Anion gap: 11 (ref 5–15)
BUN: 23 mg/dL (ref 8–23)
CO2: 22 mmol/L (ref 22–32)
Calcium: 8.6 mg/dL — ABNORMAL LOW (ref 8.9–10.3)
Chloride: 106 mmol/L (ref 98–111)
Creatinine, Ser: 0.48 mg/dL — ABNORMAL LOW (ref 0.61–1.24)
GFR calc Af Amer: >60 mL/min (ref >60)
GFR calc non Af Amer: >60 mL/min (ref >60)
Glucose, Bld: 109 mg/dL — ABNORMAL HIGH (ref 70–99)
Potassium: 3.1 mmol/L — ABNORMAL LOW (ref 3.5–5.1)
Sodium: 139 mmol/L (ref 135–145)
Total Bilirubin: 2.5 mg/dL — ABNORMAL HIGH (ref 0.3–1.2)
Total Protein: 5.7 g/dL — ABNORMAL LOW (ref 6.5–8.1)

## 2018-11-08 LAB — LACTIC ACID, PLASMA: Lactic Acid, Venous: 1.7 mmol/L (ref 0.5–1.9)

## 2018-11-08 MED ORDER — METOPROLOL TARTRATE 25 MG PO TABS
12.5000 mg | ORAL_TABLET | Freq: Two times a day (BID) | ORAL | Status: DC
  Administered 2018-11-08 – 2018-11-09 (×4): 12.5 mg via ORAL
  Filled 2018-11-08 (×4): qty 1

## 2018-11-08 MED ORDER — POTASSIUM CHLORIDE CRYS ER 20 MEQ PO TBCR
40.0000 meq | EXTENDED_RELEASE_TABLET | ORAL | Status: AC
  Administered 2018-11-08 (×3): 40 meq via ORAL
  Filled 2018-11-08 (×3): qty 2

## 2018-11-08 MED ORDER — TRAZODONE HCL 50 MG PO TABS
25.0000 mg | ORAL_TABLET | Freq: Once | ORAL | Status: AC
  Administered 2018-11-08: 25 mg via ORAL
  Filled 2018-11-08: qty 1

## 2018-11-08 MED ORDER — POTASSIUM CHLORIDE CRYS ER 20 MEQ PO TBCR: 40.0000 meq | EXTENDED_RELEASE_TABLET | ORAL | Status: DC

## 2018-11-08 NOTE — Progress Notes (Signed)
Antonio Manning   DOB:[birth year]   ZL#:[id number]   TJQ#:[id number]  Oncology follow up  Subjective: Pt was hypotensive yesterday, likely secondary to rapid atrial fibrillation.  His blood pressure was normal this morning.  He is very fatigued, has not been out of bed much, has very low appetite, eats little. He also has dyspnea on mild exertion, afebrile.    Objective:  Vitals:   11/08/18 1229 11/08/18 1412  BP:  117/88  Pulse: (!) 111 80  Resp:  16  Temp:  (!) 97.5 F (36.4 C)  SpO2:  100%    Body mass index is 20.48 kg/m.  Intake/Output Summary (Last 24 hours) at 11/08/2018 1759 Last data filed at 11/08/2018 1600 Gross per 24 hour  Intake 964.79 ml  Output 400 ml  Net 564.79 ml     Sclerae unicteric  Oropharynx clear  No peripheral adenopathy  Lungs clear -- no rales or rhonchi  Heart regular rate and rhythm  Abdomen distended, mild tenderness  Neuro nonfocal    CBG (last 3)  No results for input(s): GLUCAP in the last 72 hours.   Labs:  Urine Studies No results for input(s): UHGB, CRYS in the last 72 hours.  Invalid input(s): UACOL, UAPR, USPG, UPH, UTP, UGL, UKET, UBIL, UNIT, UROB, ULEU, UEPI, UWBC, URBC, [place], [place], [place], [place]  Basic Metabolic Panel: Recent Labs  Lab 11/02/18 0827 11/02/18 1527  11/04/18 0709 11/05/18 0544 11/06/18 0520 11/07/18 0504 11/08/18 0525  NA 140 141   < > 138 142 141 139 139  K 3.6 4.1   < > 2.7* 3.9 3.6 3.0* 3.1*  CL 96* 102   < > 101 107 106 105 106  CO2 11* 21*   < > _0 GLUCOSE 143* 120*   < > 79 87 95 105* 109*  BUN 23 23   < > _1 CREATININE 1.14 0.88   < > 0.52* 0.54* 0.43* 0.44* 0.48*  CALCIUM 9.3 8.6*   < > 7.9* 8.3* 8.4* 8.3* 8.6*  MG 2.0 1.5*  --  2.1  --   --   --   --    < > = values in this interval not displayed.   GFR Estimated Creatinine Clearance: 71.4 mL/min (A) (by C-G formula based on SCr of 0.48 mg/dL (L)). Liver Function Tests: Recent Labs  Lab 11/04/18 0709 11/05/18 0544  11/06/18 0520 11/07/18 0504 11/08/18 0525  AST 679* 291* 126* 68* 34  ALT 139* 89* 58* 41 22  ALKPHOS 717* 572* 434* 350* 193*  BILITOT 1.6* 1.7* 1.6* 1.9* 2.5*  PROT 5.7* 5.9* 5.6* 5.7* 5.7*  ALBUMIN 3.5 3.3* 3.0* 3.0* 3.8   Recent Labs  Lab 11/02/18 0827  LIPASE 29   Recent Labs  Lab 11/02/18 0914 11/04/18 1652  AMMONIA 41* <9*   Coagulation profile Recent Labs  Lab 11/02/18 0830  INR 1.7*    CBC: Recent Labs  Lab 11/02/18 1614  11/04/18 1652 11/05/18 0544 11/06/18 0520 11/07/18 0504 11/08/18 0525  WBC 10.1   < > 6.0 5.9 7.9 9.7 10.3  NEUTROABS 8.4*  --   --   --   --   --  8.2*  HGB 7.4*   < > 7.3* 7.4* 8.1* 9.0* 8.7*  HCT 23.7*   < > 22.7* 23.1* 25.8* 28.0* 27.5*  MCV 100.0   < > 96.6 97.5 98.1 95.9 98.2  PLT 249   < > 160  165 198 247 300   < > = values in this interval not displayed.   Cardiac Enzymes: Recent Labs  Lab 11/02/18 0827  CKTOTAL 24*   BNP: Invalid input(s): POCBNP CBG: Recent Labs  Lab 11/02/18 0828 11/03/18 0739  GLUCAP 119* 86   D-Dimer No results for input(s): DDIMER in the last 72 hours. Hgb A1c No results for input(s): HGBA1C in the last 72 hours. Lipid Profile No results for input(s): CHOL, HDL, LDLCALC, TRIG, CHOLHDL, LDLDIRECT in the last 72 hours. Thyroid function studies No results for input(s): TSH, T4TOTAL, T3FREE, THYROIDAB in the last 72 hours.  Invalid input(s): FREET3 Anemia work up No results for input(s): VITAMINB12, FOLATE, FERRITIN, TIBC, IRON, RETICCTPCT in the last 72 hours. Microbiology Recent Results (from the past 240 hour(s))  SARS Coronavirus 2 (CEPHEID- Performed in [place] hospital lab), Hosp Order     Status: None   Collection Time: 11/02/18  8:27 AM   Specimen: Nasopharyngeal Swab  Result Value Ref Range Status   SARS Coronavirus 2 NEGATIVE NEGATIVE Final    Comment: (NOTE) If result is NEGATIVE SARS-CoV-2 target nucleic acids are NOT DETECTED. The SARS-CoV-2 RNA is generally  detectable in upper and lower  respiratory specimens during the acute phase of infection. The lowest  concentration of SARS-CoV-2 viral copies this assay can detect is 250  copies / mL. A negative result does not preclude SARS-CoV-2 infection  and should not be used as the sole basis for treatment or other  patient management decisions.  A negative result may occur with  improper specimen collection / handling, submission of specimen other  than nasopharyngeal swab, presence of viral mutation(s) within the  areas targeted by this assay, and inadequate number of viral copies  (<250 copies / mL). A negative result must be combined with clinical  observations, patient history, and epidemiological information. If result is POSITIVE SARS-CoV-2 target nucleic acids are DETECTED. The SARS-CoV-2 RNA is generally detectable in upper and lower  respiratory specimens dur ing the acute phase of infection.  Positive  results are indicative of active infection with SARS-CoV-2.  Clinical  correlation with patient history and other diagnostic information is  necessary to determine patient infection status.  Positive results do  not rule out bacterial infection or co-infection with other viruses. If result is PRESUMPTIVE POSTIVE SARS-CoV-2 nucleic acids MAY BE PRESENT.   A presumptive positive result was obtained on the submitted specimen  and confirmed on repeat testing.  While 2019 novel coronavirus  (SARS-CoV-2) nucleic acids may be present in the submitted sample  additional confirmatory testing may be necessary for epidemiological  and / or clinical management purposes  to differentiate between  SARS-CoV-2 and other Sarbecovirus currently known to infect humans.  If clinically indicated additional testing with an alternate test  methodology [phone]) is advised. The SARS-CoV-2 RNA is generally  detectable in upper and lower respiratory sp ecimens during the acute  phase of infection. The  expected result is Negative. Fact Sheet for Patients:  StrictlyIdeas.no Fact Sheet for Healthcare Providers: BankingDealers.co.za This test is not yet approved or cleared by the [place] FDA and has been authorized for detection and/or diagnosis of SARS-CoV-2 by FDA under an Emergency Use Authorization (EUA).  This EUA will remain in effect (meaning this test can be used) for the duration of the COVID-19 declaration under Section 564(b)(1) of the Act, 21 U.S.C. section 360bbb-3(b)(1), unless the authorization is terminated or revoked sooner. Performed at [facility], [address] [place], [place] [address]   Blood culture (routine x 2)     Status: None   Collection Time: 11/02/18  8:30 AM   Specimen: BLOOD  Result Value Ref Range Status   Specimen Description   Final    BLOOD BLOOD RIGHT FOREARM Performed at [facility] [address] [place], [place] [address]    Special Requests   Final    BOTTLES DRAWN AEROBIC AND ANAEROBIC Blood Culture adequate volume Performed at [facility] [address] [place], [place] [address]    Culture   Final    NO GROWTH 5 DAYS Performed at [facility], [place] [address], [place], [place] [address]    Report Status 11/07/2018 FINAL  Final  Urine culture     Status: None   Collection Time: 11/02/18 12:00 PM   Specimen: Urine, Clean Catch  Result Value Ref Range Status   Specimen Description   Final    URINE, CLEAN CATCH Performed at [facility], [place] [address], [place], [place] [address]    Special Requests   Final    NONE Performed at [facility], [place] [address] [place], [place] [address]    Culture   Final    NO GROWTH Performed at [facility], [place] [address] [place], [place] [address]    Report Status 11/03/2018 FINAL  Final  MRSA PCR Screening     Status: None    Collection Time: 11/02/18 12:05 PM   Specimen: Nasopharyngeal  Result Value Ref Range Status   MRSA by PCR NEGATIVE NEGATIVE Final    Comment:        The GeneXpert MRSA Assay (FDA approved for NASAL specimens only), is one component of a comprehensive MRSA colonization surveillance program. It is not intended to diagnose MRSA infection nor to guide or monitor treatment for MRSA infections. Performed at [facility], [place] [address], [place], [place] [address]   Blood culture (routine x 2)     Status: None   Collection Time: 11/02/18  3:27 PM   Specimen: BLOOD  Result Value Ref Range Status   Specimen Description   Final    BLOOD LEFT ARM Performed at [facility] [address], [place], [place] [address]    Special Requests   Final    BOTTLES DRAWN AEROBIC ONLY Blood Culture adequate volume Performed at [facility] [address], [place], [place] [address]    Culture   Final    NO GROWTH 5 DAYS Performed at [facility], [place] [address] [place],  [address]    Report Status 11/07/2018 FINAL  Final      Studies:  No results found.  Assessment: 78 y.o. male with stage III hepatocellular carcinoma  1. Sepsis with severe lactic acidosis secondary to aspiration pneumonia  2.  Acute respiratory failure with hypoxia, secondary to #1, on  oxygen  3.  Symptomatic anemia from upper GI bleeding, s/p blood transfusion  4. Stage III hepatocellular carcinoma, currently on atezoliaumab (immunotherapy) and Avastin (EGFR inhibitor), last dose 6/24 5. AF with RVR 6. History of CVA 7. Left hip pain, history of left hip replacement  8. Anorexia  9. Deconditioning  10.  Hyperbilirubinemia, worsening, probably related to his liver cancer  Plan:  -Pt is not doing well overall, mainly related to his liver cancer, infection and rapid AF -GI bleeding has stopped. I  reviewed his EGD findings, due to diffuse erosive  gastritis and esophagitis, which was probably the source of his GI bleeding, he is not a candidate for more Avastin treatment  -Due to overall poor PS, he may not be able to restart Atezolizumab either  -Pt is not ready for hospice. He wants to go home, and does have adequate support at home, he would need home care on discharge, and possible home oxygen  -I will f/u in 1-2 weeks to see how his recovery goes, if not well, we will discuss hospice -I will call his son to update him  -I discussed with Dr. [place] Crane today    [name], MD 11/08/2018

## 2018-11-08 NOTE — TOC Progression Note (Signed)
Transition of Care [facility]) - Progression Note    Patient Details  Name: Antonio Manning MRN: [id number] Date of Birth: 16-Aug-1940  Transition of Care [facility]) CM/SW Contact  Shawnae Leiva, Juliann Pulse, RN Phone Number: 11/08/2018, 3:37 PM  Clinical Narrative:[facility] rep Santiago Glad following.Await if home 02 needed.       Expected Discharge Plan: [place] Barriers to Discharge: Continued Medical Work up  Expected Discharge Plan and Services Expected Discharge Plan: [place]   Discharge Planning Services: CM Consult Post Acute Care Choice: Home Health, Durable Medical Equipment Living arrangements for the past 2 months: Single Family Home Expected Discharge Date: (unknown)               DME Arranged: N/A DME Agency: NA       HH Arranged: RN, PT, OT, Nurse's Aide, Speech Therapy, Social Work [place] Agency: [place] ([place]) Date [place]: 11/08/18 Time [place]: [place] ([place]) Interventions    Readmission Risk Interventions Readmission Risk Prevention Plan 11/06/2018  Transportation Screening Complete  PCP or Specialist Appt within 3-5 Days Not Complete  Not Complete comments not ready for d/c  HRI or [place] Complete  Social Work Consult for [place] Planning/Counseling [place] Not Applicable  Medication Review Press photographer) Complete  Some recent data might be hidden

## 2018-11-08 NOTE — Progress Notes (Signed)
OT Cancellation Note  Patient Details Name: Antonio Manning MRN: [id number] DOB: February 28, 1941   Cancelled Treatment:    Reason Eval/Treat Not Completed: Other (comment). Pt did not feel up to trying to move at all with therapy today. HR low 100s to one teens at rest. Family plans home with their assist and are refusing SNF.  Will downgrade OT goals on next visit.  Lukah Goswami 11/08/2018, 4:02 PM  Lesle Chris, OTR/L Acute Rehabilitation Services [phone] [facility] pager [phone] office 11/08/2018

## 2018-11-08 NOTE — TOC Progression Note (Signed)
Transition of Care [facility]) - Progression Note    Patient Details  Name: SAJID RUPPERT MRN: [id number] Date of Birth: [birth year]  [facility]) CM/SW Contact  Natlie Asfour, Juliann Pulse, RN Phone Number: 11/08/2018, 3:24 PM  Clinical Narrative: Son [place], [place] chose [facility] aware. [employer] rep Thedore Mins following if home 02 ordered,& qualifying sats documented. Family agree to transport home on own.      Expected Discharge Plan: Home/Self Care Barriers to Discharge: Continued Medical Work up  Expected Discharge Plan and Services Expected Discharge Plan: Home/Self Care   Discharge Planning Services: CM Consult   Living arrangements for the past 2 months: Single Family Home Expected Discharge Date: (unknown)               DME Arranged: N/A DME Agency: NA       HH Arranged: Patient Refused [place] Agency: NA         Social Determinants of Health (SDOH) Interventions    Readmission Risk Interventions Readmission Risk Prevention Plan 11/06/2018  Transportation Screening Complete  PCP or Specialist Appt within 3-5 Days Not Complete  Not Complete comments not ready for d/c  HRI or [place] Complete  Social Work Consult for [place] Planning/Counseling Complete  Palliative Care Screening Not Applicable  Medication Review Press photographer) Complete  Some recent data might be hidden

## 2018-11-08 NOTE — TOC Progression Note (Signed)
Transition of Care [facility]) - Progression Note    Patient Details  Name: LAVARR PRESIDENT MRN: [id number] Date of Birth: 1940-05-17  Transition of Care [facility]) CM/SW Contact  Danita Proud, Juliann Pulse, RN Phone Number: 11/08/2018, 2:57 PM  Clinical Narrative:Awaiting to talk to [place] Ouch, spouse on phone to be arranged by [name]-will discuss d/c plans likely [facility] choices.       Expected Discharge Plan: Home/Self Care Barriers to Discharge: Continued Medical Work up  Expected Discharge Plan and Services Expected Discharge Plan: Home/Self Care   Discharge Planning Services: CM Consult   Living arrangements for the past 2 months: Single Family Home Expected Discharge Date: (unknown)               DME Arranged: N/A DME Agency: NA       HH Arranged: Patient Refused [place] Agency: NA         Social Determinants of Health (SDOH) Interventions    Readmission Risk Interventions Readmission Risk Prevention Plan 11/06/2018  Transportation Screening Complete  PCP or Specialist Appt within 3-5 Days Not Complete  Not Complete comments not ready for d/c  HRI or [place] Complete  Social Work Consult for [place] Planning/Counseling Complete  Palliative Care Screening Not Applicable  Medication Review Press photographer) Complete  Some recent data might be hidden

## 2018-11-08 NOTE — Progress Notes (Signed)
PT Cancellation Note  Patient Details Name: Antonio Manning MRN: [id number] DOB: 12/13/1940   Cancelled Treatment:     PT attempted this am but deferred on advice of RN 2* pt's elevated HR.  Re-attempted in pm but pt declines 2* fatigue and c/o SOB with minimal exertion.  Will follow.  Debe Coder PT Acute Rehabilitation Services Pager [phone] Office [phone]    [facility] 11/08/2018, 3:29 PM

## 2018-11-08 NOTE — Progress Notes (Addendum)
Progress Note  Patient Name: Antonio Manning Date of Encounter: 11/08/2018  Primary Cardiologist: Sanda Klein, MD   Subjective   "I have trouble breathing, but just want to go home and see if my wife can help me."    Inpatient Medications    Scheduled Meds: . apixaban  5 mg Oral BID  . feeding supplement (ENSURE ENLIVE)  237 mL Oral TID BM  . levothyroxine  50 mcg Oral QAC breakfast  . mouth rinse  15 mL Mouth Rinse BID  . multivitamin with minerals  1 tablet Oral Daily  . pantoprazole  40 mg Oral BID  . sucralfate  1 g Oral TID WC & HS  . vitamin C  250 mg Oral Daily   Continuous Infusions: . amiodarone 30 mg/hr (11/08/18 0603)  . piperacillin-tazobactam (ZOSYN)  IV     PRN Meds: acetaminophen **OR** acetaminophen, albuterol, ondansetron **OR** ondansetron (ZOFRAN) IV   Vital Signs    Vitals:   11/07/18 1742 11/07/18 2032 11/08/18 0015 11/08/18 0358  BP: 107/81 (!) 126/96 123/78 (!) 123/96  Pulse:  (!) 102 91 99  Resp:  16 17 17   Temp:  97.9 F (36.6 C) 98.4 F (36.9 C) 98.1 F (36.7 C)  TempSrc:  Oral Oral Oral  SpO2:  97% 96% 96%  Weight:      Height:        Intake/Output Summary (Last 24 hours) at 11/08/2018 0821 Last data filed at 11/08/2018 0425 Gross per 24 hour  Intake 978.95 ml  Output 375 ml  Net 603.95 ml   Last 3 Weights 11/07/2018 11/05/2018 11/04/2018  Weight (lbs) 151 lb 0.2 oz 166 lb 10.7 oz 161 lb 9.6 oz  Weight (kg) 68.5 kg 75.6 kg 73.3 kg      Telemetry    A fib with RVR  - Personally Reviewed  ECG    A fib yesterday with RVR, LAD low voltage QRS - Personally Reviewed  Physical Exam   GEN: No acute distress.  But anxious states he has problem breathing  Neck: No JVD to flat Cardiac: irreg irreg, no murmurs, rubs, or gallops.  Respiratory: Clear to auscultation bilaterally- ant. GI: tense, nontender, hypoactive BS MS: No edema; No deformity. Neuro:  Nonfocal  Psych: Normal affect   Labs    High Sensitivity Troponin:   Recent  Labs  Lab 11/02/18 1020 11/02/18 1527 11/02/18 1805  TROPONINIHS 10.0 25.0* 36.0*      Cardiac EnzymesNo results for input(s): TROPONINI in the last 168 hours. No results for input(s): TROPIPOC in the last 168 hours.   Chemistry Recent Labs  Lab 11/06/18 0520 11/07/18 0504 11/08/18 0525  NA 141 139 139  K 3.6 3.0* 3.1*  CL 106 105 106  CO2 26 24 22   GLUCOSE 95 105* 109*  BUN 21 22 23   CREATININE 0.43* 0.44* 0.48*  CALCIUM 8.4* 8.3* 8.6*  PROT 5.6* 5.7* 5.7*  ALBUMIN 3.0* 3.0* 3.8  AST 126* 68* 34  ALT 58* 41 22  ALKPHOS 434* 350* 193*  BILITOT 1.6* 1.9* 2.5*  GFRNONAA >60 >60 >60  GFRAA >60 >60 >60  ANIONGAP 9 10 11      Hematology Recent Labs  Lab 11/06/18 0520 11/07/18 0504 11/08/18 0525  WBC 7.9 9.7 10.3  RBC 2.63* 2.92* 2.80*  HGB 8.1* 9.0* 8.7*  HCT 25.8* 28.0* 27.5*  MCV 98.1 95.9 98.2  MCH 30.8 30.8 31.1  MCHC 31.4 32.1 31.6  RDW 17.2* 17.0* 17.3*  PLT 198  247 300    BNPNo results for input(s): BNP, PROBNP in the last 168 hours.   DDimer No results for input(s): DDIMER in the last 168 hours.   Radiology    Dg Chest Port 1 View  Result Date: 11/06/2018 CLINICAL DATA:  Shortness of breath EXAM: PORTABLE CHEST 1 VIEW COMPARISON:  11/03/2018 FINDINGS: Cardiomegaly. Aortic atherosclerosis. Enlargement of bilateral effusions with lower lobe atelectasis. Loop recorder in place. Upper lungs are clear. IMPRESSION: Worsened congestive heart failure with enlarging effusions and lower lobe atelectasis. Electronically Signed   By: Nelson Chimes M.D.   On: 11/06/2018 12:38    Cardiac Studies   Echocardiogram 11/03/2018 IMPRESSIONS  1. The left ventricle has mildly reduced systolic function, with an ejection fraction of 45-50%. The cavity size was normal. There is mildly increased left ventricular wall thickness. Left ventricular diastolic Doppler parameters are consistent with  impaired relaxation. Indeterminate filling pressures The E/e' is 8-15. There is  abnormal septal motion consistent with left bundle branch block. 2. The right ventricle has normal systolic function. The cavity was normal. There is no increase in right ventricular wall thickness. 3. The mitral valve is abnormal. Mild thickening of the mitral valve leaflet. Mitral valve regurgitation is mild to moderate by color flow Doppler. 4. The tricuspid valve is grossly normal. 5. The aortic valve is tricuspid. Mild sclerosis of the aortic valve. Aortic valve regurgitation is trivial by color flow Doppler. No stenosis of the aortic valve. 6. There is mild to moderate dilatation of the ascending aorta measuring 44 mm. 7. The average left ventricular global longitudinal strain is -12.0 %.  SUMMARY  LVEF 45-50%, mild LVH, global hypokinesis and incoordinate septal motion, grade 1 DD, indeterminate LV filling pressure, normal biatrial size, mild to moderate MR, trivial AI, trivial TR, IVC not visualized, no obvious atrial level shunt, abnormal GLS at -12% with predominant septal strain abnormality FINDINGS Left Ventricle: The left ventricle has mildly reduced systolic function, with an ejection fraction of 45-50%. The cavity size was normal. There is mildly increased left ventricular wall thickness. Left ventricular diastolic Doppler parameters are  consistent with impaired relaxation. Indeterminate filling pressures The E/e' is 8-15. There is abnormal (paradoxical) septal motion, consistent with left bundle branch block. The average left ventricular global longitudinal strain is -12.0 %.  Right Ventricle: The right ventricle has normal systolic function. The cavity was normal. There is no increase in right ventricular wall thickness. Left Atrium: Left atrial size was normal in size. Right Atrium: Right atrial size was normal in size. Right atrial pressure is estimated at 8 mmHg. Interatrial Septum: No atrial level shunt detected by color flow Doppler. Pericardium: There is no  evidence of pericardial effusion. Mitral Valve: The mitral valve is abnormal. Mild thickening of the mitral valve leaflet. Mitral valve regurgitation is mild to moderate by color flow Doppler. Tricuspid Valve: The tricuspid valve is grossly normal. Tricuspid valve regurgitation is trivial by color flow Doppler. Aortic Valve: The aortic valve is tricuspid Mild sclerosis of the aortic valve. Aortic valve regurgitation is trivial by color flow Doppler. There is No stenosis of the aortic valve. Pulmonic Valve: The pulmonic valve was grossly normal. Pulmonic valve regurgitation is not visualized by color flow Doppler. Aorta: There is mild to moderate dilatation of the ascending aorta measuring 44 mm. Venous: The inferior vena cava was not well visualized.  Echo 12/27/17: Study Conclusions  - Left ventricle: The cavity size was normal. Systolic function was normal. The estimated ejection fraction was  in the range of 55% to 60%. Wall motion was normal; there were no regional wall motion abnormalities. Doppler parameters are consistent with abnormal left ventricular relaxation (grade 1 diastolic dysfunction). Doppler parameters are consistent with both elevated ventricular end-diastolic filling pressure and elevated left atrial filling pressure. - Aortic valve: There was trivial regurgitation. - Mitral valve: Mild, late systolicprolapse, involving the posterior leaflet. There was mild regurgitation.   ETT 12/27/17:  Blood pressure demonstrated a normal response to exercise.  There was no ST segment deviation noted during stress.  ETT with normal exercise tolerance (6:10); no chest pain; normal BP response; no ST changes; negative adequate ETT; Duke treadmill score 6.   Patient Profile     78 y.o. male with a hx of cryptogenic stroke and subsequent diagnosis of atrial fibrillation with low burden by ILR (Dr. Sallyanne Kuster), ETT negative for ischemia (2019), and chronic  diastolic heart failurenow admitted with acute GIB and PNA, with a fib RVR.   Assessment & Plan    A fib RVR with hx of PAF --BB increased to 100 mg bid but then with hypotension and fluid boluses given  --EF down to 45-50% from 55-60% last week.   --amiodarone added but rate not controlled will increase to 60  --continues on Eliquis   Hypokalemia today at 3.1 I ordered Kdur 40 meq X 3 doses  HTN no on lower end so amlodipine and BB held albumin given X 4  --BP 123/96 improved yesterday   Hypothyroid, with TSH at 29 per IM.    Acute GI bleed transfused X 2 uPRBCs with severe esophagitis and mild gastritis GI evaluated. On carafate  --Hgb 8.7 today   Ascending aortic aneurysm  followed by Dr. Sallyanne Kuster last eval 11/03/18 at 44 mmHg      Abd discomfort per IM    For questions or updates, please contact [place] Please consult [email or web address] for contact info under     Signed, Cecilie Kicks, NP  11/08/2018, 8:21 AM     History and all data above reviewed.  Patient examined.  I agree with the findings as above.  He denies pain.  Nursing says that he has continued SOB.  He is on 4 liters and oxygenating OK.  The patient exam reveals QDI:YMEBRAXEN  ,  Lungs: Clear  ,  Abd: Distended with decreased bowel sounds. , Ext No edema  .  All available labs, radiology testing, previous records reviewed. Agree with documented assessment and plan. Atrial fib:  amio IV increased.  BP seems to be better and I am going to try in add low dose beta blocker.    Antonio Manning  11:11 AM  11/08/2018

## 2018-11-08 NOTE — Progress Notes (Signed)
   11/08/18 1506  Clinical Encounter Type  Visited With Patient  Visit Type Initial  Referral From Chaplain  Consult/Referral To Chaplain  Stress Factors  Patient Stress Factors Exhausted  This chaplain responded to referral for Pt. F/U spiritual care from Chaplain-MS.  The chaplain checked in with Pt. RN-Katie and CM-Kathy before entering the Pt. room.  The chaplain listened to the Pt.'s decision to give the coordination of the Pt. medical care to the Pt. son-[name]. The Pt. often deferred questions to [place] during the conversation with the chaplain . The Pt. stated he has not had much to eat(little applesauce, some ginger ale, and some ice cream) and not much sleep.  The Pt expressed his desire to go home, but understands from PT he has to be strong enough to go home.  The chaplain observed the Pt. becoming tired.  The chaplain informed the Pt. of how to get in touch with a chaplain if needed.

## 2018-11-08 NOTE — TOC Progression Note (Signed)
Transition of Care [facility]) - Progression Note    Patient Details  Name: ARO BARANOWSKI MRN: [id number] Date of Birth: 1941/03/30  Transition of Care [facility]) CM/SW Contact  Janay Canan, Olegario Messier, RN Phone Number: 11/08/2018, 11:20 AM  Clinical Narrative:     [facility] [phone]  Add [facility] my Favor[facility] Quality of Patient Care Rating 4 out of 5 stars Patient Survey Summary Rating 4 out of 5 stars [facility] [phone]  Add [facility] Quality of Patient Care Rating 3 out of 5 stars Patient Survey Summary Rating 5 out of 5 stars [facility] [phone]  Add [facility] Quality of Patient Care Rating 3 out of 5 stars Patient Survey Summary Rating 4 out of 5 stars [facility] [phone]  Add [facility] Quality of Patient Care Rating 4  out of 5 stars Patient Survey Summary Rating 3 out of 5 stars [facility] [phone]  [facility] Quality of Patient Care Rating 4 out of 5 stars Patient Survey Summary Rating 4 out of 5 stars [facility] [place], [place] [phone]  Add [facility] my Favorites Quality of Patient Care Rating 4 out of 5 stars Patient Survey Summary Rating 4 out of 5 stars [facility] [place], [place] [phone]  Add [facility] my Favorites Quality of Patient Care Rating 4  out of 5 stars Patient Survey Summary Rating 4 out of 5 stars [facility] [place] [phone]  Add [facility] [place] my Favor[facility] Quality of Patient Care Rating 4 out of 5 stars Patient Survey Summary Rating 4 out of 5 stars [facility] [place] [phone]  Add [facility] Quality of Patient Care Rating 3  out of 5 stars Patient Survey Summary  Rating 4 out of 5 stars [facility] [phone]  Add [facility] Quality of Patient Care Rating 3 out of 5 stars Patient Survey Summary Rating 4 out of 5 stars [facility] [phone]  Add [facility] Quality of Patient Care Rating 4 out of 5 stars Not Available12 [facility] [phone]  Add [facility] Quality of Patient Care Rating 3  out of 5 stars Patient Survey Summary Rating 3 out of 5 stars [facility] [phone]  Add [facility] [place] my Favorites Quality of Patient Care Rating 3  out of 5 stars Patient Survey Summary Rating 4 out of 5 stars [facility] [phone]  Add [facility] Quality of Patient Care Rating 3  out of 5 stars Patient Survey Summary Rating 4 out of 5 stars [facility] [phone]  Add [facility] Quality of Patient Care Rating 3  out of 5 stars Patient Survey Summary Rating 3 out of 5 stars [facility] [phone]  Add [facility] Quality of Patient Care Rating 3  out of 5 stars Not Available11 [facility] [phone]  Add [facility] Quality of Patient Care Rating 3  out of 5 stars Patient Survey Summary Rating 4 out of 5  stars [facility] [phone]  Add [facility] Quality of Patient Care Rating 4  out of 5 stars Patient Survey Summary Rating 3 out of 5 stars [facility], [place] [phone]     Expected Discharge Plan: Home/Self Care Barriers to Discharge: Continued Medical Work up  Expected Discharge Plan and Services Expected Discharge Plan: Home/Self Care   Discharge Planning Services: CM Consult   Living  arrangements for the past 2 months: Single Family Home Expected Discharge Date: (unknown)               DME Arranged: N/A DME Agency: NA       HH Arranged: Patient Refused HH HH Agency: NA         Social Determinants of Health (SDOH) Interventions    Readmission Risk Interventions Readmission Risk Prevention Plan 11/06/2018  Transportation Screening Complete  PCP or Specialist Appt within 3-5 Days Not Complete  Not Complete comments not ready for d/c  HRI or Home Care Consult Complete  Social Work Consult for Recovery Care Planning/Counseling Complete  Palliative Care Screening Not Applicable  Medication Review Ocean[employer]) Complete  Some recent data might be hidden

## 2018-11-08 NOTE — Progress Notes (Signed)
SATURATION QUALIFICATIONS: (This note is used to comply with regulatory documentation for home oxygen)  Patient Saturations on Room Air at Rest = 89%  Patient Saturations on 2 Liters of oxygen while at rest = 95%  Please briefly explain why patient needs home oxygen: Patients oxygen saturations are 89% on room air at rest.

## 2018-11-08 NOTE — Progress Notes (Signed)
PROGRESS NOTE  Antonio Manning SJG:[id number] DOB: 1941/04/09 DOA: 11/02/2018 PCP: Hulan Fess, MD  HPI/Recap of past 59 hours: 78 year old male with PMH of stage III hepatocellular carcinoma currently on immunotherapy and Avastin, HTN, HLD, hard of hearing, cryptogenic stroke, paroxysmal atrial fibrillation on Eliquis, presented to the ED on 11/02/2018 due to weakness, dyspnea and coffee-ground emesis.  In the ED, patient met sepsis criteria including hypothermia, elevated lactate, tachypnea and tachycardia, initiated on broad-spectrum antibiotics.  He also had acute blood loss anemia/hemoglobin 6.6 due to acute upper GI bleed.  Admitted to stepdown unit for sepsis, acute respiratory failure with hypoxia, elevated lactate, acute upper GI bleed with ABLA and A. fib with RVR.  [facility], cardiology and medical oncology consulted.  Status post EGD 7/3.  Hospital course complicated by acute confusion overnight 7/3, possibly related to medications (Remeron and tramadol), resolved.  On 7/6, weak, dyspneic, hypoxic, suspect pulmonary edema.  11/08/18: Patient was seen and examined at his bedside this morning.  He is more alert and interactive.  States he feels a little better but not back to his baseline.  He denies nausea or abdominal pain at this time.  Mild abdominal distention noted on exam with no tenderness and positive bowel sounds.    Assessment/Plan: Principal Problem:   Sepsis ([place]) Active Problems:   S/P left hip replacement   Fatigue   Hyperlipidemia   History of CVA (cerebrovascular accident)   PAF (paroxysmal atrial fibrillation) (HCC)   Liver lesion   Hepatocellular carcinoma (HCC)   Lactic acidosis   High anion gap metabolic acidosis   Symptomatic anemia   Coffee ground emesis   Hypothyroidism   Atrial fibrillation with RVR (HCC)   Abdominal pain   Left hip pain  Sepsis suspect secondary to aspiration pneumonia Completed Unasyn and Augmentin. With a procalcitonin greater than 5  on 11/07/2018 Started on Zosyn on 11/07/2018 Elevated lactic acid which is trending down Blood cultures negative to date Urine culture no growth Speech therapist consulted to assess for dysphagia  Elevated lactic acid Lactic acid peaked at 3.9 and trended down to 1.7 on 11/08/2018 Continue IV Zosyn Not on IV fluid due to systolic heart failure  Resolving hypotension in the setting of A. fib with RVR Cardiology following Continue to maintain map greater than 65 Rate is currently controlled On IV amiodarone for rhythm control  A. fib with RVR Rate is now controlled  IV Lopressor as needed for heart rate greater than 130 w MAP greater than 70. IV amiodarone for rhythm control  Acute on chronic combined systolic and diastolic CHF 2D echo done on 11/03/2018 showed LVEF 45 to 65% with 1 diastolic dysfunction Continue strict I's and O's and daily weight Cardiology following  Pulmonary edema  Maintain O2 saturation greater than 92% Independently reviewed last chest x-ray which showed cardiomegaly with mild increase in pulmonary vascularity and left pleural effusion.  Hypokalemia Repleted with oral KCl 40 mEq x 3 doses every 4H Repeat BMP and magnesium level in the morning Last magnesium 2.1 on 11/04/2018  Elevated transaminases LFTs continue to trend down Stable diffuse hepatic lesions are noted consistent with metastatic disease or malignancy. Stable 16 mm aortocaval lymph node. Moderate ascites.  Hyperbilirubinemia in the setting of hepatocellular carcinoma Total bilirubin 2.5 today from 1.9 yesterday, from 1.6 on 11/06/2018 No sign of overt bleeding -Mild drop in hemoglobin from 9.0 yesterday to 8.7 today  Acute upper GI bleed/severe erosive esophagitis and mild gastritis  He was treated  supportively with bowel rest/n.p.o., IV Protonix drip, IV octreotide drip.  Interestingly FOBT was negative.  [facility] was consulted and underwent EGD 7/3 with findings as noted below.  No overt  GI bleed.  Tolerating regular consistency diet, twice daily PPI indefinitely and sucralfate for 2 weeks.  Outpatient follow-up with GI with biopsies.  Acute blood loss anemia/symptomatic  Secondary to acute upper GI bleed.  S/p 2 units PRBC and hemoglobin has improved from 6.6 on admission to 8.1 range.  Transfuse as needed for hemoglobin 7 or less.  Eliquis which was held due to GI bleed resumed on 7/5.  Hemoglobin up to 8.7 today.  Continue to monitor serial CBCs.  Resolved high anion gap metabolic acidosis  Anion gap 33 on admission.  Resolved.  Hypothyroidism  TSH 29.022, free T4: 0.97.  Free T3: 1.1.  Currently on Synthroid 50 mcg daily.  ?  Abnormal TSH related to acute illness/cancer.  Clinically appears euthyroid.  Continue current dose of Synthroid, defer to PCP regarding adjusting dose and consider repeating TSH in 4 weeks.  Also hesitant to increase his Synthroid dose given issues with sinus tachycardia/A. fib RVR.  Cardiomyopathy  TTE 7/3: LVEF 45-50%.  No chest pain reported.  Management per cardiology.  Elevated troponin  Likely due to demand ischemia from acute illness discussed above rather than due to ACS.  Denies chest pain  Stage III hepatocellular carcinoma  Oncology input appreciated.  Currently on atezolizumab (immunotherapy) and Avastin, last dose 6/24.  LFTs have worsened 2 days ago but continued to improve.?  Reactive  Stable hepatic metastatic lesions and moderate ascites  History of CVA  Continue Eliquis.  Hyperlipidemia  Continue atorvastatin.  Resolved abdominal pain  Present on admission.  Likely related to esophagitis now seen on EGD.  Stable hepatic metastatic lesions and moderate ascites.  Denies abdominal pain during this visit  Left hip pain  X-ray without fractures or acute findings.  Hypomagnesemia  Replaced.  Hypokalemia  Replaced.    Acute toxic metabolic encephalopathy  Overnight 7/3 patient had  confusion, agitation and had to be restrained.  Possibly due to medications i.e. Remeron which she was not taking at home and tramadol, discontinued.  Mental status appears to be back to baseline.  Avoid physical restraints.  If he gets agitated again, try to get a Air cabin crew.  Also to call patient's youngest son who can face time the patient and try to reorient him and calm him down.  Ascending aortic aneurysm Ascending aorta measuring 44 mm on echo 7/3, chest CT 4/12: 48 mm.  Recommend outpatient follow-up.   DVT prophylaxis: SCDs, Eliquis Code Status: Full Family Communication:  Discussed with son Quita Skye on 11/07/2018.  All questions answered to his satisfaction.  He indicates that his father and family do not want discharge to SNF.    Disposition:  Possible discharge to home with home health services in 1 to 2 days when hemodynamically stable.  Consultants:  [facility] Medical Oncology  Procedures:  EGD 3/7  Antimicrobials:  IV cefepime and vancomycin 7/2-7/3 IV Unasyn 7/3 > 7/5 Augmentin 7/6 >    Objective: Vitals:   11/07/18 1742 11/07/18 2032 11/08/18 0015 11/08/18 0358  BP: 107/81 (!) 126/96 123/78 (!) 123/96  Pulse:  (!) 102 91 99  Resp:  16 17 17   Temp:  97.9 F (36.6 C) 98.4 F (36.9 C) 98.1 F (36.7 C)  TempSrc:  Oral Oral Oral  SpO2:  97% 96% 96%  Weight:  Height:        Intake/Output Summary (Last 24 hours) at 11/08/2018 0849 Last data filed at 11/08/2018 0425 Gross per 24 hour  Intake 978.95 ml  Output 375 ml  Net 603.95 ml   Filed Weights   11/04/18 0500 11/05/18 0500 11/07/18 0606  Weight: 73.3 kg 75.6 kg 68.5 kg    Exam:  . General: 78 y.o. year-old male well-developed well-nourished in no acute distress.  Alert and interactive.   . Cardiovascular: Regular rate and rhythm with no rubs or gallops.  No JVD or thyromegaly noted.   Marland Kitchen Respiratory: Mild rales at bases with no wheezes noted.  Poor expiratory effort. . Abdomen:  Mildly distended nontender on palpation.  Normal bowel sounds present. . Musculoskeletal: Trace lower extremity edema bilaterally.  2 out of 4 pulses in all 4 extremities.   Marland Kitchen Psychiatry: Mood is appropriate for condition and setting..   Data Reviewed: CBC: Recent Labs  Lab 11/02/18 1614  11/04/18 1652 11/05/18 0544 11/06/18 0520 11/07/18 0504 11/08/18 0525  WBC 10.1   < > 6.0 5.9 7.9 9.7 10.3  NEUTROABS 8.4*  --   --   --   --   --  8.2*  HGB 7.4*   < > 7.3* 7.4* 8.1* 9.0* 8.7*  HCT 23.7*   < > 22.7* 23.1* 25.8* 28.0* 27.5*  MCV 100.0   < > 96.6 97.5 98.1 95.9 98.2  PLT 249   < > 160 165 198 247 300   < > = values in this interval not displayed.   Basic Metabolic Panel: Recent Labs  Lab 11/02/18 0827 11/02/18 1527  11/04/18 0709 11/05/18 0544 11/06/18 0520 11/07/18 0504 11/08/18 0525  NA 140 141   < > 138 142 141 139 139  K 3.6 4.1   < > 2.7* 3.9 3.6 3.0* 3.1*  CL 96* 102   < > 101 107 106 105 106  CO2 11* 21*   < > 25 26 26 24 22   GLUCOSE 143* 120*   < > 79 87 95 105* 109*  BUN 23 23   < > 19 22 21 22 23   CREATININE 1.14 0.88   < > 0.52* 0.54* 0.43* 0.44* 0.48*  CALCIUM 9.3 8.6*   < > 7.9* 8.3* 8.4* 8.3* 8.6*  MG 2.0 1.5*  --  2.1  --   --   --   --    < > = values in this interval not displayed.   GFR: Estimated Creatinine Clearance: 71.4 mL/min (A) (by C-G formula based on SCr of 0.48 mg/dL (L)). Liver Function Tests: Recent Labs  Lab 11/04/18 0709 11/05/18 0544 11/06/18 0520 11/07/18 0504 11/08/18 0525  AST 679* 291* 126* 68* 34  ALT 139* 89* 58* 41 22  ALKPHOS 717* 572* 434* 350* 193*  BILITOT 1.6* 1.7* 1.6* 1.9* 2.5*  PROT 5.7* 5.9* 5.6* 5.7* 5.7*  ALBUMIN 3.5 3.3* 3.0* 3.0* 3.8   Recent Labs  Lab 11/02/18 0827  LIPASE 29   Recent Labs  Lab 11/02/18 0914 11/04/18 1652  AMMONIA 41* <9*   Coagulation Profile: Recent Labs  Lab 11/02/18 0830  INR 1.7*   Cardiac Enzymes: Recent Labs  Lab 11/02/18 0827  CKTOTAL 24*   BNP (last 3  results) No results for input(s): PROBNP in the last 8760 hours. HbA1C: No results for input(s): HGBA1C in the last 72 hours. CBG: Recent Labs  Lab 11/02/18 0828 11/03/18 0739  GLUCAP 119* 86   Lipid Profile:  No results for input(s): CHOL, HDL, LDLCALC, TRIG, CHOLHDL, LDLDIRECT in the last 72 hours. Thyroid Function Tests: No results for input(s): TSH, T4TOTAL, FREET4, T3FREE, THYROIDAB in the last 72 hours. Anemia Panel: No results for input(s): VITAMINB12, FOLATE, FERRITIN, TIBC, IRON, RETICCTPCT in the last 72 hours. Urine analysis:    Component Value Date/Time   COLORURINE YELLOW 11/02/2018 1200   APPEARANCEUR HAZY (A) 11/02/2018 1200   LABSPEC 1.014 11/02/2018 1200   PHURINE 5.0 11/02/2018 1200   GLUCOSEU 50 (A) 11/02/2018 1200   HGBUR NEGATIVE 11/02/2018 1200   BILIRUBINUR NEGATIVE 11/02/2018 1200   KETONESUR 20 (A) 11/02/2018 1200   PROTEINUR 100 (A) 11/02/2018 1200   UROBILINOGEN 0.2 05/13/2011 0930   NITRITE NEGATIVE 11/02/2018 1200   LEUKOCYTESUR NEGATIVE 11/02/2018 1200   Sepsis Labs: '@LABRCNTIP'$ (procalcitonin:4,lacticidven:4)  ) Recent Results (from the past 240 hour(s))  SARS Coronavirus 2 (CEPHEID- Performed in [facility] hospital lab), Hosp Order     Status: None   Collection Time: 11/02/18  8:27 AM   Specimen: Nasopharyngeal Swab  Result Value Ref Range Status   SARS Coronavirus 2 NEGATIVE NEGATIVE Final    Comment: (NOTE) If result is NEGATIVE SARS-CoV-2 target nucleic acids are NOT DETECTED. The SARS-CoV-2 RNA is generally detectable in upper and lower  respiratory specimens during the acute phase of infection. The lowest  concentration of SARS-CoV-2 viral copies this assay can detect is 250  copies / mL. A negative result does not preclude SARS-CoV-2 infection  and should not be used as the sole basis for treatment or other  patient management decisions.  A negative result may occur with  improper specimen collection / handling, submission of  specimen other  than nasopharyngeal swab, presence of viral mutation(s) within the  areas targeted by this assay, and inadequate number of viral copies  (<250 copies / mL). A negative result must be combined with clinical  observations, patient history, and epidemiological information. If result is POSITIVE SARS-CoV-2 target nucleic acids are DETECTED. The SARS-CoV-2 RNA is generally detectable in upper and lower  respiratory specimens dur ing the acute phase of infection.  Positive  results are indicative of active infection with SARS-CoV-2.  Clinical  correlation with patient history and other diagnostic information is  necessary to determine patient infection status.  Positive results do  not rule out bacterial infection or co-infection with other viruses. If result is PRESUMPTIVE POSTIVE SARS-CoV-2 nucleic acids MAY BE PRESENT.   A presumptive positive result was obtained on the submitted specimen  and confirmed on repeat testing.  While 2019 novel coronavirus  (SARS-CoV-2) nucleic acids may be present in the submitted sample  additional confirmatory testing may be necessary for epidemiological  and / or clinical management purposes  to differentiate between  SARS-CoV-2 and other Sarbecovirus currently known to infect humans.  If clinically indicated additional testing with an alternate test  methodology [phone]) is advised. The SARS-CoV-2 RNA is generally  detectable in upper and lower respiratory sp ecimens during the acute  phase of infection. The expected result is Negative. Fact Sheet for Patients:  StrictlyIdeas.no Fact Sheet for Healthcare Providers: BankingDealers.co.za This test is not yet approved or cleared by the [place] FDA and has been authorized for detection and/or diagnosis of SARS-CoV-2 by FDA under an Emergency Use Authorization (EUA).  This EUA will remain in effect (meaning this test can be used) for the  duration of the COVID-19 declaration under Section 564(b)(1) of the Act, 21 U.S.C. section 360bbb-3(b)(1), unless the authorization  is terminated or revoked sooner. Performed at [facility], [place] [address] [place], [place] [address]   Blood culture (routine x 2)     Status: None   Collection Time: 11/02/18  8:30 AM   Specimen: BLOOD  Result Value Ref Range Status   Specimen Description   Final    BLOOD BLOOD RIGHT FOREARM Performed at [facility] [address], [place], [place] [address]    Special Requests   Final    BOTTLES DRAWN AEROBIC AND ANAEROBIC Blood Culture adequate volume Performed at [facility] [address], [place], [place] [address]    Culture   Final    NO GROWTH 5 DAYS Performed at [facility], [place] [address] [place], [place] [address]    Report Status 11/07/2018 FINAL  Final  Urine culture     Status: None   Collection Time: 11/02/18 12:00 PM   Specimen: Urine, Clean Catch  Result Value Ref Range Status   Specimen Description   Final    URINE, CLEAN CATCH Performed at [facility], [place] [address] [place], [place] [address]    Special Requests   Final    NONE Performed at [facility], [place] [address] [place], [place] [address]    Culture   Final    NO GROWTH Performed at [facility], [place] [address] [place], [place] [address]    Report Status 11/03/2018 FINAL  Final  MRSA PCR Screening     Status: None   Collection Time: 11/02/18 12:05 PM   Specimen: Nasopharyngeal  Result Value Ref Range Status   MRSA by PCR NEGATIVE NEGATIVE Final    Comment:        The GeneXpert MRSA Assay (FDA approved for NASAL specimens only), is one component of a comprehensive MRSA colonization surveillance program. It is not intended to diagnose MRSA infection nor to guide or monitor treatment for MRSA infections. Performed at [facility], [place] [address] [place], [place] [address]   Blood culture (routine x 2)     Status: None   Collection Time: 11/02/18  3:27 PM   Specimen: BLOOD  Result Value Ref Range Status   Specimen Description   Final    BLOOD LEFT ARM Performed at [facility] [address], [place], [place] [address]    Special Requests   Final    BOTTLES DRAWN AEROBIC ONLY Blood Culture adequate volume Performed at [facility] [address], [place], [place] [address]    Culture   Final    NO GROWTH 5 DAYS Performed at [facility], [place] [address] [place],  [address]    Report Status 11/07/2018 FINAL  Final      Studies: No results found.  Scheduled Meds: . apixaban  5 mg Oral BID  . feeding supplement (ENSURE ENLIVE)  237 mL Oral TID BM  . levothyroxine  50 mcg Oral QAC breakfast  . mouth rinse  15 mL Mouth Rinse BID  . multivitamin with minerals  1 tablet Oral Daily  . pantoprazole  40 mg Oral BID  . potassium chloride  40 mEq Oral Q4H  . sucralfate  1 g Oral TID WC & HS  . vitamin C  250 mg Oral Daily    Continuous Infusions: . amiodarone 30 mg/hr (11/08/18 0603)  . piperacillin-tazobactam (ZOSYN)  IV  LOS: 6 days     Kayleen Memos, MD [facility] Pager [phone]  If 7PM-7AM, please contact night-coverage [email or web address] Password [facility] 11/08/2018, 8:49 AM

## 2018-11-08 NOTE — Progress Notes (Signed)
SLP Cancellation Note  Patient Details Name: Antonio Manning MRN: [id number] DOB: July 22, 1940   Cancelled treatment:       Reason Eval/Treat Not Completed: Patient declined, no reason specified. Patient politely declined PO's at this time. He had a rough night and morning, as he had significant BP dropping, feeling lightheaded and is now fatigued.    Nadara Mode [name] 11/08/2018, 12:53 PM   Sonia Baller, MA, [facility] Pager: [phone]

## 2018-11-09 DIAGNOSIS — E440 Moderate protein-calorie malnutrition: Secondary | ICD-10-CM | POA: Insufficient documentation

## 2018-11-09 DIAGNOSIS — Z515 Encounter for palliative care: Secondary | ICD-10-CM

## 2018-11-09 DIAGNOSIS — Z7189 Other specified counseling: Secondary | ICD-10-CM

## 2018-11-09 DIAGNOSIS — R0602 Shortness of breath: Secondary | ICD-10-CM

## 2018-11-09 LAB — MAGNESIUM: Magnesium: 1.6 mg/dL — ABNORMAL LOW (ref 1.7–2.4)

## 2018-11-09 LAB — CBC WITH DIFFERENTIAL/PLATELET
Abs Immature Granulocytes: 0.18 10*3/uL — ABNORMAL HIGH (ref 0.00–0.07)
Basophils Absolute: 0 10*3/uL (ref 0.0–0.1)
Basophils Relative: 0 %
Eosinophils Absolute: 0 10*3/uL (ref 0.0–0.5)
Eosinophils Relative: 0 %
HCT: 30.3 % — ABNORMAL LOW (ref 39.0–52.0)
Hemoglobin: 9.1 g/dL — ABNORMAL LOW (ref 13.0–17.0)
Immature Granulocytes: 2 %
Lymphocytes Relative: 7 %
Lymphs Abs: 0.8 10*3/uL (ref 0.7–4.0)
MCH: 31.1 pg (ref 26.0–34.0)
MCHC: 30 g/dL (ref 30.0–36.0)
MCV: 103.4 fL — ABNORMAL HIGH (ref 80.0–100.0)
Monocytes Absolute: 1.1 10*3/uL — ABNORMAL HIGH (ref 0.1–1.0)
Monocytes Relative: 10 %
Neutro Abs: 8.8 10*3/uL — ABNORMAL HIGH (ref 1.7–7.7)
Neutrophils Relative %: 81 %
Platelets: 330 10*3/uL (ref 150–400)
RBC: 2.93 MIL/uL — ABNORMAL LOW (ref 4.22–5.81)
RDW: 19 % — ABNORMAL HIGH (ref 11.5–15.5)
WBC: 10.9 10*3/uL — ABNORMAL HIGH (ref 4.0–10.5)
nRBC: 2.9 % — ABNORMAL HIGH (ref 0.0–0.2)

## 2018-11-09 LAB — PROCALCITONIN: Procalcitonin: 1.93 ng/mL

## 2018-11-09 LAB — BASIC METABOLIC PANEL
Anion gap: 14 (ref 5–15)
BUN: 22 mg/dL (ref 8–23)
CO2: 21 mmol/L — ABNORMAL LOW (ref 22–32)
Calcium: 8.4 mg/dL — ABNORMAL LOW (ref 8.9–10.3)
Chloride: 103 mmol/L (ref 98–111)
Creatinine, Ser: 0.49 mg/dL — ABNORMAL LOW (ref 0.61–1.24)
GFR calc Af Amer: >60 mL/min (ref >60)
GFR calc non Af Amer: >60 mL/min (ref >60)
Glucose, Bld: 105 mg/dL — ABNORMAL HIGH (ref 70–99)
Potassium: 3.4 mmol/L — ABNORMAL LOW (ref 3.5–5.1)
Sodium: 138 mmol/L (ref 135–145)

## 2018-11-09 MED ORDER — METOPROLOL TARTRATE 25 MG PO TABS: 12.5000 mg | ORAL_TABLET | Freq: Two times a day (BID) | ORAL | 0 refills | Status: DC

## 2018-11-09 MED ORDER — PANTOPRAZOLE SODIUM 40 MG PO TBEC: 40.0000 mg | DELAYED_RELEASE_TABLET | Freq: Two times a day (BID) | ORAL | 0 refills | Status: DC

## 2018-11-09 MED ORDER — METOPROLOL SUCCINATE ER 25 MG PO TB24: 12.5000 mg | ORAL_TABLET | Freq: Every day | ORAL | 0 refills | Status: AC

## 2018-11-09 MED ORDER — AMIODARONE HCL 400 MG PO TABS: 400.0000 mg | ORAL_TABLET | Freq: Two times a day (BID) | ORAL | 0 refills | Status: DC

## 2018-11-09 MED ORDER — AMOXICILLIN-POT CLAVULANATE 875-125 MG PO TABS
1.0000 | ORAL_TABLET | Freq: Two times a day (BID) | ORAL | Status: DC
  Administered 2018-11-09: 1 via ORAL
  Filled 2018-11-09: qty 1

## 2018-11-09 MED ORDER — AMOXICILLIN-POT CLAVULANATE 875-125 MG PO TABS: 1.0000 | ORAL_TABLET | Freq: Two times a day (BID) | ORAL | 0 refills | Status: AC

## 2018-11-09 MED ORDER — POTASSIUM CHLORIDE CRYS ER 20 MEQ PO TBCR
40.0000 meq | EXTENDED_RELEASE_TABLET | Freq: Once | ORAL | Status: AC
  Administered 2018-11-09: 40 meq via ORAL
  Filled 2018-11-09: qty 2

## 2018-11-09 MED ORDER — SUCRALFATE 1 GM/10ML PO SUSP: 1.0000 g | Freq: Three times a day (TID) | ORAL | 0 refills | Status: AC

## 2018-11-09 MED ORDER — AMIODARONE HCL 200 MG PO TABS
400.0000 mg | ORAL_TABLET | Freq: Two times a day (BID) | ORAL | Status: DC
  Administered 2018-11-09 (×2): 400 mg via ORAL
  Filled 2018-11-09 (×2): qty 2

## 2018-11-09 MED ORDER — AMOXICILLIN-POT CLAVULANATE 875-125 MG PO TABS: 1.0000 | ORAL_TABLET | Freq: Two times a day (BID) | ORAL | 0 refills | Status: DC

## 2018-11-09 MED ORDER — ENSURE ENLIVE PO LIQD: 237.0000 mL | Freq: Two times a day (BID) | ORAL | 0 refills | Status: AC

## 2018-11-09 MED ORDER — AMIODARONE HCL 400 MG PO TABS: 400.0000 mg | ORAL_TABLET | Freq: Two times a day (BID) | ORAL | 0 refills | Status: AC

## 2018-11-09 MED ORDER — MAGNESIUM SULFATE 4 GM/100ML IV SOLN
4.0000 g | Freq: Once | INTRAVENOUS | Status: AC
  Administered 2018-11-09: 4 g via INTRAVENOUS
  Filled 2018-11-09: qty 100

## 2018-11-09 MED ORDER — ENSURE ENLIVE PO LIQD
237.0000 mL | Freq: Two times a day (BID) | ORAL | Status: DC
  Administered 2018-11-09: 237 mL via ORAL

## 2018-11-09 NOTE — Plan of Care (Signed)
  Problem: Education: Goal: Knowledge of General Education information will improve Description: Including pain rating scale, medication(s)/side effects and non-pharmacologic comfort measures Outcome: Progressing   Problem: Health Behavior/Discharge Planning: Goal: Ability to manage health-related needs will improve Outcome: Progressing   Problem: Clinical Measurements: Goal: Ability to maintain clinical measurements within normal limits will improve Outcome: Progressing   Problem: Clinical Measurements: Goal: Will remain free from infection Outcome: Progressing   Problem: Clinical Measurements: Goal: Diagnostic test results will improve Outcome: Progressing   Problem: Clinical Measurements: Goal: Cardiovascular complication will be avoided Outcome: Progressing   

## 2018-11-09 NOTE — Progress Notes (Signed)
Physical Therapy Treatment Patient Details Name: Antonio Manning MRN: [id number] DOB: 05-17-1940 Today's Date: 11/09/2018    History of Present Illness 78 year old man admitted wtih weakness and coffee ground emesis.  H/O [place] on chemo, CVA with expressive aphasia, PAF, and hearing loss. Pt with L THA 2013, pt reports fall and "dislocating hip" but imaging obtained 7/2 WNL.    PT Comments    Patient finally able to participate minimally with PT after couple days being medically unstable.  Able to demonstrate up OOB to chair with short shuffling steps with RW and min A, but heavy mod A for sit to stand.  Currently appropriate for home with 24 hour capable assist (which he assures he will have), follow up HHPT/HHaide and will need transport chair for home.  PT to follow if not d/c.    Follow Up Recommendations  Home health PT;Supervision/Assistance - 24 hour(HH aide)     Equipment Recommendations  Wheelchair (measurements PT)(transport chair)    Recommendations for Other Services       Precautions / Restrictions Precautions Precautions: Fall Precaution Comments: watch HR    Mobility  Bed Mobility Overal bed mobility: Needs Assistance       Supine to sit: Min assist     General bed mobility comments: able to bring legs off bed, assist to lift trunk  Transfers Overall transfer level: Needs assistance Equipment used: Rolling walker (2 wheeled) Transfers: Sit to/from Stand Sit to Stand: Mod assist         General transfer comment: lifting assist from EOB to RW, cue for hand placement  Ambulation/Gait Ambulation/Gait assistance: Min assist Gait Distance (Feet): 3 Feet Assistive device: Rolling walker (2 wheeled) Gait Pattern/deviations: Step-to pattern;Shuffle;Trunk flexed;Decreased stride length     General Gait Details: short shuffling steps with walker to recliner with A for safety/balance   Stairs             Wheelchair Mobility    Modified Rankin (Stroke  Patients Only)       Balance   Sitting-balance support: No upper extremity supported Sitting balance-Leahy Scale: Good     Standing balance support: Bilateral upper extremity supported Standing balance-Leahy Scale: Poor Standing balance comment: reliant on UE support                            Cognition Arousal/Alertness: Awake/alert Behavior During Therapy: WFL for tasks assessed/performed Overall Cognitive Status: Within Functional Limits for tasks assessed                                        Exercises General Exercises - Lower Extremity Hip Flexion/Marching: Strengthening;10 reps;Seated;Both    General Comments General comments (skin integrity, edema, etc.): HR 90, BP 138/80 sitting EOB      Pertinent Vitals/Pain Pain Assessment: No/denies pain    Home Living                      Prior Function            PT Goals (current goals can now be found in the care plan section) Progress towards PT goals: Progressing toward goals    Frequency    Min 3X/week      PT Plan Current plan remains appropriate    Co-evaluation  AM-PAC PT "6 Clicks" Mobility   Outcome Measure  Help needed turning from your back to your side while in a flat bed without using bedrails?: A Little Help needed moving from lying on your back to sitting on the side of a flat bed without using bedrails?: A Little Help needed moving to and from a bed to a chair (including a wheelchair)?: A Little Help needed standing up from a chair using your arms (e.g., wheelchair or bedside chair)?: A Lot Help needed to walk in hospital room?: A Little Help needed climbing 3-5 steps with a railing? : Total 6 Click Score: 15    End of Session Equipment Utilized During Treatment: Gait belt;Oxygen Activity Tolerance: Patient limited by fatigue Patient left: in chair;with call bell/phone within reach;with chair alarm set Nurse Communication:  Mobility status PT Visit Diagnosis: Other abnormalities of gait and mobility (R26.89);Difficulty in walking, not elsewhere classified (R26.2)     Time: 1355-1415 PT Time Calculation (min) (ACUTE ONLY): 20 min  Charges:  $Therapeutic Activity: 8-22 mins                     Magda Kiel, PT Acute Rehabilitation Services [phone] 11/09/2018    Reginia Naas 11/09/2018, 4:09 PM

## 2018-11-09 NOTE — Discharge Instructions (Signed)
Shortness of Breath, Adult Shortness of breath means you have trouble breathing. Shortness of breath could be a sign of a medical problem. Follow these instructions at home:   Watch for any changes in your symptoms.  Do not use any products that contain nicotine or tobacco, such as cigarettes, e-cigarettes, and chewing tobacco.  Do not smoke. Smoking can cause shortness of breath. If you need help to quit smoking, ask your doctor.  Avoid things that can make it harder to breathe, such as: ? Mold. ? Dust. ? Air pollution. ? Chemical smells. ? Things that can cause allergy symptoms (allergens), if you have allergies.  Keep your living space clean. Use products that help remove mold and dust.  Rest as needed. Slowly return to your normal activities.  Take over-the-counter and prescription medicines only as told by your doctor. This includes oxygen therapy and inhaled medicines.  Keep all follow-up visits as told by your doctor. This is important. Contact a doctor if:  Your condition does not get better as soon as expected.  You have a hard time doing your normal activities, even after you rest.  You have new symptoms. Get help right away if:  Your shortness of breath gets worse.  You have trouble breathing when you are resting.  You feel light-headed or you pass out (faint).  You have a cough that is not helped by medicines.  You cough up blood.  You have pain with breathing.  You have pain in your chest, arms, shoulders, or belly (abdomen).  You have a fever.  You cannot walk up stairs.  You cannot exercise the way you normally do. These symptoms may represent a serious problem that is an emergency. Do not wait to see if the symptoms will go away. Get medical help right away. Call your local emergency services (911 in the [place] Do not drive yourself to the hospital. Summary  Shortness of breath is when you have trouble breathing enough air. It can be a sign of a  medical problem.  Avoid things that make it hard for you to breathe, such as smoking, pollution, mold, and dust.  Watch for any changes in your symptoms. Contact your doctor if you do not get better or you get worse. This information is not intended to replace advice given to you by your health care provider. Make sure you discuss any questions you have with your health care provider. Document Released: 10/06/2007 Document Revised: 09/19/2017 Document Reviewed: 09/19/2017 Elsevier Patient Education  2020 Else[employer] Inc.  

## 2018-11-09 NOTE — Progress Notes (Signed)
  Speech Language Pathology Treatment: Dysphagia  Patient Details Name: Antonio Manning MRN: [id number] DOB: 1941-04-19 Today's Date: 11/09/2018 Time: 1215-1232 SLP Time Calculation (min) (ACUTE ONLY): 17 min  Assessment / Plan / Recommendation Clinical Impression  Patient was observed while eating lunch (Dys 3, thin liquids). Pt tolerated soup, thin liquids and fruit with no throat clearing or coughing. His vocal quality remained clear with no changes in respiration. Reviews aspiration precautions with patient. Recommend continuing current diet.    HPI HPI: Patient is an 78 y.o. male with PMH: stage III hepatocellular carcinoma currently on immunotherapy and Avastin, HTN, HDL, HOH, crytogenic CVA, afib, who presented to ED on 7/2 with weakness, dyspnea, coffee ground emesis. In ED, he met criteria for sepsis and was found to be with upper GI bleed. EGD performed on 7/3. on 7/6, patient with increased weakness, dyspneic, hypoxic and with suspected pulmonary edema.      SLP Plan  Continue with current plan of care       Recommendations  Diet recommendations: Dysphagia 3 (mechanical soft);Thin liquid Liquids provided via: Cup;Straw Medication Administration: Whole meds with liquid Supervision: Patient able to self feed Compensations: Slow rate;Small sips/bites Postural Changes and/or Swallow Maneuvers: Seated upright 90 degrees;Upright 30-60 min after meal                Plan: Continue with current plan of care       [place], MA, CCC-SLP 11/09/2018 12:51 PM

## 2018-11-09 NOTE — Plan of Care (Signed)
  Problem: Education: Goal: Knowledge of General Education information will improve Description: Including pain rating scale, medication(s)/side effects and non-pharmacologic comfort measures 11/09/2018 1605 by Timoteo Gaul, RN Outcome: Completed/Met 11/09/2018 0818 by Timoteo Gaul, RN Outcome: Progressing   Problem: Health Behavior/Discharge Planning: Goal: Ability to manage health-related needs will improve 11/09/2018 1605 by Timoteo Gaul, RN Outcome: Completed/Met 11/09/2018 0818 by Timoteo Gaul, RN Outcome: Progressing   Problem: Clinical Measurements: Goal: Ability to maintain clinical measurements within normal limits will improve 11/09/2018 1605 by Timoteo Gaul, RN Outcome: Completed/Met 11/09/2018 0818 by Timoteo Gaul, RN Outcome: Progressing Goal: Will remain free from infection 11/09/2018 1605 by Timoteo Gaul, RN Outcome: Completed/Met 11/09/2018 0818 by Timoteo Gaul, RN Outcome: Progressing Goal: Diagnostic test results will improve 11/09/2018 1605 by Timoteo Gaul, RN Outcome: Completed/Met 11/09/2018 0818 by Timoteo Gaul, RN Outcome: Progressing Goal: Respiratory complications will improve 11/09/2018 1605 by Timoteo Gaul, RN Outcome: Completed/Met 11/09/2018 0818 by Timoteo Gaul, RN Outcome: Progressing Goal: Cardiovascular complication will be avoided 11/09/2018 1605 by Timoteo Gaul, RN Outcome: Completed/Met 11/09/2018 0818 by Timoteo Gaul, RN Outcome: Progressing   Problem: Activity: Goal: Risk for activity intolerance will decrease 11/09/2018 1605 by Timoteo Gaul, RN Outcome: Completed/Met 11/09/2018 0818 by Timoteo Gaul, RN Outcome: Progressing   Problem: Nutrition: Goal: Adequate nutrition will be maintained 11/09/2018 1605 by Timoteo Gaul, RN Outcome: Completed/Met 11/09/2018 0818 by Timoteo Gaul, RN Outcome: Progressing   Problem: Coping: Goal: Level of anxiety will decrease 11/09/2018 1605 by Timoteo Gaul, RN Outcome:  Completed/Met 11/09/2018 0818 by Timoteo Gaul, RN Outcome: Progressing   Problem: Elimination: Goal: Will not experience complications related to bowel motility 11/09/2018 1605 by Timoteo Gaul, RN Outcome: Completed/Met 11/09/2018 0818 by Timoteo Gaul, RN Outcome: Progressing Goal: Will not experience complications related to urinary retention 11/09/2018 1605 by Timoteo Gaul, RN Outcome: Completed/Met 11/09/2018 0818 by Timoteo Gaul, RN Outcome: Progressing   Problem: Pain Managment: Goal: General experience of comfort will improve 11/09/2018 1605 by Timoteo Gaul, RN Outcome: Completed/Met 11/09/2018 0818 by Timoteo Gaul, RN Outcome: Progressing   Problem: Safety: Goal: Ability to remain free from injury will improve 11/09/2018 1605 by Timoteo Gaul, RN Outcome: Completed/Met 11/09/2018 0818 by Timoteo Gaul, RN Outcome: Progressing   Problem: Skin Integrity: Goal: Risk for impaired skin integrity will decrease 11/09/2018 1605 by Timoteo Gaul, RN Outcome: Completed/Met 11/09/2018 0818 by Timoteo Gaul, RN Outcome: Progressing

## 2018-11-09 NOTE — TOC Transition Note (Signed)
Transition of Care [facility]) - CM/SW Discharge Note   Patient Details  Name: NILO FALLIN MRN: [id number] Date of Birth: October 16, 1940  Transition of Care [facility]) CM/SW Contact:  Dessa Phi, RN Phone Number: 11/09/2018, 3:07 PM   Clinical Narrative:  D/c plan home w/[facility]-PT will check with puse ox on the visit-they will not leave the pulse ox in the home- rep Santiago Glad aware, palliative care servie otpt-provided w/resource for palliative otpt. Home 02-[employer] rep Zach aware-to deliver 02 to home. PTAR to be called once home 02 in the home-forms in shadow chart for Nurse to call PTAR.     Final next level of care: [place] Barriers to Discharge: No Barriers Identified   Patient Goals and CMS Choice Patient states their goals for this hospitalization and ongoing recovery are:: to go home CMS Medicare.gov Compare Post Acute Care list provided to:: Patient Represenative (must comment)([name]  Son) Choice offered to / list presented to : Spouse, Adult Children  Discharge Placement                       Discharge Plan and Services   Discharge Planning Services: CM Consult Post Acute Care Choice: Home Health, Durable Medical Equipment          DME Arranged: Oxygen DME Agency: [employer] Date DME Agency Contacted: 11/09/18 Time DME Agency Contacted: 77 Representative spoke with at DME Agency: Thedore Mins HH Arranged: RN, PT, OT, Nurse's Aide, Speech Therapy, Social Work [employer] Agency: [place] ([place]) Date [place]: 11/08/18 Time [place]: [place] ([place]) Interventions     Readmission Risk Interventions Readmission Risk Prevention Plan 11/06/2018  Transportation Screening Complete  PCP or Specialist Appt within 3-5 Days Not Complete  Not Complete comments not ready for d/c  HRI or [place] Complete  Social Work Consult for [place] Planning/Counseling [place] Screening  Not Applicable  Medication Review Press photographer) Complete  Some recent data might be hidden

## 2018-11-09 NOTE — Progress Notes (Signed)
[facility] Summary Report / Loop Recorder 

## 2018-11-09 NOTE — Care Management (Signed)
    Durable Medical Equipment  (From admission, onward)         Start     Ordered   11/09/18 0453  For home use only DME oxygen  Once    Question Answer Comment  Length of Need 6 Months   Mode or (Route) Nasal cannula   Liters per Minute 2   Frequency Continuous (stationary and portable oxygen unit needed)   Oxygen conserving device Yes   Oxygen delivery system Gas      11/09/18 0452

## 2018-11-09 NOTE — Care Management Important Message (Signed)
Important Message  Patient Details IM Letter given to Dessa Phi RN to present to the Patient Name: Antonio Manning MRN: [id number] Date of Birth: 1940/09/20   Medicare Important Message Given:  Yes     Kerin Salen 11/09/2018, 12:15 PM

## 2018-11-09 NOTE — Progress Notes (Signed)
Nutrition Follow-up  DOCUMENTATION CODES:   Non-severe (moderate) malnutrition in context of chronic illness  INTERVENTION:  - continue Ensure Enlive but will decrease from TID to BID. - continue Magic Cup BID. - continue to encourage PO intakes. - recommend trial of appetite stimulant for patient who is remaining full code.   NUTRITION DIAGNOSIS:   Moderate Malnutrition related to chronic illness, cancer and cancer related treatments as evidenced by moderate fat depletion, severe fat depletion, moderate muscle depletion, severe muscle depletion. -revised  GOAL:   Patient will meet greater than or equal to 90% of their needs -unmet  MONITOR:   PO intake, Supplement acceptance, Weight trends, Labs  ASSESSMENT:   78 year old male with medical history of stage 3 hepatocellular carcinoma currently on immunotherapy and Avastin, HTN, HLD, hard of hearing, cryptogenic stroke, and afib on Eliquis. He presented to the ED on 7/2 due to weakness, dyspnea and coffee-ground emesis. In the ED, patient met sepsis criteria including hypothermia, elevated lactate, tachypnea and tachycardia, initiated on broad-spectrum antibiotics. He also had acute blood loss anemia/hemoglobin 6.6 due to acute upper GI bleed. Admitted to SDU for sepsis, acute respiratory failure with hypoxia, elevated lactate, acute upper GI bleed with ABLA and A. fib with RVR.  [facility], cardiology and medical oncology consulted. Status post EGD 7/3.  Hospital course complicated by acute confusion overnight 7/3, possibly related to medications (Remeron and tramadol), much improved and stable now.  Current weigh consistent with admission (7/2) weight. Diet downgraded from Regular to Dysphagia 3, thin liquids on 7/7 at 4:00 PM. Per RN flow sheet, patient consumed 0% of lunch on 7/6, 0% of lunch on 7/7, 0% of all meals on 7/8, and 20% of breakfast this AM (158 kcal, 4 grams protein).  Patient has refused all bottles Ensure except 1 since  order placed on 7/5. Patient reports liking Ensure mixed with ice cream to make a milkshake. Able to talk with RN who reports she has been doing this for patient but that he has been refusing and has mainly only wanted ginger ale to drink. Talked with her about mixing prostat into ginger ale, if patient is open to it, to increase protein intake.   Visualized lunch tray with <25% completion at the time of visit. Patient denies abdominal pain/pressure or nausea. He denies having any swallowing pain today. He states that poor appetite began PTA is is a lack of interest in eating, not feeling up to eating, not feeling hungry. He is unsure of when exactly this started.   SLP is following patient and last saw him earlier today with recommendation to continue current diet order: Dysphagia 3, thin liquids.  Palliative Care note in from a short time ago. Per note, plan to continue full code, recommendation for home with home health, recommendation for outpatient palliative care.  Per notes: - sepsis which is suspected to be 2/2 aspiration PNA - elevated lactic acid - acute on CHF - pulmonary edema - persistent hypokalemia with repletion ordered - slight hypomagnesemia - severe erosive esophagitis and mild gastritis with no overt GI bleeding - resolved acute toxic metabolic encephalopathy with patient now a/o x4 - overall poor prognosis   Medications reviewed; 50 mcg oral synthroid/day, daily multivitamin with minerals, 40 mg oral protonix BID, 40 mEq K-Dur x1 dose 7/9, 1 g carafate TID, 250 mg ascorbic acid/day.  Labs reviewed; K: 3.4 mmol/l, creatinine: 0.49 mg/dl, Ca: 8.4 mg/dl, Mg: 1.6 mg/dl.     NUTRITION - FOCUSED PHYSICAL EXAM:  Most Recent Value  Orbital Region  Moderate depletion  Upper Arm Region  Severe depletion  Thoracic and Lumbar Region  Unable to assess  Buccal Region  Moderate depletion  Temple Region  Mild depletion  Clavicle Bone Region  Severe depletion  Clavicle and  Acromion Bone Region  Severe depletion  Scapular Bone Region  Unable to assess  Dorsal Hand  Moderate depletion  Patellar Region  Moderate depletion  Anterior Thigh Region  Unable to assess  Posterior Calf Region  Moderate depletion  Edema (RD Assessment)  Mild [LLE]  Hair  Reviewed  Eyes  Reviewed  Mouth  Reviewed  Skin  Reviewed  Nails  Reviewed       Diet Order:   Diet Order            DIET DYS 3 Room service appropriate? Yes with Assist; Fluid consistency: Thin  Diet effective now              EDUCATION NEEDS:   No education needs have been identified at this time  Skin:  Skin Assessment: Reviewed RN Assessment  Last BM:  7/9  Height:   Ht Readings from Last 1 Encounters:  11/02/18 6' (1.829 m)    Weight:   Wt Readings from Last 1 Encounters:  11/07/18 68.5 kg    Ideal Body Weight:  83.6 kg  BMI:  Body mass index is 20.48 kg/m.  Estimated Nutritional Needs:   Kcal:  2015-2215 kcal  Protein:  100-115 grams  Fluid:  >/= 2 L/day     Jarome Matin, MS, RD, LDN, [facility] Inpatient Clinical Dietitian Pager # [phone] After hours/weekend pager # [phone]

## 2018-11-09 NOTE — Progress Notes (Signed)
Called patient's son Quita Skye and gave updates via phone. [phone]. All questions answered to his satisfaction.

## 2018-11-09 NOTE — Progress Notes (Signed)
PROGRESS NOTE  Antonio Manning RUE:[id number] DOB: 04-Feb-1941 DOA: 11/02/2018 PCP: Hulan Fess, MD  HPI/Recap of past 73 hours: 78 year old male with PMH of stage III hepatocellular carcinoma currently on immunotherapy and Avastin, HTN, HLD, hard of hearing, cryptogenic stroke, paroxysmal atrial fibrillation on Eliquis, presented to the ED on 11/02/2018 due to weakness, dyspnea and coffee-ground emesis.  In the ED, patient met sepsis criteria including hypothermia, elevated lactate, tachypnea and tachycardia, initiated on broad-spectrum antibiotics.  He also had acute blood loss anemia/hemoglobin 6.6 due to acute upper GI bleed.  Admitted to stepdown unit for sepsis, acute respiratory failure with hypoxia, elevated lactate, acute upper GI bleed with ABLA and A. fib with RVR.  [facility], cardiology and medical oncology consulted.  Status post EGD 7/3.  Hospital course complicated by acute confusion overnight 7/3, possibly related to medications (Remeron and tramadol), resolved.  On 7/6, weak, dyspneic, hypoxic, suspect pulmonary edema.  11/09/18: Patient seen and examined at his bedside.  He has no new complaints.  Mild abdominal distention noted on exam, denies any tenderness or dyspnea with distention. Soft blood pressure this morning, metoprolol held.     Assessment/Plan: Principal Problem:   Sepsis ([place]) Active Problems:   S/P left hip replacement   Fatigue   Hyperlipidemia   History of CVA (cerebrovascular accident)   PAF (paroxysmal atrial fibrillation) (HCC)   Liver lesion   Hepatocellular carcinoma (HCC)   Lactic acidosis   High anion gap metabolic acidosis   Symptomatic anemia   Coffee ground emesis   Hypothyroidism   Atrial fibrillation with RVR (HCC)   Abdominal pain   Left hip pain  Sepsis suspect secondary to aspiration pneumonia Completed Unasyn and Augmentin. With a procalcitonin greater than 5 on 11/07/2018 Currently on Zosyn since 11/07/2018 Elevated lactic acid which is  trending down Blood cultures negative to date Urine culture no growth Speech therapist consulted to assess for dysphagia Procalcitonin 1.93 on 11/09/2018 from 5.39 on 11/07/18  Elevated lactic acid Lactic acid peaked at 3.9 and trended down to 1.7 on 11/08/2018 Continue IV Zosyn Not on IV fluid due to systolic heart failure  Paroxysmal A. fib with RVR with hypotension Continue to maintain map greater than 65 Rate is currently controlled On IV amiodarone for rhythm control On metoprolol for rate control Cardiology following  Acute on chronic combined systolic and diastolic CHF 2D echo done on 11/03/2018 showed LVEF 45 to 14% with 1 diastolic dysfunction Continue strict I's and O's and daily weight Cardiology following  Pulmonary edema  Maintain O2 saturation greater than 92% Independently reviewed last chest x-ray which showed cardiomegaly with mild increase in pulmonary vascularity and left pleural effusion.  Hypokalemia, persistent Repleted Repeat BMP in the morning  Hypomagnesemia Magnesium 1.6 Repleted with IV magnesium 4 g once  Elevated transaminases LFTs continue to trend down Stable diffuse hepatic lesions are noted consistent with metastatic disease or malignancy. Stable 16 mm aortocaval lymph node. Moderate ascites.  Hyperbilirubinemia in the setting of hepatocellular carcinoma Total bilirubin 2.5 today from 1.9 yesterday, from 1.6 on 11/06/2018 No sign of overt bleeding -Mild drop in hemoglobin from 9.0 yesterday to 8.7 today -Discussed with Dr. Burr Medico, will follow-up outpatient  Acute upper GI bleed/severe erosive esophagitis and mild gastritis  He was treated supportively with bowel rest/n.p.o., IV Protonix drip, IV octreotide drip.  Interestingly FOBT was negative.  [facility] was consulted and underwent EGD 7/3 with findings as noted below.  No overt GI bleed.  Tolerating regular consistency  diet, twice daily PPI indefinitely and sucralfate for 2 weeks.   Outpatient follow-up with GI with biopsies.  Acute blood loss anemia/symptomatic  Secondary to acute upper GI bleed.  S/p 2 units PRBC and hemoglobin has improved from 6.6 on admission to 8.1 range.  Transfuse as needed for hemoglobin 7 or less.  Eliquis which was held due to GI bleed resumed on 7/5. Hemoglobin stable 9.1 on 11/09/2018 from 8.7 yesterday  High anion gap metabolic acidosis  Anion gap 14, bicarb 21  Continue to follow  Hypothyroidism  TSH 29.022, free T4: 0.97.  Free T3: 1.1.  Currently on Synthroid 50 mcg daily.  ?  Abnormal TSH related to acute illness/cancer.  Clinically appears euthyroid.  Continue current dose of Synthroid, defer to PCP regarding adjusting dose and consider repeating TSH in 4 weeks.  Also hesitant to increase his Synthroid dose given issues with sinus tachycardia/A. fib RVR.  Cardiomyopathy  TTE 7/3: LVEF 45-50%.  No chest pain reported.  Management per cardiology.  Elevated troponin  Likely due to demand ischemia from acute illness discussed above rather than due to ACS.  Denies chest pain  Stage III hepatocellular carcinoma  Oncology input appreciated.  Currently on atezolizumab (immunotherapy) and Avastin, last dose 6/24.  LFTs have worsened 2 days ago but continued to improve.?  Reactive  Stable hepatic metastatic lesions and moderate ascites  History of CVA  Continue Eliquis.  Hyperlipidemia  Lipitor held due to elevated LFTs  Resolved abdominal pain  Present on admission.  Likely related to esophagitis now seen on EGD.  Stable hepatic metastatic lesions and moderate ascites.  Denies abdominal pain  Left hip pain  X-ray without fractures or acute findings.  Resolved acute toxic metabolic encephalopathy  Patient is alert oriented x4.  Ascending aortic aneurysm Ascending aorta measuring 44 mm on echo 7/3, chest CT 4/12: 48 mm.  Recommend outpatient follow-up.  Goals of care Discussed with Dr. Burr Medico on  11/08/2018 via phone Overall poor prognosis, if no improvement may consider hospice.    DVT prophylaxis: SCDs, Eliquis Code Status: Full Family Communication:  Discussed with son Quita Skye on 11/07/2018 and 11/08/2018.  All questions answered to his satisfaction.  He indicates that his father and family do not want discharge to SNF.    Disposition:  Possible discharge to home with home health services in 1 to 2 days when hemodynamically stable.  Consultants:  [facility] Medical Oncology  Procedures:  EGD 3/7  Antimicrobials:  IV cefepime and vancomycin 7/2-7/3 IV Unasyn 7/3 > 7/5 Augmentin 7/6 >    Objective: Vitals:   11/09/18 0143 11/09/18 0548 11/09/18 0935 11/09/18 0944  BP: 118/88 (!) 128/95 99/70 91/72   Pulse: 65 82 96 94  Resp: 14 16  19   Temp: 97.8 F (36.6 C) 97.8 F (36.6 C)  97.6 F (36.4 C)  TempSrc: Oral Oral  Oral  SpO2: 96% 96%  96%  Weight:      Height:        Intake/Output Summary (Last 24 hours) at 11/09/2018 1137 Last data filed at 11/09/2018 0900 Gross per 24 hour  Intake 684.05 ml  Output 450 ml  Net 234.05 ml   Filed Weights   11/04/18 0500 11/05/18 0500 11/07/18 0606  Weight: 73.3 kg 75.6 kg 68.5 kg    Exam:  . General: 78 y.o. year-old male chronically ill-appearing in no acute distress.  Alert and oriented x4.  Hard of hearing.   . Cardiovascular: Regular rate and rhythm with  no rubs or gallops no JVD or thyromegaly. Marland Kitchen Respiratory: Mild rales at bases with no wheezes noted.  Poor inspiratory effort. . Abdomen: Mildly distended nontender on palpation hypoactive bowel sounds. . Musculoskeletal: No lower extremity edema.  2 out of 4 pulses in all 4 extremities.  Marland Kitchen Psychiatry: Mood is appropriate for condition and setting.   Data Reviewed: CBC: Recent Labs  Lab 11/02/18 1614  11/05/18 0544 11/06/18 0520 11/07/18 0504 11/08/18 0525 11/09/18 0532  WBC 10.1   < > 5.9 7.9 9.7 10.3 10.9*  NEUTROABS 8.4*  --   --   --   --  8.2*  8.8*  HGB 7.4*   < > 7.4* 8.1* 9.0* 8.7* 9.1*  HCT 23.7*   < > 23.1* 25.8* 28.0* 27.5* 30.3*  MCV 100.0   < > 97.5 98.1 95.9 98.2 103.4*  PLT 249   < > 165 198 247 300 330   < > = values in this interval not displayed.   Basic Metabolic Panel: Recent Labs  Lab 11/02/18 1527  11/04/18 0709 11/05/18 0544 11/06/18 0520 11/07/18 0504 11/08/18 0525 11/09/18 0532  NA 141   < > 138 142 141 139 139 138  K 4.1   < > 2.7* 3.9 3.6 3.0* 3.1* 3.4*  CL 102   < > 101 107 106 105 106 103  CO2 21*   < > 25 26 26 24 22  21*  GLUCOSE 120*   < > 79 87 95 105* 109* 105*  BUN 23   < > 19 22 21 22 23 22   CREATININE 0.88   < > 0.52* 0.54* 0.43* 0.44* 0.48* 0.49*  CALCIUM 8.6*   < > 7.9* 8.3* 8.4* 8.3* 8.6* 8.4*  MG 1.5*  --  2.1  --   --   --   --  1.6*   < > = values in this interval not displayed.   GFR: Estimated Creatinine Clearance: 71.4 mL/min (A) (by C-G formula based on SCr of 0.49 mg/dL (L)). Liver Function Tests: Recent Labs  Lab 11/04/18 0709 11/05/18 0544 11/06/18 0520 11/07/18 0504 11/08/18 0525  AST 679* 291* 126* 68* 34  ALT 139* 89* 58* 41 22  ALKPHOS 717* 572* 434* 350* 193*  BILITOT 1.6* 1.7* 1.6* 1.9* 2.5*  PROT 5.7* 5.9* 5.6* 5.7* 5.7*  ALBUMIN 3.5 3.3* 3.0* 3.0* 3.8   No results for input(s): LIPASE, AMYLASE in the last 168 hours. Recent Labs  Lab 11/04/18 1652  AMMONIA <9*   Coagulation Profile: No results for input(s): INR, PROTIME in the last 168 hours. Cardiac Enzymes: No results for input(s): CKTOTAL, CKMB, CKMBINDEX, TROPONINI in the last 168 hours. BNP (last 3 results) No results for input(s): PROBNP in the last 8760 hours. HbA1C: No results for input(s): HGBA1C in the last 72 hours. CBG: Recent Labs  Lab 11/03/18 0739  GLUCAP 86   Lipid Profile: No results for input(s): CHOL, HDL, LDLCALC, TRIG, CHOLHDL, LDLDIRECT in the last 72 hours. Thyroid Function Tests: No results for input(s): TSH, T4TOTAL, FREET4, T3FREE, THYROIDAB in the last 72 hours.  Anemia Panel: No results for input(s): VITAMINB12, FOLATE, FERRITIN, TIBC, IRON, RETICCTPCT in the last 72 hours. Urine analysis:    Component Value Date/Time   COLORURINE YELLOW 11/02/2018 1200   APPEARANCEUR HAZY (A) 11/02/2018 1200   LABSPEC 1.014 11/02/2018 1200   PHURINE 5.0 11/02/2018 1200   GLUCOSEU 50 (A) 11/02/2018 1200   HGBUR NEGATIVE 11/02/2018 1200   BILIRUBINUR NEGATIVE 11/02/2018 1200  KETONESUR 20 (A) 11/02/2018 1200   PROTEINUR 100 (A) 11/02/2018 1200   UROBILINOGEN 0.2 05/13/2011 0930   NITRITE NEGATIVE 11/02/2018 1200   LEUKOCYTESUR NEGATIVE 11/02/2018 1200   Sepsis Labs: '@LABRCNTIP'$ (procalcitonin:4,lacticidven:4)  ) Recent Results (from the past 240 hour(s))  SARS Coronavirus 2 (CEPHEID- Performed in [facility] hospital lab), Hosp Order     Status: None   Collection Time: 11/02/18  8:27 AM   Specimen: Nasopharyngeal Swab  Result Value Ref Range Status   SARS Coronavirus 2 NEGATIVE NEGATIVE Final    Comment: (NOTE) If result is NEGATIVE SARS-CoV-2 target nucleic acids are NOT DETECTED. The SARS-CoV-2 RNA is generally detectable in upper and lower  respiratory specimens during the acute phase of infection. The lowest  concentration of SARS-CoV-2 viral copies this assay can detect is 250  copies / mL. A negative result does not preclude SARS-CoV-2 infection  and should not be used as the sole basis for treatment or other  patient management decisions.  A negative result may occur with  improper specimen collection / handling, submission of specimen other  than nasopharyngeal swab, presence of viral mutation(s) within the  areas targeted by this assay, and inadequate number of viral copies  (<250 copies / mL). A negative result must be combined with clinical  observations, patient history, and epidemiological information. If result is POSITIVE SARS-CoV-2 target nucleic acids are DETECTED. The SARS-CoV-2 RNA is generally detectable in upper and lower   respiratory specimens dur ing the acute phase of infection.  Positive  results are indicative of active infection with SARS-CoV-2.  Clinical  correlation with patient history and other diagnostic information is  necessary to determine patient infection status.  Positive results do  not rule out bacterial infection or co-infection with other viruses. If result is PRESUMPTIVE POSTIVE SARS-CoV-2 nucleic acids MAY BE PRESENT.   A presumptive positive result was obtained on the submitted specimen  and confirmed on repeat testing.  While 2019 novel coronavirus  (SARS-CoV-2) nucleic acids may be present in the submitted sample  additional confirmatory testing may be necessary for epidemiological  and / or clinical management purposes  to differentiate between  SARS-CoV-2 and other Sarbecovirus currently known to infect humans.  If clinically indicated additional testing with an alternate test  methodology [phone]) is advised. The SARS-CoV-2 RNA is generally  detectable in upper and lower respiratory sp ecimens during the acute  phase of infection. The expected result is Negative. Fact Sheet for Patients:  StrictlyIdeas.no Fact Sheet for Healthcare Providers: BankingDealers.co.za This test is not yet approved or cleared by the [place] FDA and has been authorized for detection and/or diagnosis of SARS-CoV-2 by FDA under an Emergency Use Authorization (EUA).  This EUA will remain in effect (meaning this test can be used) for the duration of the COVID-19 declaration under Section 564(b)(1) of the Act, 21 U.S.C. section 360bbb-3(b)(1), unless the authorization is terminated or revoked sooner. Performed at [facility], [place] [address], [place], [place] [address]   Blood culture (routine x 2)     Status: None   Collection Time: 11/02/18  8:30 AM   Specimen: BLOOD  Result Value Ref Range Status   Specimen Description    Final    BLOOD BLOOD RIGHT FOREARM Performed at [place] [name] [place], [place] [address]    Special Requests   Final    BOTTLES DRAWN AEROBIC AND ANAEROBIC Blood Culture adequate volume Performed at [facility], [address] [place], [place] [address]    Culture   Final    NO GROWTH 5 DAYS Performed at [facility], [place] [address], [place], [place] [address]    Report Status 11/07/2018 FINAL  Final  Urine culture     Status: None   Collection Time: 11/02/18 12:00 PM   Specimen: Urine, Clean Catch  Result Value Ref Range Status   Specimen Description   Final    URINE, CLEAN CATCH Performed at [facility], [place] [address] [place], [place] [address]    Special Requests   Final    NONE Performed at [facility], [place] [address], [place], [place] [address]    Culture   Final    NO GROWTH Performed at [facility], [place] [address] [place], [place] [address]    Report Status 11/03/2018 FINAL  Final  MRSA PCR Screening     Status: None   Collection Time: 11/02/18 12:05 PM   Specimen: Nasopharyngeal  Result Value Ref Range Status   MRSA by PCR NEGATIVE NEGATIVE Final    Comment:        The GeneXpert MRSA Assay (FDA approved for NASAL specimens only), is one component of a comprehensive MRSA colonization surveillance program. It is not intended to diagnose MRSA infection nor to guide or monitor treatment for MRSA infections. Performed at [facility], [place] [address] [place], [place] [address]   Blood culture (routine x 2)     Status: None   Collection Time: 11/02/18  3:27 PM   Specimen: BLOOD  Result Value Ref Range Status   Specimen Description   Final    BLOOD LEFT ARM Performed at [facility] [address] [place], [place] [address]    Special Requests   Final    BOTTLES DRAWN AEROBIC ONLY Blood Culture adequate volume  Performed at [facility] [address] [place], [place] [address]    Culture   Final    NO GROWTH 5 DAYS Performed at [facility], [place] [address] [place], [place] [address]    Report Status 11/07/2018 FINAL  Final      Studies: No results found.  Scheduled Meds: . apixaban  5 mg Oral BID  . feeding supplement (ENSURE ENLIVE)  237 mL Oral TID BM  . levothyroxine  50 mcg Oral QAC breakfast  . mouth rinse  15 mL Mouth Rinse BID  . metoprolol tartrate  12.5 mg Oral BID  . multivitamin with minerals  1 tablet Oral Daily  . pantoprazole  40 mg Oral BID  . sucralfate  1 g Oral TID WC & HS  . vitamin C  250 mg Oral Daily    Continuous Infusions: . amiodarone 60 mg/hr (11/09/18 0946)  . piperacillin-tazobactam (ZOSYN)  IV 3.375 g (11/09/18 0940)     LOS: 7 days     Kayleen Memos, MD [facility] Pager [phone]  If 7PM-7AM, please contact night-coverage [email or web address] Password [facility] 11/09/2018, 11:37 AM

## 2018-11-09 NOTE — Discharge Summary (Addendum)
Discharge Summary  Antonio Manning HXT:[id number] DOB: 1940-11-05  PCP: Hulan Fess, MD  Admit date: 11/02/2018 Discharge date: 11/09/2018  Time spent: 35 minutes   Recommendations for Outpatient Follow-up:  1. Please call for a close follow-up appointment with your oncologist Dr. Burr Medico. 2. Follow-up with your cardiologist. 3. Follow-up with GI. 4. Follow-up with your PCP. 5. Follow-up with outpatient palliative care team. 6. Take your medications as prescribed. 7. Home health PT as tolerated. 8. Fall precautions.  Discharge Diagnoses:  Active Hospital Problems   Diagnosis Date Noted   Sepsis ([place]) 11/02/2018   Palliative care by specialist    Lactic acidosis 11/02/2018   High anion gap metabolic acidosis 16/55/3748   Symptomatic anemia 11/02/2018   Coffee ground emesis 11/02/2018   Hypothyroidism 11/02/2018   Atrial fibrillation with RVR ([place]) 11/02/2018   Abdominal pain 11/02/2018   Left hip pain 11/02/2018   Hepatocellular carcinoma ([place]) 08/04/2018   PAF (paroxysmal atrial fibrillation) ([place]) 07/06/2018   Liver lesion 07/06/2018   History of CVA (cerebrovascular accident) 10/27/2015   Hyperlipidemia 04/04/2014   Fatigue 10/30/2013   S/P left hip replacement 05/21/2011    Resolved Hospital Problems  No resolved problems to display.    Discharge Condition: Stable  Diet recommendation: Diet recommendations per speech therapist: Diet recommendations: Dysphagia 3 (mechanical soft);Thin liquid Liquids provided via: Cup;Straw Medication Administration: Whole meds with liquid Supervision: Patient able to self feed Compensations: Slow rate;Small sips/bites Postural Changes and/or Swallow Maneuvers: Seated upright 90 degrees;Upright 30-60 min after meal.  Vitals:   11/09/18 0944 11/09/18 1352  BP: 91/72 107/81  Pulse: 94 64  Resp: 19 16  Temp: 97.6 F (36.4 C) 97.7 F (36.5 C)  SpO2: 96% 97%    History of present illness:  78 year old male with  PMH of stage III hepatocellular carcinoma currently on immunotherapy and Avastin, HTN, HLD, hard of hearing, cryptogenic stroke, paroxysmal atrial fibrillation on Eliquis, presented to the ED on 11/02/2018 due to weakness, dyspnea and coffee-ground emesis. In the ED, patient met sepsis criteria including hypothermia, elevated lactate, tachypnea and tachycardia, initiated on broad-spectrum antibiotics. He also had acute blood loss anemia/hemoglobin 6.6 due to acute upper GI bleed. Admitted to stepdown unit for sepsis, acute respiratory failure with hypoxia, elevated lactate, acute upper GI bleed with ABLA and A. fib with RVR. [facility], cardiology and medical oncology consulted. Status post EGD 7/3. Hospital course complicated by acute confusion overnight 7/3, possibly related to medications (Remeron and tramadol), resolved. On 7/6, weak, dyspneic, hypoxic, suspect pulmonary edema.  11/09/18: Patient seen and examined at his bedside.  He has no new complaints. Discussed with Dr. Percival Spanish, cardiology, okay to discharge on Toprol XL 12.5 mg daily. Discussed with oncology, Dr. Burr Medico, okay to discharge from oncology standpoint will follow-up outpatient.   Of note, family has declined SNF and wants the patient to come home.  Case manager consulted to arrange for home health services, DME, and outpatient palliative care.   Spoke with patient's medical POA, his son, Quita Skye via phone on 11/09/2018 and advised to closely follow-up with Dr. Burr Medico and outpatient palliative care.  Hospital Course:  Principal Problem:   Sepsis ([place]) Active Problems:   S/P left hip replacement   Fatigue   Hyperlipidemia   History of CVA (cerebrovascular accident)   PAF (paroxysmal atrial fibrillation) ([facility])   Liver lesion   Hepatocellular carcinoma ([facility])   Lactic acidosis   High anion gap metabolic acidosis   Symptomatic anemia   Coffee ground emesis  Hypothyroidism   Atrial fibrillation with RVR ([facility])   Abdominal pain    Left hip pain   Palliative care by specialist  Resolving sepsis suspect secondary to aspiration pneumonia With a procalcitonin greater than 5 on 11/07/2018 Completed IV Zosyn started on 11/07/2018 >>> 11/09/18 Elevated lactic acid which trended down Blood cultures negative to date Urine culture no growth Speech therapist consulted to assess for dysphagia Recommendations as stated above dysphasia pure diet, mechanical soft and thin liquid. Procalcitonin 1.93 on 11/09/2018 from 5.39 on 11/07/18 Continue Augmentin 1 tablet twice daily x 7 days  Elevated lactic acid Lactic acid peaked at 3.9 and trended down to 1.7 on 11/08/2018 Completed IV Zosyn Not on IV fluid due to systolic heart failure  Paroxysmal A. fib with RVR with hypotension Continue to maintain map greater than 65 Rate is currently controlled Completed IV amiodarone for rhythm control Oral amiodarone 11/09/2018 Cardiology, Dr. Percival Spanish, recommends Toprol xl 12.5 mg daily at discharge Follow-up with cardiology outpatient  Acute on chronic combined systolic and diastolic CHF 2D echo done on 11/03/2018 showed LVEF 45 to 61% with 1 diastolic dysfunction Continue cardiac medications  Pulmonary edema likely cardiogenic Maintain O2 saturation greater than 92% Diuretics on hold due to soft blood pressures  Hypokalemia Potassium 3.4 Repleted Follow-up with your PCP  Hypomagnesemia Magnesium 1.6 Repleted with IV magnesium 4 g once Follow up with your PCP  Elevated transaminases likely secondary to liver cancer LFTs continue to trend down Stable diffuse hepatic lesions are noted consistent with metastatic disease or malignancy. Stable 16 mm aortocaval lymph node. Moderate ascites.  Hyperbilirubinemia in the setting of hepatocellular carcinoma Total bilirubin 2.5  from 1.9 No sign of overt bleeding Hemoglobin stable at 9.1 on 11/09/2018 from 8.7 yesterday -Discussed with Dr. Burr Medico, will follow-up outpatient  Acute upper  GI bleed/severe erosive esophagitis and mild gastritis  He was treated supportively with bowel rest/n.p.o., IV Protonix drip, IV octreotide drip. Interestingly FOBT was negative.  [facility] was consulted and underwent EGD 7/3   Continue twice daily PPI indefinitely and sucralfate for 2 weeks. Outpatient follow-up with GI withbiopsies.  Acute blood loss anemia/symptomatic  Secondary to acute upper GI bleed.  S/p 2 units PRBC and hemoglobin has improved from 6.6 on admission to 8.1 range.   Eliquis which was held due to GI bleed resumed on 7/5. Hemoglobin stable 9.1 on 11/09/2018 from 8.7 yesterday  High anion gap metabolic acidosis  Anion gap 14, bicarb 21  Continue to follow  Hypothyroidism  TSH 29.022, free T4: 0.97. Free T3: 1.1. Currently on Synthroid 50 mcg daily.  ? Abnormal TSH related to acute illness/cancer. Clinically appears euthyroid. Continue current dose of Synthroid, defer to PCP regarding adjusting dose and consider repeating TSH in 4 weeks.Also hesitant to increase his Synthroid dose given issues with sinus tachycardia/A. fib RVR.  Cardiomyopathy  TTE 7/3: LVEF 45-50%. No chest pain reported. Management per cardiology.  Elevated troponin  Likely due to demand ischemia from acute illness discussed above rather than due to ACS.  Denies chest pain  Stage III hepatocellular carcinoma  Oncology input appreciated.   hepatic metastatic lesions and moderate ascites  History of CVA  Continue Eliquis.  Hyperlipidemia  Lipitor held due to elevated LFTs  Resolved abdominal pain  Present on admission. Likely related to esophagitis now seen on EGD. Stable hepatic metastatic lesions and moderate ascites.  Denies abdominal pain  Left hip pain  X-ray without fractures or acute findings.  Resolved acute toxic metabolic  encephalopathy  Patient is alert oriented x4.  Ascending aortic aneurysm Ascending aorta measuring 44 mm on  echo 7/3, chest CT 4/12: 48 mm. Recommend outpatient follow-up.  Goals of care Discussed with Dr. Burr Medico on 11/08/2018 via phone Overall poor prognosis, if no improvement may consider hospice.  Generalized weakness/physical debility/ambulatory dysfunction PT OT assessed and recommended SNF Family declined SNF Fall precautions.     Code Status:Full   Consultants: [facility] Medical Oncology Palliative care team.  Procedures: EGD 3/7  Antimicrobials: IV cefepime and vancomycin 7/2-7/3 IV Unasyn 7/3 > 7/5 Augmentin 7/9 >>     Discharge Exam: BP 107/81 (BP Location: Right Arm)    Pulse 64    Temp 97.7 F (36.5 C) (Oral)    Resp 16    Ht 6' (1.829 m)    Wt 68.5 kg    SpO2 97%    BMI 20.48 kg/m   General: 78 y.o. year-old male well developed well nourished in no acute distress.  Alert and oriented x3.  Hard of hearing.  Cardiovascular: Regular rate and rhythm with no rubs or gallops.  No thyromegaly or JVD noted.    Respiratory: Mild rales at bases with no wheezes.  Poor inspiratory effort.  Abdomen: Soft nontender mildly distended with bowel sounds present.  Musculoskeletal: No lower extremity edema. 2/4 pulses in all 4 extremities.  Psychiatry: Mood is appropriate for condition and setting  Discharge Instructions You were cared for by a hospitalist during your hospital stay. If you have any questions about your discharge medications or the care you received while you were in the hospital after you are discharged, you can call the unit and asked to speak with the hospitalist on call if the hospitalist that took care of you is not available. Once you are discharged, your primary care physician will handle any further medical issues. Please note that NO REFILLS for any discharge medications will be authorized once you are discharged, as it is imperative that you return to your primary care physician (or establish a relationship with a primary care  physician if you do not have one) for your aftercare needs so that they can reassess your need for medications and monitor your lab values.   Allergies as of 11/09/2018      Reactions   Codeine Nausea And Vomiting      Medication List    STOP taking these medications   amLODipine 2.5 MG tablet Commonly known as: NORVASC   atorvastatin 40 MG tablet Commonly known as: LIPITOR   mirtazapine 7.5 MG tablet Commonly known as: REMERON   prochlorperazine 10 MG tablet Commonly known as: COMPAZINE     TAKE these medications   amiodarone 400 MG tablet Commonly known as: PACERONE Take 1 tablet (400 mg total) by mouth 2 (two) times daily. Please take as instructed below only Take 400 mg bid amiodarone for one week then 400 qd x 1 week. Then 200 mg daily as the maintenance dose.   amoxicillin-clavulanate 875-125 MG tablet Commonly known as: AUGMENTIN Take 1 tablet by mouth every 12 (twelve) hours for 7 days.   calcium carbonate 500 MG chewable tablet Commonly known as: TUMS - dosed in mg elemental calcium Chew 1 tablet by mouth 3 (three) times daily as needed for indigestion or heartburn.   CoQ10 100 MG Caps Take 100 mg by mouth daily.   Eliquis 5 MG Tabs tablet Generic drug: apixaban TAKE 1 TABLET BY MOUTH TWICE A DAY   feeding supplement (  ENSURE ENLIVE) Liqd Take 237 mLs by mouth 2 (two) times daily between meals for 10 days.   levothyroxine 50 MCG tablet Commonly known as: SYNTHROID Take 50 mcg by mouth daily before breakfast.   metoprolol succinate 25 MG 24 hr tablet Commonly known as: Toprol XL Take 0.5 tablets (12.5 mg total) by mouth daily.   multivitamin tablet Take 1 tablet by mouth daily.   ondansetron 8 MG tablet Commonly known as: Zofran Take 1 tablet (8 mg total) by mouth 2 (two) times daily as needed (Nausea or vomiting).   pantoprazole 40 MG tablet Commonly known as: PROTONIX Take 1 tablet (40 mg total) by mouth 2 (two) times daily.   sucralfate 1  GM/10ML suspension Commonly known as: CARAFATE Take 10 mLs (1 g total) by mouth 4 (four) times daily -  with meals and at bedtime.   vitamin C 250 MG tablet Commonly known as: ASCORBIC ACID Take 250 mg by mouth daily.            Durable Medical Equipment  (From admission, onward)         Start     Ordered   11/09/18 0453  For home use only DME oxygen  Once    Question Answer Comment  Length of Need 6 Months   Mode or (Route) Nasal cannula   Liters per Minute 2   Frequency Continuous (stationary and portable oxygen unit needed)   Oxygen conserving device Yes   Oxygen delivery system Gas      11/09/18 0452   11/08/18 1638  For home use only DME Pulse oximeter  Once     11/08/18 1638         Allergies  Allergen Reactions   Codeine Nausea And Vomiting   Follow-up Information    [facility] Follow up.   Specialty: Home Health Services Why: [facility]       [name], MD. Call in 1 day(s).   Specialties: Hematology, Oncology Why: Please call within a week for a close follow-up appointment. Contact information: [place] [address] [phone]        Minus Breeding, MD. Call in 1 day(s).   Specialty: Cardiology Why: Please call for a post hospital follow-up appointment. Contact information: [place] [address] [phone]            The results of significant diagnostics from this hospitalization (including imaging, microbiology, ancillary and laboratory) are listed below for reference.    Significant Diagnostic Studies: X-ray Chest Pa And Lateral  Result Date: 11/03/2018 CLINICAL DATA:  Sepsis EXAM: CHEST - 2 VIEW COMPARISON:  11/02/2018 FINDINGS: Similar cardiomegaly with low lung volumes and basilar densities, suspect atelectasis. On the lateral view, there are small posterior layering effusions. No definite edema pattern or CHF. No pneumothorax. Trachea  midline. Aorta atherosclerotic. Degenerative changes of the spine. Diffuse osteopenia noted. IMPRESSION: Mild cardiomegaly without CHF Slight increased bibasilar atelectasis and small posterior layering effusions. Electronically Signed   By: Jerilynn Mages.  Shick M.D.   On: 11/03/2018 13:46   Ct Abdomen Pelvis W Contrast  Result Date: 11/02/2018 CLINICAL DATA:  Vomiting.  Abdominal pain. EXAM: CT ABDOMEN AND PELVIS WITH CONTRAST TECHNIQUE: Multidetector CT imaging of the abdomen and pelvis was performed using the standard protocol following bolus administration of intravenous contrast. CONTRAST:  177m OMNIPAQUE IOHEXOL 300 MG/ML  SOLN COMPARISON:  CT scan of October 16, 2018. FINDINGS: Lower chest: Small right pleural effusion  is noted with adjacent right lower lobe atelectasis or pneumonia. Hepatobiliary: Innumerable malignant or metastatic lesions are seen involving both hepatic lobes, but most predominantly the left hepatic lobe and anterior segment of right hepatic lobe. Index lesion is noted anterior portion of right hepatic lobe that measures 8.7 x 8.8 cm, which is not significantly changed compared to prior exam. No biliary dilatation or cholelithiasis is noted. Pancreas: Unremarkable. No pancreatic ductal dilatation or surrounding inflammatory changes. Spleen: Normal in size without focal abnormality. Adrenals/Urinary Tract: Adrenal glands are unremarkable. Kidneys are normal, without renal calculi, focal lesion, or hydronephrosis. Bladder is unremarkable. Stomach/Bowel: Stomach is within normal limits. Appendix appears normal. No evidence of bowel wall thickening, distention, or inflammatory changes. Vascular/Lymphatic: Aortic atherosclerosis. 16 mm aortocaval lymph node is noted which is not significantly changed compared to prior exam. Reproductive: Prostate is unremarkable. Other: Moderate ascites is noted.  No hernia is noted. Musculoskeletal: No acute or significant osseous findings. IMPRESSION: Small right  pleural effusion is noted with adjacent right lower lobe atelectasis or pneumonia. Stable diffuse hepatic lesions are noted consistent with metastatic disease or malignancy. Stable 16 mm aortocaval lymph node. Moderate ascites. Aortic Atherosclerosis (ICD10-I70.0). Electronically Signed   By: Marijo Conception M.D.   On: 11/02/2018 19:32   Ct Abdomen Pelvis W Contrast  Result Date: 10/16/2018 CLINICAL DATA:  Hepatocellular carcinoma. EXAM: CT ABDOMEN AND PELVIS WITH CONTRAST TECHNIQUE: Multidetector CT imaging of the abdomen and pelvis was performed using the standard protocol following bolus administration of intravenous contrast. CONTRAST:  176m OMNIPAQUE IOHEXOL 300 MG/ML  SOLN COMPARISON:  07/03/2018 FINDINGS: Lower chest: Small right pleural effusion is increased in volume from previous exam. Hepatobiliary: Innumerable liver lesions are again identified. There is complete replacement of the left lobe of liver with scattered lesions noted in the right lobe. The index lesion within segment 4 measures 8.9 by 8.7 cm, image 45/7. Previously 8.9 x 8.1 cm. The anterior subcapsular lesion arising from segment 4 B measures 5.1 by 4.5 cm, image 56/7. Previously 4.6 x 3.9 cm. Index lesion within the subcapsular aspect of segment 6 measures 3.5 by 3.3 cm, image 59/7. Previously 2.9 x 2.8 cm. Gallbladder unremarkable. Pancreas: Unremarkable. No pancreatic ductal dilatation or surrounding inflammatory changes. Spleen: Normal in size without focal abnormality. Adrenals/Urinary Tract: Normal appearance of the adrenal glands. Previously demonstrated enhancing left kidney lesion measures 1.5 by 1.6 cm. Previously 1.6 x 1.9 cm. Urinary bladder is obscured by artifact from hip arthroplasty devices. Stomach/Bowel: Stomach is within normal limits. Appendix appears normal. No evidence of bowel wall thickening, distention, or inflammatory changes. Vascular/Lymphatic: Aortic atherosclerosis and infrarenal abdominal aortic ectasia  which measures 2.9 cm. Tumor in left lobe of liver has significant mass effect upon the extrahepatic portal vein and left branch of portal vein without definite evidence for thrombosis. There is also marked mass effect upon the intrahepatic IVC. Aortocaval node measures 1.9 cm, image 31/7. Previously 1.2 cm. No pelvic or inguinal adenopathy. Reproductive: Prostate is unremarkable. Other: Moderate volume of ascites, increased from previous exam. Musculoskeletal: Previous left hip arthroplasty. No acute or suspicious bone lesions. Scoliosis. IMPRESSION: 1. Extensive tumor burden identified within both lobes of liver compatible with multifocal hepatoma. When compared with previous exam index lesions demonstrate mild increase in size from previous study. 2. Increase in volume of ascites. 3. Aortocaval  lymph node adenopathy increased in the interval. 4. Increase in volume of right pleural effusion. 5.  Aortic Atherosclerosis (ICD10-I70.0). 6. Ectatic abdominal aorta at risk for aneurysm  development. Recommend followup by ultrasound in 5 years. This recommendation follows ACR consensus guidelines: White Paper of the ACR Incidental Findings Committee II on Vascular Findings. J Am Coll Radiol 2013; 10:789-794. Electronically Signed   By: Kerby Moors M.D.   On: 10/16/2018 16:56   Dg Chest Port 1 View  Result Date: 11/06/2018 CLINICAL DATA:  Shortness of breath EXAM: PORTABLE CHEST 1 VIEW COMPARISON:  11/03/2018 FINDINGS: Cardiomegaly. Aortic atherosclerosis. Enlargement of bilateral effusions with lower lobe atelectasis. Loop recorder in place. Upper lungs are clear. IMPRESSION: Worsened congestive heart failure with enlarging effusions and lower lobe atelectasis. Electronically Signed   By: Nelson Chimes M.D.   On: 11/06/2018 12:38   Dg Chest Portable 1 View  Result Date: 11/02/2018 CLINICAL DATA:  Diarrhea, weakness, history of liver cancer EXAM: PORTABLE CHEST 1 VIEW COMPARISON:  08/11/2018 FINDINGS: Trace right  pleural effusion. Mild right basilar atelectasis. No pneumothorax. Stable cardiomediastinal silhouette. No aggressive osseous lesion. IMPRESSION: Trace right pleural effusion with atelectasis. Electronically Signed   By: Kathreen Devoid   On: 11/02/2018 10:17   Dg Hip Unilat With Pelvis 1v Left  Result Date: 11/02/2018 CLINICAL DATA:  Left hip replacement EXAM: DG HIP (WITH OR WITHOUT PELVIS) 1V*L* COMPARISON:  No comparison. FINDINGS: Bones are diffusely demineralized. Patient is status post left total hip arthroplasty. Degenerative changes noted in the right hip. IMPRESSION: No acute findings. Electronically Signed   By: Misty Stanley M.D.   On: 11/02/2018 18:51    Microbiology: Recent Results (from the past 240 hour(s))  SARS Coronavirus 2 (CEPHEID- Performed in [place] hospital lab), Hosp Order     Status: None   Collection Time: 11/02/18  8:27 AM   Specimen: Nasopharyngeal Swab  Result Value Ref Range Status   SARS Coronavirus 2 NEGATIVE NEGATIVE Final    Comment: (NOTE) If result is NEGATIVE SARS-CoV-2 target nucleic acids are NOT DETECTED. The SARS-CoV-2 RNA is generally detectable in upper and lower  respiratory specimens during the acute phase of infection. The lowest  concentration of SARS-CoV-2 viral copies this assay can detect is 250  copies / mL. A negative result does not preclude SARS-CoV-2 infection  and should not be used as the sole basis for treatment or other  patient management decisions.  A negative result may occur with  improper specimen collection / handling, submission of specimen other  than nasopharyngeal swab, presence of viral mutation(s) within the  areas targeted by this assay, and inadequate number of viral copies  (<250 copies / mL). A negative result must be combined with clinical  observations, patient history, and epidemiological information. If result is POSITIVE SARS-CoV-2 target nucleic acids are DETECTED. The SARS-CoV-2 RNA is generally  detectable in upper and lower  respiratory specimens dur ing the acute phase of infection.  Positive  results are indicative of active infection with SARS-CoV-2.  Clinical  correlation with patient history and other diagnostic information is  necessary to determine patient infection status.  Positive results do  not rule out bacterial infection or co-infection with other viruses. If result is PRESUMPTIVE POSTIVE SARS-CoV-2 nucleic acids MAY BE PRESENT.   A presumptive positive result was obtained on the submitted specimen  and confirmed on repeat testing.  While 2019 novel coronavirus  (SARS-CoV-2) nucleic acids may be present in the submitted sample  additional confirmatory testing may be necessary for epidemiological  and / or clinical management purposes  to differentiate between  SARS-CoV-2 and other Sarbecovirus currently known to infect humans.  If clinically indicated additional testing with an alternate test  methodology [phone]) is advised. The SARS-CoV-2 RNA is generally  detectable in upper and lower respiratory sp ecimens during the acute  phase of infection. The expected result is Negative. Fact Sheet for Patients:  StrictlyIdeas.no Fact Sheet for Healthcare Providers: BankingDealers.co.za This test is not yet approved or cleared by the [place] FDA and has been authorized for detection and/or diagnosis of SARS-CoV-2 by FDA under an Emergency Use Authorization (EUA).  This EUA will remain in effect (meaning this test can be used) for the duration of the COVID-19 declaration under Section 564(b)(1) of the Act, 21 U.S.C. section 360bbb-3(b)(1), unless the authorization is terminated or revoked sooner. Performed at [facility], [place] [address], [place], [place] [address]   Blood culture (routine x 2)     Status: None   Collection Time: 11/02/18  8:30 AM   Specimen: BLOOD  Result Value Ref Range  Status   Specimen Description   Final    BLOOD BLOOD RIGHT FOREARM Performed at [facility] [address], [place], [place] [address]    Special Requests   Final    BOTTLES DRAWN AEROBIC AND ANAEROBIC Blood Culture adequate volume Performed at [facility] [address], [place], [place] [address]    Culture   Final    NO GROWTH 5 DAYS Performed at [facility], [place] [address], [place], [place] [address]    Report Status 11/07/2018 FINAL  Final  Urine culture     Status: None   Collection Time: 11/02/18 12:00 PM   Specimen: Urine, Clean Catch  Result Value Ref Range Status   Specimen Description   Final    URINE, CLEAN CATCH Performed at [facility], [place] [address], [place], [place] [address]    Special Requests   Final    NONE Performed at [facility], [place] [address], [place], [place] [address]    Culture   Final    NO GROWTH Performed at [facility], [place] [address], [place], [place] [address]    Report Status 11/03/2018 FINAL  Final  MRSA PCR Screening     Status: None   Collection Time: 11/02/18 12:05 PM   Specimen: Nasopharyngeal  Result Value Ref Range Status   MRSA by PCR NEGATIVE NEGATIVE Final    Comment:        The GeneXpert MRSA Assay (FDA approved for NASAL specimens only), is one component of a comprehensive MRSA colonization surveillance program. It is not intended to diagnose MRSA infection nor to guide or monitor treatment for MRSA infections. Performed at [facility], [place] [address] [place], [place] [address]   Blood culture (routine x 2)     Status: None   Collection Time: 11/02/18  3:27 PM   Specimen: BLOOD  Result Value Ref Range Status   Specimen Description   Final    BLOOD LEFT ARM Performed at [facility] [address] [place], [place] [address]    Special Requests   Final    BOTTLES DRAWN AEROBIC  ONLY Blood Culture adequate volume Performed at [facility] [address] [place], [place] [address]    Culture   Final    NO GROWTH 5 DAYS Performed at [facility], [place] [address] [place],  [address]    Report Status 11/07/2018 FINAL  Final  Labs: Basic Metabolic Panel: Recent Labs  Lab 11/02/18 1527  11/04/18 0709 11/05/18 0544 11/06/18 0520 11/07/18 0504 11/08/18 0525 11/09/18 0532  NA 141   < > 138 142 141 139 139 138  K 4.1   < > 2.7* 3.9 3.6 3.0* 3.1* 3.4*  CL 102   < > 101 107 106 105 106 103  CO2 21*   < > _0 21*  GLUCOSE 120*   < > 79 87 95 105* 109* 105*  BUN 23   < > _1 CREATININE 0.88   < > 0.52* 0.54* 0.43* 0.44* 0.48* 0.49*  CALCIUM 8.6*   < > 7.9* 8.3* 8.4* 8.3* 8.6* 8.4*  MG 1.5*  --  2.1  --   --   --   --  1.6*   < > = values in this interval not displayed.   Liver Function Tests: Recent Labs  Lab 11/04/18 0709 11/05/18 0544 11/06/18 0520 11/07/18 0504 11/08/18 0525  AST 679* 291* 126* 68* 34  ALT 139* 89* 58* 41 22  ALKPHOS 717* 572* 434* 350* 193*  BILITOT 1.6* 1.7* 1.6* 1.9* 2.5*  PROT 5.7* 5.9* 5.6* 5.7* 5.7*  ALBUMIN 3.5 3.3* 3.0* 3.0* 3.8   No results for input(s): LIPASE, AMYLASE in the last 168 hours. Recent Labs  Lab 11/04/18 1652  AMMONIA <9*   CBC: Recent Labs  Lab 11/02/18 1614  11/05/18 0544 11/06/18 0520 11/07/18 0504 11/08/18 0525 11/09/18 0532  WBC 10.1   < > 5.9 7.9 9.7 10.3 10.9*  NEUTROABS 8.4*  --   --   --   --  8.2* 8.8*  HGB 7.4*   < > 7.4* 8.1* 9.0* 8.7* 9.1*  HCT 23.7*   < > 23.1* 25.8* 28.0* 27.5* 30.3*  MCV 100.0   < > 97.5 98.1 95.9 98.2 103.4*  PLT 249   < > 165 198 247 300 330   < > = values in this interval not displayed.   Cardiac Enzymes: No results for input(s): CKTOTAL, CKMB, CKMBINDEX, TROPONINI in the last 168 hours. BNP: BNP (last 3 results) No results for input(s): BNP in the last 8760 hours.  ProBNP (last 3  results) No results for input(s): PROBNP in the last 8760 hours.  CBG: Recent Labs  Lab 11/03/18 0739  GLUCAP 86       Signed:  Kayleen Memos, MD [facility] 11/09/2018, 2:01 PM

## 2018-11-09 NOTE — Progress Notes (Signed)
Progress Note  Patient Name: Antonio Manning Date of Encounter: 11/09/2018  Primary Cardiologist:   Sanda Klein, MD   Subjective   He says that he feels well. He does not notice that his heart rate is elevated.  Denies pain or presyncope  Inpatient Medications    Scheduled Meds: . apixaban  5 mg Oral BID  . feeding supplement (ENSURE ENLIVE)  237 mL Oral TID BM  . levothyroxine  50 mcg Oral QAC breakfast  . mouth rinse  15 mL Mouth Rinse BID  . metoprolol tartrate  12.5 mg Oral BID  . multivitamin with minerals  1 tablet Oral Daily  . pantoprazole  40 mg Oral BID  . potassium chloride  40 mEq Oral Once  . sucralfate  1 g Oral TID WC & HS  . vitamin C  250 mg Oral Daily   Continuous Infusions: . amiodarone 60 mg/hr (11/09/18 0946)  . magnesium sulfate bolus IVPB    . piperacillin-tazobactam (ZOSYN)  IV 3.375 g (11/09/18 0940)   PRN Meds: acetaminophen **OR** acetaminophen, albuterol, ondansetron **OR** ondansetron (ZOFRAN) IV   Vital Signs    Vitals:   11/09/18 0143 11/09/18 0548 11/09/18 0935 11/09/18 0944  BP: 118/88 (!) 128/95 99/70 91/72   Pulse: 65 82 96 94  Resp: 14 16  19   Temp: 97.8 F (36.6 C) 97.8 F (36.6 C)  97.6 F (36.4 C)  TempSrc: Oral Oral  Oral  SpO2: 96% 96%  96%  Weight:      Height:        Intake/Output Summary (Last 24 hours) at 11/09/2018 1225 Last data filed at 11/09/2018 0900 Gross per 24 hour  Intake 684.05 ml  Output 450 ml  Net 234.05 ml   Filed Weights   11/04/18 0500 11/05/18 0500 11/07/18 0606  Weight: 73.3 kg 75.6 kg 68.5 kg    Telemetry    Atrial fib with controlled rate - Personally Reviewed  ECG    NA - Personally Reviewed  Physical Exam   GEN: No acute distress.   Neck: No  JVD Cardiac: IrregularRR, no murmurs, rubs, or gallops.  Respiratory: Clear  to auscultation bilaterally. GI: Soft, nontender, non-distended  MS: No  edema; No deformity. Neuro:  Nonfocal  Psych: Normal affect   Labs    Chemistry  Recent Labs  Lab 11/06/18 0520 11/07/18 0504 11/08/18 0525 11/09/18 0532  NA 141 139 139 138  K 3.6 3.0* 3.1* 3.4*  CL 106 105 106 103  CO2 26 24 22  21*  GLUCOSE 95 105* 109* 105*  BUN 21 22 23 22   CREATININE 0.43* 0.44* 0.48* 0.49*  CALCIUM 8.4* 8.3* 8.6* 8.4*  PROT 5.6* 5.7* 5.7*  --   ALBUMIN 3.0* 3.0* 3.8  --   AST 126* 68* 34  --   ALT 58* 41 22  --   ALKPHOS 434* 350* 193*  --   BILITOT 1.6* 1.9* 2.5*  --   GFRNONAA >60 >60 >60 >60  GFRAA >60 >60 >60 >60  ANIONGAP 9 10 11 14      Hematology Recent Labs  Lab 11/07/18 0504 11/08/18 0525 11/09/18 0532  WBC 9.7 10.3 10.9*  RBC 2.92* 2.80* 2.93*  HGB 9.0* 8.7* 9.1*  HCT 28.0* 27.5* 30.3*  MCV 95.9 98.2 103.4*  MCH 30.8 31.1 31.1  MCHC 32.1 31.6 30.0  RDW 17.0* 17.3* 19.0*  PLT 247 300 330    Cardiac EnzymesNo results for input(s): TROPONINI in the last 168 hours. No results  for input(s): TROPIPOC in the last 168 hours.   BNPNo results for input(s): BNP, PROBNP in the last 168 hours.   DDimer No results for input(s): DDIMER in the last 168 hours.   Radiology    No results found.  Cardiac Studies   Echocardiogram 11/03/2018  1. The left ventricle has mildly reduced systolic function, with an ejection fraction of 45-50%. The cavity size was normal. There is mildly increased left ventricular wall thickness. Left ventricular diastolic Doppler parameters are consistent with  impaired relaxation. Indeterminate filling pressures The E/e' is 8-15. There is abnormal septal motion consistent with left bundle branch block. 2. The right ventricle has normal systolic function. The cavity was normal. There is no increase in right ventricular wall thickness. 3. The mitral valve is abnormal. Mild thickening of the mitral valve leaflet. Mitral valve regurgitation is mild to moderate by color flow Doppler. 4. The tricuspid valve is grossly normal. 5. The aortic valve is tricuspid. Mild sclerosis of the aortic valve.  Aortic valve regurgitation is trivial by color flow Doppler. No stenosis of the aortic valve. 6. There is mild to moderate dilatation of the ascending aorta measuring 44 mm. 7. The average left ventricular global longitudinal strain is -12.0 %.  Patient Profile     78 y.o. male with a hx of cryptogenic stroke and subsequent diagnosis of atrial fibrillation with low burden by ILR (Dr. Sallyanne Kuster), ETT negative for ischemia (2019), and chronic diastolic heart failurenow admitted with acute GIB and PNA, with a fib RVR.   Assessment & Plan    ATRIAL FIB WITH RVR:  I spoke with Dr. Burr Medico and yesterday with Dr. Nevada Crane.  This is not the primary issue.  His rapid rate is secondary to his overall decline and his cancer and failure to thrive.  He is not a candidate for rhythm control.  (ie Not a candidate for cardioversion.)  Rate control is difficult secondary to low BPs.  However, he is not particularly symptomatic with rapid rate.  Dr. Burr Medico is discussing discharge with hospice care and has spoken with the family and apparently there is to be further family/patient discussion.  In anticipation of him leaving the hospital soon I will change to PO amiodarone.  Beta blocker is held because of low BPs.  Change to 400 mg bid amiodarone for one week then 400 qd x 1 week.  Would try to reduce to 200 mg daily as the maintenance dose.   If he is to pursue Hospice care it would be reasonable to discontinue the Eliquis.       For questions or updates, please contact [place] Please consult [email or web address] for contact info under Cardiology/STEMI.   Signed, Minus Breeding, MD  11/09/2018, 12:25 PM

## 2018-11-09 NOTE — Progress Notes (Signed)
SATURATION QUALIFICATIONS: (This note is used to comply with regulatory documentation for home oxygen)  Patient Saturations on Room Air at Rest = 87%  Patient Saturations on Room Air while Ambulating = N/A  Patient Saturations on N/A Liters of oxygen while Ambulating = N/A  Please briefly explain why patient needs home oxygen: Patient needs oxygen at home, his oxygen level drops under 88% while at rest. Needs 2L to keep him in the low to mid 90's.

## 2018-11-09 NOTE — Progress Notes (Signed)
Antonio Manning   DOB:1940-08-12   VG#:[id number]   AJH#:[id number]  Oncology follow up  Subjective: Pt feels better today, when I saw him around noon, he states "the best day since he came in to the hospital".  His blood pressure was borderline low this morning, Toprol was held.   Objective:  Vitals:   11/09/18 0944 11/09/18 1352  BP: 91/72 107/81  Pulse: 94 64  Resp: 19 16  Temp: 97.6 F (36.4 C) 97.7 F (36.5 C)  SpO2: 96% 97%    Body mass index is 20.48 kg/m.  Intake/Output Summary (Last 24 hours) at 11/09/2018 1756 Last data filed at 11/09/2018 1555 Gross per 24 hour  Intake 957.37 ml  Output 250 ml  Net 707.37 ml     Sclerae unicteric  Oropharynx clear  No peripheral adenopathy  Lungs clear -- no rales or rhonchi  Heart regular rate and rhythm  Abdomen distended, mild tenderness  Neuro nonfocal    CBG (last 3)  No results for input(s): GLUCAP in the last 72 hours.   Labs:  Urine Studies No results for input(s): UHGB, CRYS in the last 72 hours.  Invalid input(s): UACOL, UAPR, USPG, UPH, UTP, UGL, [place], UBIL, UNIT, UROB, [place], UEPI, UWBC, Duwayne Heck [place], [place]  Basic Metabolic Panel: Recent Labs  Lab 11/04/18 [phone] 11/05/18 0544 11/06/18 0520 11/07/18 0504 11/08/18 0525 11/09/18 0532  NA 138 142 141 139 139 138  K 2.7* 3.9 3.6 3.0* 3.1* 3.4*  CL 101 107 106 105 106 103  CO2 _0 21*  GLUCOSE 79 87 95 105* 109* 105*  BUN _1 CREATININE 0.52* 0.54* 0.43* 0.44* 0.48* 0.49*  CALCIUM 7.9* 8.3* 8.4* 8.3* 8.6* 8.4*  MG 2.1  --   --   --   --  1.6*   GFR Estimated Creatinine Clearance: 71.4 mL/min (A) (by C-G formula based on SCr of 0.49 mg/dL (L)). Liver Function Tests: Recent Labs  Lab 11/04/18 0709 11/05/18 0544 11/06/18 0520 11/07/18 0504 11/08/18 0525  AST 679* 291* 126* 68* 34  ALT 139* 89* 58* 41 22  ALKPHOS 717* 572* 434* 350* 193*  BILITOT 1.6* 1.7* 1.6* 1.9* 2.5*  PROT 5.7* 5.9* 5.6* 5.7* 5.7*  ALBUMIN 3.5  3.3* 3.0* 3.0* 3.8   No results for input(s): LIPASE, AMYLASE in the last 168 hours. Recent Labs  Lab 11/04/18 1652  AMMONIA <9*   Coagulation profile No results for input(s): INR, PROTIME in the last 168 hours.  CBC: Recent Labs  Lab 11/05/18 0544 11/06/18 0520 11/07/18 0504 11/08/18 0525 11/09/18 0532  WBC 5.9 7.9 9.7 10.3 10.9*  NEUTROABS  --   --   --  8.2* 8.8*  HGB 7.4* 8.1* 9.0* 8.7* 9.1*  HCT 23.1* 25.8* 28.0* 27.5* 30.3*  MCV 97.5 98.1 95.9 98.2 103.4*  PLT 165 198 247 300 330   Cardiac Enzymes: No results for input(s): CKTOTAL, CKMB, CKMBINDEX, TROPONINI in the last 168 hours. BNP: Invalid input(s): POCBNP CBG: Recent Labs  Lab 11/03/18 0739  GLUCAP 86   D-Dimer No results for input(s): DDIMER in the last 72 hours. Hgb A1c No results for input(s): HGBA1C in the last 72 hours. Lipid Profile No results for input(s): CHOL, HDL, LDLCALC, TRIG, CHOLHDL, LDLDIRECT in the last 72 hours. Thyroid function studies No results for input(s): TSH, T4TOTAL, T3FREE, THYROIDAB in the last 72 hours.  Invalid input(s): FREET3 Anemia work up No results for input(s): VITAMINB12,  FOLATE, FERRITIN, TIBC, IRON, RETICCTPCT in the last 72 hours. Microbiology Recent Results (from the past 240 hour(s))  SARS Coronavirus 2 (CEPHEID- Performed in [place] hospital lab), Hosp Order     Status: None   Collection Time: 11/02/18  8:27 AM   Specimen: Nasopharyngeal Swab  Result Value Ref Range Status   SARS Coronavirus 2 NEGATIVE NEGATIVE Final    Comment: (NOTE) If result is NEGATIVE SARS-CoV-2 target nucleic acids are NOT DETECTED. The SARS-CoV-2 RNA is generally detectable in upper and lower  respiratory specimens during the acute phase of infection. The lowest  concentration of SARS-CoV-2 viral copies this assay can detect is 250  copies / mL. A negative result does not preclude SARS-CoV-2 infection  and should not be used as the sole basis for treatment or other  patient  management decisions.  A negative result may occur with  improper specimen collection / handling, submission of specimen other  than nasopharyngeal swab, presence of viral mutation(s) within the  areas targeted by this assay, and inadequate number of viral copies  (<250 copies / mL). A negative result must be combined with clinical  observations, patient history, and epidemiological information. If result is POSITIVE SARS-CoV-2 target nucleic acids are DETECTED. The SARS-CoV-2 RNA is generally detectable in upper and lower  respiratory specimens dur ing the acute phase of infection.  Positive  results are indicative of active infection with SARS-CoV-2.  Clinical  correlation with patient history and other diagnostic information is  necessary to determine patient infection status.  Positive results do  not rule out bacterial infection or co-infection with other viruses. If result is PRESUMPTIVE POSTIVE SARS-CoV-2 nucleic acids MAY BE PRESENT.   A presumptive positive result was obtained on the submitted specimen  and confirmed on repeat testing.  While 2019 novel coronavirus  (SARS-CoV-2) nucleic acids may be present in the submitted sample  additional confirmatory testing may be necessary for epidemiological  and / or clinical management purposes  to differentiate between  SARS-CoV-2 and other Sarbecovirus currently known to infect humans.  If clinically indicated additional testing with an alternate test  methodology [phone]) is advised. The SARS-CoV-2 RNA is generally  detectable in upper and lower respiratory sp ecimens during the acute  phase of infection. The expected result is Negative. Fact Sheet for Patients:  StrictlyIdeas.no Fact Sheet for Healthcare Providers: BankingDealers.co.za This test is not yet approved or cleared by the [place] FDA and has been authorized for detection and/or diagnosis of SARS-CoV-2 by FDA under  an Emergency Use Authorization (EUA).  This EUA will remain in effect (meaning this test can be used) for the duration of the COVID-19 declaration under Section 564(b)(1) of the Act, 21 U.S.C. section 360bbb-3(b)(1), unless the authorization is terminated or revoked sooner. Performed at [facility], [place] [address], [place], [place] [address]   Blood culture (routine x 2)     Status: None   Collection Time: 11/02/18  8:30 AM   Specimen: BLOOD  Result Value Ref Range Status   Specimen Description   Final    BLOOD BLOOD RIGHT FOREARM Performed at [facility] [address] [place], [place] [address]    Special Requests   Final    BOTTLES DRAWN AEROBIC AND ANAEROBIC Blood Culture adequate volume Performed at [facility] [address] [place], [place] [address]    Culture   Final    NO GROWTH 5 DAYS Performed at [facility],  [address], [place], [place] [address]    Report Status 11/07/2018 FINAL  Final  Urine culture     Status: None   Collection Time: 11/02/18 12:00 PM   Specimen: Urine, Clean Catch  Result Value Ref Range Status   Specimen Description   Final    URINE, CLEAN CATCH Performed at [facility], [place] [address], [place], [place] [address]    Special Requests   Final    NONE Performed at [facility], [place] [address] [place], [place] [address]    Culture   Final    NO GROWTH Performed at [facility], [place] [address] [place], [place] [address]    Report Status 11/03/2018 FINAL  Final  MRSA PCR Screening     Status: None   Collection Time: 11/02/18 12:05 PM   Specimen: Nasopharyngeal  Result Value Ref Range Status   MRSA by PCR NEGATIVE NEGATIVE Final    Comment:        The GeneXpert MRSA Assay (FDA approved for NASAL specimens only), is one component of a comprehensive MRSA colonization surveillance program. It is not intended to diagnose  MRSA infection nor to guide or monitor treatment for MRSA infections. Performed at [facility], [place] [address] [place], [place] [address]   Blood culture (routine x 2)     Status: None   Collection Time: 11/02/18  3:27 PM   Specimen: BLOOD  Result Value Ref Range Status   Specimen Description   Final    BLOOD LEFT ARM Performed at [facility] [address] [place], [place] [address]    Special Requests   Final    BOTTLES DRAWN AEROBIC ONLY Blood Culture adequate volume Performed at [facility] [address] [place], [place] [address]    Culture   Final    NO GROWTH 5 DAYS Performed at [facility], [place] [address] [place], [place] [address]    Report Status 11/07/2018 FINAL  Final      Studies:  No results found.  Assessment: 78 y.o. male with stage III hepatocellular carcinoma  1. Sepsis with severe lactic acidosis secondary to aspiration pneumonia  2.  Acute respiratory failure with hypoxia, secondary to #1, on [place] oxygen  3.  Symptomatic anemia from upper GI bleeding, s/p blood transfusion  4. Stage III hepatocellular carcinoma, currently on atezoliaumab (immunotherapy) and Avastin (EGFR inhibitor), last dose 6/24 5. AF with RVR 6. History of CVA 7. Left hip pain, history of left hip replacement  8. Anorexia  9. Deconditioning  10.  Hyperbilirubinemia, worsening, probably related to his liver cancer  Plan:  -I again discussed the overall poor prognosis with patient in detail today, and likely is not able to continue with his cancer treatment due to his poor performance status.   Patient also asked about how much time he has left, my best estimate is probably a few months, or shorter.  -we discuss the code status, I recommend DNR, he will think about it. I spoke with cardiologist Dr. Percival Spanish. He changed iv amiodarone to oral, and will continue low-dose Toprol shallowness.  He feels he is driven by  his underlying cancer and infection, no need to control his hear rate tightly  -I spoke with Dr. Nevada Crane  -Pt and his family want him to go home, Dr. Nevada Crane will arrange discharge today  -[name] came to floor arround 5:30pm today, and I  updated him the current condition, and plan after discharge.  We discussed the possibility to transition him to hospice at home if he does not recover well.  Family has discussed about hospice but has not decided yet. He voiced good understanding, knows to call my office if needed.   I spent a total of 60-81mns in coordinating his care today, >50% of face-to-face counseling.  YTruitt Merle MD 11/09/2018

## 2018-11-09 NOTE — Consult Note (Signed)
Consultation Note Date: 11/09/2018   Patient Name: Antonio Manning  DOB: [birth year]  MRN: [id number]  Age / Sex: 78 y.o., male  PCP: Hulan Fess, MD Referring Physician: Kayleen Memos, DO  Reason for Consultation: Establishing goals of care  HPI/Patient Profile: 78 y.o. male   admitted on 11/02/2018   78 year old male with PMH of stage III hepatocellular carcinoma currently on immunotherapy and Avastin, HTN, HLD, hard of hearing, cryptogenic stroke, paroxysmal atrial fibrillation on Eliquis, presented to the ED on 11/02/2018 due to weakness, dyspnea and coffee-ground emesis.  Clinical Assessment and Goals of Care: Hospital course: Admitted to stepdown unit for sepsis, acute respiratory failure with hypoxia, elevated lactate, acute upper GI bleed with ABLA and A. fib with RVR. [facility], cardiology and medical oncology consulted. Status post EGD 7/3. Hospital course complicated by acute confusion overnight 7/3, possibly related to medications (Remeron and tramadol), resolved. A palliative consult has been requested for ongoing goals of care discussions.   Patient is awake alert, resting in bed. He states that he finally slept well last night, states that he has spent too many days at the hospital. He is looking forward to going home. He denies chest pain, denies shortness of breath. He is in no distress.   Palliative medicine is specialized medical care for people living with serious illness. It focuses on providing relief from the symptoms and stress of a serious illness. The goal is to improve quality of life for both the patient and the family.  Goals of care: Broad aims of medical therapy in relation to the patient's values and preferences. Our aim is to provide medical care aimed at enabling patients to achieve the goals that matter most to them, given the circumstances of their particular medical situation and  their constraints.   Discussed with patient about his current health, his symptoms, his current home situation and about his current medications and PO intake.   See below.   NEXT OF KIN  wife and son.   SUMMARY OF RECOMMENDATIONS    full code full scope Recommend home with home health, home based RN, PT. Also recommend out patient palliative care.  Discussed with [facility] MD.  Recommend out patient follow up with med onc and cardiology as outpatient.   Code Status/Advance Care Planning:  Full code    Symptom Management:      Palliative Prophylaxis:   Delirium Protocol  Additional Recommendations (Limitations, Scope, Preferences):  Full Scope Treatment  Psycho-social/Spiritual:   Desire for further Chaplaincy support:yes  Additional Recommendations: Caregiving  Support/Resources  Prognosis:   Unable to determine  Discharge Planning: Home with Home Health      Primary Diagnoses: Present on Admission: . Sepsis ([place]) . PAF (paroxysmal atrial fibrillation) ([place]) . Liver lesion . Hyperlipidemia . Hepatocellular carcinoma ([place]) . Fatigue   I have reviewed the medical record, interviewed the patient and family, and examined the patient. The following aspects are pertinent.  Past Medical History:  Diagnosis Date  . Arthritis 05-13-11   osteoarthritis,lt.  hip, and neck area  . Cancer ([place]) 05-27-2011   skin cancer lesions-multiple  . Cataract 2011-05-27   right  . Cholesterol serum elevated 2011-05-27   tx. Lipitor  . Expressive aphasia 10/2015  . Hearing loss 2011-05-27   wears hearing aids bilaterally  . Kidney calculi 2011-05-27   past hx. x1-passed on own, mild prostate issues-no meds  . PONV (postoperative nausea and vomiting) 05/27/11   with cataract surgery  . Stroke [facility]) 10/2015   Social History   Socioeconomic History  . Marital status: Married    Spouse name: Not on file  . Number of children: 3  . Years of education: 63  . Highest education level: Not  on file  Occupational History  . 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 Financial resource strain: Not on file  . Food insecurity    Worry: Not on file    Inability: Not on file  . Transportation needs    Medical: Not on file    Non-medical: Not on file  Tobacco Use  . Smoking status: Former Smoker    Years: 25.00    Types: Cigarettes    Quit date: 05/26/88    Years since quitting: 30.5  . Smokeless tobacco: Never Used  Substance and Sexual Activity  . Alcohol use: No    Comment: he used to drink alcohol lightly and quit 50 years   . Drug use: No  . Sexual activity: Yes  Lifestyle  . Physical activity    Days per week: Not on file    Minutes per session: Not on file  . Stress: Not on file  Relationships  . Social Herbalist on phone: Not on file    Gets together: Not on file    Attends religious service: Not on file    Active member of club or organization: Not on file    Attends meetings of clubs or organizations: Not on file    Relationship status: Not on file  Other Topics Concern  . Not on file  Social History Narrative  . Not on file   Family History  Problem Relation Age of Onset  . Heart Problems Mother   . Cancer Father   . Cancer Brother   . Stroke Brother   . Heart Problems Brother   . Cancer Brother   . Heart Problems Brother   . Heart Problems Sister   . Hyperlipidemia Sister   . Birth defects Child   . Other Child        one kidney   Scheduled Meds: . amiodarone  400 mg Oral BID  . apixaban  5 mg Oral BID  . feeding supplement (ENSURE ENLIVE)  237 mL Oral TID BM  . levothyroxine  50 mcg Oral QAC breakfast  . mouth rinse  15 mL Mouth Rinse BID  . metoprolol tartrate  12.5 mg Oral BID  . multivitamin with minerals  1 tablet Oral Daily  . pantoprazole  40 mg Oral BID  . potassium chloride  40 mEq Oral Once  . sucralfate  1 g Oral TID WC & HS  . vitamin C  250 mg Oral Daily   Continuous Infusions: . magnesium  sulfate bolus IVPB    . piperacillin-tazobactam (ZOSYN)  IV 3.375 g (11/09/18 0940)   PRN Meds:.acetaminophen **OR** acetaminophen, albuterol, ondansetron **OR** ondansetron (ZOFRAN) IV Medications Prior to Admission:  Prior to Admission medications   Medication Sig Start Date End Date  Taking? Authorizing Provider  amLODipine (NORVASC) 2.5 MG tablet Take 1 tablet (2.5 mg total) by mouth daily. 10/25/18  Yes Alla Feeling, NP  atorvastatin (LIPITOR) 40 MG tablet Take 40 mg by mouth daily.  03/24/18  Yes [provider]  calcium carbonate (TUMS - DOSED IN MG ELEMENTAL CALCIUM) 500 MG chewable tablet Chew 1 tablet by mouth 3 (three) times daily as needed for indigestion or heartburn.   Yes [provider]  Coenzyme Q10 (COQ10) 100 MG CAPS Take 100 mg by mouth daily.   Yes [provider]  ELIQUIS 5 MG TABS tablet TAKE 1 TABLET BY MOUTH TWICE A DAY 12/27/17  Yes Evans Lance, MD  levothyroxine (SYNTHROID) 50 MCG tablet Take 50 mcg by mouth daily before breakfast.   Yes [provider]  mirtazapine (REMERON) 7.5 MG tablet TAKE 1 TABLET BY MOUTH AT BEDTIME. 10/30/18  Yes [name], MD  Multiple Vitamin (MULTIVITAMIN) tablet Take 1 tablet by mouth daily.   Yes [provider]  ondansetron (ZOFRAN) 8 MG tablet Take 1 tablet (8 mg total) by mouth 2 (two) times daily as needed (Nausea or vomiting). 08/09/18  Yes [name], MD  prochlorperazine (COMPAZINE) 10 MG tablet TAKE 1 TABLET (10 MG TOTAL) BY MOUTH EVERY 6 (SIX) HOURS AS NEEDED FOR NAUSEA OR VOMITING. 10/31/18  Yes [name], MD  vitamin C (ASCORBIC ACID) 250 MG tablet Take 250 mg by mouth daily.   Yes [provider]   Allergies  Allergen Reactions  . Codeine Nausea And Vomiting   Review of Systems Denies chest pain  Physical Exam  elderly 78 y.o. year-old male chronically ill-appearing in no acute distress.  Alert and oriented x4.  Hard of hearing.    Regular rate and rhythm with no rubs  or gallops no JVD or thyromegaly.  Clear anteriorly  Abdomen: Mildly distended nontender on palpation hypoactive bowel sounds.  Musculoskeletal: No lower extremity edema.  2 out of 4 pulses in all 4 extremities.   Psychiatry: Mood is appropriate for condition and setting.  Vital Signs: BP 91/72 (BP Location: Right Arm)   Pulse 94   Temp 97.6 F (36.4 C) (Oral)   Resp 19   Ht 6' (1.829 m)   Wt 68.5 kg   SpO2 96%   BMI 20.48 kg/m  Pain Scale: 0-10   Pain Score: 0-No pain   SpO2: SpO2: 96 % O2 Device:SpO2: 96 % O2 Flow Rate: .O2 Flow Rate (L/min): 3 L/min  IO: Intake/output summary:   Intake/Output Summary (Last 24 hours) at 11/09/2018 1308 Last data filed at 11/09/2018 0900 Gross per 24 hour  Intake 684.05 ml  Output 450 ml  Net 234.05 ml    LBM: Last BM Date: 11/09/18 Baseline Weight: Weight: 67.1 kg Most recent weight: Weight: 68.5 kg     Palliative Assessment/Data:     Time In:  12 Time Out:  1300 Time Total:  60 min  Greater than 50%  of this time was spent counseling and coordinating care related to the above assessment and plan.  Signed by: Loistine Chance, MD [phone]  Please contact Palliative Medicine Team phone at [phone] for questions and concerns.  For individual provider: See Shea Evans

## 2018-11-10 NOTE — Progress Notes (Signed)
Pts son received copy of AVS discharge instructions and the copy of the prescription. PTAR also received copy of AVS in discharge packet. Pt IVs were removed and pt transferred to stretcher without complication. Pt taken by PTAR with oxygen on pt. Pt had no concerns or complaints at time of discharge.

## 2018-11-12 ENCOUNTER — Other Ambulatory Visit: Payer: Self-pay | Admitting: Hematology

## 2018-11-12 DIAGNOSIS — R1310 Dysphagia, unspecified: Secondary | ICD-10-CM | POA: Diagnosis not present

## 2018-11-12 DIAGNOSIS — K2210 Ulcer of esophagus without bleeding: Secondary | ICD-10-CM | POA: Diagnosis not present

## 2018-11-12 DIAGNOSIS — Z8673 Personal history of transient ischemic attack (TIA), and cerebral infarction without residual deficits: Secondary | ICD-10-CM | POA: Diagnosis not present

## 2018-11-12 DIAGNOSIS — L89123 Pressure ulcer of left upper back, stage 3: Secondary | ICD-10-CM | POA: Diagnosis not present

## 2018-11-12 DIAGNOSIS — I480 Paroxysmal atrial fibrillation: Secondary | ICD-10-CM | POA: Diagnosis not present

## 2018-11-12 DIAGNOSIS — I429 Cardiomyopathy, unspecified: Secondary | ICD-10-CM | POA: Diagnosis not present

## 2018-11-12 DIAGNOSIS — J9601 Acute respiratory failure with hypoxia: Secondary | ICD-10-CM | POA: Diagnosis not present

## 2018-11-12 DIAGNOSIS — I110 Hypertensive heart disease with heart failure: Secondary | ICD-10-CM | POA: Diagnosis not present

## 2018-11-12 DIAGNOSIS — D62 Acute posthemorrhagic anemia: Secondary | ICD-10-CM | POA: Diagnosis not present

## 2018-11-12 DIAGNOSIS — E039 Hypothyroidism, unspecified: Secondary | ICD-10-CM | POA: Diagnosis not present

## 2018-11-12 DIAGNOSIS — C220 Liver cell carcinoma: Secondary | ICD-10-CM

## 2018-11-12 DIAGNOSIS — L22 Diaper dermatitis: Secondary | ICD-10-CM | POA: Diagnosis not present

## 2018-11-12 DIAGNOSIS — E785 Hyperlipidemia, unspecified: Secondary | ICD-10-CM | POA: Diagnosis not present

## 2018-11-12 DIAGNOSIS — Z87891 Personal history of nicotine dependence: Secondary | ICD-10-CM | POA: Diagnosis not present

## 2018-11-12 DIAGNOSIS — A419 Sepsis, unspecified organism: Secondary | ICD-10-CM | POA: Diagnosis not present

## 2018-11-12 DIAGNOSIS — Z96642 Presence of left artificial hip joint: Secondary | ICD-10-CM | POA: Diagnosis not present

## 2018-11-12 DIAGNOSIS — C229 Malignant neoplasm of liver, not specified as primary or secondary: Secondary | ICD-10-CM | POA: Diagnosis not present

## 2018-11-12 DIAGNOSIS — K922 Gastrointestinal hemorrhage, unspecified: Secondary | ICD-10-CM | POA: Diagnosis not present

## 2018-11-12 DIAGNOSIS — K2970 Gastritis, unspecified, without bleeding: Secondary | ICD-10-CM | POA: Diagnosis not present

## 2018-11-12 DIAGNOSIS — I5043 Acute on chronic combined systolic (congestive) and diastolic (congestive) heart failure: Secondary | ICD-10-CM | POA: Diagnosis not present

## 2018-11-13 ENCOUNTER — Telehealth: Payer: Self-pay

## 2018-11-13 ENCOUNTER — Other Ambulatory Visit: Payer: Self-pay

## 2018-11-13 ENCOUNTER — Telehealth: Payer: Self-pay | Admitting: Hematology

## 2018-11-13 DIAGNOSIS — C220 Liver cell carcinoma: Secondary | ICD-10-CM

## 2018-11-13 MED ORDER — PROCHLORPERAZINE MALEATE 10 MG PO TABS: 10.0000 mg | ORAL_TABLET | Freq: Four times a day (QID) | ORAL | 0 refills | Status: DC | PRN

## 2018-11-13 NOTE — Telephone Encounter (Signed)
Faxed signed order back to [place] for PT, sent to HIM for scanning to chart.

## 2018-11-13 NOTE — Telephone Encounter (Signed)
I called back and left a VM: he can use Melatonin or Benadryl as a sleep aid, if does not work well, OK to use compazine. I encouraged him to call us if needed.   [name] MD

## 2018-11-13 NOTE — Telephone Encounter (Signed)
Spoke with patient's son Quita Skye, he states that his father is unable to move his own body for instance moving from side to side in the bed, has to have help with all activities.  Currently Palliative care and Home Health are in providing PT/OT/speech, they have gotten a hospital bed.  His father is not ready for Hospice at this point and his son is trying to support his decision.  He is asking if they can give him Compazine at night to help him sleep?  Also if they doesn't work can they add in [place]?  They do not want to give him Xanax or Ativan for rest.  [phone]

## 2018-11-13 NOTE — Telephone Encounter (Signed)
Spoke with patient regarding disconnected monitor 

## 2018-11-14 ENCOUNTER — Inpatient Hospital Stay: Payer: Medicare Other

## 2018-11-14 ENCOUNTER — Telehealth: Payer: Self-pay | Admitting: *Deleted

## 2018-11-14 DIAGNOSIS — I110 Hypertensive heart disease with heart failure: Secondary | ICD-10-CM | POA: Diagnosis not present

## 2018-11-14 DIAGNOSIS — L22 Diaper dermatitis: Secondary | ICD-10-CM | POA: Diagnosis not present

## 2018-11-14 DIAGNOSIS — K922 Gastrointestinal hemorrhage, unspecified: Secondary | ICD-10-CM | POA: Diagnosis not present

## 2018-11-14 DIAGNOSIS — C229 Malignant neoplasm of liver, not specified as primary or secondary: Secondary | ICD-10-CM | POA: Diagnosis not present

## 2018-11-14 DIAGNOSIS — L89123 Pressure ulcer of left upper back, stage 3: Secondary | ICD-10-CM | POA: Diagnosis not present

## 2018-11-14 DIAGNOSIS — A419 Sepsis, unspecified organism: Secondary | ICD-10-CM | POA: Diagnosis not present

## 2018-11-14 NOTE — Telephone Encounter (Signed)
Notified of message below.   Spoke with wife regarding her questions and concerns,  Spoke with RN, Sharyn Lull regarding the abdominal distention. She will assess and let us know if she feels he needs a paracentesis. Does not feel it is warranted at this time.

## 2018-11-14 NOTE — Telephone Encounter (Signed)
Son called to say Antonio Manning has been experiencing more abdominal distention-pressure, stomach discomfort, and SOB.  HR has been in the 60s and O2 sat is ~ 90%  Son wants to know if this is going to be the way is due to the cancer?

## 2018-11-14 NOTE — Telephone Encounter (Signed)
Let his son know that we will let palliative nurse evaluate him to see if he needs paracentesis. Yes, most of his symptoms are related to his liver cancer, and we will try to manage his symptoms and support him at home. Thanks   [name] MD

## 2018-11-15 ENCOUNTER — Inpatient Hospital Stay: Payer: Medicare Other | Admitting: Nurse Practitioner

## 2018-11-15 ENCOUNTER — Inpatient Hospital Stay: Payer: Medicare Other

## 2018-11-16 ENCOUNTER — Other Ambulatory Visit: Payer: Self-pay | Admitting: Nurse Practitioner

## 2018-11-16 ENCOUNTER — Other Ambulatory Visit: Payer: Self-pay

## 2018-11-16 DIAGNOSIS — I110 Hypertensive heart disease with heart failure: Secondary | ICD-10-CM | POA: Diagnosis not present

## 2018-11-16 DIAGNOSIS — L89123 Pressure ulcer of left upper back, stage 3: Secondary | ICD-10-CM | POA: Diagnosis not present

## 2018-11-16 DIAGNOSIS — K922 Gastrointestinal hemorrhage, unspecified: Secondary | ICD-10-CM | POA: Diagnosis not present

## 2018-11-16 DIAGNOSIS — L22 Diaper dermatitis: Secondary | ICD-10-CM | POA: Diagnosis not present

## 2018-11-16 DIAGNOSIS — C229 Malignant neoplasm of liver, not specified as primary or secondary: Secondary | ICD-10-CM | POA: Diagnosis not present

## 2018-11-16 DIAGNOSIS — A419 Sepsis, unspecified organism: Secondary | ICD-10-CM | POA: Diagnosis not present

## 2018-11-16 NOTE — Patient Outreach (Signed)
[facility]) Care Management  11/16/2018  Antonio Manning Oct 28, 1940 [id number]  EMMI: general discharge  Referral date: 7/16/ Referral reason: sad/ hopeless/anxious/empty Insurance: Medicare Day # 1  Telephone call to patient regarding EMMI general discharge red alert. HIPAA verified by patients wife/ designated party release, Brendan Gadson.  Wife states patient has had some periods of depression since his diagnosis of cancer. Wife states patient is on quite a few medications and was taking cancer treatments. Wife states patient is not one who likes to take medications.  Wife states patient is not on any medication depression. Wife states she does not think patient needs to see anyone for depression.  Wife reports [facility] providing [facility]. She states she has the contact information for palliative care if needed. Wife states she has talked with patients primary MD office since his discharged for medication adjustments. .  She states patient has had phone follow up with his oncologist since discharge from the hospital. Wife states patient's son is helping to take care of patient and manage his medications. Wife states patient is unable to stand up on his own.  She states patient is scheduled to have a wheelchair delivered on today. She states patient has a walker and hospital bed.  Wife states patient is doing much better. She states patient is eating better. She states patient has some diarrhea due to the antibiotics he is on. Wife states patients doctor is aware of this. Wife states she would like to find out about assistance to help care for patient at home. RNCM discussed [facility] care management services. Wife verbally agreed to follow up with [facility] care management social worker.  Wife denied any further needs at this time.  RNCM advised patient to notify MD of any changes in condition prior to scheduled appointment. RNCM provided contact name and number: [phone]  or main office number [phone] and 24 hour nurse advise line [phone].  RNCM verified patient aware of 911 services for urgent/ emergent needs.  PLAN; RNCM will refer patient to social worker for community resources.  RNCM will mail patient [facility] care management brochure/ magnet.   [name] RN,BSN,CCM [facility] Telephonic  [phone]

## 2018-11-17 ENCOUNTER — Other Ambulatory Visit: Payer: Self-pay

## 2018-11-17 ENCOUNTER — Telehealth: Payer: Self-pay

## 2018-11-17 DIAGNOSIS — C220 Liver cell carcinoma: Secondary | ICD-10-CM

## 2018-11-17 DIAGNOSIS — M7989 Other specified soft tissue disorders: Secondary | ICD-10-CM

## 2018-11-17 DIAGNOSIS — K922 Gastrointestinal hemorrhage, unspecified: Secondary | ICD-10-CM | POA: Diagnosis not present

## 2018-11-17 DIAGNOSIS — C229 Malignant neoplasm of liver, not specified as primary or secondary: Secondary | ICD-10-CM | POA: Diagnosis not present

## 2018-11-17 DIAGNOSIS — A419 Sepsis, unspecified organism: Secondary | ICD-10-CM | POA: Diagnosis not present

## 2018-11-17 DIAGNOSIS — L89123 Pressure ulcer of left upper back, stage 3: Secondary | ICD-10-CM | POA: Diagnosis not present

## 2018-11-17 DIAGNOSIS — I110 Hypertensive heart disease with heart failure: Secondary | ICD-10-CM | POA: Diagnosis not present

## 2018-11-17 DIAGNOSIS — L22 Diaper dermatitis: Secondary | ICD-10-CM | POA: Diagnosis not present

## 2018-11-17 MED ORDER — FUROSEMIDE 20 MG PO TABS: 20.0000 mg | ORAL_TABLET | Freq: Every day | ORAL | 0 refills | Status: DC | PRN

## 2018-11-17 MED ORDER — POTASSIUM CHLORIDE CRYS ER 20 MEQ PO TBCR: 20.0000 meq | EXTENDED_RELEASE_TABLET | Freq: Every day | ORAL | 0 refills | Status: DC

## 2018-11-17 NOTE — Progress Notes (Signed)
[place]   Telephone:([phone] Fax:(336) [phone]   Clinic Follow up Note   Patient Care Team: Hulan Fess, MD as PCP - General (Family Medicine) Croitoru, Dani Gobble, MD as PCP - Cardiology (Cardiology) Arna Snipe, RN as Oncology Nurse Navigator Evans Lance, MD as Consulting Physician (Cardiology) Chrismon, Amber as Harris, Maree Erie, LCSW as [place] [employer] (Licensed Clinical Social Worker)   I connected with Antonio Manning on 11/22/2018 at  9:30 AM EDT by video enabled telemedicine visit and verified that I am speaking with the correct person using two identifiers.  I discussed the limitations, risks, security and privacy concerns of performing an evaluation and management service by telephone and the availability of in person appointments. I also discussed with the patient that there may be a patient responsible charge related to this service. The patient expressed understanding and agreed to proceed.   Other persons participating in the visit and their role in the encounter:  His son, daughter and wife  Patient's location:  His home  Provider's location:  My Office   CHIEF COMPLAINT: F/u of [place]  SUMMARY OF ONCOLOGIC HISTORY: Oncology History  Hepatocellular carcinoma ([place])  07/03/2018 Imaging   CT AP W Contrast 07/03/18  IMPRESSION: Innumerable hepatic masses, predominantly involving the left hepatic lobe, including a dominant 8.9 cm mass in segment 4. This appearance is suspicious for metastatic disease versus multifocal HCC.  1.9 cm enhancing lesion in the posterior left upper kidney, suspicious for solid renal neoplasm such as renal cell carcinoma. Given the additional findings, this is of questionable clinical significance. Small volume abdominopelvic ascites.    07/28/2018 Initial Biopsy   Diagnosis 07/28/18 Liver, needle/core biopsy, Left lobe - HEPATOCELLULAR CARCINOMA. SEE  COMMENT.   07/28/2018 Cancer Staging   Staging form: Liver, AJCC 8th Edition - Clinical stage from 07/28/2018: Stage IIIA (cT3, cN0, cM0) - Signed by [name], MD on 08/09/2018   08/04/2018 Initial Diagnosis   Hepatocellular carcinoma ([place])   08/13/2018 Imaging   CT Chest 08/13/18  IMPRESSION: 1. No evidence of thoracic metastatic disease. 2. Emphysema with right middle lobe bronchiectasis. 3. Ascending thoracic aortic aneurysm. Recommend semi-annual imaging followup by CTA or MRA and referral to cardiothoracic surgery if not already obtained. This recommendation follows 2010 ACCF/AHA/AATS/ACR/ASA/SCA/SCAI/SIR/STS/SVM Guidelines for the Diagnosis and Management of Patients With Thoracic Aortic Disease. Circulation. 2010; 121: M094-B096 4. Multicentric hepatocellular carcinoma with involvement of the right lobe. ADDENDUM: incidental finding in the original report. There is a subcutaneous lesion in the upper back measuring 2.0 cm and 2 HU on image 4/2, likely an incidental an epidermal inclusion (sebaceous) cyst.   08/23/2018 -  Chemotherapy   Tecentriq (Atezolizumab) and Avastin (bevacizumab) starting 08/23/18   10/16/2018 Imaging   CT AP W Contast  IMPRESSION: 1. Extensive tumor burden identified within both lobes of liver compatible with multifocal hepatoma. When compared with previous exam index lesions demonstrate mild increase in size from previous study. 2. Increase in volume of ascites. 3. Aortocaval  lymph node adenopathy increased in the interval. 4. Increase in volume of right pleural effusion. 5.  Aortic Atherosclerosis (ICD10-I70.0). 6. Ectatic abdominal aorta at risk for aneurysm development. Recommend followup by ultrasound in 5 years. This recommendation follows ACR consensus guidelines: White Paper of the ACR Incidental Findings Committee II on Vascular Findings. J Am Coll Radiol 2013; 10:789-794.      CURRENT THERAPY:  Tecentriq (Atezolizumab)1200mg and  Avastin (bevacizumab)15mg /kg every 3 weeksstarting 08/23/18.  Stopped after cycle 4 due to PNA, Rapid AF and GI bleeding.   INTERVAL HISTORY:  Antonio Manning is here for a follow up after recent hospitalization. They identified themselves by face to face video. His son notes the patient told him he is depressed, feels jittery and dreads the night. He is not able to completely elaborate on it. He tried Mirtazapine and did not tolerate well.  Since discharge from hospital he feels stable. He feels he is not able to do much at home. He is not ambulating not even to kitchen or bathroom due to weakness. He takes a few step more each day. He still being seen by PT who plans to get him on his feet next week. He currently uses bedpan and urinal.  He notes he is awake most of the day, but spends a lot of time in bed. He is eating by soft foods and liquids currently. He is interested in another stimulant.  He notes he is depressed and has no interest in anything. He does dread to go to sleep.    REVIEW OF SYSTEMS:   Constitutional: Denies fevers, chills or abnormal weight loss (+) soft food and liquid diet (+) insomnia  Eyes: Denies blurriness of vision Ears, nose, mouth, throat, and face: Denies mucositis or sore throat Respiratory: Denies cough, dyspnea or wheezes Cardiovascular: Denies palpitation, chest discomfort or lower extremity swelling Gastrointestinal:  Denies nausea, heartburn or change in bowel habits Skin: Denies abnormal skin rashes Lymphatics: Denies new lymphadenopathy or easy bruising Neurological: (+) body weakness (+) very little ambulation Behavioral/Psych: (+) depressed All other systems were reviewed with the patient and are negative.  MEDICAL HISTORY:  Past Medical History:  Diagnosis Date  . Arthritis 06/01/11   osteoarthritis,lt. hip, and neck area  . Cancer ([place]) 06-01-11   skin cancer lesions-multiple  . Cataract 01-Jun-2011   right  . Cholesterol serum elevated 06-01-2011    tx. Lipitor  . Expressive aphasia 10/2015  . Hearing loss June 01, 2011   wears hearing aids bilaterally  . Kidney calculi 06/01/2011   past hx. x1-passed on own, mild prostate issues-no meds  . PONV (postoperative nausea and vomiting) 2011/06/01   with cataract surgery  . Stroke [facility]) 10/2015    SURGICAL HISTORY: Past Surgical History:  Procedure Laterality Date  . BACK SURGERY  06/01/11   Lumbar disc surgery-no hardware  . BIOPSY  11/03/2018   Procedure: BIOPSY;  Surgeon: Otis Brace, MD;  Location: [facility];  Service: Gastroenterology;;  . CATARACT EXTRACTION W/ INTRAOCULAR LENS IMPLANT  June 01, 2011   left eye  . EP IMPLANTABLE DEVICE N/A 10/29/2015   Procedure: Loop Recorder Insertion;  Surgeon: Evans Lance, MD;  Location: [place] CV LAB;  Service: Cardiovascular;  Laterality: N/A;  . ESOPHAGOGASTRODUODENOSCOPY (EGD) WITH PROPOFOL N/A 11/03/2018   Procedure: ESOPHAGOGASTRODUODENOSCOPY (EGD) WITH PROPOFOL;  Surgeon: Otis Brace, MD;  Location: [facility];  Service: Gastroenterology;  Laterality: N/A;  . EYE SURGERY     eye lid lift   . I&D EXTREMITY Right 01/17/2016   Procedure: IRRIGATION AND DEBRIDEMENT LEG LACERATION;  Surgeon: Vickey Huger, MD;  Location: [place];  Service: Orthopedics;  Laterality: Right;  . TEE WITHOUT CARDIOVERSION N/A 10/29/2015   Procedure: TRANSESOPHAGEAL ECHOCARDIOGRAM (TEE);  Surgeon: Larey Dresser, MD;  Location: [place];  Service: Cardiovascular;  Laterality: N/A;  . TOTAL HIP ARTHROPLASTY  05/20/2011   Procedure: TOTAL HIP ARTHROPLASTY ANTERIOR APPROACH;  Surgeon: Mauri Pole, MD;  [facility];  Service:  Orthopedics;  Laterality: Left;    I have reviewed the social history and family history with the patient and they are unchanged from previous note.  ALLERGIES:  is allergic to codeine.  MEDICATIONS:  Current Outpatient Medications  Medication Sig Dispense Refill  . amiodarone (PACERONE) 400 MG tablet Take 1 tablet (400 mg total)  by mouth 2 (two) times daily. Please take as instructed below only Take 400 mg bid amiodarone for one week then 400 qd x 1 week. Then 200 mg daily as the maintenance dose. 60 tablet 0  . calcium carbonate (TUMS - DOSED IN MG ELEMENTAL CALCIUM) 500 MG chewable tablet Chew 1 tablet by mouth 3 (three) times daily as needed for indigestion or heartburn.    . Coenzyme Q10 (COQ10) 100 MG CAPS Take 100 mg by mouth daily.    Marland Kitchen ELIQUIS 5 MG TABS tablet TAKE 1 TABLET BY MOUTH TWICE A DAY 180 tablet 2  . furosemide (LASIX) 20 MG tablet Take 1 tablet (20 mg total) by mouth daily as needed. Take 1/2 to 1 tablet by mouth daily as needed 30 tablet 0  . levothyroxine (SYNTHROID) 50 MCG tablet Take 50 mcg by mouth daily before breakfast.    . megestrol (MEGACE ES) 625 MG/5ML suspension Take 5 mLs (625 mg total) by mouth daily. 150 mL 0  . metoprolol succinate (TOPROL XL) 25 MG 24 hr tablet Take 0.5 tablets (12.5 mg total) by mouth daily. 30 tablet 0  . Multiple Vitamin (MULTIVITAMIN) tablet Take 1 tablet by mouth daily.    . ondansetron (ZOFRAN) 8 MG tablet TAKE 1 TABLET (8 MG TOTAL) BY MOUTH 2 (TWO) TIMES DAILY AS NEEDED (NAUSEA OR VOMITING). 30 tablet 1  . pantoprazole (PROTONIX) 40 MG tablet Take 1 tablet (40 mg total) by mouth 2 (two) times daily. 60 tablet 0  . potassium chloride SA (K-DUR) 20 MEQ tablet Take 1 tablet (20 mEq total) by mouth daily. Take 1 tablet daily on days taking Lasix. 30 tablet 0  . prochlorperazine (COMPAZINE) 10 MG tablet Take 1 tablet (10 mg total) by mouth every 6 (six) hours as needed for nausea or vomiting. 30 tablet 0  . sucralfate (CARAFATE) 1 GM/10ML suspension Take 10 mLs (1 g total) by mouth 4 (four) times daily -  with meals and at bedtime. 420 mL 0  . vitamin C (ASCORBIC ACID) 250 MG tablet Take 250 mg by mouth daily.     No current facility-administered medications for this visit.     PHYSICAL EXAMINATION: ECOG PERFORMANCE STATUS: 4 - Bedbound  No vitals taken today,  Exam not performed today   LABORATORY DATA:  I have reviewed the data as listed CBC Latest Ref Rng & Units 11/09/2018 11/08/2018 11/07/2018  WBC 4.0 - 10.5 K/uL 10.9(H) 10.3 9.7  Hemoglobin 13.0 - 17.0 g/dL 9.1(L) 8.7(L) 9.0(L)  Hematocrit 39.0 - 52.0 % 30.3(L) 27.5(L) 28.0(L)  Platelets 150 - 400 K/uL 330 300 247     CMP Latest Ref Rng & Units 11/09/2018 11/08/2018 11/07/2018  Glucose 70 - 99 mg/dL 105(H) 109(H) 105(H)  BUN 8 - 23 mg/dL 22 23 22   Creatinine 0.61 - 1.24 mg/dL 0.49(L) 0.48(L) 0.44(L)  Sodium 135 - 145 mmol/L 138 139 139  Potassium 3.5 - 5.1 mmol/L 3.4(L) 3.1(L) 3.0(L)  Chloride 98 - 111 mmol/L 103 106 105  CO2 22 - 32 mmol/L 21(L) 22 24  Calcium 8.9 - 10.3 mg/dL 8.4(L) 8.6(L) 8.3(L)  Total Protein 6.5 - 8.1 g/dL - 5.7(L)  5.7(L)  Total Bilirubin 0.3 - 1.2 mg/dL - 2.5(H) 1.9(H)  Alkaline Phos 38 - 126 U/L - 193(H) 350(H)  AST 15 - 41 U/L - 34 68(H)  ALT 0 - 44 U/L - 22 41      RADIOGRAPHIC STUDIES: I have personally reviewed the radiological images as listed and agreed with the findings in the report. No results found.   ASSESSMENT & PLAN:  Antonio Manning is a 78 y.o. male with   1. Hepatocellular Carcinoma, cT3N0M0, stage IIIA -He was recently diagnosed in 07/2018. -His CT AP from 07/03/18 shows multifocal large tumor in his live, mainly in left lobe. Biopsy from 07/28/18 showed [place]. There is also a 1.9 lesion seen on left kidney suspicions for kidney cancer.No evidence of distant metastasison stage CT scans. -Due tolarge tumor burden,he started First-lineTecentriqandAvastinq3weeks based on theIMbrave150studyresulton 08/23/18, he has been tolerating well. -He was hospitalized on 11/02/18 for sepsis secondary to PNA. His hospital course was complicated by rapid AF and GI bleeding from diffuse erosive gastritis and esophagitis as seen on 11/03/18 EGD. He was treated antibiotics and PPI and discharged home 11/09/18.  -Since discharge he has not improved much, overall  poor performance status, mainly bedbound. He ambulates very little. He is on O2 canula at home. I recommend he continue PT to help him get back on his feet and to use recliner to both sit up and help breathing as well as elevate his legs due to swelling. -we discussed his low appetite issue, he did not tolerate mirtazapine.  I recommend him to try Megace, medication was called in today. -he has been depressed lately, see management below. -I again discussed the overall guarded prognosis with patient and his family in detail today, and with poor performance status I would not recommend more chemo or treatment at this point. I discussed him getting more home evaluation and management of his symptoms by starting palliative care. He and his family is interested.  Referral was previously made, and he is scheduled to meet palliative care team today.  I encouraged him to consider hospice, and he can transition from palliative care to hospice when he is ready.    -f/u open and as needed   2. Lower appetite,weight loss  -Secondary to nausea, taste changeand underlying malignancy -I instructed him to use anti-emetics 30 minutes before each meal. -I previously prescribed him Mirtazapine. He did not tolerate well -Since 11/2018 hospital discharge he has had soft foods and liquid diet. To help his appetite I recommend Megace. He is agreeable. I will call in today (11/22/18)   3. Left Kidney lesion  -Seen on 07/03/18 CT AP which is suspicions for kidney cancer.We discussed that kidney cancer is usually slow growing.Further workupand treatment will be held at this time due to his incurableHCC. Will monitor closely for now.  4. AF -On Eliquis, follow-up withcardiology -He had repaid AF during hospitalization. Improved   5. Diffuse Upper abdominal painand bloating  -Secondary to malignancy, tolerable and mainly with laying down.He has not been taking pain meds -He hadHepatomegaly and moderate  ascites on recent CT  -He has developed more dyspnea, and abdominal bloating.  I previously called in Lasix, and encouraged him to use as needed.  6. Pleural Effusion and abdominal ascites, LE edema  -There is small pleural effusion and abdominal ascites seen on CT scan as well. It is undetermined if the cause is from cancer.  -He does have occasional SOB with light activity.  -Will watch  and do chest Xray if his dyspnea gets worse  -S/p PNA, he is currently on O2 canula, with O2 sat 90-98.  -I recommend he use recliner to help him breath better and deeper and help with LE edema -By video his abdomen is mildly bloated, based on shape of his abdomen, there is no indication of significant ascites at this time.  -He has not started lasix yet. He can start at 10mg  every other day as needed.   7. Recent upper GI bleed/severe erosive esophagitis and mild gastritis  -Seen on 11/03/18 EGD  -Started treatment with Protonix, will continue indefinitely with sucralfate for 2 weeks.   8. Depression, Insomnia   -He has been depressed lately and having trouble sleeping  -I recommend he consult with SW to help him with this. He is interested.  -I offered SSRI to help with his depression. He will think about it.  -[facility] SW will call pt and his family members   36. Goal of care discussion  -We again discussed the incurable nature of his cancer, and the overall poor prognosis, especially if he does not have good response to therapy  -The patient understands the goal of care is palliative.   PLAN: -Will meet with palliative care nurse today  -Set up consult with  [facility] SW  -I called in Megace today  -f/u open, as needed   No problem-specific Assessment & Plan notes found for this encounter.   No orders of the defined types were placed in this encounter.  I discussed the assessment and treatment plan with the patient. The patient was provided an opportunity to ask questions and all were answered. The  patient agreed with the plan and demonstrated an understanding of the instructions.  The patient was advised to call back or seek an in-person evaluation if the symptoms worsen or if the condition fails to improve as anticipated.  I provided 25 minutes of face-to-face video visit time during this encounter, and > 50% was spent counseling as documented under my assessment & plan.    [name], MD 11/22/2018   I, Joslyn Devon, am acting as scribe for [name], MD.

## 2018-11-17 NOTE — Telephone Encounter (Signed)
Faxed signed order back to [facility], sent to HIM for scanning to chart.

## 2018-11-17 NOTE — Telephone Encounter (Signed)
Patient's son calls stating that yesterday was a bad day for his father as far as his breathing.  He kept saying he felt like he couldn't get a deep breath in.  Is on continuous oxygen with O2 Sat at 92%, pulse 75.  He is having some lower extremity swelling.  Abdomen is tight but no worse than it was when he was discharged from the hospital.  He is wondering if a low dose of Lasix temporarily will help?     His [phone]

## 2018-11-17 NOTE — Telephone Encounter (Signed)
Spoke with patient's son Quita Skye regarding patient's increased shortness of breath and swelling.  Per Dr. Burr Medico okay to take Lasix 20 mg 1/2 to 1 tab daily prn.  While taking Lasix the patient needs to take Potassium 20 meq if can get down or eat a banana.  Also informed him contacted Palliative Care to come out and see the family.  Arrange WebEx visit on Wednesday 7/22 at 9:30, scheduling message was sent.

## 2018-11-17 NOTE — Patient Outreach (Signed)
[facility]) Care Management  11/17/2018  Jovon Winterhalter Twitty October 09, 1940 [id number]   Social work referral received from [facility] [employer].   "Referral to social worker for community resources. Patient was referred by [facility] general discharge red alert. Wife, Ilai Hiller is patients designated party release. Wife is requesting information regarding assistance at home for patients care. No other services referred at this time". Successful outreach to spouse today.  BSW and patient discussed Medical laboratory scientific officer services as well as [employer].  Patient is over the limit to qualify for Medicaid so spouse was informed that aide services would have to be privately paid for. Spouse reports that they may be able to afford services "depending on cost." BSW informed that cost varies depending on agency and level of care provided.  BSW did give her a rough estimate of cost but told her agencies would have to provide exact cost for services.  Spouse reports that a family friend had aide services for her husband before he passed and they were very pleased with the services.  She could not recall the name of the agency but said she is awaiting contact information for them from her friend.  BSW will send Home Care and Hospice Directory to spouse. Will follow up within the next two weeks to ensure receipt.  Ronn Melena, BSW Social Worker [phone]

## 2018-11-20 ENCOUNTER — Telehealth: Payer: Self-pay | Admitting: *Deleted

## 2018-11-20 ENCOUNTER — Telehealth: Payer: Self-pay

## 2018-11-20 DIAGNOSIS — L89123 Pressure ulcer of left upper back, stage 3: Secondary | ICD-10-CM | POA: Diagnosis not present

## 2018-11-20 DIAGNOSIS — I110 Hypertensive heart disease with heart failure: Secondary | ICD-10-CM | POA: Diagnosis not present

## 2018-11-20 DIAGNOSIS — A419 Sepsis, unspecified organism: Secondary | ICD-10-CM | POA: Diagnosis not present

## 2018-11-20 DIAGNOSIS — L22 Diaper dermatitis: Secondary | ICD-10-CM | POA: Diagnosis not present

## 2018-11-20 DIAGNOSIS — K922 Gastrointestinal hemorrhage, unspecified: Secondary | ICD-10-CM | POA: Diagnosis not present

## 2018-11-20 DIAGNOSIS — C229 Malignant neoplasm of liver, not specified as primary or secondary: Secondary | ICD-10-CM | POA: Diagnosis not present

## 2018-11-20 NOTE — Telephone Encounter (Signed)
Received a message from the [place] and patient's daughter, [name], regarding scheduling a Palliative Care visit. I spoke with patient's daughter and wife, [name]. Visit scheduled for Wednesday 11/22/18 at 1:00pm.

## 2018-11-20 NOTE — Telephone Encounter (Signed)
Patient's daughter Juliann Pulse calls wanting to know if he needs to refill his Augmentin.  He was given this when discharged from the hospital.    [phone]

## 2018-11-20 NOTE — Telephone Encounter (Signed)
Spoke with patient's daughter Juliann Pulse regarding Augmentin, she states he is afebrile, per Cira Rue NP okay to not refill Augmentin at this time, they have a video visit with Dr. Burr Medico on Wednesday.  She also was asking about Palliative Care as they have not heard from them.  High Priority message was sent to [facility] with Palliative Care to follow up.

## 2018-11-20 NOTE — Telephone Encounter (Signed)
If symptoms are improving and afebrile, OK to complete the course then stop antibiotics. He will have webex call with Dr. Burr Medico this week about his condition and further care, etc. Let me know if they have more questions or concerns.  Thanks, Regan Rakers

## 2018-11-21 ENCOUNTER — Telehealth: Payer: Self-pay | Admitting: Hematology

## 2018-11-21 ENCOUNTER — Telehealth: Payer: Self-pay | Admitting: *Deleted

## 2018-11-21 DIAGNOSIS — L22 Diaper dermatitis: Secondary | ICD-10-CM | POA: Diagnosis not present

## 2018-11-21 DIAGNOSIS — L89123 Pressure ulcer of left upper back, stage 3: Secondary | ICD-10-CM | POA: Diagnosis not present

## 2018-11-21 DIAGNOSIS — I110 Hypertensive heart disease with heart failure: Secondary | ICD-10-CM | POA: Diagnosis not present

## 2018-11-21 DIAGNOSIS — K922 Gastrointestinal hemorrhage, unspecified: Secondary | ICD-10-CM | POA: Diagnosis not present

## 2018-11-21 DIAGNOSIS — C229 Malignant neoplasm of liver, not specified as primary or secondary: Secondary | ICD-10-CM | POA: Diagnosis not present

## 2018-11-21 DIAGNOSIS — A419 Sepsis, unspecified organism: Secondary | ICD-10-CM | POA: Diagnosis not present

## 2018-11-21 NOTE — Telephone Encounter (Signed)
Called patient regarding upcoming Webex appointment, patient is notified and e-mail has been sent. °

## 2018-11-21 NOTE — Telephone Encounter (Signed)
Thanks for the message, Janifer Adie. Sounds like he may benefit from mirtazapine for depression and appetite. I don't see any SW notes in Epic. Webb Silversmith, could you please step in and assess his mental health needs?  Thanks, Regan Rakers

## 2018-11-21 NOTE — Telephone Encounter (Addendum)
Received vm call from [place] stating that pt/family was inquiring about something for appetite & she & family would like a call back.  Message routed to MD/Pod RN.  Pt has WebX appt tomorrow & can be discussed then if no response today.   Also received vm call from son, Quita Skye, stating pt has said that he thinks he is depressed which is totally out of character for dad to talk about any of his emotions. Dad has stated that he doesn't feel right.  He is wondering about a consult with a psychiatrist.  He also mentions appetite & state that dad is really trying to eat & doing better but states no real desire to eat.  [name] mentioned not repeating what was given in hospital where his dad freaked out.  [name] states that pallitive care called yest & plans to come out tomorrow.  We talked about our SW doing some counseling & he states that his mom has been talking to a SW but he doesn't know who she is or where she is from & will touch base with his mom & let us know. He is fine to have Dr Burr Medico discuss these issues tomorrow with pt. Forwarded to Dr Burr Medico.

## 2018-11-22 ENCOUNTER — Other Ambulatory Visit: Payer: Self-pay | Admitting: *Deleted

## 2018-11-22 ENCOUNTER — Encounter: Payer: Self-pay | Admitting: *Deleted

## 2018-11-22 ENCOUNTER — Other Ambulatory Visit: Payer: Medicare Other | Admitting: *Deleted

## 2018-11-22 ENCOUNTER — Encounter: Payer: Self-pay | Admitting: Hematology

## 2018-11-22 ENCOUNTER — Inpatient Hospital Stay: Payer: Medicare Other | Attending: Hematology | Admitting: Hematology

## 2018-11-22 ENCOUNTER — Other Ambulatory Visit: Payer: Self-pay

## 2018-11-22 DIAGNOSIS — Z515 Encounter for palliative care: Secondary | ICD-10-CM

## 2018-11-22 DIAGNOSIS — D649 Anemia, unspecified: Secondary | ICD-10-CM

## 2018-11-22 DIAGNOSIS — C220 Liver cell carcinoma: Secondary | ICD-10-CM

## 2018-11-22 DIAGNOSIS — I480 Paroxysmal atrial fibrillation: Secondary | ICD-10-CM | POA: Diagnosis not present

## 2018-11-22 MED ORDER — MEGESTROL ACETATE 625 MG/5ML PO SUSP: 625.0000 mg | Freq: Every day | ORAL | 0 refills | Status: DC

## 2018-11-22 NOTE — Patient Outreach (Signed)
[facility]) Care Management  11/22/2018  Antonio Manning Apr 14, 1941 [id number]   Erroneous encounter.  [name], BSW, MSW, LCSW  Licensed Education officer, [facility]  Mailing [place] [address] [place], [place] [address] Physical Address-[address], [place], [place] [address] Toll Free Main # [phone] Fax # [phone] Cell # [phone]  Office # [phone] [email or web address]

## 2018-11-22 NOTE — Patient Outreach (Addendum)
Antonio Manning) Care Management  11/22/2018  Brenon Antosh Manning [birth year] [id number]   CSW was able to make initial contact with patient's wife, Anastacio Bua and patient's son, [name] today to perform the phone assessment on patient, as well as assess and assist with social work needs and services.    CSW introduced self, explained role and types of services provided through [facility] ([facility]).  CSW further explained to Antonio Manning and Antonio Manning that [facility] works with patient's RNCM, also with [facility], [place] Spaniel. CSW then explained the reason for the call, indicating that Mrs. Antonio Manning thought that patient would benefit from social work services and resources to assist with a mental health referral, as well as provide counseling and supportive services for symptoms of anxiety and depression.  CSW obtained two HIPAA compliant identifiers from Antonio Manning, as well as Antonio Manning, which included patient's name and date of birth.  CSW requested to speak with patient directly, but Antonio Manning declined, indicating that patient is extremely hard of hearing.  Antonio Manning went on to say that patient also has difficulty forming sentences, becoming quite frustrated while trying to speak to someone over the phone.  CSW agreed to schedule a Webex meeting with patient to address patient's concerns, as well as provide face-to-face counseling and supportive services.  Antonio Manning and Antonio Manning both reported that patient would be agreeable to this plan.  Antonio Manning requested that CSW come into her home to meet with patient in person, but CSW explained to her and Antonio Manning that CSW is currently not able to perform home visits due to COVID-19 restrictions.  Antonio Manning then reminded Antonio Manning that she is under strict quarantine restrictions due to her lung disease.  Antonio Manning indicated that Antonio Manning is sent to another part of the house when visits are made from home health staff.    CSW is aware  that patient is receiving home health services through [facility] in the form of nursing, physical therapy, occupational therapy, speech therapy and social work services.  Antonio Manning reported that patient was able to ambulate with the assistance of his rolling walker, prior to being admitted into the Manning, but is now too weak and fatigued to barely stand.  Antonio Manning is hopeful that patient will be able to ambulate again, after working with physical and occupational therapies.  Antonio Manning indicated that she has the following equipment for patient in the home:  Wheelchair, rolling walker, bedside commode/3-in-1 and oxygen.  The need for additional durable medical equipment has not been identified at this time; although, CSW will recommend that patient receive a Manning bed, bedside table and hoyer lift for home use, if patient remains bed/wheelchair bound.  Mrs. Bajorek and Antonio Manning both reported speaking with Antonio Manning, [place] Social Worker with [facility], on separate occasions, to address patient's symptoms of depression.  Antonio Manning indicated that he received a Geriatric Depression Screening Guide from Antonio Manning, but did not address the screening with patient, feeling as though it would be futile.  Antonio Manning stated, "We already know that my father would answer "yes" to all of the depression questions, so what would be the point"?  CSW was able to confirm with Antonio Manning that patient is not experiencing homicidal or suicidal ideations.  Both felt that patient would greatly benefit from being prescribed an antidepressant medication, "just something to take the edge off".  CSW voiced understanding, agreeing to converse  with patient's Primary Care Physician, Antonio Manning about the possibility of prescribing something.  Mrs. Pargas and Antonio Manning were both of the mindset that patient would do well with psychotherapeutic services, as well as medication, in conjunction with one-another.  Antonio Manning was  adamant about patient not being prescribed Remeron, indicating that patient was given several doses while hospitalized, to act as a sleep agent, appetite stimulant and antidepressant, but that patient "freaked out", needing to be restrained, then not remembering his actions or behaviors for several days thereafter.  Antonio Manning reported that he and his brother Antonio, Manning. are currently living with patient and Antonio Manning to provide patient with 24 hour care and supervision, as well as assistance with activities of daily living.  Antonio Manning became tearful when discussing with CSW the conversation that he had with patient in which patient admitted that he still has a very strong will to live.  Antonio Manning indicated that patient was given a prognosis of 3-4 weeks, if malnutrition continues and 3-4 months, under normal circumstances.  Antonio Manning went on to say that all of patient's cancer treatments have been discontinued and that he and his family are scheduled to meet with a representative from [facility] [employer] [facility] today at 1:00PM.  Antonio Manning stated, "I know my dad senses that the end is near because he told me that he loved me for the first time in my life".  Antonio Manning went on to say, "That is a very powerful statement, especially coming from a man that has never shown any emotion".  CSW was able to provide counseling and supportive services to Antonio Manning and Antonio Manning today, agreeing to contact patient via Webex tomorrow, on Thursday, November 23, 2018 at 12:00PM, to do the same.  CSW obtained Antonio Manning's email address  [email or web address]) and successfully scheduled the Webex meeting.  Antonio Manning encouraged CSW to discuss end-of-life care issues with patient, wanting to ensure that patient has all of his affairs in order.  Antonio Manning also wants CSW to "broach the subject of end-of-life with dad to confirm that we are aware of his wishes so that we can respect them when the time comes".  CSW voiced understanding and was agreeable  to this plan.  Antonio Manning, BSW, MSW, LCSW  Licensed Education officer, [facility]  Mailing [place] [address], [place], [place] [address] Physical Address-[address], [place], [place] [address] Toll Free Main # [phone] Fax # [phone] Cell # [phone]  Office # [phone] [email or web address]

## 2018-11-23 ENCOUNTER — Other Ambulatory Visit: Payer: Self-pay | Admitting: *Deleted

## 2018-11-23 ENCOUNTER — Telehealth: Payer: Self-pay

## 2018-11-23 DIAGNOSIS — C220 Liver cell carcinoma: Secondary | ICD-10-CM | POA: Diagnosis not present

## 2018-11-23 NOTE — Telephone Encounter (Signed)
Patient's daughter calling asking if he needs to continue taking the Carafate, spoke with Dr. Burr Medico, instructed her per Dr. Burr Medico no need to continue it. She verbalized an understanding.

## 2018-11-23 NOTE — Patient Outreach (Signed)
[facility]) Care Management  11/23/2018  Antonio Manning 06/03/1940 [id number]   CSW was able to perform a Webex call with patient, patient's wife, Antonio Manning, patient's daughter, Antonio Manning and patient's son, Antonio Manning today at noon.  Patient appeared to be in good spirits today, reporting that he is free from pain, has much more of an appetite and was able to sleep soundly throughout the night last evening.  Several times throughout the conversation, CSW noticed that Mrs. Dils would answer questions for patient or finish his sentences, not really allowing him an opportunity to talk.  Patient and Mrs. Nevels admitted that their home visit with the palliative care nurse, from [facility] went well yesterday (Wednesday, November 22, 2018 at 1:00PM) and that they are definitely receptive to patient receiving palliative care services.  Patient is scheduled to receive another home visit from his palliative care nurse early next week.  Mrs. Splinter was not at all interested in discussing hospice services for patient, reporting that patient is not ready for end-of-life care.  Mrs. Lazard spent a great deal of time talking about her own medical concerns and how her life is being impacted by patient's terminal illness.  Mrs. Caseres admitted that she resents the fact that patient requires so many in-home care services, making her more susceptible to contracting COVID-19.  CSW offered suggestions of things that Mrs. Rieke could do to try and avoid contact with the virus, if brought into her own, in addition to providing counseling and supportive services for her symptoms of depression and anxiety.  CSW really tried to engage with patient, often having to redirect Mrs. Burdett to allow patient an opportunity to discuss his own thoughts and feelings.  Toward the end of the session, CSW received a text message from patient's other son, Antonio Manning,  indicating that he and his siblings were not at all pleased with the way the session went today.  [name] reported that he plans to speak with patient privately to obtain his permission to converse with CSW directly, with no interruptions and no family members present.  Mrs. Doten requested that CSW contact patient again in two weeks, on Wednesday, December 06, 2018, around 10:00AM, as she did not feel the need for patient to receive weekly counseling services.  [name] indicated that he also plans to discuss this with patient, informing CSW that she will probably be receiving a call from them much sooner than August 5th, "because they are able to see the benefit of patient being able to open up and discuss his feelings freely with an unbiased individual".  CSW voiced understanding and was agreeable to this plan.  Antonio Manning, BSW, MSW, LCSW  Licensed Education officer, [facility]  Mailing [place] [address], [place], [place] [address] Physical Address-[address], [place], [place] [address] Toll Free Main # [phone] Fax # [phone] Cell # [phone]  Office # [phone] [email or web address]

## 2018-11-24 ENCOUNTER — Telehealth: Payer: Self-pay

## 2018-11-24 ENCOUNTER — Other Ambulatory Visit: Payer: Medicare Other | Admitting: *Deleted

## 2018-11-24 ENCOUNTER — Other Ambulatory Visit: Payer: Self-pay

## 2018-11-24 DIAGNOSIS — Z515 Encounter for palliative care: Secondary | ICD-10-CM

## 2018-11-24 DIAGNOSIS — K922 Gastrointestinal hemorrhage, unspecified: Secondary | ICD-10-CM | POA: Diagnosis not present

## 2018-11-24 DIAGNOSIS — L89123 Pressure ulcer of left upper back, stage 3: Secondary | ICD-10-CM | POA: Diagnosis not present

## 2018-11-24 DIAGNOSIS — L22 Diaper dermatitis: Secondary | ICD-10-CM | POA: Diagnosis not present

## 2018-11-24 DIAGNOSIS — C229 Malignant neoplasm of liver, not specified as primary or secondary: Secondary | ICD-10-CM | POA: Diagnosis not present

## 2018-11-24 DIAGNOSIS — I110 Hypertensive heart disease with heart failure: Secondary | ICD-10-CM | POA: Diagnosis not present

## 2018-11-24 DIAGNOSIS — A419 Sepsis, unspecified organism: Secondary | ICD-10-CM | POA: Diagnosis not present

## 2018-11-24 NOTE — Progress Notes (Signed)
COMMUNITY PALLIATIVE CARE RN NOTE  PATIENT NAME: Antonio Manning DOB: 28-Aug-1940 MRN: [id number]  PRIMARY CARE PROVIDER: Hulan Fess, MD  RESPONSIBLE PARTY: [name] (wife) Acct ID - Guarantor Home Phone Work Phone Relationship Acct Type  [id number] - Posner,Jailon L [phone]  Self P/F     [address], [place], [place] [address]   Covid-19 Pre-screening Negative  PLAN OF CARE and INTERVENTION:  1. ADVANCE CARE PLANNING/GOALS OF CARE: Goal is to remain at home with his wife. He is a  2. PATIENT/CAREGIVER EDUCATION: Explained Palliative Care Services 3. DISEASE STATUS: Met with patient, wife, Antonio Manning, and son, Antonio Manning in patient's home. Upon arrival, he is sitting up in his recliner awake and alert. He denies pain at this time. He is O2 dependent, on 2L/min via [place]. He does have issues with dyspnea on exertion. His sats will drop on occasion to the upper 80s at times with exertion, but only for a short period of time. I educated patient on how to utilize his incentive spirometer. He requires 1 person assistance with bathing, dressing and transfers. Wife states that he was independent in November 2019, but has declined significantly since then. He is only able to take 2-5 steps with assistance. Wife gives patient sponge baths. His abdomen is firm and distended. He denies noticing any increases in his distention over the past week. His intake has been poor mainly, however yesterday family says patient ate fairly well. His medications are broken in half and given in applesauce d/t dysphagia. He is continent of both bowel and bladder. He uses a bed pan and urinal. He does have some edema noted to bilateral lower legs/feet, left > right. He has an order for Lasix, but has not taken this as of yet. Family and patient are concerned about Lasix causing him to release too much fluid, when his fluid intake is already not good. He does not want patient to become dehydrated. Legs are elevated on wedge pillow which  helps minimize edema. He recently completed antibiotics for an aspira. Wife says during this time he was experiencing diarrhea, which caused "diaper rash" to his bottom. They were using Zinc Oxide, but found they were having difficulty wiping it off when cleaning patient and felt they were causing more irritation doing this. Bottom is a dull red today and family says it looks much better today than it has been. Stools have been more formed today. Wife uses cornstarch underneath his abdominal folds and feels this keeps area dry and prevents irritation. He does have some difficulty sleeping during the night, but does take naps on and off during the day. Patient had a virtual visit with Dr. Burr Medico this am and she recommended Melatonin for sleep. He is currently receiving PT/OT/ST/RN through [facility]. Will continue to monitor.  HISTORY OF PRESENT ILLNESS:  This is a 78 yo male who resides at home with his wife. His son Antonio Manning has taken a leave of absence to help with patient care, but will be looking into private sitters for patient. Patient was recently hospitalized from 11/02/18 to 11/09/18 for sepsis due to aspiration pneumonia. SNF recommended, but son wanted to bring patient home. Palliative Care referral made for additional support. Next visit scheduled in 1 week.   CODE STATUS: Full Code ADVANCED DIRECTIVES: Y MOST FORM: no PPS: 30%   PHYSICAL EXAM:   VITALS: Today's Vitals   11/22/18 1346  BP: 103/78  Pulse: 65  Resp: 18  Temp: 97.9 F (36.6 C)  TempSrc: Temporal  SpO2: 95%  PainSc: 0-No pain    LUNGS: clear to auscultation  CARDIAC: Cor RRR EXTREMITIES: 1+ edema to lower left leg/foot, trace edema to right leg/foot SKIN: Dull redness noted to bottom, small skin lesion noted to midback covered with Allevyn pad for protection  NEURO: Alert and oriented x 3, generalized weakness, requires 1 person assistance with transfers   (Duration of visit and documentation 90  minutes)   Daryl Eastern, [name]

## 2018-11-24 NOTE — Telephone Encounter (Signed)
Received message that patient's son had called regarding a rash that patient has. Protective ointment had been applied, as well as cornstarch with no improvement. Message sent to primary palliative team to update.

## 2018-11-24 NOTE — Progress Notes (Signed)
COMMUNITY PALLIATIVE CARE RN NOTE  PATIENT NAME: Antonio Manning DOB: 1940-11-04 MRN: [id number]  PRIMARY CARE PROVIDER: Hulan Fess, MD  RESPONSIBLE PARTY:  Acct ID - Guarantor Home Phone Work Phone Relationship Acct Type  [id number] - Chicoine,Urian L [phone]  Self P/F     [address], [place], [place] [address]   Covid-19 Pre-screening Negative  PLAN OF CARE and INTERVENTION:  1. ADVANCE CARE PLANNING/GOALS OF CARE: Goal is for patient to remain at home with his wife. He is a Full Code. 2. PATIENT/CAREGIVER EDUCATION: Management of Skin Breakdown 3. DISEASE STATUS: PRN RN visit requested by patient's son Quita Skye. RN visit made. Son reports that they have noticed increased redness to patient's groin region, scrotum and buttocks folds. Area is very red and irritated and appears to be fungal in nature. During my last visit 2 days ago, area had become a dull red and seemed to be improving. Worsening redness/irritation noticed this morning. Contacted Palliative Care NP, Violeta Gelinas, who gave orders for Nystatin cream, apply to affected areas twice daily. I called this order into patient's pharmacy, [facility] Contacted [name] and advised that medication was called in so can be picked up today. Son appreciative of visit. Will continue to monitor.  HISTORY OF PRESENT ILLNESS:  This is a 78 yo male who resides at home with his wife. Palliative Care Team continues to follow patient. Next visit scheduled 11/29/18 at 1:00 pm.  CODE STATUS: Full Code ADVANCED DIRECTIVES: Y MOST FORM: no PPS: 30%  (Duration of visit and documentation 30 minutes)   Daryl Eastern, [name]

## 2018-11-24 NOTE — Telephone Encounter (Signed)
This encounter was created in error - please disregard.

## 2018-11-26 ENCOUNTER — Other Ambulatory Visit: Payer: Self-pay | Admitting: Hematology

## 2018-11-27 ENCOUNTER — Other Ambulatory Visit: Payer: Medicare Other | Admitting: *Deleted

## 2018-11-27 ENCOUNTER — Telehealth: Payer: Self-pay

## 2018-11-27 ENCOUNTER — Other Ambulatory Visit: Payer: Self-pay

## 2018-11-27 DIAGNOSIS — C229 Malignant neoplasm of liver, not specified as primary or secondary: Secondary | ICD-10-CM | POA: Diagnosis not present

## 2018-11-27 DIAGNOSIS — Z515 Encounter for palliative care: Secondary | ICD-10-CM

## 2018-11-27 DIAGNOSIS — A419 Sepsis, unspecified organism: Secondary | ICD-10-CM | POA: Diagnosis not present

## 2018-11-27 DIAGNOSIS — I110 Hypertensive heart disease with heart failure: Secondary | ICD-10-CM | POA: Diagnosis not present

## 2018-11-27 DIAGNOSIS — L22 Diaper dermatitis: Secondary | ICD-10-CM | POA: Diagnosis not present

## 2018-11-27 DIAGNOSIS — L89123 Pressure ulcer of left upper back, stage 3: Secondary | ICD-10-CM | POA: Diagnosis not present

## 2018-11-27 DIAGNOSIS — K922 Gastrointestinal hemorrhage, unspecified: Secondary | ICD-10-CM | POA: Diagnosis not present

## 2018-11-27 NOTE — Telephone Encounter (Signed)
Received message that patient's son called with concerns about patient's skin irritation that is causing patient discomfort. Primary Palliative Team made aware and to follow up

## 2018-11-28 ENCOUNTER — Telehealth: Payer: Self-pay | Admitting: *Deleted

## 2018-11-28 ENCOUNTER — Other Ambulatory Visit: Payer: Self-pay | Admitting: Nurse Practitioner

## 2018-11-28 DIAGNOSIS — I110 Hypertensive heart disease with heart failure: Secondary | ICD-10-CM | POA: Diagnosis not present

## 2018-11-28 DIAGNOSIS — C229 Malignant neoplasm of liver, not specified as primary or secondary: Secondary | ICD-10-CM | POA: Diagnosis not present

## 2018-11-28 DIAGNOSIS — L22 Diaper dermatitis: Secondary | ICD-10-CM | POA: Diagnosis not present

## 2018-11-28 DIAGNOSIS — K922 Gastrointestinal hemorrhage, unspecified: Secondary | ICD-10-CM | POA: Diagnosis not present

## 2018-11-28 DIAGNOSIS — A419 Sepsis, unspecified organism: Secondary | ICD-10-CM | POA: Diagnosis not present

## 2018-11-28 DIAGNOSIS — L89123 Pressure ulcer of left upper back, stage 3: Secondary | ICD-10-CM | POA: Diagnosis not present

## 2018-11-28 NOTE — Progress Notes (Signed)
COMMUNITY PALLIATIVE CARE RN NOTE  PATIENT NAME: Antonio Manning DOB: [birth year] MRN: [id number]  PRIMARY CARE PROVIDER: Hulan Fess, MD  RESPONSIBLE PARTY:  Acct ID - Guarantor Home Phone Work Phone Relationship Acct Type  [id number] - Fite,Elizardo L [phone]  Self P/F     [address], [place], [place] [address]   Covid-19 Pre-screening Negative  PLAN OF CARE and INTERVENTION:  1. ADVANCE CARE PLANNING/GOALS OF CARE: Goal is to remain at home with his wife.  2. PATIENT/CAREGIVER EDUCATION: Management of skin breakdown, Respiratory symptom management 3. DISEASE STATUS: Received a message that patient's son Quita Skye called regarding increased redness to patient's sacrum and is requesting RN visit. RN visit made. Patient sitting up in his recliner awake and alert. Remains able to answer questions and make needs known. Patient was started on Nystatin cream d/t yeast in folds around scrotum and his bottom. Yeast around scrotum has improved, however patient now has increased redness noted around his rectum, folds of his buttocks and coccyx.  Appears to be fungal. Wife has been using Nystatin cream to this area but she feels this area is getting worse. Contacted and spoke with Palliative Care NP, who recommended Miconazole cream to this area. She also stated that if area does not seem to improved in a few days, then patient may need an oral anti-fungal medication such as Diflucan. Will continue to monitor.   HISTORY OF PRESENT ILLNESS:  This is a 78 yo male who resides at home with his wife. His son's Quita Skye and Greely have also been helping in taking care of patient. Palliative Care team continues to follow patient. Next visit scheduled in 11/29/18.  CODE STATUS: Full Code  ADVANCED DIRECTIVES: Y MOST FORM: no PPS: 30%   PHYSICAL EXAM:   VITALS: Today's Vitals   11/27/18 1547  BP: 104/72  Pulse: (!) 59  Resp: 18  Temp: 97.7 F (36.5 C)  TempSrc: Temporal  SpO2: 99%  PainSc: 0-No pain     LUNGS: clear to auscultation  CARDIAC: Cor Loletha Grayer EXTREMITIES: Trace edema noted to bilateral lower extremities, L>R SKIN: Fungal skin infection noted to rectum, buttocks folds and coccys areas. Miconazole cream recommended  NEURO: Alert and oriented x 3, increased generalized weakness, requires 1 person assistance with transfers   (Duration of visit and documentation 75 minutes)    Daryl Eastern, [name]

## 2018-11-28 NOTE — Telephone Encounter (Signed)
Received vm call form V. Wicker/PT/[facility] that she saw pt today & notes some increased swelling in his fee/ankles & L hand (thumb area.  She reports pitting +3-4 in feet.  He has been taking 1/2 tab of lasix daily & instructed to increase to 1/d per bottle instructions & to call if no improvement.  She also reports that he c/o intermittent pain around his liver which is new.  Message routed to Dr Feng/Pod RN

## 2018-11-29 ENCOUNTER — Telehealth: Payer: Self-pay

## 2018-11-29 ENCOUNTER — Other Ambulatory Visit: Payer: Self-pay

## 2018-11-29 ENCOUNTER — Other Ambulatory Visit: Payer: Medicare Other | Admitting: *Deleted

## 2018-11-29 ENCOUNTER — Other Ambulatory Visit: Payer: Self-pay | Admitting: Hematology

## 2018-11-29 DIAGNOSIS — L89123 Pressure ulcer of left upper back, stage 3: Secondary | ICD-10-CM | POA: Diagnosis not present

## 2018-11-29 DIAGNOSIS — K922 Gastrointestinal hemorrhage, unspecified: Secondary | ICD-10-CM | POA: Diagnosis not present

## 2018-11-29 DIAGNOSIS — Z515 Encounter for palliative care: Secondary | ICD-10-CM

## 2018-11-29 DIAGNOSIS — L22 Diaper dermatitis: Secondary | ICD-10-CM | POA: Diagnosis not present

## 2018-11-29 DIAGNOSIS — C220 Liver cell carcinoma: Secondary | ICD-10-CM

## 2018-11-29 DIAGNOSIS — C229 Malignant neoplasm of liver, not specified as primary or secondary: Secondary | ICD-10-CM | POA: Diagnosis not present

## 2018-11-29 DIAGNOSIS — A419 Sepsis, unspecified organism: Secondary | ICD-10-CM | POA: Diagnosis not present

## 2018-11-29 DIAGNOSIS — I110 Hypertensive heart disease with heart failure: Secondary | ICD-10-CM | POA: Diagnosis not present

## 2018-11-29 NOTE — Telephone Encounter (Signed)
Monissa RN with Palliative Care calls stating she had a visit today.  The family is concerned that he may have fluid that needs to be drained.  His abdomen is distended, doesn't appear larger however fluid moving upward.  He is SOB occasionally but not in any acute distress.  She is wondering if Dr. Burr Medico would order a paracentesis.  Her [phone]

## 2018-11-29 NOTE — Telephone Encounter (Signed)
Yes, I will order it, please help to schedule. Thanks   [name] MD

## 2018-11-29 NOTE — Progress Notes (Signed)
COMMUNITY PALLIATIVE CARE RN NOTE  PATIENT NAME: Antonio Manning DOB: Jul 28, 1940 MRN: [id number]  PRIMARY CARE PROVIDER: Hulan Fess, MD  RESPONSIBLE PARTY: [name] (wife) Acct ID - Guarantor Home Phone Work Phone Relationship Acct Type  [id number] - Manning,Antonio L [phone]  Self P/F     [address], [place], [place] [address]   Covid-19 Pre-screening Negative  PLAN OF CARE and INTERVENTION:  1. ADVANCE CARE PLANNING/GOALS OF CARE: Goal is for patient to remain home with his wife. He is a Full Code. 2. PATIENT/CAREGIVER EDUCATION: Respiratory symptom management, Skin Breakdown management/prevention 3. DISEASE STATUS: Met with patient, wife, [name] and son, Quita Skye. Patient sitting up in his recliner. He remains alert and oriented and able to engage in appropriate conversation. He was seen by his Speech therapist today and his BP was low 89/79. During my visit it had improved to 114/83. He worked with PT/OT yesterday. He remains on Oxygen at 2L/min via [place]. His abdomen is tight and distended. It does not particularly appear larger in diameter, but appears fluid is moving upward towards diaphragm/chest. He is not currently in any acute distress, but dyspnea on exertion is an ongoing issue. At times he will wake up out of his sleep stating that he can't breathe. Son Quita Skye is questioning whether patient needs a paracentesis and feels that this may ease some pressure and help breathing to improve. He does not want to wait until patient is in immediate distress, he wants to have a plan in place and possibly have a paracentesis scheduled. Contacted cancer center and left a voicemail for Dr. Ernestina Penna nurse with this concern and my contact information. He does have increased edema noted to bilateral lower extremities, left > right. He has them elevated on a pillow. He did take a full 80m tablet of Lasix today and patient has been urinating. They feel he may be getting dehydrated and is pushing fluids. His fungal  rash to his bottom is improving with use of Miconazole powder. He does have two small open areas noted to each side of his coccyx. He has a cushion he sits on to help relieve pressure and family is changing his position to help. Will continue to monitor.    HISTORY OF PRESENT ILLNESS:  This is a 78yo male who resides at home with his wife. Palliative Care team continues to follow patient. Next visit scheduled in 1 week.   CODE STATUS: Full Code ADVANCED DIRECTIVES: Y MOST FORM: no PPS: 30%   PHYSICAL EXAM:   VITALS: Today's Vitals   11/29/18 1418  BP: 114/83  Pulse: 62  Resp: 18  Temp: 97.7 F (36.5 C)  TempSrc: Temporal  SpO2: 98%  PainSc: 0-No pain    LUNGS: clear to auscultation  CARDIAC: Cor RRR EXTREMITIES: pitting edema 4+ in left lower extremity, 3+ in right lower extremity SKIN: Redness/irration to groin area clear, fungal rash on bottom improving with Miconazole powder  NEURO: Alert and oriented x 3, generalized weakness, requires 1 person assistance with transfers   (Duration of visit and documentation 75 minutes)    MDaryl Eastern [name]

## 2018-11-29 NOTE — Telephone Encounter (Signed)
Antonio Manning with [facility] calls just to let Dr. Burr Medico know that his BP today was 89/79 asymptomatic.  Yesterday he did not drink very much fluid so the family is going to push fluids today.  Denies N/V/D.  This was an informational phone call.

## 2018-11-29 NOTE — Telephone Encounter (Signed)
Spoke with patient's son Quita Skye about his blood pressure, blood pressure was retaken by palliative care nurse and was 114/78.   Faxed signed orders to [facility] and sent to HIM for scan to chart.

## 2018-11-30 ENCOUNTER — Telehealth: Payer: Self-pay | Admitting: Hematology

## 2018-11-30 ENCOUNTER — Telehealth: Payer: Self-pay

## 2018-11-30 ENCOUNTER — Telehealth: Payer: Self-pay | Admitting: *Deleted

## 2018-11-30 NOTE — Telephone Encounter (Signed)
Received a voicemail from Dr. Ernestina Penna RN, Malachy Mood, stating that she received my voicemail from yesterday evening and that a paracentesis has been scheduled for patient on Monday, 12/04/18 at 11:00a. She advised she will contact patient's son, Quita Skye, and make him aware.

## 2018-11-30 NOTE — Telephone Encounter (Signed)
I called his son Antonio Manning back, he feels his father is improving some, eats little more. Due to his worsening abdominal distension, we have scheduled him a paracentesis for Monday 8/3.  We discussed what to expect at this terminal stage, I again reviewed the benefits of hospice, and residential hospice if he develops new symptoms. Questions were answered. Antonio Manning is very happy with the palliative care service, and would like to continue. His father is not ready for hospice yet. He asked if they can use EMS for transportation on Monday for his procedure at hospital. I will check tomorrow. He recently purchased a lift chair, and I want [place] to check if his insurance will cover it.  He will call back with the information. He appreciated the call.  [name]  11/30/2018

## 2018-11-30 NOTE — Telephone Encounter (Signed)
Spoke with patient's son about paracentesis scheduled for Monday 8/3 at 11:00 am arrive 10:45.    He has questions about his dad and what to expect.  I explained I would get Dr. Burr Medico to call him later today.  His [phone]

## 2018-12-01 ENCOUNTER — Telehealth: Payer: Self-pay | Admitting: *Deleted

## 2018-12-01 ENCOUNTER — Other Ambulatory Visit: Payer: Medicare Other | Admitting: Licensed Clinical Social Worker

## 2018-12-01 ENCOUNTER — Other Ambulatory Visit: Payer: Self-pay

## 2018-12-01 DIAGNOSIS — Z515 Encounter for palliative care: Secondary | ICD-10-CM

## 2018-12-01 NOTE — Progress Notes (Signed)
COMMUNITY PALLIATIVE CARE SW NOTE  PATIENT NAME: Antonio SEHGAL DOB: 12/13/1940 MRN: [id number]  PRIMARY CARE PROVIDER: Hulan Fess, MD  RESPONSIBLE PARTY:  Acct ID - Guarantor Home Phone Work Phone Relationship Acct Type  [id number] - [place], [place] [phone]  Self P/F     [address], [place], [place] [address]   Due to the COVID-19 crisis, this virtual check-in visit was done via telephone from my office and it was initiated and consent given by this patient and or family.   PLAN OF CARE and INTERVENTIONS:             1. GOALS OF CARE/ ADVANCE CARE PLANNING:  Goal is for patient to remain at home.  Patient is a full code. 2. SOCIAL/EMOTIONAL/SPIRITUAL ASSESSMENT/ INTERVENTIONS:  SW conducted a virtual check-in visit with patient's son, Quita Skye.  He stated patient has a paracentesis scheduled on Monday and wanted information regarding available transportation.  SW provided various options.  He said patient has recently ridden in his truck and he may transport patient that way.  [name] lives in [place], but is on medical leave.  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  Patient's other son and daughter work in [employer].  SW provided active listening and supportive counseling. 3. PATIENT/CAREGIVER EDUCATION/ COPING:  Son expresses his feelings openly. 4. PERSONAL EMERGENCY PLAN:  Family will contact patient's MD. 5. COMMUNITY RESOURCES COORDINATION/ HEALTH CARE NAVIGATION:  None. 6. FINANCIAL/LEGAL CONCERNS/INTERVENTIONS:  None.     SOCIAL HX:  Social History   Tobacco Use  . Smoking status: Former Smoker    Years: 25.00    Types: Cigarettes    Quit date: 05/12/1988    Years since quitting: 30.5  . Smokeless tobacco: Never Used  Substance Use Topics  . Alcohol use: No    Comment: he used to drink alcohol lightly and quit 50 years     CODE STATUS:  Full Code  ADVANCED DIRECTIVES:  Y MOST FORM COMPLETE:  N HOSPICE EDUCATION PROVIDED: N PPS:  Appetite was  not discussed.  [name] did say that he is bed ridden. Duration of visit and documentation:  45 minutes.      Creola Corn Brier Reid, LCSW

## 2018-12-01 NOTE — Telephone Encounter (Signed)
[place] Work  Clinical Social Work was referred by Futures trader for assessment of psychosocial needs.  Clinical Social Worker contacted patient's son to offer support and assess for needs.  Patient's son, Quita Skye, stated he scheduled [facility] transport to upcoming paracentesis appointment because patient is unable to sit in wheelchair due to abdominal distention.   CSW contacted patient to obtain medical necessity form that needs completion by provider.  CSW awaiting fax from [facility] and will provide to Dr. Burr Medico.    Gwinda Maine, LCSW  Clinical Social Worker [facility]

## 2018-12-01 NOTE — Patient Outreach (Signed)
[facility]) Care Management  12/01/2018  Zailyn Rowser Bowermaster [birth year] [id number]   Successful follow up call to patient's spouse today.  She confirmed receipt of Home Care Directory that was mailed on 11/17/18, however, she is still wanting to get information from a family friend about the agency that she utilized before her husbands passing.  Palliative care is now involved with patient.  BSW is closing case but encouraged her to call if additional resource needs arise.  CSW, [name], remains involved at this time.  Ronn Melena, BSW Social Worker [phone]

## 2018-12-04 ENCOUNTER — Ambulatory Visit (HOSPITAL_COMMUNITY): Payer: Medicare Other

## 2018-12-04 ENCOUNTER — Telehealth: Payer: Self-pay

## 2018-12-04 ENCOUNTER — Encounter: Payer: Medicare Other | Admitting: *Deleted

## 2018-12-04 NOTE — Telephone Encounter (Signed)
Received call from [place] [facility] to cancel appointment for paracentesis today.  Called central scheduling to cancel.

## 2018-12-05 ENCOUNTER — Telehealth: Payer: Self-pay | Admitting: *Deleted

## 2018-12-05 DIAGNOSIS — K922 Gastrointestinal hemorrhage, unspecified: Secondary | ICD-10-CM | POA: Diagnosis not present

## 2018-12-05 DIAGNOSIS — A419 Sepsis, unspecified organism: Secondary | ICD-10-CM | POA: Diagnosis not present

## 2018-12-05 DIAGNOSIS — L22 Diaper dermatitis: Secondary | ICD-10-CM | POA: Diagnosis not present

## 2018-12-05 DIAGNOSIS — L89123 Pressure ulcer of left upper back, stage 3: Secondary | ICD-10-CM | POA: Diagnosis not present

## 2018-12-05 DIAGNOSIS — I110 Hypertensive heart disease with heart failure: Secondary | ICD-10-CM | POA: Diagnosis not present

## 2018-12-05 DIAGNOSIS — C229 Malignant neoplasm of liver, not specified as primary or secondary: Secondary | ICD-10-CM | POA: Diagnosis not present

## 2018-12-05 NOTE — Telephone Encounter (Signed)
This is fine with me, thanks   Antonio Merle MD

## 2018-12-05 NOTE — Telephone Encounter (Signed)
Received call from pt's speech therapist.   She states his swallowing is improving nicely and overall doing well. She is requesting to change her visit orders to every other week x 2. Approval for this given. No other questions or concerns

## 2018-12-06 ENCOUNTER — Other Ambulatory Visit: Payer: Self-pay | Admitting: *Deleted

## 2018-12-06 ENCOUNTER — Other Ambulatory Visit: Payer: Medicare Other | Admitting: *Deleted

## 2018-12-06 ENCOUNTER — Ambulatory Visit: Payer: Medicare Other | Admitting: Hematology

## 2018-12-06 ENCOUNTER — Ambulatory Visit: Payer: Medicare Other

## 2018-12-06 ENCOUNTER — Telehealth: Payer: Medicare Other | Admitting: Internal Medicine

## 2018-12-06 ENCOUNTER — Encounter: Payer: Self-pay | Admitting: *Deleted

## 2018-12-06 ENCOUNTER — Other Ambulatory Visit: Payer: Self-pay

## 2018-12-06 ENCOUNTER — Other Ambulatory Visit: Payer: Medicare Other

## 2018-12-06 ENCOUNTER — Ambulatory Visit: Payer: Medicare Other | Admitting: *Deleted

## 2018-12-06 DIAGNOSIS — I110 Hypertensive heart disease with heart failure: Secondary | ICD-10-CM | POA: Diagnosis not present

## 2018-12-06 DIAGNOSIS — Z515 Encounter for palliative care: Secondary | ICD-10-CM | POA: Diagnosis not present

## 2018-12-06 DIAGNOSIS — C229 Malignant neoplasm of liver, not specified as primary or secondary: Secondary | ICD-10-CM | POA: Diagnosis not present

## 2018-12-06 DIAGNOSIS — A419 Sepsis, unspecified organism: Secondary | ICD-10-CM | POA: Diagnosis not present

## 2018-12-06 DIAGNOSIS — L89123 Pressure ulcer of left upper back, stage 3: Secondary | ICD-10-CM | POA: Diagnosis not present

## 2018-12-06 DIAGNOSIS — K922 Gastrointestinal hemorrhage, unspecified: Secondary | ICD-10-CM | POA: Diagnosis not present

## 2018-12-06 DIAGNOSIS — L22 Diaper dermatitis: Secondary | ICD-10-CM | POA: Diagnosis not present

## 2018-12-06 NOTE — Progress Notes (Signed)
COMMUNITY PALLIATIVE CARE RN NOTE  PATIENT NAME: Antonio Manning DOB: [birth year] MRN: [id number]  PRIMARY CARE PROVIDER: Hulan Fess, MD  RESPONSIBLE PARTY: [name] (wife) Acct ID - Guarantor Home Phone Work Phone Relationship Acct Type  [id number] - Antonio Manning,Antonio Manning [phone]  Self P/F     [address], [place], [place] [address]   Covid-19 Pre-screening Negative  PLAN OF CARE and INTERVENTION:  1. ADVANCE CARE PLANNING/GOALS OF CARE: Goal is for patient to get stronger and remain at home with his wife. 2. PATIENT/CAREGIVER EDUCATION: Management of Skin breakdown, Safe Mobility 3. DISEASE STATUS: Met with patient, wife, [name], and son, Quita Skye in patient's home. He is lying back in his recliner awake and alert. He remains able to engage in appropriate conversation. He denies pain. He worked with Physical therapy today and says that he is getting stronger. PT had him working on strengthening exercises and he was able to ambulate to walk form his chair to the door and back. He was able to go out for a ride in his car yesterday with both sons. His intake is improving. His swallowing is improving and he continues to take his medications broken in half in applesauce. He decided to have his paracentesis cancelled that was scheduled Monday 8/3 because he felt that he didn't need it just yet. He has been taking his Lasix 20 mg daily with good urinary output. He has not been getting as short of breath with activity. He remains on Oxygen at 2L/min via [place]. He has a small open wound noted to his Manning buttocks and a healing wound on the R buttocks. Both 0.5 x 0.5 cm in diameter. Areas healing with application of allevyn pads. Fungal skin infection much improved with continued Miconazole powder use. Discussed different techniques to pull patient up in his recliner to prevent shearing and making sure that he is changing positions frequently throughout the day. Will continue to monitor.   HISTORY OF PRESENT ILLNESS:  This is a 78 yo male who resides at home with his wife. He continues with a home health PT/OT/ST/RN. Palliative care team continues to follow patient. Next visit in 1 week.    CODE STATUS: Full Code ADVANCED DIRECTIVES: Y MOST FORM: no PPS: 40%   PHYSICAL EXAM:   VITALS: Today's Vitals   12/06/18 1326  BP: 112/74  Pulse: (!) 53  Resp: 16  Temp: (!) 97 F (36.1 C)  TempSrc: Temporal  SpO2: 97%  PainSc: 0-No pain    LUNGS: clear to auscultation  CARDIAC: Cor Loletha [place] EXTREMITIES: 3+ edema SKIN: 0.5 cm x 0.5 cm wound noted to each buttocks, fungal infection improving with Miconazole powder  NEURO: Alert and oriented x 3, pleasant mood, generalized weakness, ambulatory w/walker   (Duration of visit and documentation 75 minutes)    Daryl Eastern, [name]

## 2018-12-06 NOTE — Patient Outreach (Signed)
[facility]) Care Management  12/06/2018  Antonio Manning 18-May-1940 [id number]   Received forward voicemail message from [place] at [facility] from patient's wife / designated party release Antonio Sauer [place], requesting call regarding needing assistance with obtaining home health aide.  Telephone call to patient's home number, spoke with patient's wife Antonio Manning), she stated patient's name, date of birth, and address.  Advised wife returning call on behalf of [name] who is currently out of the office, wife voiced understanding, and is in agreement to follow up with this RNCM.  States she was wondering if patient would be eligible to receive home health aide covered by insurance after talking with a family friend's relative who had received aide services in the past.   Wife states she is patient's primary caregiver, she has her own health issues, and she is requesting additional care assistance for patient.   States their children are also assisting them as needed.   Discussed home health aide covered is determined by skilled need, home health provider criteria,  insurance criteria, wife voices understanding, states she has already made a request today to patient's palliative care agency, and will also follow up with patient's home health agency to request home health aide if patient is eligible. States patient is currently receiving the following home health services through [name], nursing, physical therapy, occupational therapy, speech therapy.    States patient is receiving nursing and social worker services through palliative care agency.  Discussed how to access private duty aide services, wife voiced understanding, states she has already discussed this option with social worker, and will follow up as needed.  Wife states patient does not have any education material, EMMI follow up, care coordination, care management, disease monitoring,  transportation, community resource, or pharmacy needs from [name] at this time.   States she is very appreciative of the follow up and patient will continue to receive palliative care, and home health services.  Case will remain closed and in basket update sent to [name] at [name].      [name] H. Annia Friendly, BSN, [facility] [place] Telephonic CM Phone: [phone] Fax: [phone]

## 2018-12-06 NOTE — Patient Outreach (Signed)
[facility]) Care Management  12/06/2018  Koen Antilla Brayboy 1940/09/27 [id number]   CSW was able to make contact with patient's wife, Donaldo Teegarden today to follow-up regarding social work services and resources for patient.  Mrs. Giovanni reported that patient is "getting better", indicating that he walked several feet yesterday with the assistance of his rolling walker.  Mrs. Ericsson went on to say that she has been transporting patient, via his wheelchair, out into the front yard to enjoy the fresh air.  Mrs. Vlachos admitted that patient continues to receive home health services through [facility], which includes physical therapy, occupational therapy, speech therapy and nursing services.  Mrs. Kable recently put in a request for a home health aide.  Patient is also receiving [place], which entitles him to a nurse and Education officer, museum.  Mrs. Mcalpine reported that the plan is for patient to remain at home to live with her, for the duration of his life.  Mrs. Ognibene indicated that patient was experiencing some abdominal distension, for which a Paracentesis was scheduled for Monday, December 04, 2018 at 11:00AM.  However, patient's son, Edvin Albus called and cancelled the appointment on the 3rd of August around 8:21AM.  Despite patient's terminal stage, Mrs. Falco continues to refuse hospice services and would like for patient to remain a full code.  Mrs. Mccolm commented that she is pleased with the services that patient is receiving through [facility].  Mrs. Rawdon stated that she recently purchased a lift chair for patient, aiding in his ability to sit and stand with minimal assistance.  Mrs. Tieken admitted that she was not aware of a virtual visit that took place on Friday, December 01, 2018 at 11:30AM, between her son, Issak Goley and the [place] Social Worker, [place].  CSW encouraged Mrs. Ingber to converse with Quita Skye, realizing that there is a great deal of family  discord present in the home.  Quita Skye has reported that Mrs. Sumler is in great denial, "not wanting to accept the fact that patient's condition is terminal".  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  Quita Skye has verbalized to CSW that he believes that he needs to be present in the home to ensure that patient receives the best care possible.  Mrs. Burnside denied the need for additional social work services through [facility] with Scientist, clinical (histocompatibility and immunogenetics), verbalizing that patient is already receiving "enough services".  Mrs. Mahabir went on to say that there are already "too many people coming into her home", putting her at greater risk for contracting COVID-19.  Mrs. Redmann also stated that she does not believe that patient would benefit from telephonic counseling and supportive services through [facility], indicating that patient already has all the support he needs through family members and friends.  Mrs. Karen admitted that her main focus at this point is to get the sores on patient's buttocks area healed.  CSW is aware that patient has been prescribed a topical ointment for the management of his skin breakdown and uses a cushion to relieve the pressure.  CSW will perform a case closure on patient, due to patient now being enrolled into an external program ([facility]), as well as Mrs. Gutter's request for CSW to terminate services.  CSW will fax an update to patient's Primary Care  Physician, Dr. Hulan Fess to ensure that they are aware of CSW's involvement with patient's plan of care.  CSW was able to confirm that Mrs. Bartnik has the correct contact information for CSW, encouraging Mrs. Artiaga, and Quita Skye, to contact CSW directly if additional social work needs arise in the near future, or if Mrs. Overstreet changes her mind about patient receiving telephonic  counseling and supportive services through CSW.  Mrs. Schueller voiced understanding and was agreeable to this plan.   [name], BSW, MSW, LCSW  Licensed Education officer, [facility]  Mailing [place] [address], [place], [place] [address] Physical Address-[address], [place], [place] [address] Toll Free Main # [phone] Fax # [phone] Cell # [phone]  Office # [phone] [email or web address]

## 2018-12-07 ENCOUNTER — Other Ambulatory Visit: Payer: Self-pay | Admitting: Internal Medicine

## 2018-12-07 ENCOUNTER — Other Ambulatory Visit: Payer: Self-pay | Admitting: Cardiovascular Disease

## 2018-12-07 ENCOUNTER — Telehealth: Payer: Self-pay

## 2018-12-07 ENCOUNTER — Other Ambulatory Visit: Payer: Self-pay

## 2018-12-07 DIAGNOSIS — C220 Liver cell carcinoma: Secondary | ICD-10-CM

## 2018-12-07 MED ORDER — PANTOPRAZOLE SODIUM 40 MG PO TBEC: 40.0000 mg | DELAYED_RELEASE_TABLET | Freq: Two times a day (BID) | ORAL | 1 refills | Status: DC

## 2018-12-07 NOTE — Telephone Encounter (Signed)
New Message    *STAT* If patient is at the pharmacy, call can be transferred to refill team.   1. Which medications need to be refilled? (please list name of each medication and dose if known) ELIQUIS 5 MG TABS tablet  2. Which pharmacy/location (including street and city if local pharmacy) is medication to be sent to? [id number] [place], [place] - [address]  3. Do they need a 30 day or 90 day supply? 90 day supply

## 2018-12-07 NOTE — Telephone Encounter (Signed)
Faxed signed order back to [facility], sent to HIM for scan to chart.

## 2018-12-08 MED ORDER — APIXABAN 5 MG PO TABS: 5.0000 mg | ORAL_TABLET | Freq: Two times a day (BID) | ORAL | 1 refills | Status: AC

## 2018-12-08 NOTE — Telephone Encounter (Signed)
63M 67.2 kg, SCr 0.49 (11/2018), LOV LK 07/2018

## 2018-12-11 ENCOUNTER — Other Ambulatory Visit: Payer: Self-pay

## 2018-12-11 ENCOUNTER — Other Ambulatory Visit: Payer: Medicare Other | Admitting: *Deleted

## 2018-12-11 ENCOUNTER — Telehealth: Payer: Self-pay | Admitting: *Deleted

## 2018-12-11 ENCOUNTER — Telehealth: Payer: Self-pay

## 2018-12-11 DIAGNOSIS — A419 Sepsis, unspecified organism: Secondary | ICD-10-CM | POA: Diagnosis not present

## 2018-12-11 DIAGNOSIS — K922 Gastrointestinal hemorrhage, unspecified: Secondary | ICD-10-CM | POA: Diagnosis not present

## 2018-12-11 DIAGNOSIS — L89123 Pressure ulcer of left upper back, stage 3: Secondary | ICD-10-CM | POA: Diagnosis not present

## 2018-12-11 DIAGNOSIS — Z515 Encounter for palliative care: Secondary | ICD-10-CM

## 2018-12-11 DIAGNOSIS — L22 Diaper dermatitis: Secondary | ICD-10-CM | POA: Diagnosis not present

## 2018-12-11 DIAGNOSIS — C229 Malignant neoplasm of liver, not specified as primary or secondary: Secondary | ICD-10-CM | POA: Diagnosis not present

## 2018-12-11 DIAGNOSIS — I110 Hypertensive heart disease with heart failure: Secondary | ICD-10-CM | POA: Diagnosis not present

## 2018-12-11 NOTE — Telephone Encounter (Signed)
Received a phone call from patient's wife, [name], requesting a return call. Contacted and spoke with wife and she is requesting a RN visit today to assess redness to his bottom, pressure sores and a new skin tear that occurred over the weekend. Visit scheduled for 1:00pm today.

## 2018-12-11 NOTE — Telephone Encounter (Signed)
Patient's daughter Juliann Pulse calls wanting to know if he still needs to take Metoprolol and Amiodarone. These were prescribed by Dr. Nevada Crane while he was in the hospital.  [phone]

## 2018-12-12 ENCOUNTER — Telehealth: Payer: Self-pay | Admitting: *Deleted

## 2018-12-12 ENCOUNTER — Other Ambulatory Visit: Payer: Self-pay | Admitting: Hematology

## 2018-12-12 DIAGNOSIS — C229 Malignant neoplasm of liver, not specified as primary or secondary: Secondary | ICD-10-CM | POA: Diagnosis not present

## 2018-12-12 DIAGNOSIS — I110 Hypertensive heart disease with heart failure: Secondary | ICD-10-CM | POA: Diagnosis not present

## 2018-12-12 DIAGNOSIS — K2970 Gastritis, unspecified, without bleeding: Secondary | ICD-10-CM | POA: Diagnosis not present

## 2018-12-12 DIAGNOSIS — L89123 Pressure ulcer of left upper back, stage 3: Secondary | ICD-10-CM | POA: Diagnosis not present

## 2018-12-12 DIAGNOSIS — R1310 Dysphagia, unspecified: Secondary | ICD-10-CM | POA: Diagnosis not present

## 2018-12-12 DIAGNOSIS — E785 Hyperlipidemia, unspecified: Secondary | ICD-10-CM | POA: Diagnosis not present

## 2018-12-12 DIAGNOSIS — A419 Sepsis, unspecified organism: Secondary | ICD-10-CM | POA: Diagnosis not present

## 2018-12-12 DIAGNOSIS — I480 Paroxysmal atrial fibrillation: Secondary | ICD-10-CM | POA: Diagnosis not present

## 2018-12-12 DIAGNOSIS — Z8673 Personal history of transient ischemic attack (TIA), and cerebral infarction without residual deficits: Secondary | ICD-10-CM | POA: Diagnosis not present

## 2018-12-12 DIAGNOSIS — M7989 Other specified soft tissue disorders: Secondary | ICD-10-CM

## 2018-12-12 DIAGNOSIS — E039 Hypothyroidism, unspecified: Secondary | ICD-10-CM | POA: Diagnosis not present

## 2018-12-12 DIAGNOSIS — I429 Cardiomyopathy, unspecified: Secondary | ICD-10-CM | POA: Diagnosis not present

## 2018-12-12 DIAGNOSIS — Z87891 Personal history of nicotine dependence: Secondary | ICD-10-CM | POA: Diagnosis not present

## 2018-12-12 DIAGNOSIS — J9601 Acute respiratory failure with hypoxia: Secondary | ICD-10-CM | POA: Diagnosis not present

## 2018-12-12 DIAGNOSIS — Z96642 Presence of left artificial hip joint: Secondary | ICD-10-CM | POA: Diagnosis not present

## 2018-12-12 DIAGNOSIS — I5043 Acute on chronic combined systolic (congestive) and diastolic (congestive) heart failure: Secondary | ICD-10-CM | POA: Diagnosis not present

## 2018-12-12 DIAGNOSIS — K2210 Ulcer of esophagus without bleeding: Secondary | ICD-10-CM | POA: Diagnosis not present

## 2018-12-12 DIAGNOSIS — K922 Gastrointestinal hemorrhage, unspecified: Secondary | ICD-10-CM | POA: Diagnosis not present

## 2018-12-12 DIAGNOSIS — L22 Diaper dermatitis: Secondary | ICD-10-CM | POA: Diagnosis not present

## 2018-12-12 DIAGNOSIS — D62 Acute posthemorrhagic anemia: Secondary | ICD-10-CM | POA: Diagnosis not present

## 2018-12-12 NOTE — Telephone Encounter (Signed)
Received call from [facility] asking for refill on pt's amiodarone & metoprolol that were last filled by hospitalist.  He has 2 days worth left.  Informed that message would be left with MD/NP for OK to fill.

## 2018-12-12 NOTE — Telephone Encounter (Signed)
Please call him back for me. Pt is not on chemo now, if he wants to try Propolis, I am fine with that, but I do not have enough knowledge to recommend taking it or avoid it. Thanks  [name]

## 2018-12-12 NOTE — Telephone Encounter (Signed)
Informed son, Quita Skye of Dr Ernestina Penna comments & suggested he talk with pharmacist to see if they have any comments.  He asked if I could have one of our pharmacist contact him & informed that I would send a message to someone to see if they would investigate.

## 2018-12-12 NOTE — Telephone Encounter (Signed)
Received call from pt's son, Quita Skye stating that his brother had talked with Dr Burr Medico about trying Propolis & she was concerned about bleeding.  He would like to discuss again since pt is not receiving any treatment at this time & knows that this is not going to be cure but could it possibly help some?  Please call him at [phone].  Message routed to Dr Burr Medico.

## 2018-12-13 DIAGNOSIS — C229 Malignant neoplasm of liver, not specified as primary or secondary: Secondary | ICD-10-CM | POA: Diagnosis not present

## 2018-12-13 DIAGNOSIS — K922 Gastrointestinal hemorrhage, unspecified: Secondary | ICD-10-CM | POA: Diagnosis not present

## 2018-12-13 DIAGNOSIS — L89123 Pressure ulcer of left upper back, stage 3: Secondary | ICD-10-CM | POA: Diagnosis not present

## 2018-12-13 DIAGNOSIS — A419 Sepsis, unspecified organism: Secondary | ICD-10-CM | POA: Diagnosis not present

## 2018-12-13 DIAGNOSIS — L22 Diaper dermatitis: Secondary | ICD-10-CM | POA: Diagnosis not present

## 2018-12-13 DIAGNOSIS — I110 Hypertensive heart disease with heart failure: Secondary | ICD-10-CM | POA: Diagnosis not present

## 2018-12-14 ENCOUNTER — Other Ambulatory Visit: Payer: Self-pay | Admitting: Hematology

## 2018-12-14 ENCOUNTER — Telehealth: Payer: Self-pay

## 2018-12-14 DIAGNOSIS — M7989 Other specified soft tissue disorders: Secondary | ICD-10-CM

## 2018-12-14 NOTE — Telephone Encounter (Signed)
Faxed sign orders back to [facility], sent to HIM for scan to chart.

## 2018-12-14 NOTE — Progress Notes (Signed)
COMMUNITY PALLIATIVE CARE RN NOTE  PATIENT NAME: Antonio Manning DOB: 03-19-1941 MRN: [id number]  PRIMARY CARE PROVIDER: Hulan Fess, MD  RESPONSIBLE PARTY:  Acct ID - Guarantor Home Phone Work Phone Relationship Acct Type  [id number] - [place],Antonio Manning  Self P/F     [address], [place], [place] [address]   Covid-19 Pre-screening Negative  PLAN OF CARE and INTERVENTION:  1. ADVANCE CARE PLANNING/GOALS OF CARE: Goal is to remain at home with his wife. He is a Full Code. 2. PATIENT/CAREGIVER EDUCATION: Management of Skin Breakdown and edema 3. DISEASE STATUS: Met with patient, wife, [name] and son, Quita Skye in their home. Patient sitting up in his new recliner awake and alert. He remains able to engage in appropriate conversation. He reports feeling ok. He denies any pain. Son feels that patient's breathing is getting slightly worse with exertion and noticing increased edema in bilateral lower extremities R>L, and wondered if his Lasix should be increased. Patient is currently taking Lasix 20 mg po daily. I continue to encourage patient to elevate his extremities as much as possible while sitting, but often times he does not want to do so. We discussed trying compression stockings, and family to look into getting some. No dyspnea noted during visit and patient states that he takes off his oxygen more often while sitting. O2 sats today on room air 90-91%. He continues to work with PT/OT/ST and has a Occupational hygienist visiting as well. He is more ambulatory using his walker and his sons have been able to take him outside and for rides in the car. Some redness noted to R heel. Recommend using skin prep daily and will contact [facility] RN to see if she would be able to order this for patient. The redness is clearing to his bottom, wife applying Miconazole powder daily. He has 2 small unchanged open area to his bottom, covered with Allevyn pads for protection. Over the weekend, he sustained a skin tear to his R  elbow region. He says he think his skin got caught on his shirt somehow. Recommended cleansing with wound cleanser, applying antibiotic ointment and keeping it covered with a bandage for now. He also has a scab that came off on the outer aspect of his R leg near his knee. Recommended same treatment as skin tear.  Communication sent to Dr. Burr Medico regarding edema and also low HR readings. Patient was prescribed Amiodarone and Metoprolol during his last hospitalization by the hospitalist and he needs a refill. This information communicated with Dr. Burr Medico who said she will consult with his Cardiologist. She wants patient to try compression stockings first vs increasing Lasix. Patient had some small drops of blood noted with urination over the weekend. None today. Unsure of cause. Denies dysuria and urine does not have a foul odor. Family advised this has occurred in the past. Will continue to monitor.  HISTORY OF PRESENT ILLNESS: This is a 78 yo male who resides at home with his wife. He has a very supportive family. Palliative Care team continues to follow patient. Will continue to visit patient monthly and PRN.   CODE STATUS: Full Code ADVANCED DIRECTIVES: Y MOST FORM: no PPS: 40%   PHYSICAL EXAM:   VITALS: Today's Vitals   12/11/18 1403  BP: 123/83  Pulse: (!) 50  Resp: 18  Temp: 97.9 F (36.6 C)  TempSrc: Temporal  SpO2: 91%  PainSc: 0-No pain    LUNGS: clear to auscultation  CARDIAC: Cor Brady EXTREMITIES: 2+ edema  left lower extremity, 4+ edema to right lower extremity but more profound in ankle and foot SKIN: Noted above  NEURO: Alert and oriented 3 x, pleasant mood, generalized weakness, ambulatory with walker    (Duration of visit and documentation 75 minutes)   Daryl Eastern, [name]

## 2018-12-18 DIAGNOSIS — K922 Gastrointestinal hemorrhage, unspecified: Secondary | ICD-10-CM | POA: Diagnosis not present

## 2018-12-18 DIAGNOSIS — L22 Diaper dermatitis: Secondary | ICD-10-CM | POA: Diagnosis not present

## 2018-12-18 DIAGNOSIS — I110 Hypertensive heart disease with heart failure: Secondary | ICD-10-CM | POA: Diagnosis not present

## 2018-12-18 DIAGNOSIS — C229 Malignant neoplasm of liver, not specified as primary or secondary: Secondary | ICD-10-CM | POA: Diagnosis not present

## 2018-12-18 DIAGNOSIS — A419 Sepsis, unspecified organism: Secondary | ICD-10-CM | POA: Diagnosis not present

## 2018-12-18 DIAGNOSIS — L89123 Pressure ulcer of left upper back, stage 3: Secondary | ICD-10-CM | POA: Diagnosis not present

## 2018-12-20 ENCOUNTER — Other Ambulatory Visit: Payer: Medicare Other

## 2018-12-20 ENCOUNTER — Other Ambulatory Visit: Payer: Self-pay

## 2018-12-20 DIAGNOSIS — Z515 Encounter for palliative care: Secondary | ICD-10-CM

## 2018-12-20 NOTE — Progress Notes (Signed)
Received call from patient's wife requesting visit to assess concerns of skin breakdown. Patient out of bed to recliner. Verbally responsive to questions but did not offer conversation. Patient denied any pain. No evidence of respiratory distress. Wife reported that patient had complained of soreness to perirectal area. Skin is intact without redness. Sacral area and right side of buttocks no longer open. Noted area on left side of buttocks that wife reported was new. Area is circular, clean with out any drainage noted. Area cleansed and patch applied. Reinforced education for patient to change positions to alleviate pressure and help prevent further breakdown. Patient and wife expressed understanding

## 2018-12-21 DIAGNOSIS — L89123 Pressure ulcer of left upper back, stage 3: Secondary | ICD-10-CM | POA: Diagnosis not present

## 2018-12-21 DIAGNOSIS — K922 Gastrointestinal hemorrhage, unspecified: Secondary | ICD-10-CM | POA: Diagnosis not present

## 2018-12-21 DIAGNOSIS — I110 Hypertensive heart disease with heart failure: Secondary | ICD-10-CM | POA: Diagnosis not present

## 2018-12-21 DIAGNOSIS — L22 Diaper dermatitis: Secondary | ICD-10-CM | POA: Diagnosis not present

## 2018-12-21 DIAGNOSIS — C229 Malignant neoplasm of liver, not specified as primary or secondary: Secondary | ICD-10-CM | POA: Diagnosis not present

## 2018-12-21 DIAGNOSIS — A419 Sepsis, unspecified organism: Secondary | ICD-10-CM | POA: Diagnosis not present

## 2018-12-26 ENCOUNTER — Telehealth: Payer: Self-pay | Admitting: *Deleted

## 2018-12-26 NOTE — Telephone Encounter (Signed)
Per Regan Rakers NP, faxed documents to [facility] for OT orders. Confirmation received from fax

## 2018-12-27 ENCOUNTER — Telehealth: Payer: Self-pay

## 2018-12-27 DIAGNOSIS — L22 Diaper dermatitis: Secondary | ICD-10-CM | POA: Diagnosis not present

## 2018-12-27 DIAGNOSIS — K922 Gastrointestinal hemorrhage, unspecified: Secondary | ICD-10-CM | POA: Diagnosis not present

## 2018-12-27 DIAGNOSIS — I110 Hypertensive heart disease with heart failure: Secondary | ICD-10-CM | POA: Diagnosis not present

## 2018-12-27 DIAGNOSIS — A419 Sepsis, unspecified organism: Secondary | ICD-10-CM | POA: Diagnosis not present

## 2018-12-27 DIAGNOSIS — C229 Malignant neoplasm of liver, not specified as primary or secondary: Secondary | ICD-10-CM | POA: Diagnosis not present

## 2018-12-27 DIAGNOSIS — L89123 Pressure ulcer of left upper back, stage 3: Secondary | ICD-10-CM | POA: Diagnosis not present

## 2018-12-27 NOTE — Telephone Encounter (Signed)
Faxed signed orders back to [facility] for OT, sent to HIM for scanning to chart.

## 2018-12-28 ENCOUNTER — Telehealth: Payer: Self-pay | Admitting: *Deleted

## 2018-12-28 ENCOUNTER — Other Ambulatory Visit: Payer: Self-pay | Admitting: Hematology

## 2018-12-28 DIAGNOSIS — C220 Liver cell carcinoma: Secondary | ICD-10-CM

## 2018-12-28 NOTE — Telephone Encounter (Signed)
Received a call from patient's wife, [name], stating that she feels the patient is starting to decline again. He is sleeping more overall, is weaker and had a recent fall in the driveway. She is requesting a RN visit. Visit scheduled for Friday, 12/29/18 at 2:30p.

## 2018-12-29 ENCOUNTER — Other Ambulatory Visit: Payer: Self-pay

## 2018-12-29 ENCOUNTER — Other Ambulatory Visit: Payer: Medicare Other | Admitting: *Deleted

## 2018-12-29 ENCOUNTER — Other Ambulatory Visit: Payer: Self-pay | Admitting: Hematology

## 2018-12-29 DIAGNOSIS — K922 Gastrointestinal hemorrhage, unspecified: Secondary | ICD-10-CM | POA: Diagnosis not present

## 2018-12-29 DIAGNOSIS — C220 Liver cell carcinoma: Secondary | ICD-10-CM

## 2018-12-29 DIAGNOSIS — L22 Diaper dermatitis: Secondary | ICD-10-CM | POA: Diagnosis not present

## 2018-12-29 DIAGNOSIS — Z515 Encounter for palliative care: Secondary | ICD-10-CM | POA: Diagnosis not present

## 2018-12-29 DIAGNOSIS — C229 Malignant neoplasm of liver, not specified as primary or secondary: Secondary | ICD-10-CM | POA: Diagnosis not present

## 2018-12-29 DIAGNOSIS — L89123 Pressure ulcer of left upper back, stage 3: Secondary | ICD-10-CM | POA: Diagnosis not present

## 2018-12-29 DIAGNOSIS — A419 Sepsis, unspecified organism: Secondary | ICD-10-CM | POA: Diagnosis not present

## 2018-12-29 DIAGNOSIS — I110 Hypertensive heart disease with heart failure: Secondary | ICD-10-CM | POA: Diagnosis not present

## 2019-01-01 ENCOUNTER — Telehealth: Payer: Self-pay

## 2019-01-01 ENCOUNTER — Other Ambulatory Visit: Payer: Medicare Other | Admitting: *Deleted

## 2019-01-01 ENCOUNTER — Other Ambulatory Visit: Payer: Self-pay

## 2019-01-01 DIAGNOSIS — K922 Gastrointestinal hemorrhage, unspecified: Secondary | ICD-10-CM | POA: Diagnosis not present

## 2019-01-01 DIAGNOSIS — I110 Hypertensive heart disease with heart failure: Secondary | ICD-10-CM | POA: Diagnosis not present

## 2019-01-01 DIAGNOSIS — L89123 Pressure ulcer of left upper back, stage 3: Secondary | ICD-10-CM | POA: Diagnosis not present

## 2019-01-01 DIAGNOSIS — Z515 Encounter for palliative care: Secondary | ICD-10-CM

## 2019-01-01 DIAGNOSIS — L22 Diaper dermatitis: Secondary | ICD-10-CM | POA: Diagnosis not present

## 2019-01-01 DIAGNOSIS — A419 Sepsis, unspecified organism: Secondary | ICD-10-CM | POA: Diagnosis not present

## 2019-01-01 DIAGNOSIS — C229 Malignant neoplasm of liver, not specified as primary or secondary: Secondary | ICD-10-CM | POA: Diagnosis not present

## 2019-01-01 NOTE — Telephone Encounter (Signed)
Faxed signed orders back to [facility] for PT, sent to HIM for scan to chart.

## 2019-01-01 NOTE — Progress Notes (Signed)
COMMUNITY PALLIATIVE CARE RN NOTE  PATIENT NAME: Antonio Manning DOB: 09-09-1940 MRN: [id number]  PRIMARY CARE PROVIDER: Hulan Fess, MD  RESPONSIBLE PARTY: Mickel Duhamel (wife) Acct ID - Guarantor Home Phone Work Phone Relationship Acct Type  [id number] - Storey,Moosa L [phone]  Self P/F     [address], [place], [place] [address]   Covid 2 Pre-screening Negative   PLAN OF CARE and INTERVENTION:  1. ADVANCE CARE PLANNING/GOALS OF CARE: Goal is to remain at home with his wife. He is a Full Code. 2. PATIENT/CAREGIVER EDUCATION: Nausea Management and Dizziness 3. DISEASE STATUS: Met with patient, wife [name], son Quita Skye and hired caregiver [name], in patient's home. Patient lying in his recliner asleep initially, but remains easily aroused with verbal stimulation. Wife is concerned that patient has been sleeping most of the day for the past 5-6 days. He started having issues with dizziness when sitting up on the side of the bed or turning to his left side, starting over the weekend. Feels as if the room is spinning. He has started taking Compazine 2m every 6 hours PRN nausea. He took Compazine about an hour prior to my visit and while sitting still, he denies any nausea or dizziness at this time. Wife mentions that about 30 years ago, he had an inner ear issue that caused dizziness and wonders if this can be part of his issue. His appetite continues to deteriorate. He is only taking bites and sips. He has only had a few sips of orange juice and 1 bite of a biscuit. He has also been sipping on an Ensure. He continues to state that food does not taste good. Denies dysphagia and continues to take his medications without difficulty. He has not ambulated using his walker in the home for the past 5 days. He is only transferring from his hospital bed, taking about 2 steps to get into his recliner. He is using the bedpan and urinal. Reviewed goals of care. He wants to receive IV fluids if it is felt he is  dehydrated, but does not want a feeding tube. He states that he is not giving up, he just doesn't feel well. He is wearing his oxygen at 2L/min PRN. He continues with home health PT/OT/RN. ST has signed off. Contacted Dr. FErnestina Pennaoffice and spoke with his nurse to advise of the above information. She is going to send a message to Dr. FBurr Medicoto make her aware. Patient also needs a refill on patient's Metoprolol, and I also let MD know this as well. Will continue to monitor.                                                                                       HISTORY OF PRESENT ILLNESS:  This is an 78yo male who resides at home with his wife. His son, AQuita Skye has been there to assist patient as needed. Palliative care team continues to follow patient. Next visit scheduled Thursday, 01/04/19 at 1:00pm.  CODE STATUS: Full Code ADVANCED DIRECTIVES: Y MOST FORM: no PPS: 40%   PHYSICAL EXAM:   VITALS: Today's Vitals   01/01/19 1452  BP: 107/87  Pulse:  81  Resp: 16  Temp: 97.7 F (36.5 C)  TempSrc: Temporal  SpO2: 93%  PainSc: 0-No pain    LUNGS: clear to auscultation  CARDIAC: Cor RRR EXTREMITIES: No edema, mild ascites SKIN: Stage 2 to left buttocks (covered w/Allevyn pad), skin tear to right elbow and several scrapes to bilateral knees covered with bandaids from recent fall last week  NEURO: Alert and oriented x 3, sleepy, increased generalized weakness, transfers w/stand-by assistance   (Duration of visit and documentation 75 minutes)   Daryl Eastern, [name]

## 2019-01-01 NOTE — Telephone Encounter (Signed)
I called Antonio Manning back but did not get hold of her. Please let pt know to use zofran for nausea, and half tab compazine only if zofran is not enough for her nausea, because compazine can cause dizziness also. OK to stop metoprolol since his BP on low side and HR is normal, but watch his VS. Let me know if she has other concerns. Thanks   [name] MD

## 2019-01-01 NOTE — Telephone Encounter (Signed)
Monisha RN with Palliative Care calls stating saw the patient today. Patient is having nausea and dizziness, not eating and drinking well, taking Compazine 10 mg every 6 hours, still taking Metoprolol 12.5 mg daily only has about 5 days left, BP today was 107/87, P81.    Years ago he did have an inner ear issue and the dizziness seems to be when he turns his head to the left.  Her [phone]

## 2019-01-01 NOTE — Progress Notes (Signed)
COMMUNITY PALLIATIVE CARE RN NOTE  PATIENT NAME: Antonio Manning DOB: 10-Aug-1940 MRN: [id number]  PRIMARY CARE PROVIDER: Hulan Fess, MD  RESPONSIBLE PARTY: [name] (wife) Acct ID - Guarantor Home Phone Work Phone Relationship Acct Type  [id number] - Antonio Manning,Antonio Manning [phone]  Self P/F     [address], [place], [place] [address]   Covid-19 Pre-screening Negative  PLAN OF CARE and INTERVENTION:  1. ADVANCE CARE PLANNING/GOALS OF CARE: Goal is to remain at home with his wife. He is a Full Code. 2. PATIENT/CAREGIVER EDUCATION: Dyspnea and edema management; Skin interventions 3. DISEASE STATUS:  Met with patient and his wife, [name], in their home. Upon arrival, he is lying back in his recliner asleep, but easily aroused. He denies pain. His wife is concerned that he is declining. He has been sleeping most of the day for the past 4-5 days and his po intake has started to decline again. He had a recent fall in the driveway ambulating to his son's car. States his legs just gave out. Small scratches noted on bilateral knees. Wife says that he had been going out daily with his son for rides in the car, which may have been too much for him. Wife states he is also much weaker. He is back to using the bed pan and urinal vs ambulating to the bathroom. Wife also reporting increased dyspnea, however he is not wearing his oxygen on a continuous basis anymore. Only 2L/min PRN. At times his oxygen level will drop 87-89%. He reports not having much of an appetite anymore. Also states that food does not taste good. He has some nasal drainage that has started and is taking Claritin to help. Denies dysphagia. Taking his medications without difficulties. He has a small Stage 2 pressure ulcer to his Manning buttocks covered with an Allevyn pad. He continues with home health PT/OT/RN. He has a visit with OT today after my visit. Ascites has improved and stomach is less firm/distended. Edema improved to bilateral lower  extremities. He is elevating his legs on a wedge throughout the day. He does not like wearing the compression stockings. He now has a hired caregiver for several hours during the day to help with personal care. Will continue to monitor.                           HISTORY OF PRESENT ILLNESS:  This is a 78 yo male who resides at home with his wife. Palliative care team continues to follow patient. Will continue to visit patient monthly and PRN.  CODE STATUS: Full Code ADVANCED DIRECTIVES: Y MOST FORM: no PPS: 40%   PHYSICAL EXAM:   VITALS: Today's Vitals   12/29/18 1513  BP: 116/81  Pulse: (!) 59  Resp: 18  Temp: (!) 97.5 F (36.4 C)  TempSrc: Temporal  SpO2: 90%  PainSc: 0-No pain    LUNGS: clear to auscultation  CARDIAC: Cor Loletha Grayer EXTREMITIES: 1+ edema noted to bilateral lower ankles/feet SKIN: Stage 2 to Manning buttocks, skin tear to R elbow, small scratches on bilateral knees  NEURO: Alert and oriented x 3, sleepy, increased weakness, ambulatory with walker and 1 person assistance   (Duration of visit and documentation 75 minutes)    Daryl Eastern, [name]

## 2019-01-02 ENCOUNTER — Telehealth: Payer: Self-pay | Admitting: *Deleted

## 2019-01-02 DIAGNOSIS — L89123 Pressure ulcer of left upper back, stage 3: Secondary | ICD-10-CM | POA: Diagnosis not present

## 2019-01-02 DIAGNOSIS — C229 Malignant neoplasm of liver, not specified as primary or secondary: Secondary | ICD-10-CM | POA: Diagnosis not present

## 2019-01-02 DIAGNOSIS — A419 Sepsis, unspecified organism: Secondary | ICD-10-CM | POA: Diagnosis not present

## 2019-01-02 DIAGNOSIS — L22 Diaper dermatitis: Secondary | ICD-10-CM | POA: Diagnosis not present

## 2019-01-02 DIAGNOSIS — I110 Hypertensive heart disease with heart failure: Secondary | ICD-10-CM | POA: Diagnosis not present

## 2019-01-02 DIAGNOSIS — K922 Gastrointestinal hemorrhage, unspecified: Secondary | ICD-10-CM | POA: Diagnosis not present

## 2019-01-02 NOTE — Telephone Encounter (Signed)
Contacted and spoke with patient's son, Quita Skye, in regards to Dr. Ernestina Penna feedback on patient's new onset of nausea and dizzness. She recommends family utilizes Zofran for nausea and only 1/2 Compazine (5mg ) for nausea unrelieved with Zofran. She states that Compazine can cause dizziness. She also discontinued Metoprolol since patient's BP was on the lower side and HR is normal. I will continue to monitor VS as MD requests. [name] verbalized understanding.

## 2019-01-03 DIAGNOSIS — I110 Hypertensive heart disease with heart failure: Secondary | ICD-10-CM | POA: Diagnosis not present

## 2019-01-03 DIAGNOSIS — C229 Malignant neoplasm of liver, not specified as primary or secondary: Secondary | ICD-10-CM | POA: Diagnosis not present

## 2019-01-03 DIAGNOSIS — A419 Sepsis, unspecified organism: Secondary | ICD-10-CM | POA: Diagnosis not present

## 2019-01-03 DIAGNOSIS — K922 Gastrointestinal hemorrhage, unspecified: Secondary | ICD-10-CM | POA: Diagnosis not present

## 2019-01-03 DIAGNOSIS — L89123 Pressure ulcer of left upper back, stage 3: Secondary | ICD-10-CM | POA: Diagnosis not present

## 2019-01-03 DIAGNOSIS — L22 Diaper dermatitis: Secondary | ICD-10-CM | POA: Diagnosis not present

## 2019-01-04 ENCOUNTER — Other Ambulatory Visit: Payer: Self-pay

## 2019-01-04 ENCOUNTER — Other Ambulatory Visit: Payer: Medicare Other | Admitting: *Deleted

## 2019-01-04 ENCOUNTER — Other Ambulatory Visit: Payer: Self-pay | Admitting: Hematology

## 2019-01-04 DIAGNOSIS — M7989 Other specified soft tissue disorders: Secondary | ICD-10-CM

## 2019-01-04 DIAGNOSIS — L22 Diaper dermatitis: Secondary | ICD-10-CM | POA: Diagnosis not present

## 2019-01-04 DIAGNOSIS — C229 Malignant neoplasm of liver, not specified as primary or secondary: Secondary | ICD-10-CM | POA: Diagnosis not present

## 2019-01-04 DIAGNOSIS — A419 Sepsis, unspecified organism: Secondary | ICD-10-CM | POA: Diagnosis not present

## 2019-01-04 DIAGNOSIS — Z515 Encounter for palliative care: Secondary | ICD-10-CM

## 2019-01-04 DIAGNOSIS — K922 Gastrointestinal hemorrhage, unspecified: Secondary | ICD-10-CM | POA: Diagnosis not present

## 2019-01-04 DIAGNOSIS — I110 Hypertensive heart disease with heart failure: Secondary | ICD-10-CM | POA: Diagnosis not present

## 2019-01-04 DIAGNOSIS — L89123 Pressure ulcer of left upper back, stage 3: Secondary | ICD-10-CM | POA: Diagnosis not present

## 2019-01-05 NOTE — Progress Notes (Signed)
COMMUNITY PALLIATIVE CARE RN NOTE  PATIENT NAME: Antonio Manning DOB: 06/02/1940 MRN: [id number]  PRIMARY CARE PROVIDER: Hulan Fess, MD  RESPONSIBLE PARTY: [name] (wife) Acct ID - Guarantor Home Phone Work Phone Relationship Acct Type  [id number] - Scotti,Quindell L [phone]  Self P/F     [address], [place], [place] [address]   Covid-19 Pre-screening Negative  PLAN OF CARE and INTERVENTION 1. ADVANCE CARE PLANNING/GOALS OF CARE: Goal is for patient to feel better and gain more strength. He is a Full Code. 2. PATIENT/CAREGIVER EDUCATION: Nausea management, dyspnea management and skin breakdown prevention 3. DISEASE STATUS:  Met with patient, wife Antonio Manning, daughter [name] Antonio Manning. Upon arrival, patient lying back in his recliner. He reports continued dizziness, but feels it has improved since yesterday. Dizziness mainly occurring when he sits up and turns to the left. Family feels that this is vertigo. He has only had some mild nausea today. Family has tried giving Zofran for nausea, but feels that the Compazine is working better for nausea and dizziness. He has eaten better today. He ate almost 1/2 of his oatmeal, 75% of a gravy biscuit, strawberry shortcake and coffee. He is trying to drink more fluids. He has not been taking his Potassium for the past few days d/t nausea, but continues on Lasix daily. I recommended that he try and take his Potassium after taking his nausea medication to see if this helps. Yesterday, he was able to ambulate using his walker, from his recliner to his door (~50 ft). He is spending the majority of his time sitting in his recliner and continues to sleep more overall. He is no longer receiving PT/OT. He continues with a home Doctor, general practice. Patient wants to get stronger, but feels that the dizziness has caused a set back. Family requests weekly RN visits since patient was recently taken off of his Metoprolol to check his vital signs and overall condition.  He remains on Oxygen at 2L/min via [place]. He is wearing this on a PRN basis. He does have some instances of dyspnea with exertion and occasionally while at rest. During this time he will place his oxygen back on and take deep breaths and breathing will improve. He continues to use to bedpan and urinal vs ambulating to the bathroom d/t weakness. Will continue to monitor.    4.                                                                                    HISTORY OF PRESENT ILLNESS: This is a 78 yo male who resides at home with his wife. Palliative care team continues to follow patient. Will visit patient weekly and PRN.   CODE STATUS: Full Code ADVANCED DIRECTIVES: Y MOST FORM: no PPS: 40%   PHYSICAL EXAM:   VITALS: Today's Vitals   01/04/19 1337  BP: 111/84  Pulse: 72  Resp: 18  Temp: (!) 97.4 F (36.3 C)  TempSrc: Temporal  SpO2: 100%  PainSc: 0-No pain    LUNGS: clear to auscultation  CARDIAC: Irregular rhythm EXTREMITIES: No edema, minimal ascites (much improved) SKIN: Stage 2 on left buttocks, scab noted to right elbow and  several scratches on bilateral knees from a fall about 2 1/2 weeks ago  NEURO: Alert and oriented x 3, increased generalized weakness, minimally ambulatory with walker   (Duration of visit and documentation 75 minutes)   Daryl Eastern, [name]

## 2019-01-06 DIAGNOSIS — R0902 Hypoxemia: Secondary | ICD-10-CM | POA: Diagnosis not present

## 2019-01-06 DIAGNOSIS — K5900 Constipation, unspecified: Secondary | ICD-10-CM | POA: Diagnosis not present

## 2019-01-06 DIAGNOSIS — R110 Nausea: Secondary | ICD-10-CM | POA: Diagnosis not present

## 2019-01-09 ENCOUNTER — Other Ambulatory Visit: Payer: Medicare Other | Admitting: *Deleted

## 2019-01-09 ENCOUNTER — Telehealth: Payer: Self-pay

## 2019-01-09 ENCOUNTER — Other Ambulatory Visit: Payer: Self-pay

## 2019-01-09 DIAGNOSIS — Z515 Encounter for palliative care: Secondary | ICD-10-CM

## 2019-01-09 NOTE — Telephone Encounter (Signed)
If dizziness and nausea are related to vertigo he may benefit from antihistamine like meclizine or benadryl, but given his age and deconditioning this may worsen his symptoms, constipation, etc. I defer to Dr. Burr Medico.  Regan Rakers

## 2019-01-09 NOTE — Telephone Encounter (Signed)
Monisa Rn with Palliative Care calls with update on patient.  He still complains of dizziness and nausea, so nauseated does feel like he can take the nausea medications.  She is wondering if something in a suppository form would be better.    His son still thinks his dizziness is r/t inner ear problem.  EMS had to come out Saturday night due to excessive nausea.  He also had not had a BM in about 5 days.  He was given IVF and was suggested to use Ducolax suppositories which gave him good results.    Monisa's number [phone]

## 2019-01-10 ENCOUNTER — Other Ambulatory Visit: Payer: Self-pay | Admitting: Hematology

## 2019-01-10 ENCOUNTER — Telehealth: Payer: Self-pay | Admitting: *Deleted

## 2019-01-10 DIAGNOSIS — L22 Diaper dermatitis: Secondary | ICD-10-CM | POA: Diagnosis not present

## 2019-01-10 DIAGNOSIS — L89123 Pressure ulcer of left upper back, stage 3: Secondary | ICD-10-CM | POA: Diagnosis not present

## 2019-01-10 DIAGNOSIS — I110 Hypertensive heart disease with heart failure: Secondary | ICD-10-CM | POA: Diagnosis not present

## 2019-01-10 DIAGNOSIS — K922 Gastrointestinal hemorrhage, unspecified: Secondary | ICD-10-CM | POA: Diagnosis not present

## 2019-01-10 DIAGNOSIS — A419 Sepsis, unspecified organism: Secondary | ICD-10-CM | POA: Diagnosis not present

## 2019-01-10 DIAGNOSIS — C229 Malignant neoplasm of liver, not specified as primary or secondary: Secondary | ICD-10-CM | POA: Diagnosis not present

## 2019-01-10 MED ORDER — ONDANSETRON 4 MG PO TBDP: 4.0000 mg | ORAL_TABLET | Freq: Three times a day (TID) | ORAL | 0 refills | Status: AC | PRN

## 2019-01-10 MED ORDER — PROCHLORPERAZINE 25 MG RE SUPP: 25.0000 mg | Freq: Two times a day (BID) | RECTAL | 0 refills | Status: AC | PRN

## 2019-01-10 NOTE — Telephone Encounter (Signed)
I called Monisa and called in compazine suppository and zofran ODT, I suggest him to try OTC low dose Meclizine also for dizziness. She will let pt's family know. Thanks   [name] MD

## 2019-01-10 NOTE — Progress Notes (Signed)
COMMUNITY PALLIATIVE CARE RN NOTE  PATIENT NAME: Antonio Manning DOB: Feb 01, 1941 MRN: [id number]  PRIMARY CARE PROVIDER: Hulan Fess, MD  RESPONSIBLE PARTY: Antonio Manning (wife) Acct ID - Guarantor Home Phone Work Phone Relationship Acct Type  [id number] - Antonio Manning [phone]  Self P/F     [address], [name], [name], Safe Mobility, Bowel Management 3. DISEASE STATUS: Met with patient, wife, [name], Antonio Manning. Patient lying back in his recliner upon my arrival. He is reporting some mild nausea and dizziness. Dizziness remains worse when sitting up and turning to his Manning side. He was able to eat a whole gravy biscuit this am for breakfast. Wife gave him a PRN Zofran about an hour prior to my visit. Initially, he was unable to take this medication because he was too nauseated and felt if he took it, it would make him vomit. Patient has not vomited. EMS was called night of 01/06/19 d/t c/o severe nausea and he had not had a BM in 5 days. EMS was able to give him some IV fluids and a dose of IV Zofran. Also, family bought some Dulcolax suppositories and he was able to have a BM with good results. He went out yesterday with his son for a brief ride in the car. He had some difficulties walking back to his recliner afterwards d/t increased weakness. He spends most of his day lying in the chair. He continues to use the bed pan and urinal to conserve energy. Recommended that I contact Dr. Burr Medico to request a suppository for nausea to help when he feels he is unable to take a pill. Wife/son agreed. His wounds to his buttocks has healed. Bandage remains on Manning knee from fall a few weeks ago where he scraped his knees on the concrete as it is still open. Scrapes  to R knee and leg has healed. Redness noted to R heel. Reinforced floating heels while lying down to prevent further breakdown.   Contacted son Antonio Manning to provide patient update. Contacted Dr. Ernestina Penna office and left a voicemail to provide update and request suppository for nausea. Awaiting call back.   HISTORY OF PRESENT ILLNESS:  This is a 78 yo male who resides at home with his wife. Paliative care team continues to follow patient. Will continue to visit patient monthly and PRN.  CODE STATUS: Full Code ADVANCED DIRECTIVES: Y MOST FORM: no PPS: 40%   PHYSICAL EXAM:   VITALS: Today's Vitals   01/09/19 1418  BP: 119/89  Pulse: (!) 58  Resp: 16  Temp: (!) 97 F (36.1 C)  TempSrc: Temporal  SpO2: 92%  PainSc: 0-No pain    LUNGS: clear to auscultation  CARDIAC: Cor Loletha Grayer EXTREMITIES: No edema, [name], sleepy, increased generalized weakness, [name], [name]

## 2019-01-10 NOTE — Telephone Encounter (Signed)
8:47am Received a call from Dr. Burr Medico regarding message I left yesterday reporting patient's continued c/o nausea and dizziness. MD advised she is ordering Compazine suppositories and Zofran ODT. She also advised that patient could try Meclizine over the counter. I contacted patient's son, Quita Skye, and made aware of new orders. He is appreciative of interventions.

## 2019-01-12 ENCOUNTER — Other Ambulatory Visit: Payer: Self-pay

## 2019-01-12 ENCOUNTER — Other Ambulatory Visit: Payer: Medicare Other | Admitting: *Deleted

## 2019-01-12 DIAGNOSIS — Z515 Encounter for palliative care: Secondary | ICD-10-CM

## 2019-01-15 NOTE — Progress Notes (Signed)
COMMUNITY PALLIATIVE CARE RN NOTE  PATIENT NAME: KUPONO MARLING DOB: 31-Mar-1941 MRN: [id number]  PRIMARY CARE PROVIDER: Hulan Fess, MD  RESPONSIBLE PARTY: [name] (wife) Acct ID - Guarantor Home Phone Work Phone Relationship Acct Type  [id number] - Ayala,Khambrel L [phone]  Self P/F     [address], [place], [place] [address]   Covid-19 Pre-screening Negative  PLAN OF CARE and INTERVENTION 1. ADVANCE CARE PLANNING/GOALS OF CARE: Goal is for patient to remain at home with his wife. He is a Full Code. 2. PATIENT/CAREGIVER EDUCATION: Nausea Management, Safe Transfers/Mobility 3. DISEASE STATUS:  Met with patient, his wife [name] and hired caregiver [name] in the home. He is lying back in his recliner asleep, but easily aroused with verbal stimulation. He denies pain. He reports mild nausea and dizziness, but feels that it is improved. Wife advised that his home health PT performed the Epley Maneuver this week to help improve dizziness and feel that it has helped some. He has Zofran and Compazine po/pr available in the home if needed.  He knows to change positions slowly and wait until dizziness subsides before attempting to walk. He reports ambulating with his walker to his back porch and went out for a ride in the car with his son, Drako, yesterday. He is going to the [place] today with his wife and son for 24 hours. It is about a 2 hour ride. He says that he feels well enough to go. He ate a gravy biscuit this morning and caregiver is pushing fluids. He is taking his medications without difficulty. Denies dysphagia. He continues to sleep much of the day. He is weak and still uses the urinal and bed pan. Wife had to give patient 2 Dulcolax suppositories this week, and he had a large BM this am. He is wearing his oxygen on a PRN basis, but will put it on if he becomes short of breath. He has completed all therapies with [facility] home health services. Will continue to monitor.                                                                                  HISTORY OF PRESENT ILLNESS:  This is a 78 yo male who resides at home with his wife. Palliative care team continues to follow patient. Will visit patient weekly and PRN.  CODE STATUS: Full Code ADVANCED DIRECTIVES: Y MOST FORM: no PPS: 40%   PHYSICAL EXAM:   VITALS: Today's Vitals   01/12/19 1109  BP: 99/72  Pulse: 67  Resp: 16  Temp: (!) 97.1 F (36.2 C)  TempSrc: Temporal  SpO2: 94%  PainSc: 0-No pain    LUNGS: clear to auscultation  CARDIAC: Cor RRR EXTREMITIES: No edema SKIN: Redness noted to R heel, L knee abrasion covered with bandaid  NEURO: Alert and oriented x 3, drowsy, generalized weakness, ambulatory with walker and stand-by assistance   (Duration of visit and documentation 60 minutes)    Daryl Eastern, [name]

## 2019-01-16 ENCOUNTER — Other Ambulatory Visit: Payer: Self-pay

## 2019-01-16 ENCOUNTER — Other Ambulatory Visit: Payer: Medicare Other | Admitting: *Deleted

## 2019-01-16 DIAGNOSIS — Z515 Encounter for palliative care: Secondary | ICD-10-CM

## 2019-01-17 ENCOUNTER — Other Ambulatory Visit: Payer: Medicare Other | Admitting: *Deleted

## 2019-01-17 ENCOUNTER — Other Ambulatory Visit: Payer: Self-pay

## 2019-01-17 DIAGNOSIS — Z515 Encounter for palliative care: Secondary | ICD-10-CM

## 2019-01-18 ENCOUNTER — Other Ambulatory Visit: Payer: Self-pay | Admitting: Hematology

## 2019-01-18 ENCOUNTER — Telehealth: Payer: Self-pay

## 2019-01-18 MED ORDER — PROMETHAZINE HCL 25 MG PO TABS: 25.0000 mg | ORAL_TABLET | Freq: Two times a day (BID) | ORAL | 0 refills | Status: AC | PRN

## 2019-01-18 NOTE — Progress Notes (Signed)
COMMUNITY PALLIATIVE CARE RN NOTE  PATIENT NAME: Antonio Manning DOB: [birth year] MRN: [id number]  PRIMARY CARE PROVIDER: Hulan Fess, MD  RESPONSIBLE PARTY: Antonio Manning (wife) Acct ID - Guarantor Home Phone Work Phone Relationship Acct Type  [id number] - Antonio Manning [phone]  Self P/F     [address], [place], [place] [address]   Covid-19 Pre-screening Negative  PLAN OF CARE and INTERVENTION 1. ADVANCE CARE PLANNING/GOALS OF CARE: Goal is for patient to be less nauseated in order to eat better. He is a Full Code. 2. PATIENT/CAREGIVER EDUCATION: Nausea Management 3. DISEASE STATUS:  Met with patient, his wife Antonio Manning and daughter Antonio Manning. Patient is lying back in his recliner asleep, but easily aroused with verbal stimulation. He has been receiving Compazine suppositories every 12 hours. They do help, but effects only last for about 2 hours. He has been c/o stomach pain. He feels hungry all of the time, but is unable to eat much d/t nausea. He is usually able to eat 75-100% of a gravy biscuit in the am, but today was only able to take a few bites. He tries to sip on fluids, but this is difficult d/t nausea. Recommended that they utilize the Zofran ODT more regularly along with the Compazine to see if this will help. He continues to sleep most of the time. He is able to ambulate using his walker short distances, but he fatigues very easily. He continues to utilize the bed pan and urinal as it is easier than trying to walk to the bathroom. He did go to the [facility] this weekend for 24 hours. Travel time was about 3 hours each way. Initially, he was able to sit up front, but then had to lie down in the back of the truck during the trip. He continues with some intermittent dizziness. He knows to change positions slowly. Dizziness mainly occurring when he turns his left side and sitting up. They are going to try giving him nausea medication more regularly to see if this will help. Will continue to monitor.     HISTORY OF PRESENT ILLNESS:  This is a 78 yo male who resides at home with his wife. Palliative care team continue to monitor patient. Will continue to visit weekly and PRN.  CODE STATUS: Full Code ADVANCED DIRECTIVES: Y MOST FORM: no PPS: 40%   PHYSICAL EXAM:   VITALS: Today's Vitals   01/16/19 1616  BP: 111/84  Pulse: 70  Resp: 16  Temp: (!) 97.1 F (36.2 C)  TempSrc: Temporal  SpO2: 91%  PainSc: 3   PainLoc: Abdomen    LUNGS: clear to auscultation  CARDIAC: Cor RRR EXTREMITIES: No edema SKIN: Exposed skin is dry and intact, bandage to Manning knee abrasion dry and intact  NEURO: Alert and oriented x 3, generalized weakness, ambulatory with walker and 1 person - stand-by assistance   (Duration of visit and documentation 75 minutes)    Daryl Eastern, [name]

## 2019-01-18 NOTE — Telephone Encounter (Signed)
Received a voicemail from Lamont (RN with [facility]) stating that patient is still having break through nausea even with Zofran and Compazine. Will inform Dr. Burr Medico and return call with updated instructions if necessary.

## 2019-01-18 NOTE — Progress Notes (Signed)
COMMUNITY PALLIATIVE CARE RN NOTE  PATIENT NAME: Antonio Manning DOB: 1941-02-28 MRN: [id number]  PRIMARY CARE PROVIDER: Hulan Fess, MD  RESPONSIBLE PARTY: [name] (wife) Acct ID - Guarantor Home Phone Work Phone Relationship Acct Type  [id number] HUSSIEN, BIBA [phone]  Self P/F     [place], [place], [place] [address]   Due to the COVID-19 crisis, this virtual check-in visit was done via telephone from my office and it was initiated and consent by this patient and or family.  PLAN OF CARE and INTERVENTION 1. ADVANCE CARE PLANNING/GOALS OF CARE: Goal is for patient to be able to eat more and feel better. He is a Full Code. 2. PATIENT/CAREGIVER EDUCATION: Nausea Management  3. DISEASE STATUS:  Virtual check-in visit completed via telephone. Patient reports some mild abdominal pain and nausea, but has improved some since yesterday. He has been able to eat slightly more over the past 24 hours since taking the Compazine and Zofran more regularly, however some nausea still persists. He is taking in bites and sips of food throughout the day but continues to feel hungry. Encouraged him to continue to push fluids as much as he can tolerate. Also food does not taste good much of the time. He c/o nasal drainage going down the back of his throat which he feels may be contributing to his feelings of nausea. He has tried Claritin in the past but did not feel that it helped. Family is going to try Zyrtec and Flonase to see if this will help dry up some of the drainage. They are going to continue the current nausea regimen and I will follow up with patient tomorrow to see how he is feeling. Will continue to monitor.   HISTORY OF PRESENT ILLNESS:  This is a 78 yo male who resides at home with his wife. Palliative Care team continues to follow patient. Will continue to visit patient weekly and PRN.  CODE STATUS: Full Code ADVANCED DIRECTIVES: Y MOST FORM: no PPS: 40%   (Duration of visit and  documentation 30 minutes)   Daryl Eastern, [name]

## 2019-01-18 NOTE — Telephone Encounter (Signed)
I spoke with Antonio Manning and called in phenergan suppository for Antonio Manning.   [name] MD

## 2019-01-22 ENCOUNTER — Other Ambulatory Visit: Payer: Medicare Other | Admitting: *Deleted

## 2019-01-22 ENCOUNTER — Other Ambulatory Visit: Payer: Self-pay

## 2019-01-22 DIAGNOSIS — Z515 Encounter for palliative care: Secondary | ICD-10-CM

## 2019-01-22 NOTE — Progress Notes (Signed)
COMMUNITY PALLIATIVE CARE RN NOTE  PATIENT NAME: Antonio Manning DOB: Sep 02, 1940 MRN: [id number]  PRIMARY CARE PROVIDER: Hulan Fess, MD  RESPONSIBLE PARTY:  Acct ID - Guarantor Home Phone Work Phone Relationship Acct Type  [id number] - [name],[name] [phone]  Self P/F     [address], [place], [place] [address]   Covid-19 Pre-screening Negative  PLAN OF CARE and INTERVENTION 1. ADVANCE CARE PLANNING/GOALS OF CARE: Goal is for patient to remain at home with his wife. He is a Full Code. 2. PATIENT/CAREGIVER EDUCATION: Nausea Management, Safe Mobility 3. DISEASE STATUS: Met with patient, wife Antonio Manning, and hired caregiver Antonio Manning, in patient's home. He is sitting up in his recliner asleep. Slow to arouse initially, but did wake up and remains able to answer questions and make needs known. Patient was started on Phenergan 25 mg po every 12 hours PRN nausea/vomiting last week, d/t ongoing nausea despite use of Compazine and Zofran. Wife reports that over the weekend, patient has been more confused, sitting up on the edge of the bed and trying to walk by himself. Speech was also nonsensical and another time kept pressing the button on his lift chair controller until he almost stood himself up. They decided to cut this pill in half and only give him 12.5 mg instead to see if this would improve confusion. Other than the confusion, wife feels that this medication has helped him more with nausea than anything else. He was able to eat and drink well yesterday. He only reports very mild nausea at this time, but tolerable. He denies dizziness. He does not have much of an appetite at the moment. He had multiple soft BMs yesterday. He has been ambulating some in the home with use of his walker and 1 person assistance. I called [name] to provide patient update. Will continue to monitor.  HISTORY OF PRESENT ILLNESS: This is a 78 yo male who resides at home with his wife. Palliative care team continues to follow  patient. Next visit scheduled for 01/26/19 at 3:00 pm.    CODE STATUS: Full Code ADVANCED DIRECTIVES: Y MOST FORM: no PPS: 40%   PHYSICAL EXAM:   VITALS: Today's Vitals   01/22/19 1349  BP: 116/88  Pulse: 63  Resp: 16  Temp: (!) 97.3 F (36.3 C)  TempSrc: Temporal  SpO2: 96%  PainSc: 0-No pain    LUNGS: clear to auscultation  CARDIAC: Cor irreg EXTREMITIES: No edema SKIN: Bottom is clear and pressure sores have healed, exposed skin is dry and intact  NEURO: Alert and oriented x 3, drowsy, generalized weakness, ambulatory with walker and 1 person assistance   (Duration of visit and documentation 60 minutes)   Daryl Eastern, [name]

## 2019-01-23 ENCOUNTER — Other Ambulatory Visit: Payer: Self-pay | Admitting: Hematology

## 2019-01-23 DIAGNOSIS — C220 Liver cell carcinoma: Secondary | ICD-10-CM

## 2019-01-24 ENCOUNTER — Other Ambulatory Visit: Payer: Self-pay

## 2019-01-24 ENCOUNTER — Other Ambulatory Visit: Payer: Medicare Other | Admitting: *Deleted

## 2019-01-24 DIAGNOSIS — Z515 Encounter for palliative care: Secondary | ICD-10-CM | POA: Diagnosis not present

## 2019-01-25 ENCOUNTER — Telehealth: Payer: Self-pay

## 2019-01-25 DIAGNOSIS — Z7401 Bed confinement status: Secondary | ICD-10-CM | POA: Diagnosis not present

## 2019-01-25 DIAGNOSIS — K210 Gastro-esophageal reflux disease with esophagitis: Secondary | ICD-10-CM | POA: Diagnosis not present

## 2019-01-25 DIAGNOSIS — E039 Hypothyroidism, unspecified: Secondary | ICD-10-CM | POA: Diagnosis not present

## 2019-01-25 DIAGNOSIS — I4891 Unspecified atrial fibrillation: Secondary | ICD-10-CM | POA: Diagnosis not present

## 2019-01-25 DIAGNOSIS — I2510 Atherosclerotic heart disease of native coronary artery without angina pectoris: Secondary | ICD-10-CM | POA: Diagnosis not present

## 2019-01-25 DIAGNOSIS — E46 Unspecified protein-calorie malnutrition: Secondary | ICD-10-CM | POA: Diagnosis not present

## 2019-01-25 DIAGNOSIS — I679 Cerebrovascular disease, unspecified: Secondary | ICD-10-CM | POA: Diagnosis not present

## 2019-01-25 DIAGNOSIS — R112 Nausea with vomiting, unspecified: Secondary | ICD-10-CM | POA: Diagnosis not present

## 2019-01-25 DIAGNOSIS — E785 Hyperlipidemia, unspecified: Secondary | ICD-10-CM | POA: Diagnosis not present

## 2019-01-25 DIAGNOSIS — C220 Liver cell carcinoma: Secondary | ICD-10-CM | POA: Diagnosis not present

## 2019-01-25 DIAGNOSIS — R64 Cachexia: Secondary | ICD-10-CM | POA: Diagnosis not present

## 2019-01-25 DIAGNOSIS — J189 Pneumonia, unspecified organism: Secondary | ICD-10-CM | POA: Diagnosis not present

## 2019-01-25 NOTE — Telephone Encounter (Signed)
-----   Message from [name], MD sent at 01/24/2019  6:02 PM EDT ----- Regarding: RE: HH for IVF I spoke with [name], I am little concerned about the potential worsening leg edema and ascites from IVF, so I asked him to speak with his father to see if he truly wants to get IVF. Please speak with his hospice nurse tomorrow or let her call me, to get a sense if he would benefit form ivf.  Allegra Grana  ----- Message ----- From: Zola Button, RN Sent: 01/24/2019   3:32 PM EDT To: Jonnie Finner, RN, Alla Feeling, NP, # Subject: [facility] for IVF                                     Moisha-RN with Hospice called to say the patient's nausea is better but that he's not eating or drinking very much. She said the family would like him to get IVF at home, but that [facility] isn't able to administer them it would have to be a home health agency. Would you like me to put a referral in for [facility] or is there something else I should do?  Thanks,  [facility]

## 2019-01-25 NOTE — Telephone Encounter (Signed)
Called Monisha-RN with Palliative Care to update her on Dr. Ernestina Penna perspective of IVF, and let her know that Dr. Burr Medico talked with patient's son Quita Skye. Malachy Mood mentioned that she texted with Quita Skye and called the patient's daughter yesterday, and both seemed agreeable to a discussion about Hospice.   Gave verbal order per Dr. Burr Medico for [facility] consultation and for Dr. Burr Medico to serve as attending. Will continue to support patient as needed.

## 2019-01-25 NOTE — Progress Notes (Signed)
COMMUNITY PALLIATIVE CARE RN NOTE  PATIENT NAME: Antonio Manning DOB: 03-19-1941 MRN: [id number]  PRIMARY CARE PROVIDER: Hulan Fess, MD  RESPONSIBLE PARTY: Antonio Manning (wife) Acct ID - Guarantor Home Phone Work Phone Relationship Acct Type  [id number] - Antonio Manning [phone]  Self P/F     [address], [place], [place] [address]   Covid-19 Pre-screening Negative  PLAN OF CARE and INTERVENTION 1. ADVANCE CARE PLANNING/GOALS OF CARE: Goal is for patient to remain at home with his wife. He is a Full code. 2. PATIENT/CAREGIVER EDUCATION: Disease Progression 3. DISEASE STATUS: Met with patient, wife, [name], Antonio Manning in their home. Patient is lying back in his recliner asleep. Wife reports that patient has been sleeping most of the time. He does arouse with verbal and tactile stimulation and remains able to answer simple questions, but is drowsy and less engaging. He c/o dizziness this am when caregiver assisted him with transferring from his hospital bed to his recliner. He stated he was hungry prior to the transfer, but then afterwards only wanted some orange juice. He drank about 4 oz. He denies any pain, nausea or dizziness at present time. He is progressively weaker and has not ambulated in the home with his walker in several days. He is only taking in bites and sips of food/fluids. Generalized muscle wasting apparent. He has not required Prn Phenergan in the past 2 days. He continues with some intermittent confusion despite use of this medication. He had an episode of bowel incontinence last pm. He continues to use the bed pan and urinal. He requires 1 person assistance with bathing, dressing and transfers. Wife and caregiver want patient to receive IV fluids because they feel that he is dehydrated. Patient received a bag of IV fluids about a few weeks ago when EMS came out, but only seemed to "perk up" for about 2 days. They request that I speak with Antonio Manning regarding IV  fluids. Voicemail left for Antonio Manning. Awaiting return call. Wife advised that her daughter Antonio Manning wanted to speak with me and asked me to call her. Spoke with Antonio Manning regarding patient's overall condition and she is going to speak with her family regarding a hospice consult. Antonio Manning appreciative of phone call.   HISTORY OF PRESENT ILLNESS: This is a 78 yo male who resides at home with his wife. Palliative care team continue to follow patient for added support. Will continue to monitor patient condition.  CODE STATUS: Full Code ADVANCED DIRECTIVES: y MOST FORM: no PPS: 40%   PHYSICAL EXAM:   VITALS: Today's Vitals   01/24/19 1301  BP: 123/79  Pulse: 66  Resp: 18  Temp: (!) 97.5 F (36.4 C)  TempSrc: Temporal  SpO2: 93%  PainSc: 0-No pain    LUNGS: clear to auscultation  CARDIAC: Cor RRR EXTREMITIES: No edema or ascites present SKIN: Skin cool touch, exposed skin is dry and intact  NEURO: Alert and oriented x 2 (person/place), lethargic, intermittent confusion, requires assistance with transfers   (Duration of visit and documentation 75 minutes)    Daryl Eastern, [name]

## 2019-01-26 ENCOUNTER — Telehealth: Payer: Self-pay

## 2019-01-26 DIAGNOSIS — K210 Gastro-esophageal reflux disease with esophagitis: Secondary | ICD-10-CM | POA: Diagnosis not present

## 2019-01-26 DIAGNOSIS — I4891 Unspecified atrial fibrillation: Secondary | ICD-10-CM | POA: Diagnosis not present

## 2019-01-26 DIAGNOSIS — C220 Liver cell carcinoma: Secondary | ICD-10-CM | POA: Diagnosis not present

## 2019-01-26 DIAGNOSIS — J189 Pneumonia, unspecified organism: Secondary | ICD-10-CM | POA: Diagnosis not present

## 2019-01-26 DIAGNOSIS — E46 Unspecified protein-calorie malnutrition: Secondary | ICD-10-CM | POA: Diagnosis not present

## 2019-01-26 DIAGNOSIS — I679 Cerebrovascular disease, unspecified: Secondary | ICD-10-CM | POA: Diagnosis not present

## 2019-01-26 NOTE — Telephone Encounter (Signed)
Lindsey-RN with Palliative Care left a voicemail with an update on patient's status, and to see if Dr. Burr Medico was comfortable with discontinuing his Eliquis prescription. The Palliative physician felt the risk for bleeding outweighed the potential benefits for staying on the medication. She also stated that the patient barely eats or drinks ("maybe 1-2 bites a day") and is minimally responsive.   Updated Dr. Burr Medico who said she was fine with patient stopping Eliquis. Called Mendel Ryder back at [phone] to update her and see if there was anything else her team needed from [place]. Verbalized understanding and denied any other needs at this time. Will continue to support as needed

## 2019-01-27 DIAGNOSIS — K210 Gastro-esophageal reflux disease with esophagitis: Secondary | ICD-10-CM | POA: Diagnosis not present

## 2019-01-27 DIAGNOSIS — C220 Liver cell carcinoma: Secondary | ICD-10-CM | POA: Diagnosis not present

## 2019-01-27 DIAGNOSIS — I4891 Unspecified atrial fibrillation: Secondary | ICD-10-CM | POA: Diagnosis not present

## 2019-01-27 DIAGNOSIS — E46 Unspecified protein-calorie malnutrition: Secondary | ICD-10-CM | POA: Diagnosis not present

## 2019-01-27 DIAGNOSIS — J189 Pneumonia, unspecified organism: Secondary | ICD-10-CM | POA: Diagnosis not present

## 2019-01-27 DIAGNOSIS — I679 Cerebrovascular disease, unspecified: Secondary | ICD-10-CM | POA: Diagnosis not present

## 2019-01-28 ENCOUNTER — Other Ambulatory Visit: Payer: Self-pay | Admitting: Hematology

## 2019-01-28 DIAGNOSIS — C220 Liver cell carcinoma: Secondary | ICD-10-CM

## 2019-01-29 ENCOUNTER — Other Ambulatory Visit: Payer: Self-pay

## 2019-01-31 ENCOUNTER — Telehealth: Payer: Self-pay

## 2019-01-31 DIAGNOSIS — E46 Unspecified protein-calorie malnutrition: Secondary | ICD-10-CM | POA: Diagnosis not present

## 2019-01-31 DIAGNOSIS — I4891 Unspecified atrial fibrillation: Secondary | ICD-10-CM | POA: Diagnosis not present

## 2019-01-31 DIAGNOSIS — J189 Pneumonia, unspecified organism: Secondary | ICD-10-CM | POA: Diagnosis not present

## 2019-01-31 DIAGNOSIS — C220 Liver cell carcinoma: Secondary | ICD-10-CM | POA: Diagnosis not present

## 2019-01-31 DIAGNOSIS — K210 Gastro-esophageal reflux disease with esophagitis: Secondary | ICD-10-CM | POA: Diagnosis not present

## 2019-01-31 DIAGNOSIS — I679 Cerebrovascular disease, unspecified: Secondary | ICD-10-CM | POA: Diagnosis not present

## 2019-01-31 NOTE — Telephone Encounter (Signed)
Faxed signed order back to Hospice, sent to HIM for scanning. 

## 2019-02-01 ENCOUNTER — Telehealth: Payer: Self-pay

## 2019-02-01 DIAGNOSIS — Z7401 Bed confinement status: Secondary | ICD-10-CM | POA: Diagnosis not present

## 2019-02-01 DIAGNOSIS — R64 Cachexia: Secondary | ICD-10-CM | POA: Diagnosis not present

## 2019-02-01 DIAGNOSIS — I679 Cerebrovascular disease, unspecified: Secondary | ICD-10-CM | POA: Diagnosis not present

## 2019-02-01 DIAGNOSIS — E46 Unspecified protein-calorie malnutrition: Secondary | ICD-10-CM | POA: Diagnosis not present

## 2019-02-01 DIAGNOSIS — C220 Liver cell carcinoma: Secondary | ICD-10-CM | POA: Diagnosis not present

## 2019-02-01 DIAGNOSIS — I2510 Atherosclerotic heart disease of native coronary artery without angina pectoris: Secondary | ICD-10-CM | POA: Diagnosis not present

## 2019-02-01 DIAGNOSIS — I4891 Unspecified atrial fibrillation: Secondary | ICD-10-CM | POA: Diagnosis not present

## 2019-02-01 DIAGNOSIS — K2100 Gastro-esophageal reflux disease with esophagitis, without bleeding: Secondary | ICD-10-CM | POA: Diagnosis not present

## 2019-02-01 DIAGNOSIS — E039 Hypothyroidism, unspecified: Secondary | ICD-10-CM | POA: Diagnosis not present

## 2019-02-01 DIAGNOSIS — J189 Pneumonia, unspecified organism: Secondary | ICD-10-CM | POA: Diagnosis not present

## 2019-02-01 DIAGNOSIS — E785 Hyperlipidemia, unspecified: Secondary | ICD-10-CM | POA: Diagnosis not present

## 2019-02-01 DIAGNOSIS — R112 Nausea with vomiting, unspecified: Secondary | ICD-10-CM | POA: Diagnosis not present

## 2019-02-01 NOTE — Telephone Encounter (Signed)
[facility] RN left message just information for Dr. Burr Medico.  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  The family notified a dermatologist friend over the weekend that it had gotten much larger and was more painful.  The dermatologist came out to the home to lance it and evidently drained quite a bit of pus out of it. They also recommending placing him on an antibiotic (Septra).   She states he is actively dying, taking only sips and a couple of bites of food daily.  [phone]

## 2019-02-02 DIAGNOSIS — E46 Unspecified protein-calorie malnutrition: Secondary | ICD-10-CM | POA: Diagnosis not present

## 2019-02-02 DIAGNOSIS — J189 Pneumonia, unspecified organism: Secondary | ICD-10-CM | POA: Diagnosis not present

## 2019-02-02 DIAGNOSIS — C220 Liver cell carcinoma: Secondary | ICD-10-CM | POA: Diagnosis not present

## 2019-02-02 DIAGNOSIS — E785 Hyperlipidemia, unspecified: Secondary | ICD-10-CM | POA: Diagnosis not present

## 2019-02-02 DIAGNOSIS — I679 Cerebrovascular disease, unspecified: Secondary | ICD-10-CM | POA: Diagnosis not present

## 2019-02-02 DIAGNOSIS — I4891 Unspecified atrial fibrillation: Secondary | ICD-10-CM | POA: Diagnosis not present

## 2019-02-06 DIAGNOSIS — E46 Unspecified protein-calorie malnutrition: Secondary | ICD-10-CM | POA: Diagnosis not present

## 2019-02-06 DIAGNOSIS — I4891 Unspecified atrial fibrillation: Secondary | ICD-10-CM | POA: Diagnosis not present

## 2019-02-06 DIAGNOSIS — I679 Cerebrovascular disease, unspecified: Secondary | ICD-10-CM | POA: Diagnosis not present

## 2019-02-06 DIAGNOSIS — J189 Pneumonia, unspecified organism: Secondary | ICD-10-CM | POA: Diagnosis not present

## 2019-02-06 DIAGNOSIS — E785 Hyperlipidemia, unspecified: Secondary | ICD-10-CM | POA: Diagnosis not present

## 2019-02-06 DIAGNOSIS — C220 Liver cell carcinoma: Secondary | ICD-10-CM | POA: Diagnosis not present

## 2019-02-07 ENCOUNTER — Telehealth: Payer: Self-pay

## 2019-02-07 DIAGNOSIS — J189 Pneumonia, unspecified organism: Secondary | ICD-10-CM | POA: Diagnosis not present

## 2019-02-07 DIAGNOSIS — C220 Liver cell carcinoma: Secondary | ICD-10-CM | POA: Diagnosis not present

## 2019-02-07 DIAGNOSIS — I679 Cerebrovascular disease, unspecified: Secondary | ICD-10-CM | POA: Diagnosis not present

## 2019-02-07 DIAGNOSIS — E46 Unspecified protein-calorie malnutrition: Secondary | ICD-10-CM | POA: Diagnosis not present

## 2019-02-07 DIAGNOSIS — I4891 Unspecified atrial fibrillation: Secondary | ICD-10-CM | POA: Diagnosis not present

## 2019-02-07 DIAGNOSIS — E785 Hyperlipidemia, unspecified: Secondary | ICD-10-CM | POA: Diagnosis not present

## 2019-02-07 NOTE — Telephone Encounter (Signed)
Faxed signed orders back to Hospice.

## 2019-02-08 DIAGNOSIS — I679 Cerebrovascular disease, unspecified: Secondary | ICD-10-CM | POA: Diagnosis not present

## 2019-02-08 DIAGNOSIS — I4891 Unspecified atrial fibrillation: Secondary | ICD-10-CM | POA: Diagnosis not present

## 2019-02-08 DIAGNOSIS — J189 Pneumonia, unspecified organism: Secondary | ICD-10-CM | POA: Diagnosis not present

## 2019-02-08 DIAGNOSIS — C220 Liver cell carcinoma: Secondary | ICD-10-CM | POA: Diagnosis not present

## 2019-02-08 DIAGNOSIS — E785 Hyperlipidemia, unspecified: Secondary | ICD-10-CM | POA: Diagnosis not present

## 2019-02-08 DIAGNOSIS — E46 Unspecified protein-calorie malnutrition: Secondary | ICD-10-CM | POA: Diagnosis not present

## 2019-02-09 DIAGNOSIS — J189 Pneumonia, unspecified organism: Secondary | ICD-10-CM | POA: Diagnosis not present

## 2019-02-09 DIAGNOSIS — C220 Liver cell carcinoma: Secondary | ICD-10-CM | POA: Diagnosis not present

## 2019-02-09 DIAGNOSIS — I4891 Unspecified atrial fibrillation: Secondary | ICD-10-CM | POA: Diagnosis not present

## 2019-02-09 DIAGNOSIS — I679 Cerebrovascular disease, unspecified: Secondary | ICD-10-CM | POA: Diagnosis not present

## 2019-02-09 DIAGNOSIS — E46 Unspecified protein-calorie malnutrition: Secondary | ICD-10-CM | POA: Diagnosis not present

## 2019-02-09 DIAGNOSIS — E785 Hyperlipidemia, unspecified: Secondary | ICD-10-CM | POA: Diagnosis not present

## 2019-02-12 ENCOUNTER — Telehealth: Payer: Self-pay

## 2019-02-12 DIAGNOSIS — E785 Hyperlipidemia, unspecified: Secondary | ICD-10-CM | POA: Diagnosis not present

## 2019-02-12 DIAGNOSIS — I679 Cerebrovascular disease, unspecified: Secondary | ICD-10-CM | POA: Diagnosis not present

## 2019-02-12 DIAGNOSIS — I4891 Unspecified atrial fibrillation: Secondary | ICD-10-CM | POA: Diagnosis not present

## 2019-02-12 DIAGNOSIS — E46 Unspecified protein-calorie malnutrition: Secondary | ICD-10-CM | POA: Diagnosis not present

## 2019-02-12 DIAGNOSIS — J189 Pneumonia, unspecified organism: Secondary | ICD-10-CM | POA: Diagnosis not present

## 2019-02-12 DIAGNOSIS — C220 Liver cell carcinoma: Secondary | ICD-10-CM | POA: Diagnosis not present

## 2019-02-12 NOTE — Telephone Encounter (Signed)
Faxed signed orders back to [facility] to fax [phone], sent to HIM for scan to chart.

## 2019-02-13 DIAGNOSIS — E46 Unspecified protein-calorie malnutrition: Secondary | ICD-10-CM | POA: Diagnosis not present

## 2019-02-13 DIAGNOSIS — J189 Pneumonia, unspecified organism: Secondary | ICD-10-CM | POA: Diagnosis not present

## 2019-02-13 DIAGNOSIS — C220 Liver cell carcinoma: Secondary | ICD-10-CM | POA: Diagnosis not present

## 2019-02-13 DIAGNOSIS — I679 Cerebrovascular disease, unspecified: Secondary | ICD-10-CM | POA: Diagnosis not present

## 2019-02-13 DIAGNOSIS — I4891 Unspecified atrial fibrillation: Secondary | ICD-10-CM | POA: Diagnosis not present

## 2019-02-13 DIAGNOSIS — E785 Hyperlipidemia, unspecified: Secondary | ICD-10-CM | POA: Diagnosis not present

## 2019-02-16 DIAGNOSIS — E785 Hyperlipidemia, unspecified: Secondary | ICD-10-CM | POA: Diagnosis not present

## 2019-02-16 DIAGNOSIS — C220 Liver cell carcinoma: Secondary | ICD-10-CM | POA: Diagnosis not present

## 2019-02-16 DIAGNOSIS — J189 Pneumonia, unspecified organism: Secondary | ICD-10-CM | POA: Diagnosis not present

## 2019-02-16 DIAGNOSIS — I679 Cerebrovascular disease, unspecified: Secondary | ICD-10-CM | POA: Diagnosis not present

## 2019-02-16 DIAGNOSIS — I4891 Unspecified atrial fibrillation: Secondary | ICD-10-CM | POA: Diagnosis not present

## 2019-02-16 DIAGNOSIS — E46 Unspecified protein-calorie malnutrition: Secondary | ICD-10-CM | POA: Diagnosis not present

## 2019-02-17 DIAGNOSIS — J189 Pneumonia, unspecified organism: Secondary | ICD-10-CM | POA: Diagnosis not present

## 2019-02-17 DIAGNOSIS — E785 Hyperlipidemia, unspecified: Secondary | ICD-10-CM | POA: Diagnosis not present

## 2019-02-17 DIAGNOSIS — I679 Cerebrovascular disease, unspecified: Secondary | ICD-10-CM | POA: Diagnosis not present

## 2019-02-17 DIAGNOSIS — I4891 Unspecified atrial fibrillation: Secondary | ICD-10-CM | POA: Diagnosis not present

## 2019-02-17 DIAGNOSIS — E46 Unspecified protein-calorie malnutrition: Secondary | ICD-10-CM | POA: Diagnosis not present

## 2019-02-17 DIAGNOSIS — C220 Liver cell carcinoma: Secondary | ICD-10-CM | POA: Diagnosis not present

## 2019-02-18 DIAGNOSIS — I679 Cerebrovascular disease, unspecified: Secondary | ICD-10-CM | POA: Diagnosis not present

## 2019-02-18 DIAGNOSIS — E46 Unspecified protein-calorie malnutrition: Secondary | ICD-10-CM | POA: Diagnosis not present

## 2019-02-18 DIAGNOSIS — J189 Pneumonia, unspecified organism: Secondary | ICD-10-CM | POA: Diagnosis not present

## 2019-02-18 DIAGNOSIS — I4891 Unspecified atrial fibrillation: Secondary | ICD-10-CM | POA: Diagnosis not present

## 2019-02-18 DIAGNOSIS — E785 Hyperlipidemia, unspecified: Secondary | ICD-10-CM | POA: Diagnosis not present

## 2019-02-18 DIAGNOSIS — C220 Liver cell carcinoma: Secondary | ICD-10-CM | POA: Diagnosis not present

## 2019-02-20 DIAGNOSIS — C220 Liver cell carcinoma: Secondary | ICD-10-CM | POA: Diagnosis not present

## 2019-02-20 DIAGNOSIS — E46 Unspecified protein-calorie malnutrition: Secondary | ICD-10-CM | POA: Diagnosis not present

## 2019-02-20 DIAGNOSIS — J189 Pneumonia, unspecified organism: Secondary | ICD-10-CM | POA: Diagnosis not present

## 2019-02-20 DIAGNOSIS — I679 Cerebrovascular disease, unspecified: Secondary | ICD-10-CM | POA: Diagnosis not present

## 2019-02-20 DIAGNOSIS — E785 Hyperlipidemia, unspecified: Secondary | ICD-10-CM | POA: Diagnosis not present

## 2019-02-20 DIAGNOSIS — I4891 Unspecified atrial fibrillation: Secondary | ICD-10-CM | POA: Diagnosis not present

## 2019-02-21 DIAGNOSIS — I679 Cerebrovascular disease, unspecified: Secondary | ICD-10-CM | POA: Diagnosis not present

## 2019-02-21 DIAGNOSIS — E785 Hyperlipidemia, unspecified: Secondary | ICD-10-CM | POA: Diagnosis not present

## 2019-02-21 DIAGNOSIS — E46 Unspecified protein-calorie malnutrition: Secondary | ICD-10-CM | POA: Diagnosis not present

## 2019-02-21 DIAGNOSIS — J189 Pneumonia, unspecified organism: Secondary | ICD-10-CM | POA: Diagnosis not present

## 2019-02-21 DIAGNOSIS — I4891 Unspecified atrial fibrillation: Secondary | ICD-10-CM | POA: Diagnosis not present

## 2019-02-21 DIAGNOSIS — C220 Liver cell carcinoma: Secondary | ICD-10-CM | POA: Diagnosis not present

## 2019-02-22 DIAGNOSIS — J189 Pneumonia, unspecified organism: Secondary | ICD-10-CM | POA: Diagnosis not present

## 2019-02-22 DIAGNOSIS — I4891 Unspecified atrial fibrillation: Secondary | ICD-10-CM | POA: Diagnosis not present

## 2019-02-22 DIAGNOSIS — E785 Hyperlipidemia, unspecified: Secondary | ICD-10-CM | POA: Diagnosis not present

## 2019-02-22 DIAGNOSIS — E46 Unspecified protein-calorie malnutrition: Secondary | ICD-10-CM | POA: Diagnosis not present

## 2019-02-22 DIAGNOSIS — I679 Cerebrovascular disease, unspecified: Secondary | ICD-10-CM | POA: Diagnosis not present

## 2019-02-22 DIAGNOSIS — C220 Liver cell carcinoma: Secondary | ICD-10-CM | POA: Diagnosis not present

## 2019-02-23 DIAGNOSIS — E46 Unspecified protein-calorie malnutrition: Secondary | ICD-10-CM | POA: Diagnosis not present

## 2019-02-23 DIAGNOSIS — I4891 Unspecified atrial fibrillation: Secondary | ICD-10-CM | POA: Diagnosis not present

## 2019-02-23 DIAGNOSIS — I679 Cerebrovascular disease, unspecified: Secondary | ICD-10-CM | POA: Diagnosis not present

## 2019-02-23 DIAGNOSIS — C220 Liver cell carcinoma: Secondary | ICD-10-CM | POA: Diagnosis not present

## 2019-02-23 DIAGNOSIS — J189 Pneumonia, unspecified organism: Secondary | ICD-10-CM | POA: Diagnosis not present

## 2019-02-23 DIAGNOSIS — E785 Hyperlipidemia, unspecified: Secondary | ICD-10-CM | POA: Diagnosis not present

## 2019-02-26 DIAGNOSIS — E785 Hyperlipidemia, unspecified: Secondary | ICD-10-CM | POA: Diagnosis not present

## 2019-02-26 DIAGNOSIS — I679 Cerebrovascular disease, unspecified: Secondary | ICD-10-CM | POA: Diagnosis not present

## 2019-02-26 DIAGNOSIS — I4891 Unspecified atrial fibrillation: Secondary | ICD-10-CM | POA: Diagnosis not present

## 2019-02-26 DIAGNOSIS — E46 Unspecified protein-calorie malnutrition: Secondary | ICD-10-CM | POA: Diagnosis not present

## 2019-02-26 DIAGNOSIS — C220 Liver cell carcinoma: Secondary | ICD-10-CM | POA: Diagnosis not present

## 2019-02-26 DIAGNOSIS — J189 Pneumonia, unspecified organism: Secondary | ICD-10-CM | POA: Diagnosis not present

## 2019-02-27 DIAGNOSIS — E46 Unspecified protein-calorie malnutrition: Secondary | ICD-10-CM | POA: Diagnosis not present

## 2019-02-27 DIAGNOSIS — I4891 Unspecified atrial fibrillation: Secondary | ICD-10-CM | POA: Diagnosis not present

## 2019-02-27 DIAGNOSIS — C220 Liver cell carcinoma: Secondary | ICD-10-CM | POA: Diagnosis not present

## 2019-02-27 DIAGNOSIS — J189 Pneumonia, unspecified organism: Secondary | ICD-10-CM | POA: Diagnosis not present

## 2019-02-27 DIAGNOSIS — I679 Cerebrovascular disease, unspecified: Secondary | ICD-10-CM | POA: Diagnosis not present

## 2019-02-27 DIAGNOSIS — E785 Hyperlipidemia, unspecified: Secondary | ICD-10-CM | POA: Diagnosis not present

## 2019-02-28 DIAGNOSIS — E46 Unspecified protein-calorie malnutrition: Secondary | ICD-10-CM | POA: Diagnosis not present

## 2019-02-28 DIAGNOSIS — J189 Pneumonia, unspecified organism: Secondary | ICD-10-CM | POA: Diagnosis not present

## 2019-02-28 DIAGNOSIS — I4891 Unspecified atrial fibrillation: Secondary | ICD-10-CM | POA: Diagnosis not present

## 2019-02-28 DIAGNOSIS — I679 Cerebrovascular disease, unspecified: Secondary | ICD-10-CM | POA: Diagnosis not present

## 2019-02-28 DIAGNOSIS — C220 Liver cell carcinoma: Secondary | ICD-10-CM | POA: Diagnosis not present

## 2019-02-28 DIAGNOSIS — E785 Hyperlipidemia, unspecified: Secondary | ICD-10-CM | POA: Diagnosis not present

## 2019-03-01 DIAGNOSIS — J189 Pneumonia, unspecified organism: Secondary | ICD-10-CM | POA: Diagnosis not present

## 2019-03-01 DIAGNOSIS — I679 Cerebrovascular disease, unspecified: Secondary | ICD-10-CM | POA: Diagnosis not present

## 2019-03-01 DIAGNOSIS — I4891 Unspecified atrial fibrillation: Secondary | ICD-10-CM | POA: Diagnosis not present

## 2019-03-01 DIAGNOSIS — C220 Liver cell carcinoma: Secondary | ICD-10-CM | POA: Diagnosis not present

## 2019-03-01 DIAGNOSIS — E46 Unspecified protein-calorie malnutrition: Secondary | ICD-10-CM | POA: Diagnosis not present

## 2019-03-01 DIAGNOSIS — E785 Hyperlipidemia, unspecified: Secondary | ICD-10-CM | POA: Diagnosis not present

## 2019-03-02 DIAGNOSIS — I679 Cerebrovascular disease, unspecified: Secondary | ICD-10-CM | POA: Diagnosis not present

## 2019-03-02 DIAGNOSIS — J189 Pneumonia, unspecified organism: Secondary | ICD-10-CM | POA: Diagnosis not present

## 2019-03-02 DIAGNOSIS — I4891 Unspecified atrial fibrillation: Secondary | ICD-10-CM | POA: Diagnosis not present

## 2019-03-02 DIAGNOSIS — E46 Unspecified protein-calorie malnutrition: Secondary | ICD-10-CM | POA: Diagnosis not present

## 2019-03-02 DIAGNOSIS — C220 Liver cell carcinoma: Secondary | ICD-10-CM | POA: Diagnosis not present

## 2019-03-02 DIAGNOSIS — E785 Hyperlipidemia, unspecified: Secondary | ICD-10-CM | POA: Diagnosis not present

## 2019-03-03 DIAGNOSIS — I4891 Unspecified atrial fibrillation: Secondary | ICD-10-CM | POA: Diagnosis not present

## 2019-03-03 DIAGNOSIS — E46 Unspecified protein-calorie malnutrition: Secondary | ICD-10-CM | POA: Diagnosis not present

## 2019-03-03 DIAGNOSIS — J189 Pneumonia, unspecified organism: Secondary | ICD-10-CM | POA: Diagnosis not present

## 2019-03-03 DIAGNOSIS — E785 Hyperlipidemia, unspecified: Secondary | ICD-10-CM | POA: Diagnosis not present

## 2019-03-03 DIAGNOSIS — I679 Cerebrovascular disease, unspecified: Secondary | ICD-10-CM | POA: Diagnosis not present

## 2019-03-03 DIAGNOSIS — C220 Liver cell carcinoma: Secondary | ICD-10-CM | POA: Diagnosis not present

## 2019-03-04 DIAGNOSIS — I2510 Atherosclerotic heart disease of native coronary artery without angina pectoris: Secondary | ICD-10-CM | POA: Diagnosis not present

## 2019-03-04 DIAGNOSIS — C220 Liver cell carcinoma: Secondary | ICD-10-CM | POA: Diagnosis not present

## 2019-03-04 DIAGNOSIS — J189 Pneumonia, unspecified organism: Secondary | ICD-10-CM | POA: Diagnosis not present

## 2019-03-04 DIAGNOSIS — Z7401 Bed confinement status: Secondary | ICD-10-CM | POA: Diagnosis not present

## 2019-03-04 DIAGNOSIS — R112 Nausea with vomiting, unspecified: Secondary | ICD-10-CM | POA: Diagnosis not present

## 2019-03-04 DIAGNOSIS — I679 Cerebrovascular disease, unspecified: Secondary | ICD-10-CM | POA: Diagnosis not present

## 2019-03-04 DIAGNOSIS — R64 Cachexia: Secondary | ICD-10-CM | POA: Diagnosis not present

## 2019-03-04 DIAGNOSIS — E785 Hyperlipidemia, unspecified: Secondary | ICD-10-CM | POA: Diagnosis not present

## 2019-03-04 DIAGNOSIS — E46 Unspecified protein-calorie malnutrition: Secondary | ICD-10-CM | POA: Diagnosis not present

## 2019-03-04 DIAGNOSIS — E039 Hypothyroidism, unspecified: Secondary | ICD-10-CM | POA: Diagnosis not present

## 2019-03-04 DIAGNOSIS — I4891 Unspecified atrial fibrillation: Secondary | ICD-10-CM | POA: Diagnosis not present

## 2019-03-04 DIAGNOSIS — K2100 Gastro-esophageal reflux disease with esophagitis, without bleeding: Secondary | ICD-10-CM | POA: Diagnosis not present

## 2019-03-06 DIAGNOSIS — E46 Unspecified protein-calorie malnutrition: Secondary | ICD-10-CM | POA: Diagnosis not present

## 2019-03-06 DIAGNOSIS — J189 Pneumonia, unspecified organism: Secondary | ICD-10-CM | POA: Diagnosis not present

## 2019-03-06 DIAGNOSIS — I4891 Unspecified atrial fibrillation: Secondary | ICD-10-CM | POA: Diagnosis not present

## 2019-03-06 DIAGNOSIS — I679 Cerebrovascular disease, unspecified: Secondary | ICD-10-CM | POA: Diagnosis not present

## 2019-03-06 DIAGNOSIS — C220 Liver cell carcinoma: Secondary | ICD-10-CM | POA: Diagnosis not present

## 2019-03-06 DIAGNOSIS — E785 Hyperlipidemia, unspecified: Secondary | ICD-10-CM | POA: Diagnosis not present

## 2019-03-07 DIAGNOSIS — J189 Pneumonia, unspecified organism: Secondary | ICD-10-CM | POA: Diagnosis not present

## 2019-03-07 DIAGNOSIS — E46 Unspecified protein-calorie malnutrition: Secondary | ICD-10-CM | POA: Diagnosis not present

## 2019-03-07 DIAGNOSIS — I679 Cerebrovascular disease, unspecified: Secondary | ICD-10-CM | POA: Diagnosis not present

## 2019-03-07 DIAGNOSIS — E785 Hyperlipidemia, unspecified: Secondary | ICD-10-CM | POA: Diagnosis not present

## 2019-03-07 DIAGNOSIS — I4891 Unspecified atrial fibrillation: Secondary | ICD-10-CM | POA: Diagnosis not present

## 2019-03-07 DIAGNOSIS — C220 Liver cell carcinoma: Secondary | ICD-10-CM | POA: Diagnosis not present

## 2019-03-08 ENCOUNTER — Telehealth: Payer: Self-pay

## 2019-03-08 DIAGNOSIS — C220 Liver cell carcinoma: Secondary | ICD-10-CM | POA: Diagnosis not present

## 2019-03-08 DIAGNOSIS — E46 Unspecified protein-calorie malnutrition: Secondary | ICD-10-CM | POA: Diagnosis not present

## 2019-03-08 DIAGNOSIS — J189 Pneumonia, unspecified organism: Secondary | ICD-10-CM | POA: Diagnosis not present

## 2019-03-08 DIAGNOSIS — I4891 Unspecified atrial fibrillation: Secondary | ICD-10-CM | POA: Diagnosis not present

## 2019-03-08 DIAGNOSIS — E785 Hyperlipidemia, unspecified: Secondary | ICD-10-CM | POA: Diagnosis not present

## 2019-03-08 DIAGNOSIS — I679 Cerebrovascular disease, unspecified: Secondary | ICD-10-CM | POA: Diagnosis not present

## 2019-03-08 NOTE — Telephone Encounter (Signed)
Faxed signed orders back to Hospice, sent to [facility] for scanning to chart.

## 2019-03-09 DIAGNOSIS — E785 Hyperlipidemia, unspecified: Secondary | ICD-10-CM | POA: Diagnosis not present

## 2019-03-09 DIAGNOSIS — I679 Cerebrovascular disease, unspecified: Secondary | ICD-10-CM | POA: Diagnosis not present

## 2019-03-09 DIAGNOSIS — E46 Unspecified protein-calorie malnutrition: Secondary | ICD-10-CM | POA: Diagnosis not present

## 2019-03-09 DIAGNOSIS — J189 Pneumonia, unspecified organism: Secondary | ICD-10-CM | POA: Diagnosis not present

## 2019-03-09 DIAGNOSIS — I4891 Unspecified atrial fibrillation: Secondary | ICD-10-CM | POA: Diagnosis not present

## 2019-03-09 DIAGNOSIS — C220 Liver cell carcinoma: Secondary | ICD-10-CM | POA: Diagnosis not present

## 2019-03-10 DIAGNOSIS — C220 Liver cell carcinoma: Secondary | ICD-10-CM | POA: Diagnosis not present

## 2019-03-10 DIAGNOSIS — E46 Unspecified protein-calorie malnutrition: Secondary | ICD-10-CM | POA: Diagnosis not present

## 2019-03-10 DIAGNOSIS — E785 Hyperlipidemia, unspecified: Secondary | ICD-10-CM | POA: Diagnosis not present

## 2019-03-10 DIAGNOSIS — J189 Pneumonia, unspecified organism: Secondary | ICD-10-CM | POA: Diagnosis not present

## 2019-03-10 DIAGNOSIS — I4891 Unspecified atrial fibrillation: Secondary | ICD-10-CM | POA: Diagnosis not present

## 2019-03-10 DIAGNOSIS — I679 Cerebrovascular disease, unspecified: Secondary | ICD-10-CM | POA: Diagnosis not present

## 2019-03-13 DIAGNOSIS — E46 Unspecified protein-calorie malnutrition: Secondary | ICD-10-CM | POA: Diagnosis not present

## 2019-03-13 DIAGNOSIS — C220 Liver cell carcinoma: Secondary | ICD-10-CM | POA: Diagnosis not present

## 2019-03-13 DIAGNOSIS — J189 Pneumonia, unspecified organism: Secondary | ICD-10-CM | POA: Diagnosis not present

## 2019-03-13 DIAGNOSIS — I679 Cerebrovascular disease, unspecified: Secondary | ICD-10-CM | POA: Diagnosis not present

## 2019-03-13 DIAGNOSIS — I4891 Unspecified atrial fibrillation: Secondary | ICD-10-CM | POA: Diagnosis not present

## 2019-03-13 DIAGNOSIS — E785 Hyperlipidemia, unspecified: Secondary | ICD-10-CM | POA: Diagnosis not present

## 2019-03-15 DIAGNOSIS — I4891 Unspecified atrial fibrillation: Secondary | ICD-10-CM | POA: Diagnosis not present

## 2019-03-15 DIAGNOSIS — C220 Liver cell carcinoma: Secondary | ICD-10-CM | POA: Diagnosis not present

## 2019-03-15 DIAGNOSIS — E785 Hyperlipidemia, unspecified: Secondary | ICD-10-CM | POA: Diagnosis not present

## 2019-03-15 DIAGNOSIS — I679 Cerebrovascular disease, unspecified: Secondary | ICD-10-CM | POA: Diagnosis not present

## 2019-03-15 DIAGNOSIS — E46 Unspecified protein-calorie malnutrition: Secondary | ICD-10-CM | POA: Diagnosis not present

## 2019-03-15 DIAGNOSIS — J189 Pneumonia, unspecified organism: Secondary | ICD-10-CM | POA: Diagnosis not present

## 2019-03-18 DIAGNOSIS — I679 Cerebrovascular disease, unspecified: Secondary | ICD-10-CM | POA: Diagnosis not present

## 2019-03-18 DIAGNOSIS — I4891 Unspecified atrial fibrillation: Secondary | ICD-10-CM | POA: Diagnosis not present

## 2019-03-18 DIAGNOSIS — C220 Liver cell carcinoma: Secondary | ICD-10-CM | POA: Diagnosis not present

## 2019-03-18 DIAGNOSIS — J189 Pneumonia, unspecified organism: Secondary | ICD-10-CM | POA: Diagnosis not present

## 2019-03-18 DIAGNOSIS — E785 Hyperlipidemia, unspecified: Secondary | ICD-10-CM | POA: Diagnosis not present

## 2019-03-18 DIAGNOSIS — E46 Unspecified protein-calorie malnutrition: Secondary | ICD-10-CM | POA: Diagnosis not present

## 2019-03-19 DIAGNOSIS — E46 Unspecified protein-calorie malnutrition: Secondary | ICD-10-CM | POA: Diagnosis not present

## 2019-03-19 DIAGNOSIS — E785 Hyperlipidemia, unspecified: Secondary | ICD-10-CM | POA: Diagnosis not present

## 2019-03-19 DIAGNOSIS — I679 Cerebrovascular disease, unspecified: Secondary | ICD-10-CM | POA: Diagnosis not present

## 2019-03-19 DIAGNOSIS — C220 Liver cell carcinoma: Secondary | ICD-10-CM | POA: Diagnosis not present

## 2019-03-19 DIAGNOSIS — I4891 Unspecified atrial fibrillation: Secondary | ICD-10-CM | POA: Diagnosis not present

## 2019-03-19 DIAGNOSIS — J189 Pneumonia, unspecified organism: Secondary | ICD-10-CM | POA: Diagnosis not present

## 2019-03-20 DIAGNOSIS — C220 Liver cell carcinoma: Secondary | ICD-10-CM | POA: Diagnosis not present

## 2019-03-20 DIAGNOSIS — E785 Hyperlipidemia, unspecified: Secondary | ICD-10-CM | POA: Diagnosis not present

## 2019-03-20 DIAGNOSIS — J189 Pneumonia, unspecified organism: Secondary | ICD-10-CM | POA: Diagnosis not present

## 2019-03-20 DIAGNOSIS — E46 Unspecified protein-calorie malnutrition: Secondary | ICD-10-CM | POA: Diagnosis not present

## 2019-03-20 DIAGNOSIS — I679 Cerebrovascular disease, unspecified: Secondary | ICD-10-CM | POA: Diagnosis not present

## 2019-03-20 DIAGNOSIS — I4891 Unspecified atrial fibrillation: Secondary | ICD-10-CM | POA: Diagnosis not present

## 2019-03-21 ENCOUNTER — Telehealth: Payer: Self-pay

## 2019-03-21 NOTE — Telephone Encounter (Signed)
Faxed signed orders back to [facility], sent to HIM for scan to chart.

## 2019-04-03 DEATH — deceased

## 2019-07-26 ENCOUNTER — Telehealth: Payer: Self-pay

## 2019-07-26 NOTE — Telephone Encounter (Signed)
The pt wife states he passed away 18-Apr-2019 and she needs to return the monitor. I ordered the pt a return kit and told her it will be within 2-3 weeks to get the return kit.

## 2020-09-14 IMAGING — DX DG HIP (WITH OR WITHOUT PELVIS) 1V*L*
2 series · 2 of 2 positions shown · non-contrast
Comparison: No comparison.

CLINICAL DATA: Left hip replacement

EXAM:
DG HIP (WITH OR WITHOUT PELVIS) 1V*L*

[pelvis ap]
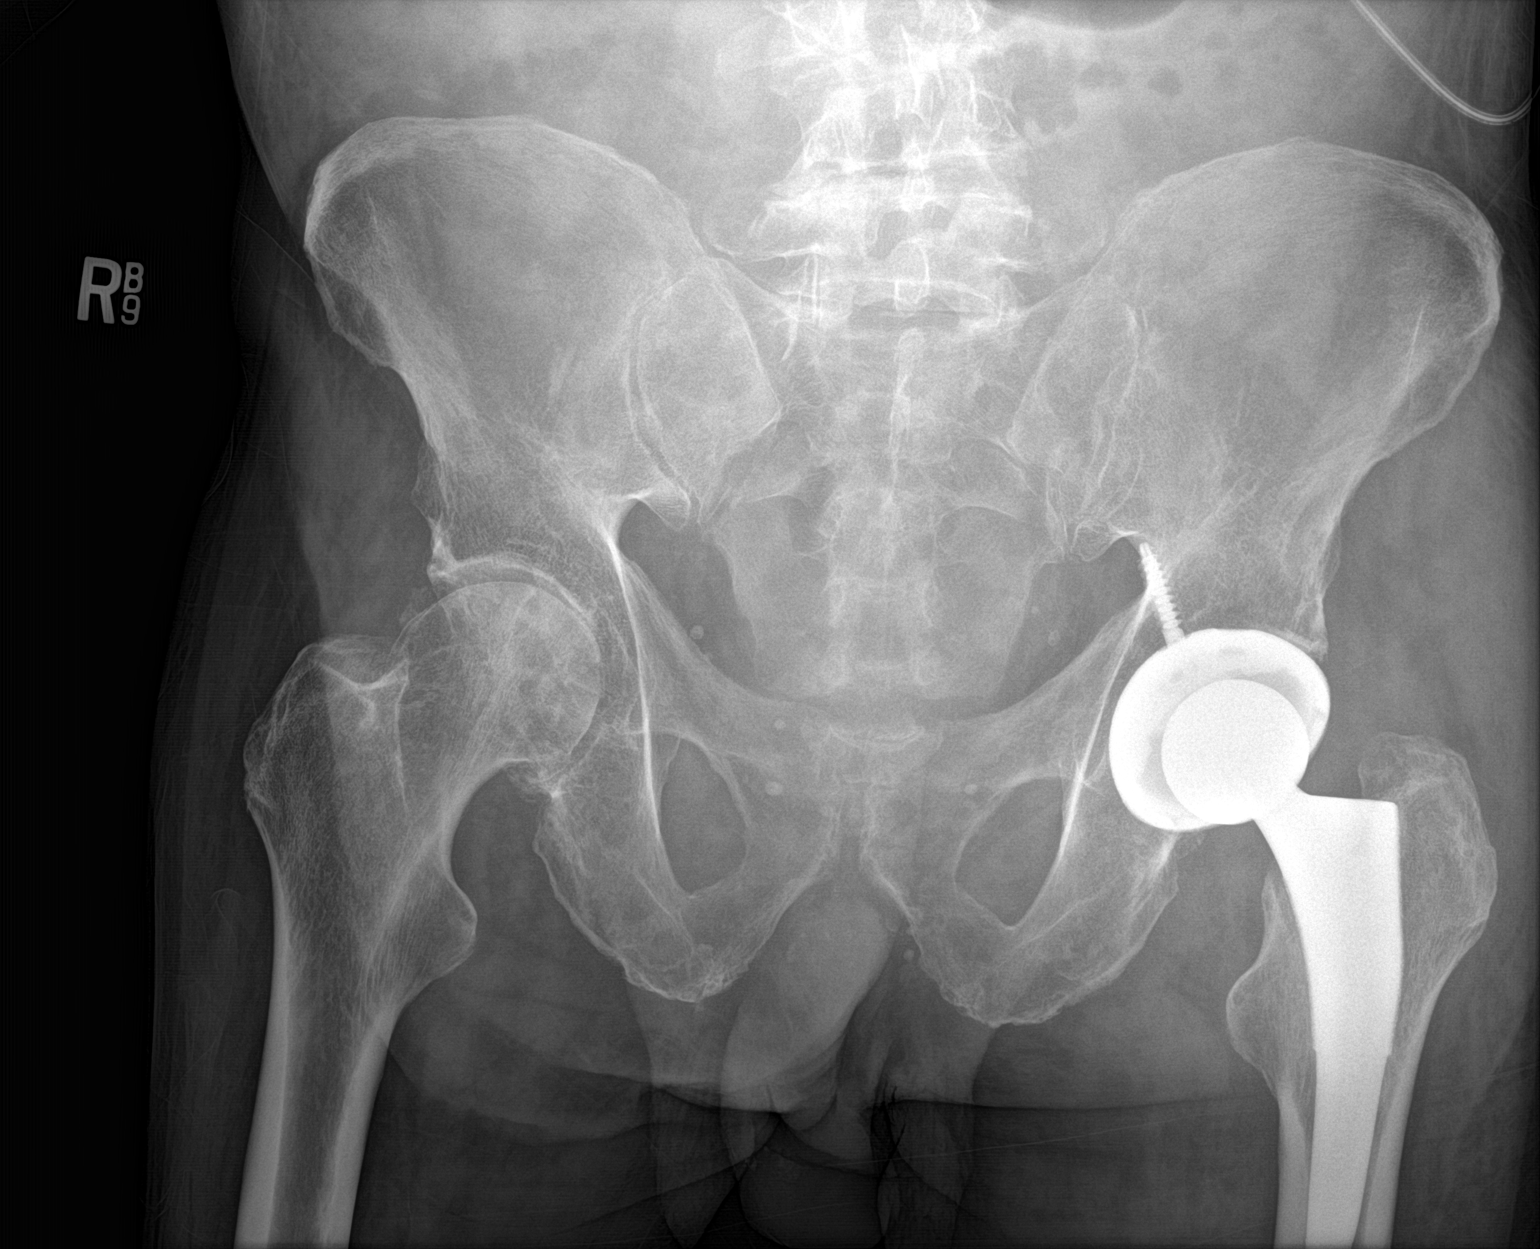

[hip ap]
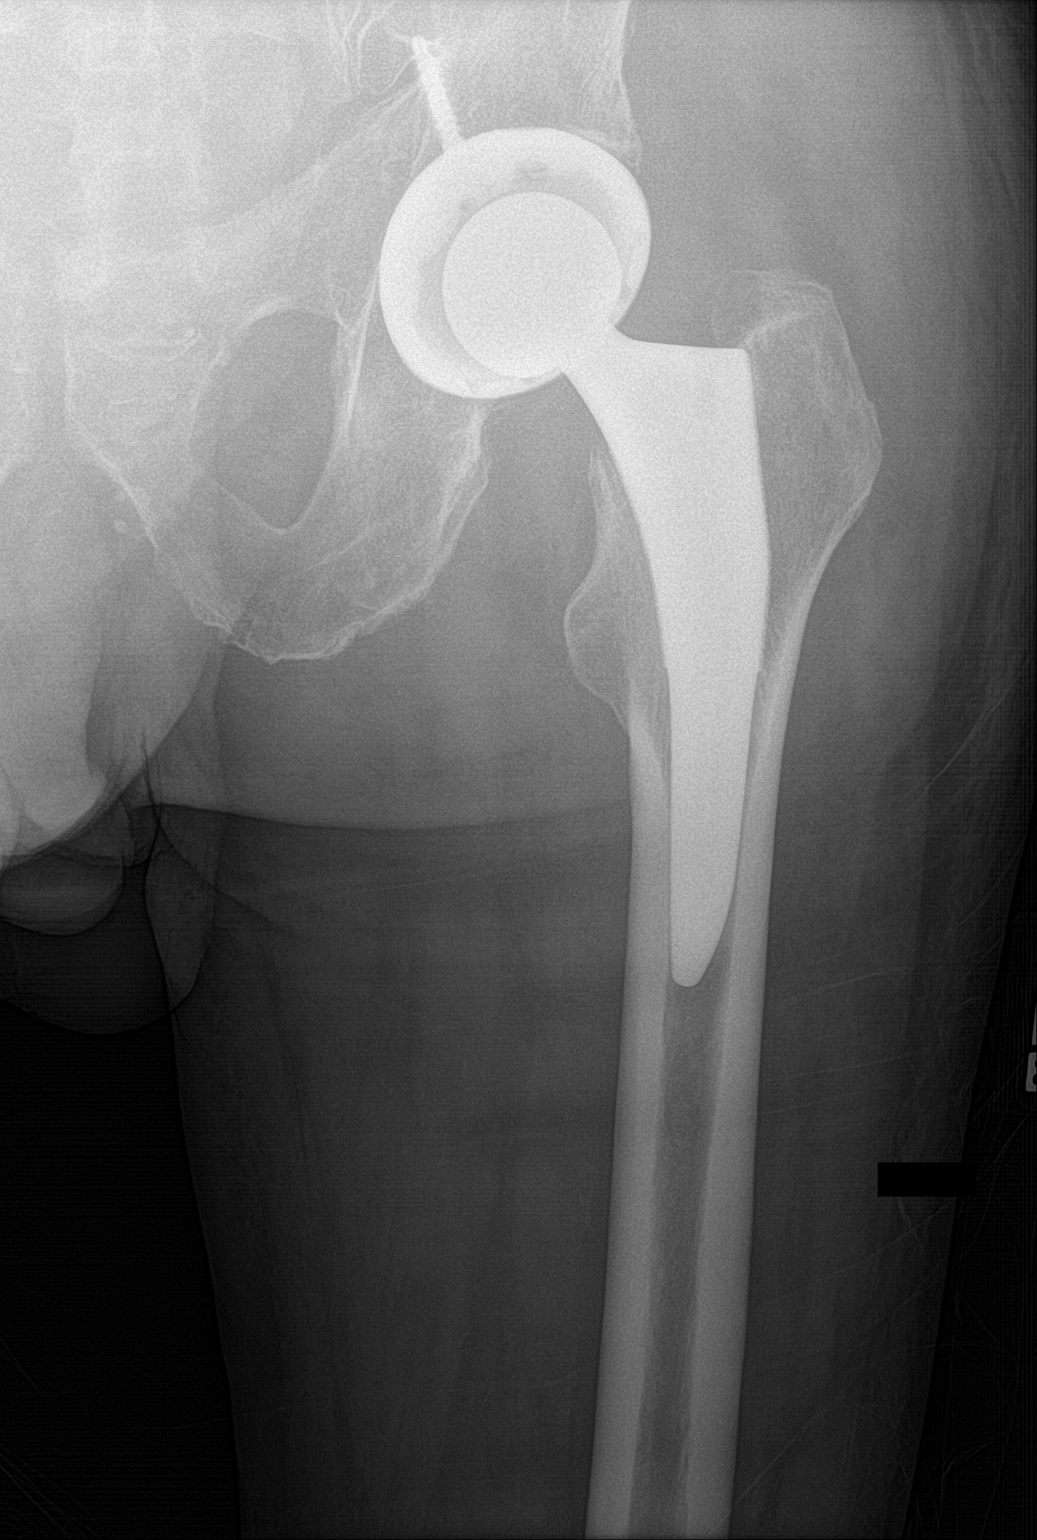

[2 of 2 positions shown; findings below may reference images not displayed]

FINDINGS: Bones are diffusely demineralized. Patient is status post left total
hip arthroplasty. Degenerative changes noted in the right hip.
IMPRESSION: No acute findings.

## 2020-09-14 IMAGING — CT CT ABDOMEN AND PELVIS WITH CONTRAST
2 of 5 series · 15 of 46 positions shown, 17 images · IV contrast (APPLIED)
Comparison: CT scan of October 16, 2018.

CLINICAL DATA: Vomiting.  Abdominal pain.

EXAM:
CT ABDOMEN AND PELVIS WITH CONTRAST
TECHNIQUE: Multidetector CT imaging of the abdomen and pelvis was performed
using the standard protocol following bolus administration of
intravenous contrast.
CONTRAST:  100mL OMNIPAQUE IOHEXOL 300 MG/ML  SOLN

[Series 2: axial (person_name) (person_name) · axial · 0.79mm/px · z∈[-740,-290]mm · 12 of 104 slices shown, 14 images]
[im 7/104  soft-tissue]
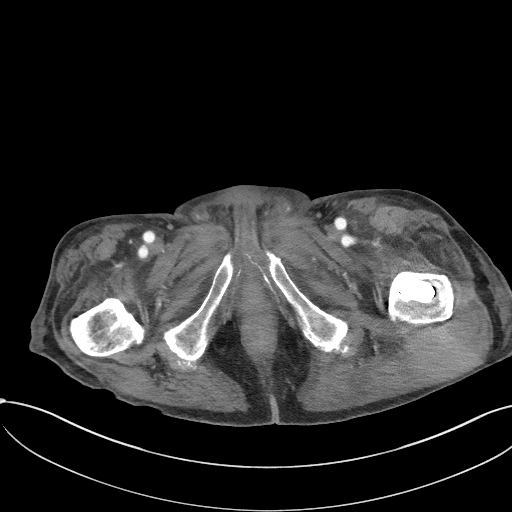
[im 7/104  bone]
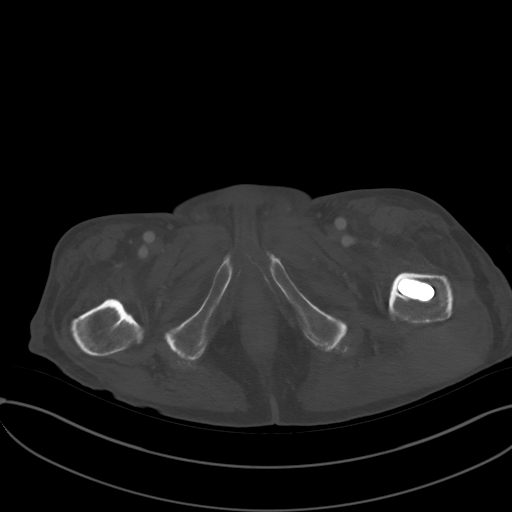
[im 19/104  soft-tissue]
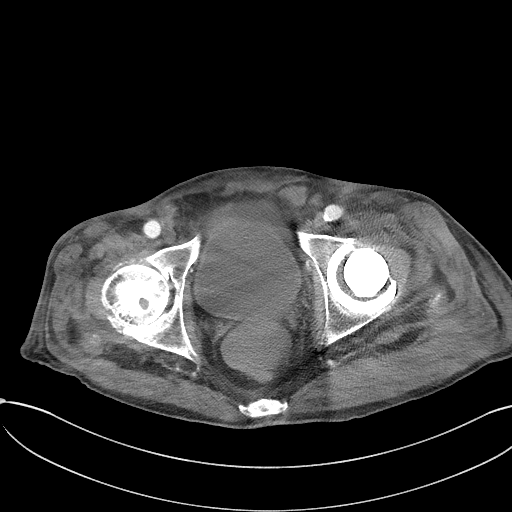
[im 25/104  soft-tissue]
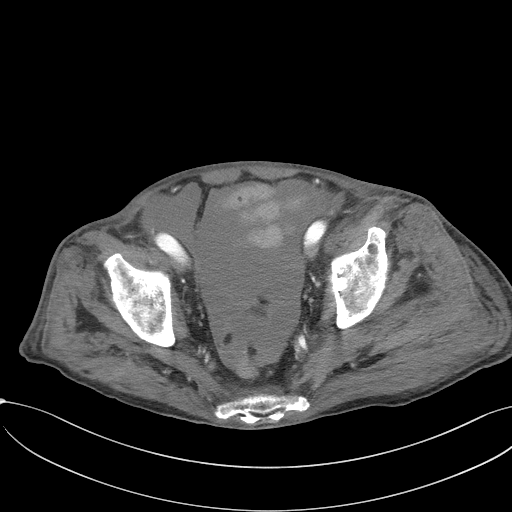
[im 31/104  soft-tissue]
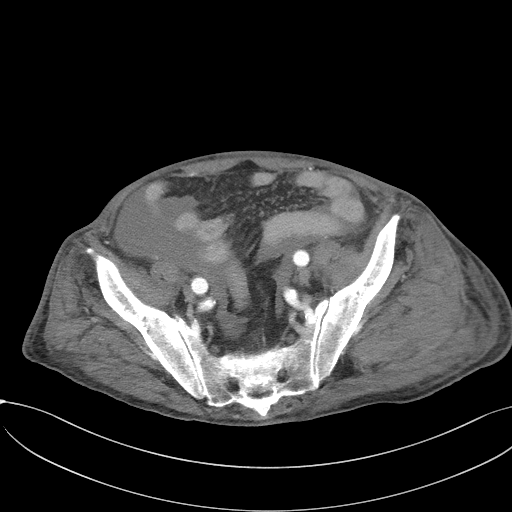
[im 43/104  soft-tissue]
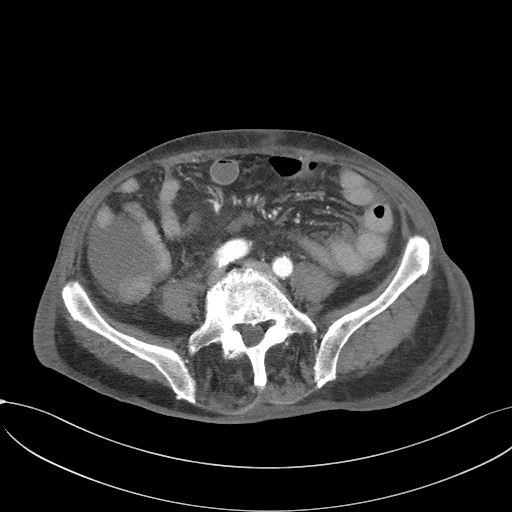
[im 49/104  soft-tissue]
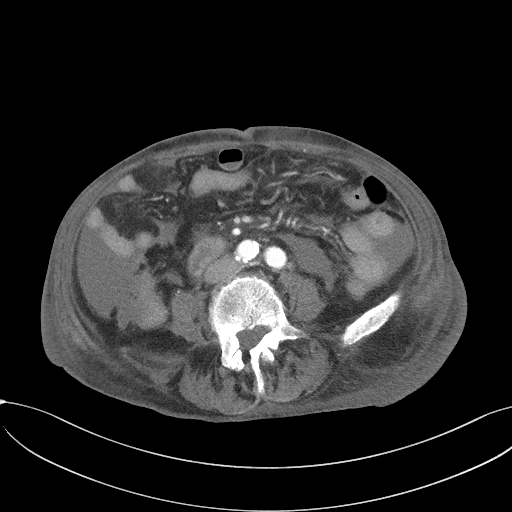
[im 55/104  soft-tissue]
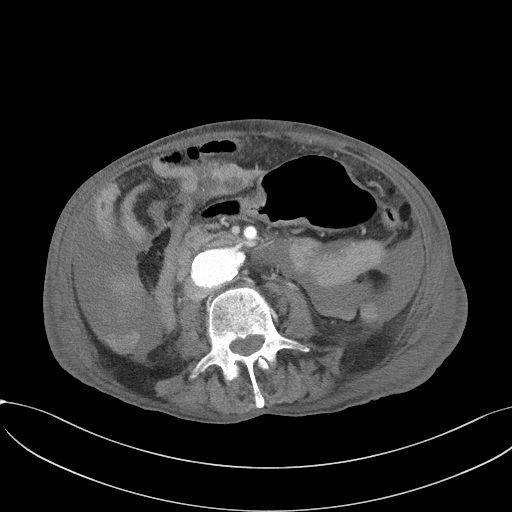
[im 67/104  soft-tissue]
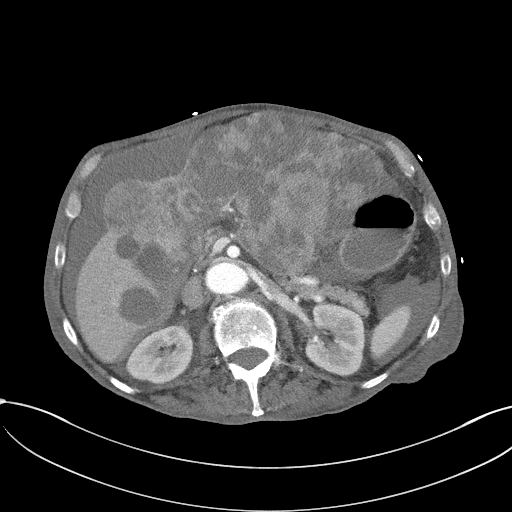
[im 73/104  soft-tissue]
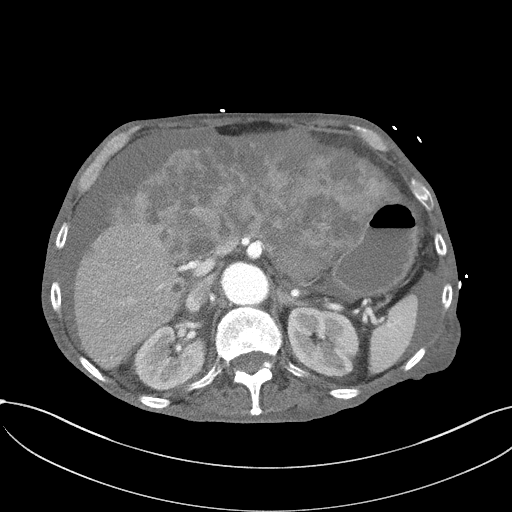
[im 73/104  bone]
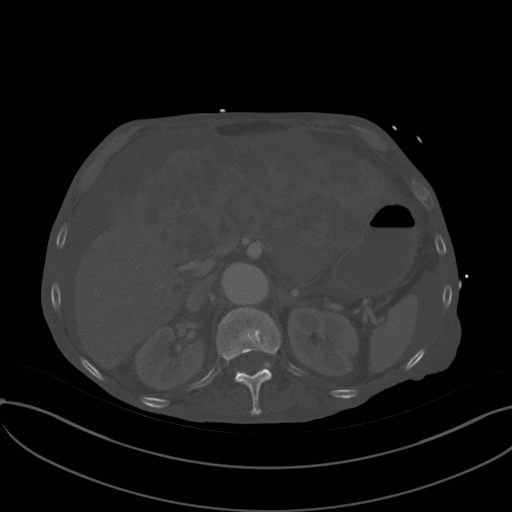
[im 79/104  soft-tissue]
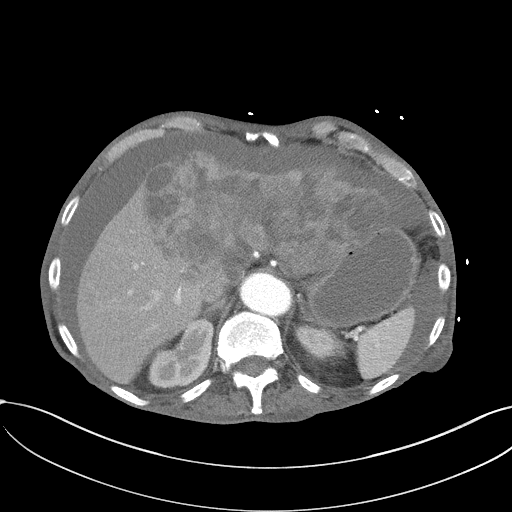
[im 91/104  soft-tissue]
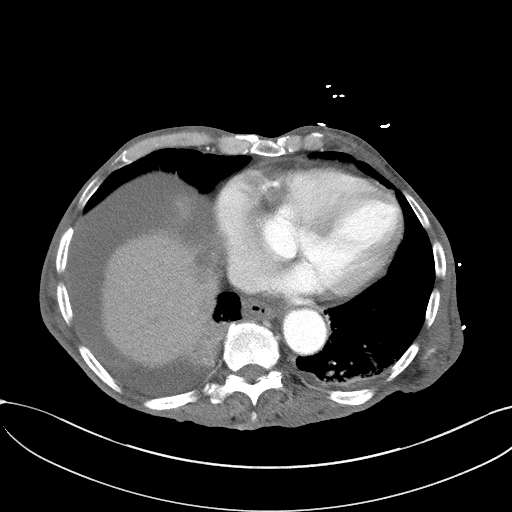
[im 97/104  soft-tissue]
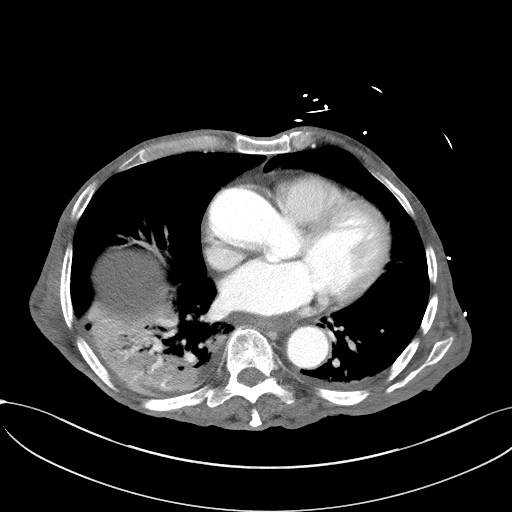

[Series 5: coronal st · coronal · 0.70mm/px · 3 of 89 slices shown]
[im 30/89  soft-tissue]
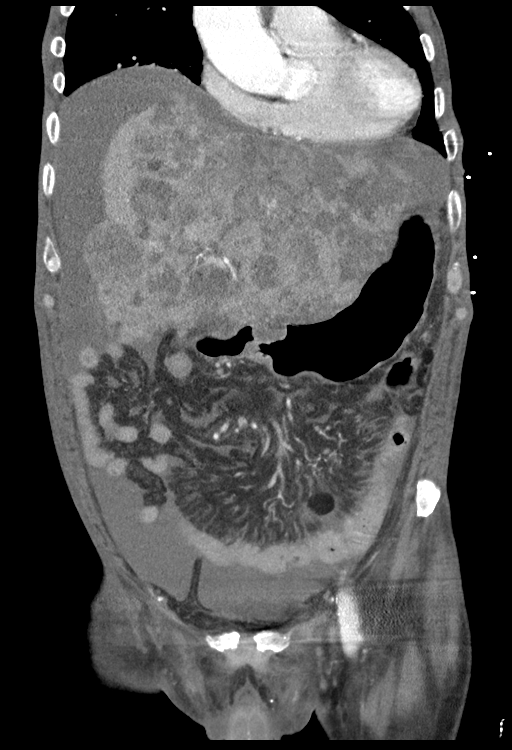
[im 40/89  soft-tissue]
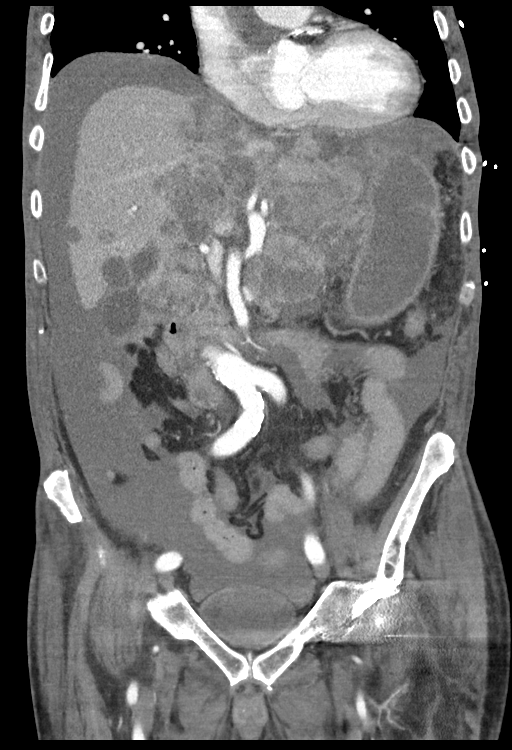
[im 49/89  soft-tissue]
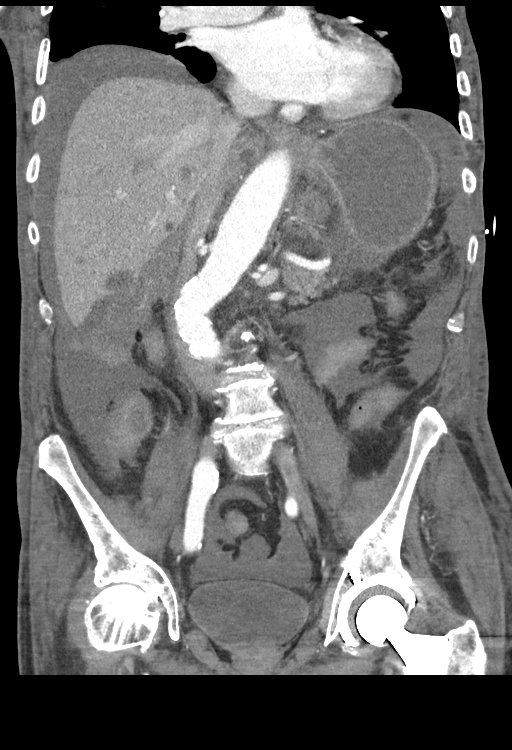

[15 of 46 positions shown; findings below may reference images not displayed]

FINDINGS: Lower chest: Small right pleural effusion is noted with adjacent
right lower lobe atelectasis or pneumonia.

Hepatobiliary: Innumerable malignant or metastatic lesions are seen
involving both hepatic lobes, but most predominantly the left
hepatic lobe and anterior segment of right hepatic lobe. Index
lesion is noted anterior portion of right hepatic lobe that measures
8.7 x 8.8 cm, which is not significantly changed compared to prior
exam. No biliary dilatation or cholelithiasis is noted.

Pancreas: Unremarkable. No pancreatic ductal dilatation or
surrounding inflammatory changes.

Spleen: Normal in size without focal abnormality.

Adrenals/Urinary Tract: Adrenal glands are unremarkable. Kidneys are
normal, without renal calculi, focal lesion, or hydronephrosis.
Bladder is unremarkable.

Stomach/Bowel: Stomach is within normal limits. Appendix appears
normal. No evidence of bowel wall thickening, distention, or
inflammatory changes.

Vascular/Lymphatic: Aortic atherosclerosis. 16 mm aortocaval lymph
node is noted which is not significantly changed compared to prior
exam.

Reproductive: Prostate is unremarkable.

Other: Moderate ascites is noted.  No hernia is noted.

Musculoskeletal: No acute or significant osseous findings.
IMPRESSION: Small right pleural effusion is noted with adjacent right lower lobe
atelectasis or pneumonia.

Stable diffuse hepatic lesions are noted consistent with metastatic
disease or malignancy. Stable 16 mm aortocaval lymph node.

Moderate ascites.

Aortic Atherosclerosis ([id number]).

## 2023-03-23 NOTE — Telephone Encounter (Signed)
Telephone call
